# Patient Record
Sex: Female | Born: 1965 | Race: White | Hispanic: No | Smoking: Current every day smoker
Health system: Southern US, Community
[De-identification: ages and names within clinical notes are randomized; demographics above are authoritative.]

## PROBLEM LIST (undated history)

## (undated) DIAGNOSIS — I10 Essential (primary) hypertension: Secondary | ICD-10-CM

## (undated) DIAGNOSIS — J40 Bronchitis, not specified as acute or chronic: Secondary | ICD-10-CM

## (undated) DIAGNOSIS — R748 Abnormal levels of other serum enzymes: Secondary | ICD-10-CM

## (undated) DIAGNOSIS — J449 Chronic obstructive pulmonary disease, unspecified: Secondary | ICD-10-CM

## (undated) DIAGNOSIS — I219 Acute myocardial infarction, unspecified: Secondary | ICD-10-CM

## (undated) DIAGNOSIS — I2510 Atherosclerotic heart disease of native coronary artery without angina pectoris: Secondary | ICD-10-CM

## (undated) DIAGNOSIS — F32A Depression, unspecified: Secondary | ICD-10-CM

## (undated) DIAGNOSIS — G8929 Other chronic pain: Secondary | ICD-10-CM

## (undated) DIAGNOSIS — J45909 Unspecified asthma, uncomplicated: Secondary | ICD-10-CM

## (undated) DIAGNOSIS — I471 Supraventricular tachycardia, unspecified: Secondary | ICD-10-CM

## (undated) DIAGNOSIS — R0789 Other chest pain: Secondary | ICD-10-CM

## (undated) DIAGNOSIS — I779 Disorder of arteries and arterioles, unspecified: Secondary | ICD-10-CM

## (undated) DIAGNOSIS — R002 Palpitations: Secondary | ICD-10-CM

## (undated) DIAGNOSIS — K7460 Unspecified cirrhosis of liver: Secondary | ICD-10-CM

## (undated) DIAGNOSIS — I2699 Other pulmonary embolism without acute cor pulmonale: Secondary | ICD-10-CM

## (undated) DIAGNOSIS — Z720 Tobacco use: Secondary | ICD-10-CM

## (undated) DIAGNOSIS — C189 Malignant neoplasm of colon, unspecified: Secondary | ICD-10-CM

## (undated) DIAGNOSIS — F329 Major depressive disorder, single episode, unspecified: Secondary | ICD-10-CM

## (undated) DIAGNOSIS — C801 Malignant (primary) neoplasm, unspecified: Secondary | ICD-10-CM

## (undated) DIAGNOSIS — I480 Paroxysmal atrial fibrillation: Secondary | ICD-10-CM

## (undated) DIAGNOSIS — E785 Hyperlipidemia, unspecified: Secondary | ICD-10-CM

## (undated) DIAGNOSIS — B1910 Unspecified viral hepatitis B without hepatic coma: Secondary | ICD-10-CM

## (undated) DIAGNOSIS — F419 Anxiety disorder, unspecified: Secondary | ICD-10-CM

## (undated) DIAGNOSIS — K219 Gastro-esophageal reflux disease without esophagitis: Secondary | ICD-10-CM

## (undated) DIAGNOSIS — I639 Cerebral infarction, unspecified: Secondary | ICD-10-CM

## (undated) DIAGNOSIS — R011 Cardiac murmur, unspecified: Secondary | ICD-10-CM

## (undated) DIAGNOSIS — Z9289 Personal history of other medical treatment: Secondary | ICD-10-CM

## (undated) HISTORY — DX: Gastro-esophageal reflux disease without esophagitis: K21.9

## (undated) HISTORY — DX: Cerebral infarction, unspecified: I63.9

## (undated) HISTORY — DX: Major depressive disorder, single episode, unspecified: F32.9

## (undated) HISTORY — DX: Other chronic pain: G89.29

## (undated) HISTORY — DX: Cardiac murmur, unspecified: R01.1

## (undated) HISTORY — DX: Anxiety disorder, unspecified: F41.9

## (undated) HISTORY — DX: Unspecified cirrhosis of liver: K74.60

## (undated) HISTORY — PX: BRONCHOSCOPY: SUR163

## (undated) HISTORY — DX: Unspecified viral hepatitis B without hepatic coma: B19.10

## (undated) HISTORY — DX: Disorder of arteries and arterioles, unspecified: I77.9

## (undated) HISTORY — DX: Other pulmonary embolism without acute cor pulmonale: I26.99

## (undated) HISTORY — DX: Essential (primary) hypertension: I10

## (undated) HISTORY — DX: Chronic obstructive pulmonary disease, unspecified: J44.9

## (undated) HISTORY — PX: APPENDECTOMY: SHX54

## (undated) HISTORY — DX: Supraventricular tachycardia, unspecified: I47.10

## (undated) HISTORY — DX: Hyperlipidemia, unspecified: E78.5

## (undated) HISTORY — DX: Tobacco use: Z72.0

## (undated) HISTORY — DX: Paroxysmal atrial fibrillation: I48.0

## (undated) HISTORY — PX: CARDIAC CATHETERIZATION: SHX172

## (undated) HISTORY — DX: Depression, unspecified: F32.A

## (undated) HISTORY — DX: Unspecified asthma, uncomplicated: J45.909

---
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
## 2004-07-31 ENCOUNTER — Ambulatory Visit: Payer: Self-pay | Admitting: Family Medicine

## 2005-05-13 ENCOUNTER — Ambulatory Visit: Payer: Self-pay | Admitting: Family Medicine

## 2005-05-26 ENCOUNTER — Ambulatory Visit: Payer: Self-pay | Admitting: Family Medicine

## 2005-07-28 ENCOUNTER — Emergency Department (HOSPITAL_COMMUNITY): Admission: EM | Admit: 2005-07-28 | Discharge: 2005-07-29 | Payer: Self-pay | Admitting: Emergency Medicine

## 2006-01-09 ENCOUNTER — Ambulatory Visit: Payer: Self-pay | Admitting: Family Medicine

## 2006-04-14 ENCOUNTER — Inpatient Hospital Stay (HOSPITAL_COMMUNITY): Admission: EM | Admit: 2006-04-14 | Discharge: 2006-04-18 | Payer: Self-pay | Admitting: Emergency Medicine

## 2006-04-14 ENCOUNTER — Ambulatory Visit: Payer: Self-pay | Admitting: Pulmonary Disease

## 2006-04-14 ENCOUNTER — Ambulatory Visit: Payer: Self-pay | Admitting: Family Medicine

## 2006-04-14 ENCOUNTER — Ambulatory Visit: Payer: Self-pay | Admitting: Cardiology

## 2006-04-14 ENCOUNTER — Ambulatory Visit: Payer: Self-pay | Admitting: Infectious Diseases

## 2006-04-15 ENCOUNTER — Encounter: Payer: Self-pay | Admitting: Cardiology

## 2006-04-17 ENCOUNTER — Encounter (INDEPENDENT_AMBULATORY_CARE_PROVIDER_SITE_OTHER): Payer: Self-pay | Admitting: Specialist

## 2006-05-07 ENCOUNTER — Ambulatory Visit: Payer: Self-pay | Admitting: Internal Medicine

## 2006-05-28 ENCOUNTER — Ambulatory Visit: Payer: Self-pay | Admitting: Pulmonary Disease

## 2006-06-12 ENCOUNTER — Ambulatory Visit: Payer: Self-pay | Admitting: Pulmonary Disease

## 2006-06-25 ENCOUNTER — Ambulatory Visit: Payer: Self-pay | Admitting: Pulmonary Disease

## 2006-07-01 ENCOUNTER — Ambulatory Visit: Payer: Self-pay | Admitting: Pulmonary Disease

## 2006-08-18 ENCOUNTER — Ambulatory Visit: Payer: Self-pay | Admitting: Pulmonary Disease

## 2006-08-27 ENCOUNTER — Ambulatory Visit: Payer: Self-pay | Admitting: Pulmonary Disease

## 2006-09-02 ENCOUNTER — Ambulatory Visit: Payer: Self-pay | Admitting: Critical Care Medicine

## 2006-09-02 ENCOUNTER — Inpatient Hospital Stay (HOSPITAL_COMMUNITY): Admission: EM | Admit: 2006-09-02 | Discharge: 2006-09-05 | Payer: Self-pay | Admitting: Emergency Medicine

## 2006-10-08 ENCOUNTER — Ambulatory Visit: Payer: Self-pay | Admitting: Pulmonary Disease

## 2006-12-11 DIAGNOSIS — J310 Chronic rhinitis: Secondary | ICD-10-CM | POA: Insufficient documentation

## 2006-12-11 DIAGNOSIS — Z720 Tobacco use: Secondary | ICD-10-CM

## 2007-03-25 ENCOUNTER — Telehealth (INDEPENDENT_AMBULATORY_CARE_PROVIDER_SITE_OTHER): Payer: Self-pay | Admitting: *Deleted

## 2007-03-29 ENCOUNTER — Telehealth: Payer: Self-pay | Admitting: Critical Care Medicine

## 2007-04-28 ENCOUNTER — Encounter: Payer: Self-pay | Admitting: Pulmonary Disease

## 2007-05-17 ENCOUNTER — Emergency Department (HOSPITAL_COMMUNITY): Admission: EM | Admit: 2007-05-17 | Discharge: 2007-05-18 | Payer: Self-pay | Admitting: Emergency Medicine

## 2007-05-27 ENCOUNTER — Ambulatory Visit: Payer: Self-pay | Admitting: Pulmonary Disease

## 2007-05-27 DIAGNOSIS — J438 Other emphysema: Secondary | ICD-10-CM

## 2007-05-27 DIAGNOSIS — F411 Generalized anxiety disorder: Secondary | ICD-10-CM | POA: Insufficient documentation

## 2007-05-27 DIAGNOSIS — G8929 Other chronic pain: Secondary | ICD-10-CM

## 2007-07-27 ENCOUNTER — Telehealth (INDEPENDENT_AMBULATORY_CARE_PROVIDER_SITE_OTHER): Payer: Self-pay | Admitting: *Deleted

## 2007-07-29 ENCOUNTER — Telehealth (INDEPENDENT_AMBULATORY_CARE_PROVIDER_SITE_OTHER): Payer: Self-pay | Admitting: *Deleted

## 2007-09-17 ENCOUNTER — Telehealth (INDEPENDENT_AMBULATORY_CARE_PROVIDER_SITE_OTHER): Payer: Self-pay | Admitting: *Deleted

## 2007-10-04 ENCOUNTER — Encounter: Payer: Self-pay | Admitting: Pulmonary Disease

## 2007-10-07 ENCOUNTER — Telehealth (INDEPENDENT_AMBULATORY_CARE_PROVIDER_SITE_OTHER): Payer: Self-pay | Admitting: *Deleted

## 2007-10-08 ENCOUNTER — Telehealth: Payer: Self-pay | Admitting: Pulmonary Disease

## 2007-10-12 ENCOUNTER — Ambulatory Visit: Payer: Self-pay | Admitting: Pulmonary Disease

## 2007-10-15 ENCOUNTER — Telehealth: Payer: Self-pay | Admitting: Pulmonary Disease

## 2008-03-22 ENCOUNTER — Telehealth (INDEPENDENT_AMBULATORY_CARE_PROVIDER_SITE_OTHER): Payer: Self-pay | Admitting: *Deleted

## 2008-04-07 ENCOUNTER — Telehealth: Payer: Self-pay | Admitting: Critical Care Medicine

## 2008-04-07 ENCOUNTER — Ambulatory Visit: Payer: Self-pay | Admitting: Pulmonary Disease

## 2008-04-07 DIAGNOSIS — K219 Gastro-esophageal reflux disease without esophagitis: Secondary | ICD-10-CM

## 2008-04-07 DIAGNOSIS — R079 Chest pain, unspecified: Secondary | ICD-10-CM

## 2008-04-11 ENCOUNTER — Telehealth (INDEPENDENT_AMBULATORY_CARE_PROVIDER_SITE_OTHER): Payer: Self-pay | Admitting: *Deleted

## 2008-04-22 ENCOUNTER — Emergency Department (HOSPITAL_COMMUNITY): Admission: EM | Admit: 2008-04-22 | Discharge: 2008-04-22 | Payer: Self-pay | Admitting: Emergency Medicine

## 2008-05-25 ENCOUNTER — Telehealth (INDEPENDENT_AMBULATORY_CARE_PROVIDER_SITE_OTHER): Payer: Self-pay | Admitting: *Deleted

## 2008-08-04 ENCOUNTER — Telehealth: Payer: Self-pay | Admitting: Pulmonary Disease

## 2008-08-29 ENCOUNTER — Telehealth (INDEPENDENT_AMBULATORY_CARE_PROVIDER_SITE_OTHER): Payer: Self-pay | Admitting: *Deleted

## 2008-09-07 ENCOUNTER — Ambulatory Visit: Payer: Self-pay | Admitting: Pulmonary Disease

## 2008-09-19 ENCOUNTER — Emergency Department (HOSPITAL_COMMUNITY): Admission: EM | Admit: 2008-09-19 | Discharge: 2008-09-19 | Payer: Self-pay | Admitting: Emergency Medicine

## 2008-09-28 ENCOUNTER — Telehealth (INDEPENDENT_AMBULATORY_CARE_PROVIDER_SITE_OTHER): Payer: Self-pay | Admitting: *Deleted

## 2008-10-20 ENCOUNTER — Encounter: Payer: Self-pay | Admitting: Pulmonary Disease

## 2009-04-12 ENCOUNTER — Telehealth: Payer: Self-pay | Admitting: Pulmonary Disease

## 2009-04-25 ENCOUNTER — Ambulatory Visit: Payer: Self-pay | Admitting: Pulmonary Disease

## 2009-06-05 ENCOUNTER — Telehealth (INDEPENDENT_AMBULATORY_CARE_PROVIDER_SITE_OTHER): Payer: Self-pay | Admitting: *Deleted

## 2009-06-07 ENCOUNTER — Telehealth: Payer: Self-pay | Admitting: Pulmonary Disease

## 2009-06-18 ENCOUNTER — Telehealth (INDEPENDENT_AMBULATORY_CARE_PROVIDER_SITE_OTHER): Payer: Self-pay | Admitting: *Deleted

## 2009-06-18 ENCOUNTER — Telehealth: Payer: Self-pay | Admitting: Pulmonary Disease

## 2009-08-03 ENCOUNTER — Telehealth (INDEPENDENT_AMBULATORY_CARE_PROVIDER_SITE_OTHER): Payer: Self-pay | Admitting: *Deleted

## 2009-08-30 ENCOUNTER — Telehealth: Payer: Self-pay | Admitting: Pulmonary Disease

## 2009-10-26 ENCOUNTER — Ambulatory Visit: Payer: Self-pay | Admitting: Pulmonary Disease

## 2009-10-26 ENCOUNTER — Encounter: Payer: Self-pay | Admitting: Pulmonary Disease

## 2009-12-04 ENCOUNTER — Telehealth: Payer: Self-pay | Admitting: Pulmonary Disease

## 2010-01-14 ENCOUNTER — Encounter: Payer: Self-pay | Admitting: Pulmonary Disease

## 2010-01-14 ENCOUNTER — Ambulatory Visit: Payer: Self-pay | Admitting: Pulmonary Disease

## 2010-01-14 LAB — CONVERTED CEMR LAB
Anti Nuclear Antibody(ANA): NEGATIVE
IgE (Immunoglobulin E), Serum: 11.9 intl units/mL (ref 0.0–180.0)

## 2010-01-16 ENCOUNTER — Telehealth: Payer: Self-pay | Admitting: Pulmonary Disease

## 2010-01-18 ENCOUNTER — Ambulatory Visit: Payer: Self-pay | Admitting: Cardiology

## 2010-01-29 ENCOUNTER — Encounter: Payer: Self-pay | Admitting: Pulmonary Disease

## 2010-01-29 LAB — CONVERTED CEMR LAB
Albumin: 4.5 g/dL (ref 3.5–5.2)
Alkaline Phosphatase: 53 units/L (ref 39–117)
Basophils Absolute: 0 10*3/uL (ref 0.0–0.1)
Bilirubin, Direct: 0 mg/dL (ref 0.0–0.3)
CO2: 22 meq/L (ref 19–32)
Calcium: 10.1 mg/dL (ref 8.4–10.5)
Creatinine, Ser: 0.8 mg/dL (ref 0.4–1.2)
Eosinophils Absolute: 0.1 10*3/uL (ref 0.0–0.7)
GFR calc non Af Amer: 83.84 mL/min (ref >60.00)
Glucose, Bld: 100 mg/dL — ABNORMAL HIGH (ref 70–99)
Lymphocytes Relative: 20.7 % (ref 12.0–46.0)
MCHC: 34.3 g/dL (ref 30.0–36.0)
Monocytes Relative: 7.7 % (ref 3.0–12.0)
Neutrophils Relative %: 70.6 % (ref 43.0–77.0)
Nitrite: NEGATIVE
Platelets: 286 10*3/uL (ref 150.0–400.0)
RDW: 13.9 % (ref 11.5–14.6)
Specific Gravity, Urine: 1.015 (ref 1.000–1.030)
Total Bilirubin: 0.4 mg/dL (ref 0.3–1.2)
Total Protein, Urine: NEGATIVE mg/dL
pH: 5.5 (ref 5.0–8.0)

## 2010-01-31 ENCOUNTER — Telehealth (INDEPENDENT_AMBULATORY_CARE_PROVIDER_SITE_OTHER): Payer: Self-pay | Admitting: *Deleted

## 2010-02-05 ENCOUNTER — Telehealth (INDEPENDENT_AMBULATORY_CARE_PROVIDER_SITE_OTHER): Payer: Self-pay | Admitting: *Deleted

## 2010-02-11 ENCOUNTER — Ambulatory Visit
Admission: RE | Admit: 2010-02-11 | Discharge: 2010-02-11 | Payer: Self-pay | Source: Home / Self Care | Attending: Pulmonary Disease | Admitting: Pulmonary Disease

## 2010-02-21 ENCOUNTER — Telehealth: Payer: Self-pay | Admitting: Pulmonary Disease

## 2010-02-22 ENCOUNTER — Ambulatory Visit: Admit: 2010-02-22 | Payer: Self-pay | Admitting: Internal Medicine

## 2010-02-24 ENCOUNTER — Encounter: Payer: Self-pay | Admitting: Pulmonary Disease

## 2010-02-25 ENCOUNTER — Telehealth: Payer: Self-pay | Admitting: Internal Medicine

## 2010-03-01 ENCOUNTER — Telehealth: Payer: Self-pay | Admitting: Pulmonary Disease

## 2010-03-05 NOTE — Progress Notes (Signed)
Summary: rx  Phone Note Call from Patient Call back at [phone]   Caller: Patient Call For: Awanda Wilcock Reason for Call: Refill Medication, Talk to Nurse Summary of Call: pt would like to get a refill on her Tramadol for cough.  She has appt in March. [facility] Initial call taken by: Eugene Gavia,  April 12, 2009 11:56 AM  Follow-up for Phone Call        refill sent. pt advised must keep march appt for future refills. Carron Curie CMA  April 12, 2009 12:19 PM     New/Updated Medications: TRAMADOL HCL 50 MG TABS (TRAMADOL HCL) 2 tabs every 6 hours as needed Prescriptions: TRAMADOL HCL 50 MG TABS (TRAMADOL HCL) 2 tabs every 6 hours as needed  #30 x 0   Entered by:   Carron Curie CMA   Authorized by:   Coralyn Helling MD   Signed by:   Carron Curie CMA on 04/12/2009   Method used:   Electronically to        [facility] [phone]* (retail)       [address]       [place], [place]  [address]       Ph: [phone] or [phone]       Fax: [phone]   RxID:   [id number]

## 2010-03-05 NOTE — Progress Notes (Signed)
Summary: Refill request for Symbicort denial  Phone Note Refill Request Message from:  Fax from Pharmacy  Refills Requested: Medication #1:  SYMBICORT 160-4.5 MCG/ACT AERO two puffs two times a day   Last Refilled: 08/03/2009   Notes: #10.0 [facility] [address], [place] phone [phone] fax [phone] Pt No Showed last 2 scheduled appointments with [facility], Pt needs to come in for OV. I called to schedule appointment for pt. Pt would not come to phone having family member advise me she was sleep. Family member states he will have her call when she wakes up. Medication refill request denied at time. Faxed sent to pharmacy stating same. Zackery Barefoot CMA  August 30, 2009 12:18 PM

## 2010-03-05 NOTE — Progress Notes (Signed)
Summary: refill benzonatate  Phone Note Refill Request Call back at [facility] from:  Fax from Pharmacy on December 04, 2009 12:24 PM  Refills Requested: Medication #1:  BENZONATATE 200 MG CAPS one by mouth three times a day as needed.   Dosage confirmed as above?Dosage Confirmed   Supply Requested: 10days   Last Refilled: 11/04/2009 last seen by VS 9.23.11, next ov 12.12.11.  Dr. Craige Cotta, may this med be filled?  Initial call taken by: Boone Master CNA/MA,  December 04, 2009 12:25 PM  Follow-up for Phone Call        [facility] to refill.  Give 60 pills with 2 refills. Follow-up by: Coralyn Helling MD,  December 04, 2009 12:45 PM    Prescriptions: BENZONATATE 200 MG CAPS (BENZONATATE) one by mouth three times a day as needed  #60 x 2   Entered by:   Gweneth Dimitri RN   Authorized by:   Coralyn Helling MD   Signed by:   Gweneth Dimitri RN on 12/04/2009   Method used:   Electronically to        [facility] [phone]* (retail)       [address]       [place], [place]  [address]       Ph: [phone] or [phone]       Fax: [phone]   RxID:   [id number]

## 2010-03-05 NOTE — Progress Notes (Signed)
Summary: pain and dyspnea- advised call EMS  Phone Note Call from Patient   Caller: Patient Call For: SOOD Summary of Call: pt c/o lung "pain". wants to speak to nurse. i offered an appt tomorrow but pt says her car is in the shop. wanted "just to talk to nurse about this asap. [phone] Initial call taken by: Tivis Ringer, CNA,  Jun 18, 2009 5:15 PM  Follow-up for Phone Call        Called and spoke with pt.  She is c/o "lung pain"- states that the pain is severe and has been worsening over the past several days.  She states that she has also had some prod cough with green sputum and increased SOB.  Pt sounded in distress over the phone, crying, and having to stop and take breaks between words.  I advised needs eval asap.  Pt unable to come in for appt and has no showed for past 2 appts due to transportation issues.  I advised that she call 911 immediatley so that she can be evaluated.  Pt verbalized understanding and states that she will call  for EMS.  I advised her also that as soon as she is able to keep appt, she should call for ov here with [facility]. Follow-up by: Vernie Murders,  Jun 18, 2009 5:23 PM

## 2010-03-05 NOTE — Assessment & Plan Note (Signed)
Summary: rov/apc   Primary Provider/Referring Provider:  Dr. Lysbeth Galas  CC:  COPD.  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  She has been getting more cough.  Her breathing has gotten worse.  She says she is not smoking anymore.  She is still using bupropion.  She occasionally brings up sputum, and had some blood in her sputum last week.  She denies fever, but has been getting sweats.  She denies rashes or leg swelling.  She is using ventolin three times a day, and duoneb three times a day.  She is also using spriva.  CXR  Procedure date:  04/25/2009  Findings:       CHEST - 2 VIEW    Comparison: 09/19/2008    Findings: Heart size is normal.  The mediastinum is unremarkable.   Lungs are clear.  No effusions.  No bony abnormalities.    IMPRESSION:   Normal chest   Preventive Screening-Counseling & Management  Alcohol-Tobacco     Smoking Status: quit  Current Medications (verified): 1)  Symbicort 160-4.5 Mcg/act Aero (Budesonide-Formoterol Fumarate) .... Two Puffs Two Times A Day 2)  Singulair 10 Mg  Tabs (Montelukast Sodium) .... Take 1 Tab By Mouth At Bedtime 3)  Ventolin Hfa 108 (90 Base) Mcg/act Aers (Albuterol Sulfate) .... 2 Puffs Up To Four Times Daily As Needed 4)  Duoneb 2.5-0.5 Mg/81ml  Soln (Albuterol-Ipratropium) .... Three Times A Day 5)  Buproban 150 Mg Xr12h-Tab (Bupropion Hcl (Smoking Deter)) .... One By Mouth Once Daily For 3 Days, Then One By Mouth Two Times A Day 6)  Alprazolam 0.25 Mg Tabs (Alprazolam) .Marland Kitchen.. 1 By Mouth Two Times A Day As Needed Anxiety 7)  Tramadol Hcl 50 Mg Tabs (Tramadol Hcl) .... 2 Tabs Every 6 Hours As Needed 8)  Spiriva Handihaler 18 Mcg Caps (Tiotropium Bromide Monohydrate) .... Once Daily  Allergies (verified): 1)  ! Pcn  Past History:  Past Medical History: Reviewed history  from 05/27/2007 and no changes required. HTN COPD/Emphysema GERD Chronic Pain Tobacco Abuse Anxiety Echo 04/15/06 EF 65% with normal LV function  Past Surgical History: Reviewed history from 05/27/2007 and no changes required. Bronchoscopy 04/17/06  Vital Signs:  Patient profile:   45 year old female Height:      63 inches (160.02 cm) Weight:      140 pounds (63.64 kg) BMI:     24.89 O2 Sat:      100 % on Room air Temp:     98.6 degrees F (37.00 degrees C) oral Pulse rate:   89 / minute BP sitting:   128 / 74  (left arm) Cuff size:   regular  Vitals Entered By: Michel Bickers CMA (April 25, 2009 2:14 PM)  O2 Sat at Rest %:  100 O2 Flow:  Room air CC: COPD.  The patient c/o increased sob with exertion and at rest and prod and non-prod cough for 2 weeks.   Physical Exam  Nose:  no sinus tenderness, no nasal discharge Mouth:  no oral lesion Neck:  no JVD.  Tender on palpation over trapezius Chest Wall:  tender on palpation over clavicles Lungs:  prolonged exhalation, no wheezing or rales Heart:  regular rhythm, normal rate, and no murmurs.   Extremities:  minimal ankle edema Cervical Nodes:  no significant adenopathy Psych:  anxious.  Impression & Recommendations:  Problem # 1:  HEMOPTYSIS UNSPECIFIED (ICD-786.30) She likely has an acute bronchitis.  I will give her a course of antibiotics.  Her CXR was unremarkable.  I think her chest discomfort is related to her coughing.  Problem # 2:  COPD (ICD-496)  She is to continue on spiriva, and symbicort.  She is to continue as needed proair.  Since she is on spiriva, will stop duoneb and just have her use albuterol in her nebulizer.  Problem # 3:  TOBACCO ABUSE (ICD-305.1) Will continue bupropion for now.  This may also be helping her anxiety and depression to some degree.  Medications Added to Medication List This Visit: 1)  Spiriva Handihaler 18 Mcg Caps (Tiotropium bromide monohydrate) .... Once daily 2)   Albuterol Sulfate (2.5 Mg/59ml) 0.083% Nebu (Albuterol sulfate) .... One vial nebulized four times per day 3)  Zithromax Z-pak 250 Mg Tabs (Azithromycin) .... Use as directed  Complete Medication List: 1)  Spiriva Handihaler 18 Mcg Caps (Tiotropium bromide monohydrate) .... Once daily 2)  Symbicort 160-4.5 Mcg/act Aero (Budesonide-formoterol fumarate) .... Two puffs two times a day 3)  Singulair 10 Mg Tabs (Montelukast sodium) .... Take 1 tab by mouth at bedtime 4)  Ventolin Hfa 108 (90 Base) Mcg/act Aers (Albuterol sulfate) .... 2 puffs up to four times daily as needed 5)  Albuterol Sulfate (2.5 Mg/14ml) 0.083% Nebu (Albuterol sulfate) .... One vial nebulized four times per day 6)  Buproban 150 Mg Xr12h-tab (Bupropion hcl (smoking deter)) .... One by mouth once daily for 3 days, then one by mouth two times a day 7)  Alprazolam 0.25 Mg Tabs (Alprazolam) .Marland Kitchen.. 1 by mouth two times a day as needed anxiety 8)  Tramadol Hcl 50 Mg Tabs (Tramadol hcl) .... 2 tabs every 6 hours as needed 9)  Zithromax Z-pak 250 Mg Tabs (Azithromycin) .... Use as directed  Other Orders: T-2 View CXR (71020TC) Est. Patient Level IV (29562)  Patient Instructions: 1)  Zithromax 250 mg pills: 2 pills on first day, then 1 pill once daily for 4 days 2)  Change duoneb to albuterol in nebulizer, and can use up to four times per day 3)  Follow up in 2 to 3 months Prescriptions: ZITHROMAX Z-PAK 250 MG TABS (AZITHROMYCIN) use as directed  #1 x 0   Entered and Authorized by:   Coralyn Helling MD   Signed by:   Coralyn Helling MD on 04/25/2009   Method used:   Electronically to        [facility] [phone]* (retail)       [address]       [place], [place]  [address]       Ph: [phone] or [phone]       Fax: [phone]   RxID:   [id number] ALBUTEROL SULFATE (2.5 MG/3ML) 0.083% NEBU (ALBUTEROL SULFATE) one vial nebulized four times per day  #120 x 5   Entered and Authorized by:   Coralyn Helling MD   Signed by:   Coralyn Helling MD on 04/25/2009   Method used:   Electronically to        [facility] [phone]* (retail)       [address]       [place], [place]  [address]       Ph: [phone] or [phone]  Fax: [phone]   RxID:   [id number]

## 2010-03-05 NOTE — Progress Notes (Signed)
Summary: DENIED---Tramadol  Phone Note Refill Request Message from:  Fax from Pharmacy on August 03, 2009 4:10 PM  Refills Requested: Medication #1:  TRAMADOL HCL 50 MG TABS 2 tabs every 6 hours as needed   Dosage confirmed as above?Dosage Confirmed   Brand Name Necessary? No   Supply Requested: 1 month   Last Refilled: 07/04/2009   [facility] [place]  Method Requested: Electronic Next Appointment Scheduled: no pending appt Initial call taken by: Michel Bickers CMA,  August 03, 2009 4:12 PM  Follow-up for Phone Call        Rx denied because the patient needs an appointment. Form faxed back to the pharmacy with denial. Follow-up by: Michel Bickers CMA,  August 03, 2009 4:16 PM

## 2010-03-05 NOTE — Progress Notes (Signed)
Summary: nos appt  Phone Note Call from Patient   Caller: [email or web address]  Call For: Seila Liston Summary of Call: LMTCB x 2 for nos from 5/13. Initial call taken by: Darletta Moll,  Jun 18, 2009 3:09 PM

## 2010-03-05 NOTE — Progress Notes (Signed)
Summary: rsc nos appt  Phone Note Call from Patient   Caller: [email or web address]  Call For: Florenda Watt Summary of Call: Rsc 5/4 nos to 5/13 @ 10:45a. Initial call taken by: Darletta Moll,  Jun 07, 2009 3:27 PM

## 2010-03-05 NOTE — Progress Notes (Signed)
Summary: pain in lungs  Phone Note Call from Patient Call back at [phone]   Caller: Patient Call For: sood Reason for Call: Talk to Nurse Summary of Call: pt says her lungs hurt, x2 weeks.  Says since she quit smoking, hurt more, coughing really bad, congestion in lungs, can't get it up.   Initial call taken by: Eugene Gavia,  Jun 05, 2009 12:01 PM  Follow-up for Phone Call        called and spoke with pt.  offered her an appt today.  pt declined stating she has no ride.  offered appt tomorrow.  pt agreed.  scheduled pt to see VS tomorrow at 2:30pm.  Arman Filter LPN  Jun 05, 2540 12:17 PM

## 2010-03-05 NOTE — Assessment & Plan Note (Signed)
Summary: rov/per pt request/apc   Visit Type:  Follow-up Primary Solae Norling/Referring Makaylyn Sinyard:  Dr. Lysbeth Galas  CC:  COPD follow-up...the patient c/o increased SOB with exertion and at rest...dry cough...still smoking 1/4 ppd.  History of Present Illness: 46 year old hx of COPD/Emphysema, rhinitis, and continued tobacco abuse.  She has been getting more short of breath.  This has been worse when exposed to cleaning products, and strong fumes.  She has been trying to walk in the park, but gets winded after walking 100 ft.  She has a cough which is usually dry.  She occasional brings up some sputum with blood mixed in.  She has been getting wheeze in her chest.  She feels like it is hard to get air into her lungs, and gets tightness in her chest.  She denies fever.  She as been getting some nausea.  She denies skin rash or sinus congestion.  She has been getting some swelling in her ankles.  She uses ventolin 4 to 5 times per day.  She has been using her nebulizer at night.  She has more trouble with her breathing at night.  She is still smoking 5 to 6 cigarettes per day.    CXR  Procedure date:  10/26/2009  Findings:      CHEST - 2 VIEW   Comparison: 04/25/2009   Findings: The heart size and mediastinal contours are within normal limits.  Lung volumes are stable.  No edema, infiltrate or nodule. Stable mild interstitial prominence.  The visualized skeletal structures are unremarkable.   IMPRESSION: No active disease.  Stable mild interstitial prominence.   Preventive Screening-Counseling & Management  Alcohol-Tobacco     Smoking Status: current     Packs/Day: 0.25  Current Medications (verified): 1)  Spiriva Handihaler 18 Mcg Caps (Tiotropium Bromide Monohydrate) .... Once Daily 2)  Symbicort 160-4.5 Mcg/act Aero (Budesonide-Formoterol Fumarate) .... Two Puffs Two Times A Day 3)  Singulair 10 Mg  Tabs (Montelukast Sodium) .... Take 1 Tab By Mouth At Bedtime 4)  Ventolin Hfa 108  (90 Base) Mcg/act Aers (Albuterol Sulfate) .... 2 Puffs Up To Four Times Daily As Needed 5)  Albuterol Sulfate (2.5 Mg/74ml) 0.083% Nebu (Albuterol Sulfate) .... One Vial Nebulized Four Times Per Day 6)  Buproban 150 Mg Xr12h-Tab (Bupropion Hcl (Smoking Deter)) .... One By Mouth Once Daily For 3 Days, Then One By Mouth Two Times A Day 7)  Alprazolam 0.25 Mg Tabs (Alprazolam) .Marland Kitchen.. 1 By Mouth Two Times A Day As Needed Anxiety  Allergies (verified): 1)  ! Pcn  Past History:  Past Medical History: HTN COPD/Emphysema      - Spirometry 10/26/09 FEV1 2.59(88%), FEV1% 73 GERD Chronic Pain Tobacco Abuse Anxiety Echo 04/15/06 EF 65% with normal LV function  Past Surgical History: Reviewed history from 05/27/2007 and no changes required. Bronchoscopy 04/17/06  Vital Signs:  Patient profile:   45 year old female Height:      63 inches (160.02 cm) Weight:      125 pounds (56.82 kg) BMI:     22.22 O2 Sat:      97 % on Room air Temp:     98.2 degrees F (36.78 degrees C) oral Pulse rate:   82 / minute BP sitting:   130 / 78  (left arm) Cuff size:   regular  Vitals Entered By: Michel Bickers CMA (October 26, 2009 2:57 PM)  O2 Sat at Rest %:  97 O2 Flow:  Room air   Social History:  Smoking Status:  current Packs/Day:  0.25  CC: COPD follow-up...the patient c/o increased SOB with exertion and at rest...dry cough...still smoking 1/4 ppd Comments Medications reviewed with patient Michel Bickers [facility]  October 26, 2009 3:04 PM   Physical Exam  General:  normal appearance and thin.   Nose:  no sinus tenderness, no nasal discharge Mouth:  no oral lesion Neck:  no JVD.  Tender on palpation over trapezius Lungs:  prolonged exhalation, faint wheezing, no rales Heart:  regular rhythm, normal rate, and no murmurs.   Abdomen:  soft, nontender Pulses:  pulses normal Extremities:  minimal ankle edema Neurologic:  normal CN II-XII and strength normal.   Cervical Nodes:  no significant  adenopathy Psych:  anxious.     Impression & Recommendations:  Problem # 1:  HEMOPTYSIS UNSPECIFIED (ICD-786.30) Her chest xray was unremarkable for acute process.  Will treat her for an acute bronchitis.  Will give her a course of prednisone and doxycycline.  Will give her benzonatate for her cough.  Problem # 2:  COPD (ICD-496) She is to continue her current inhaler regimen and singulair.  Will re-assess her inhaler use at next visit.  Explained to her that much of her current symptoms are related to continued tobacco abuse.  Problem # 3:  TOBACCO ABUSE (ICD-305.1) Again explained to her the need to stop smoking completely.  Medications Added to Medication List This Visit: 1)  Prednisone 10 Mg Tabs (Prednisone) .... 3 pills for 2 days, 2 pills for 2 days, 1 pill for 2 days, 1/2 pill for 2 days 2)  Doxycycline Hyclate 100 Mg Caps (Doxycycline hyclate) .... One by mouth two times a day 3)  Benzonatate 200 Mg Caps (Benzonatate) .... One by mouth three times a day as needed  Complete Medication List: 1)  Spiriva Handihaler 18 Mcg Caps (Tiotropium bromide monohydrate) .... Once daily 2)  Symbicort 160-4.5 Mcg/act Aero (Budesonide-formoterol fumarate) .... Two puffs two times a day 3)  Singulair 10 Mg Tabs (Montelukast sodium) .... Take 1 tab by mouth at bedtime 4)  Ventolin Hfa 108 (90 Base) Mcg/act Aers (Albuterol sulfate) .... 2 puffs up to four times daily as needed 5)  Albuterol Sulfate (2.5 Mg/41ml) 0.083% Nebu (Albuterol sulfate) .... One vial nebulized four times per day 6)  Buproban 150 Mg Xr12h-tab (Bupropion hcl (smoking deter)) .... One by mouth once daily for 3 days, then one by mouth two times a day 7)  Alprazolam 0.25 Mg Tabs (Alprazolam) .Marland Kitchen.. 1 by mouth two times a day as needed anxiety 8)  Prednisone 10 Mg Tabs (Prednisone) .... 3 pills for 2 days, 2 pills for 2 days, 1 pill for 2 days, 1/2 pill for 2 days 9)  Doxycycline Hyclate 100 Mg Caps (Doxycycline hyclate) .... One by  mouth two times a day 10)  Benzonatate 200 Mg Caps (Benzonatate) .... One by mouth three times a day as needed  Other Orders: Spirometry w/Graph (94010) T-2 View CXR (71020TC) Est. Patient Level IV ([address])  Patient Instructions: 1)  Prednisone 10 mg pills: 3 pills for 2 days, 2 pills for 2 days, 1 pill for 2 days, 1/2 pill for 2 days 2)  Doxycycline 100 mg two times a day for 7 days 3)  Follow up in 3 months Prescriptions: BENZONATATE 200 MG CAPS (BENZONATATE) one by mouth three times a day as needed  #30 x 1   Entered and Authorized by:   Coralyn Helling MD   Signed by:   Laurier Nancy  Sood MD on 10/26/2009   Method used:   Electronically to        [facility] [phone]* (retail)       [address]       [place], [place]  [address]       Ph: [phone] or [phone]       Fax: [phone]   RxID:   [id number] DOXYCYCLINE HYCLATE 100 MG CAPS (DOXYCYCLINE HYCLATE) one by mouth two times a day  #14 x 0   Entered and Authorized by:   Coralyn Helling MD   Signed by:   Coralyn Helling MD on 10/26/2009   Method used:   Electronically to        [facility]  [facility] [phone]* (retail)       [address]       [place], [place]  [address]       Ph: [phone] or [phone]       Fax: [phone]   RxID:   [id number] PREDNISONE 10 MG TABS (PREDNISONE) 3 pills for 2 days, 2 pills for 2 days, 1 pill for 2 days, 1/2 pill for 2 days  #13 x 0   Entered and Authorized by:   Coralyn Helling MD   Signed by:   Coralyn Helling MD on 10/26/2009   Method used:   Electronically to        [facility]  [facility] [phone]* (retail)       [address]       [place], [place]  [address]       Ph: [phone] or [phone]       Fax: [phone]   RxID:   [id number]

## 2010-03-06 ENCOUNTER — Telehealth: Payer: Self-pay | Admitting: Pulmonary Disease

## 2010-03-07 NOTE — Assessment & Plan Note (Signed)
Summary: 3 month f/u ///kp   Visit Type:  Follow-up Primary Provider/Referring Provider:  n/a  CC:  COPD follow up. C/o productive cough with thick mucus ranging from clear to yellow/green with blood. Pt states breathing is the same SOB with activity and at rest worse in the evenings and at night. Rapid weight loss. Still smoking approx 3 cigs a day.  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  She continues to have trouble with her breathing.  She has wheeze, cough and dark yellow sputum.  She intermittently has blood in her sputum.  She denies swallowing trouble.  She has sinus congestion.  She gets nauseous easily with strong smells.  She has been using weight.  She has some chest pain with coughing.  She denies skin rash or gland swelling.  She does get some swelling in her ankles.  She is using ventolin 4 times per day and her nebulizer 4 times per day.  She is smoking 3 cigarettes per day.  Preventive Screening-Counseling & Management  Alcohol-Tobacco     Alcohol drinks/day: 0     Smoking Status: current     Packs/Day: <0.25  Current Medications (verified): 1)  Spiriva Handihaler 18 Mcg Caps (Tiotropium Bromide Monohydrate) .... Once Daily 2)  Symbicort 160-4.5 Mcg/act Aero (Budesonide-Formoterol Fumarate) .... Two Puffs Two Times A Day 3)  Singulair 10 Mg  Tabs (Montelukast Sodium) .... Take 1 Tab By Mouth At Bedtime 4)  Ventolin Hfa 108 (90 Base) Mcg/act Aers (Albuterol Sulfate) .... 2 Puffs Up To Four Times Daily As Needed 5)  Albuterol Sulfate (2.5 Mg/4ml) 0.083% Nebu (Albuterol Sulfate) .... One Vial Nebulized Four Times Per Day 6)  Buproban 150 Mg Xr12h-Tab (Bupropion Hcl (Smoking Deter)) .... Take 1 Tab By Mouth At Bedtime 7)  Alprazolam 0.25 Mg Tabs (Alprazolam) .Marland Kitchen.. 1 By Mouth Two Times A Day As Needed Anxiety  Allergies (verified): 1)  ! Pcn  Past History:  Past Medical History: Reviewed history from  10/26/2009 and no changes required. HTN COPD/Emphysema      - Spirometry 10/26/09 FEV1 2.59(88%), FEV1% 73 GERD Chronic Pain Tobacco Abuse Anxiety Echo 04/15/06 EF 65% with normal LV function  Past Surgical History: Reviewed history from 05/27/2007 and no changes required. Bronchoscopy 04/17/06  Social History: Packs/Day:  <0.25 Alcohol drinks/day:  0  Vital Signs:  Patient profile:   45 year old female Height:      63 inches Weight:      117.6 pounds BMI:     20.91 O2 Sat:      100 % on Room air Temp:     97.8 degrees F oral Pulse rate:   87 / minute BP sitting:   128 / 78  (left arm) Cuff size:   regular  Vitals Entered By: Zackery Barefoot CMA (January 14, 2010 10:14 AM)  O2 Flow:  Room air CC: COPD follow up. C/o productive cough with thick mucus ranging from clear to yellow/green with blood. Pt states breathing is the same SOB with activity and at rest worse in the evenings and at night. Rapid weight loss. Still smoking approx 3 cigs a day Comments Medications reviewed with patient Verified contact number and pharmacy with patient Zackery Barefoot CMA  January 14, 2010 10:15 AM    Physical Exam  General:  normal appearance and thin.   Nose:  clear nasal discharge.   Mouth:  no oral lesion Neck:  no JVD.  Tender on  palpation over trapezius Lungs:  prolonged exhalation, no wheezing, no rales Heart:  regular rhythm, normal rate, and no murmurs.   Abdomen:  soft, nontender Extremities:  minimal ankle edema Neurologic:  normal CN II-XII and strength normal.   Skin:  no rashes Cervical Nodes:  no significant adenopathy Psych:  anxious.     Impression & Recommendations:  Problem # 1:  DYSPNEA (ICD-786.05)  Her dyspnea is out of proportion to her spirometry findings.  Will check CT chest and sinus.  Will check labs.  Problem # 2:  COPD (ICD-496) Will continue her current regimen for now.  Problem # 3:  RHINITIS (ICD-472.0) Will check CT  sinus.  Problem # 4:  TOBACCO ABUSE (ICD-305.1) She is to continue bupropion.  Medications Added to Medication List This Visit: 1)  Buproban 150 Mg Xr12h-tab (Bupropion hcl (smoking deter)) .... Take 1 tab by mouth at bedtime  Complete Medication List: 1)  Spiriva Handihaler 18 Mcg Caps (Tiotropium bromide monohydrate) .... Once daily 2)  Symbicort 160-4.5 Mcg/act Aero (Budesonide-formoterol fumarate) .... Two puffs two times a day 3)  Singulair 10 Mg Tabs (Montelukast sodium) .... Take 1 tab by mouth at bedtime 4)  Ventolin Hfa 108 (90 Base) Mcg/act Aers (Albuterol sulfate) .... 2 puffs up to four times daily as needed 5)  Albuterol Sulfate (2.5 Mg/68ml) 0.083% Nebu (Albuterol sulfate) .... One vial nebulized four times per day 6)  Buproban 150 Mg Xr12h-tab (Bupropion hcl (smoking deter)) .... Take 1 tab by mouth at bedtime 7)  Alprazolam 0.25 Mg Tabs (Alprazolam) .Marland Kitchen.. 1 by mouth two times a day as needed anxiety  Other Orders: Est. Patient Level IV (81191) TLB-BMP (Basic Metabolic Panel-BMET) (80048-METABOL) TLB-CBC Platelet - w/Differential (85025-CBCD) TLB-Hepatic/Liver Function Pnl (80076-HEPATIC) TLB-BNP (B-Natriuretic Peptide) (83880-BNPR) TLB-Sedimentation Rate (ESR) (85652-ESR) T-Allergy Profile Region II-[place], [place], [place], [place], [place] [phone]) T-Angiotensin i-Converting Enzyme [phone]) T-Antinuclear Antib (ANA) [phone]) T-Rheumatoid Factor [phone]) T- * Misc. Laboratory test [phone]) Radiology Referral (Radiology) TLB-Udip ONLY (81003-UDIP)  Patient Instructions: 1)  Lab test today 2)  Will check CT chest and sinus 3)  Follow up in 4 weeks   Not Administered:    Influenza Vaccine not given due to: declined

## 2010-03-07 NOTE — Progress Notes (Signed)
Summary: chest pain, arm numbness> advised 911  Phone Note Call from Patient   Caller: Son- jeremy Wessner Call For: sood Summary of Call: pt c/o numbness in L arm since this morning as well as lips that "were blue and now are white- palish color". also chest pains "off and on for several days". i advised er or 911 and not to wait for callback; however he requests to speak to nurse. [phone] Initial call taken by: Tivis Ringer, CNA,  January 16, 2010 5:03 PM  Follow-up for Phone Call        spoke with pt son and he states that since sunday the pt has had chest pain and today she is c/o her left arm being numb and her lips are a "whitish-pale" color. I asked if she was talkin got him and he said yes she was responding. I advised him to call 911 and get her to ED to be evaluated. He states he will call 911 now. Carron Curie CMA  January 16, 2010 5:10 PM

## 2010-03-07 NOTE — Progress Notes (Signed)
Summary: results  Phone Note Call from Patient Call back at Home Phone [phone]   Caller: Patient Call For: Dr. Craige Cotta Summary of Call: Patient phoned stated taht she received a letter informing her that we had her test results and that we could not reach her and for her to call the office.  Patient can be reached [phone] Initial call taken by: Vedia Coffer,  January 31, 2010 1:59 PM  Follow-up for Phone Call        Called, spoke with pt.  She was informed of CT Chest results and recs per append from 01/18/10 by Dr. Craige Cotta.  She verbalized understanding. Follow-up by: Gweneth Dimitri RN,  January 31, 2010 3:50 PM

## 2010-03-07 NOTE — Progress Notes (Signed)
Summary: SPRIVIA TOO HIGH  Phone Note Call from Patient Call back at [phone]   Caller: Patient Call For: SOOD Summary of Call: PT S MEDICAID WILL NOT COVER HER SPRIVIA ANYMORE IS THERE SOMETHING ELSE SHE CAN SWITCH [facility] [place] Initial call taken by: Lacinda Axon,  February 05, 2010 9:58 AM  Follow-up for Phone Call        called and spoke with pt.  pt states her ins (Medicaid) will no longer cover Spiriva.  Pt has only 2 days left before she runs out.  Would like VS' recs regarding what med she can be switched to.  Also, pt c/o coughing up thick white to yellow to dark green colored sputum, states it's difficult to cough sputum up.  also c/o increased sob with exertion and at night, tightness in chest.  Pt denies fever.  Symptoms started 2 weeks ago.  She states she is using rescue hfa and nebs but still c/o increased sob.  WIll forward message to VS to address.  Aundra Millet Reynolds LPN  February 05, 2010 11:54 AM   Additional Follow-up for Phone Call Additional follow up Details #1::        have her stop spiriva and ventolin.  she can use combivent two puffs qid as needed.  dispense one with 6 refills. Additional Follow-up by: Coralyn Helling MD,  February 05, 2010 1:30 PM    Additional Follow-up for Phone Call Additional follow up Details #2::    Pt aware rx sent to pharmacy. Follow-up by: Jerolyn Shin,  February 05, 2010 2:20 PM  New/Updated Medications: COMBIVENT 18-103 MCG/ACT AERO (IPRATROPIUM-ALBUTEROL) 2 Puffs qid prn Prescriptions: COMBIVENT 18-103 MCG/ACT AERO (IPRATROPIUM-ALBUTEROL) 2 Puffs qid prn  #1 x 6   Entered by:   Jerolyn Shin   Authorized by:   Coralyn Helling MD   Signed by:   Jerolyn Shin on 02/05/2010   Method used:   [facility]        [facility] [phone]* (retail)       [address]       [place], [place]  [address]       Ph: [phone] or [phone]       Fax: [phone]   RxID:   [id number]

## 2010-03-07 NOTE — Letter (Signed)
Summary: [facility] Results Follow Up Letter  [facility]  [address]   [place], [place] [address]   Phone: [phone]  Fax: [phone]    01/29/2010 MRN: [id number]  Norva Arciniega [address] [place], [place]  [address]  Dear Ms. Fluharty,  We have received the results from your recent tests and have been unable to contact you.  Please call our office at [phone] so that Dr.__SOOD__ or his nurse may review the results with you.    Thank you,  Nature conservation officer Pulmonary Division

## 2010-03-07 NOTE — Progress Notes (Signed)
Summary: rx request / cough  Phone Note Call from Patient Call back at [phone]   Caller: Patient Call For: Sheffield Hawker Summary of Call: pt says cough has worsened since last ov. wants rx for robitussin w/ codeine. also wants nasonex. [facility] [place] [phone]. pt ph# [phone] Initial call taken by: Tivis Ringer, CNA,  February 21, 2010 2:09 PM  Follow-up for Phone Call        [facility]- Vernie Murders  February 21, 2010 3:23 PM  Pt c/o worsening cough that is non-prod, slight incr SOB, chest tightness and sore throat. She denies any wheezing, fever or nausea.Michel Bickers [facility]  February 21, 2010 5:29 PM   Additional Follow-up for Phone Call Additional follow up Details #1::        Per Dr. Craige Cotta, pt will need OV. He is not in the office tomorrow and will need to see one of the other providers or TP. If she does not want to come in, she should go to the ER per VS.Pt agreed to come in for OV w/ MW on Fri., 02/22/2010 @ 11:45am. She will fo to the closest ER tonight if her sxs get worse.Michel Bickers [facility]  February 21, 2010 5:36 PM  Additional Follow-up by: Michel Bickers CMA,  February 21, 2010 5:36 PM

## 2010-03-07 NOTE — Assessment & Plan Note (Signed)
Summary: rov 4 wks ///kp   Visit Type:  Follow-up Primary Provider/Referring Provider:  n/a  CC:  Patient here for follow-up on CT chest and CT sinus...patient c/o incr SOB w/ exertion and cough w/ yellow sputum...still smoking 1/4 to 1/2 packs daily.  History of Present Illness: 45 year old hx of /Emphysema, rhinitis, and continued tobacco abuse.  She continues to smoke 3 to 4 cigarettes per day.  She still has cough with sputum.  She however is not coughing up blood anymore.  She continued sinus congestion.  She hurts all over.  She feels her inhalers due help her some.  Her recent lab work was negative.  Her CT chest was unremarkable except for mild emphysema changes.  Preventive Screening-Counseling & Management  Alcohol-Tobacco     Smoking Status: current     Packs/Day: 0.25  Current Medications (verified): 1)  Spiriva Handihaler 18 Mcg Caps (Tiotropium Bromide Monohydrate) .... Once Daily 2)  Symbicort 160-4.5 Mcg/act Aero (Budesonide-Formoterol Fumarate) .... Two Puffs Two Times A Day 3)  Singulair 10 Mg  Tabs (Montelukast Sodium) .... Take 1 Tab By Mouth At Bedtime 4)  Ventolin Hfa 108 (90 Base) Mcg/act Aers (Albuterol Sulfate) .... 2 Puffs Up To Four Times Daily As Needed 5)  Albuterol Sulfate (2.5 Mg/61ml) 0.083% Nebu (Albuterol Sulfate) .... One Vial Nebulized Four Times Per Day 6)  Buproban 150 Mg Xr12h-Tab (Bupropion Hcl (Smoking Deter)) .... Take 1 Tab By Mouth At Bedtime 7)  Alprazolam 0.25 Mg Tabs (Alprazolam) .Marland Kitchen.. 1 By Mouth Two Times A Day As Needed Anxiety 8)  Combivent 18-103 Mcg/act Aero (Ipratropium-Albuterol) .... 2 Puffs Qid Prn  Allergies (verified): 1)  ! Pcn  Past History:  Past Medical History: HTN Emphysema      - Spirometry 10/26/09 FEV1 2.59(88%), FEV1% 73 GERD Chronic Pain Tobacco Abuse Anxiety Echo 04/15/06 EF 65% with normal LV function  Past Surgical History: Reviewed history from 05/27/2007 and no changes required. Bronchoscopy  04/17/06  Social History: Packs/Day:  0.25  Vital Signs:  Patient profile:   45 year old female Height:      63 inches (160.02 cm) Weight:      119 pounds (54.09 kg) BMI:     21.16 O2 Sat:      98 % on Room air Temp:     97.8 degrees F (36.56 degrees C) oral Pulse rate:   84 / minute BP sitting:   140 / 90  (left arm) Cuff size:   regular  Vitals Entered By: Michel Bickers CMA (February 11, 2010 11:06 AM)  O2 Sat at Rest %:  98 O2 Flow:  Room air CC: Patient here for follow-up on CT chest and CT sinus...patient c/o incr SOB w/ exertion, cough w/ yellow sputum...still smoking 1/4 to 1/2 packs daily Comments Medications reviewed with patient Michel Bickers CMA  February 11, 2010 11:06 AM   Physical Exam  General:  normal appearance and thin.   Nose:  clear nasal discharge.   Mouth:  no oral lesion Neck:  no JVD.  Tender on palpation over trapezius Lungs:  prolonged exhalation, no wheezing, no rales Heart:  regular rhythm, normal rate, and no murmurs.   Extremities:  minimal ankle edema Neurologic:  normal CN II-XII and strength normal.   Cervical Nodes:  no significant adenopathy Psych:  anxious.     Impression & Recommendations:  Problem # 1:  DYSPNEA (ICD-786.05)  Likely from emphysema and continued tobacco abuse.  Problem # 2:  HEMOPTYSIS UNSPECIFIED (ICD-786.30)  Resolved.  Her recent CT chest was relatively unremarkable.  Problem # 3:  COPD (ICD-496) Will continue her current inhaler regimen.  Problem # 4:  RHINITIS (ICD-472.0)  She is to continue nasal irrigation.  Problem # 5:  TOBACCO ABUSE (ICD-305.1)  She is to continue bupropion and will try nicotine patch.  I suspect much of her respiratory symptoms will improve once she stops smoking.  Problem # 6:  OTHER CHRONIC PAIN (ICD-338.29) Will monitor this.  She can use as needed OTC medication.  Medications Added to Medication List This Visit: 1)  Nicotine 14 Mg/24hr Pt24 (Nicotine) .... One patch topical  once daily  Complete Medication List: 1)  Spiriva Handihaler 18 Mcg Caps (Tiotropium bromide monohydrate) .... Once daily 2)  Symbicort 160-4.5 Mcg/act Aero (Budesonide-formoterol fumarate) .... Two puffs two times a day 3)  Singulair 10 Mg Tabs (Montelukast sodium) .... Take 1 tab by mouth at bedtime 4)  Ventolin Hfa 108 (90 Base) Mcg/act Aers (Albuterol sulfate) .... 2 puffs up to four times daily as needed 5)  Albuterol Sulfate (2.5 Mg/69ml) 0.083% Nebu (Albuterol sulfate) .... One vial nebulized four times per day 6)  Buproban 150 Mg Xr12h-tab (Bupropion hcl (smoking deter)) .... Take 1 tab by mouth at bedtime 7)  Alprazolam 0.25 Mg Tabs (Alprazolam) .Marland Kitchen.. 1 by mouth two times a day as needed anxiety 8)  Nicotine 14 Mg/24hr Pt24 (Nicotine) .... One patch topical once daily  Other Orders: Est. Patient Level III (84696)  Patient Instructions: 1)  Nicotine patch one topical once daily 2)  Follow up in 3 months Prescriptions: NICOTINE 14 MG/24HR PT24 (NICOTINE) one patch topical once daily  #30 x 1   Entered and Authorized by:   Coralyn Helling MD   Signed by:   Coralyn Helling MD on 02/11/2010   Method used:   [facility] to        [facility] [phone]* (retail)       [address]       [place], [place]  [address]       Ph: [phone] or [phone]       Fax: [phone]   RxID:   [id number]

## 2010-03-07 NOTE — Progress Notes (Signed)
Summary: nos appt  Phone Note Call from Patient   Caller: [email or web address]  Call For: Russ Looper Summary of Call: LMTCB x2 to rsc nos frrom 1/20. Initial call taken by: Darletta Moll,  February 25, 2010 3:57 PM

## 2010-03-13 NOTE — Progress Notes (Signed)
Summary: sick -appt with MW  Phone Note Call from Patient Call back at Home Phone [phone] Call back at [phone]   Caller: Patient Call For: wert Summary of Call: Patient calling  saying she still has a bad cold.  She has done 26 breathing treatments since last Friday and nothing seems to help breathing.  She is very sob and is having a hard time getting around.  Please call, requesting to speak to nurse. Initial call taken by: Lehman Prom,  March 01, 2010 12:35 PM  Follow-up for Phone Call        Called, spoke with pt.  It was difficult to understand the pt d/t loss of voice and SOB.  Pt states since Friday she has used approx 30 breathing treatments d/t chest tightness and SOB.   Also, feeling lightheaded, nausea, wheezing, chest tightness, nonprod cough.  Denies fever.  Requesting rx for pred and abx.  Per phone note from 02/21/10, pt called in and was told she needed OV.  OV was scheduled with MW for 02/25/10 but pt NOS.  I attempted to offer pt appt today but pt states she could not come in beecause she has no transportation.  Pt was advised to call 911 to be taken to the ED for evaluation.  She verbalized understanding.  Will forward message to VS so he is aware.  Coralyn Helling MD  March 03, 2010 10:13 AM  Follow-up by: Gweneth Dimitri RN,  March 01, 2010 3:24 PM

## 2010-03-13 NOTE — Progress Notes (Signed)
Summary: [facility]  Phone Note Refill Request Message from:  Fax from Pharmacy on March 06, 2010 9:27 AM  Refills Requested: Medication #1:  Omeprazole 20mg    Brand Name Necessary? No   Supply Requested: 1 month   Last Refilled: 08/08/2009   Notes: Dr. Craige Cotta patient [facility] [place]   Method Requested: Electronic Next Appointment Scheduled: No pending appointments Initial call taken by: Michel Bickers CMA,  March 06, 2010 9:29 AM  Follow-up for Phone Call        Dr. Craige Cotta, please advise if okay to fill Omeprazole for this patient. The medication is not on the patients medication list.Lori Cooper CMA  March 06, 2010 9:30 AM  Additional Follow-up for Phone Call Additional follow up Details #1::        okay to refill omeprazole 40 mg once daily as needed, dispense 30 with 3 refills. Additional Follow-up by: Coralyn Helling MD,  March 06, 2010 1:43 PM    Additional Follow-up for Phone Call Additional follow up Details #2::    rx sent.Carron Curie CMA  March 06, 2010 2:09 PM   New/Updated Medications: OMEPRAZOLE 40 MG CPDR (OMEPRAZOLE) Take 1 tablet by mouth once a day as needed Prescriptions: OMEPRAZOLE 40 MG CPDR (OMEPRAZOLE) Take 1 tablet by mouth once a day as needed  #30 x 3   Entered by:   Carron Curie CMA   Authorized by:   Coralyn Helling MD   Signed by:   Carron Curie CMA on 03/06/2010   Method used:   Electronically to        [facility] [phone]* (retail)       [address]       [place], [place]  [address]       Ph: [phone] or [phone]       Fax: [phone]   RxID:   [id number]

## 2010-03-14 ENCOUNTER — Other Ambulatory Visit (HOSPITAL_COMMUNITY): Payer: Self-pay | Admitting: Emergency Medicine

## 2010-03-14 ENCOUNTER — Ambulatory Visit (HOSPITAL_COMMUNITY)
Admission: RE | Admit: 2010-03-14 | Discharge: 2010-03-14 | Disposition: A | Discharge status: Home / Self Care | Payer: Medicaid Other | Source: Ambulatory Visit | Attending: Emergency Medicine | Admitting: Emergency Medicine

## 2010-03-14 DIAGNOSIS — J984 Other disorders of lung: Secondary | ICD-10-CM | POA: Insufficient documentation

## 2010-03-14 DIAGNOSIS — J4489 Other specified chronic obstructive pulmonary disease: Secondary | ICD-10-CM | POA: Insufficient documentation

## 2010-03-14 DIAGNOSIS — R0602 Shortness of breath: Secondary | ICD-10-CM | POA: Insufficient documentation

## 2010-03-14 DIAGNOSIS — R918 Other nonspecific abnormal finding of lung field: Secondary | ICD-10-CM | POA: Insufficient documentation

## 2010-03-14 DIAGNOSIS — J398 Other specified diseases of upper respiratory tract: Secondary | ICD-10-CM | POA: Insufficient documentation

## 2010-03-14 DIAGNOSIS — J449 Chronic obstructive pulmonary disease, unspecified: Secondary | ICD-10-CM | POA: Insufficient documentation

## 2010-03-14 DIAGNOSIS — R490 Dysphonia: Secondary | ICD-10-CM | POA: Insufficient documentation

## 2010-03-14 MED ORDER — IOHEXOL 350 MG/ML SOLN
100.0000 mL | Freq: Once | INTRAVENOUS | Status: AC | PRN
  Administered 2010-03-14: 100 mL via INTRAVENOUS

## 2010-03-17 ENCOUNTER — Encounter: Payer: Self-pay | Admitting: Pulmonary Disease

## 2010-03-25 ENCOUNTER — Inpatient Hospital Stay (INDEPENDENT_AMBULATORY_CARE_PROVIDER_SITE_OTHER): Payer: Medicaid Other | Admitting: Pulmonary Disease

## 2010-03-25 ENCOUNTER — Encounter: Payer: Self-pay | Admitting: Pulmonary Disease

## 2010-03-25 DIAGNOSIS — J984 Other disorders of lung: Secondary | ICD-10-CM | POA: Insufficient documentation

## 2010-03-25 DIAGNOSIS — R079 Chest pain, unspecified: Secondary | ICD-10-CM

## 2010-03-25 DIAGNOSIS — F17200 Nicotine dependence, unspecified, uncomplicated: Secondary | ICD-10-CM

## 2010-03-25 DIAGNOSIS — J438 Other emphysema: Secondary | ICD-10-CM

## 2010-04-02 NOTE — Assessment & Plan Note (Signed)
Summary: hfu   Primary Provider/Referring Provider:  n/a  CC:  hfu. Pt states she was in [facility] feb 9 for for difficulty breathing. Pt states she still has difficulty breathing, dry cough, wheezing, chest congetion, feels like something is stuck in her throat, and hoarseness. Pt states based on her ct they found a knot in her throat. Marland Kitchen  History of Present Illness: 45 year old hx of Emphysema, rhinitis, and continued tobacco abuse.  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  She was found to have a possible tracheal lesion, and had bronchoscopy.  There was no lesion at time of bronch, and this was likely a mucus. She was sent home with prednisone and antibiotics.  She had her inhaler changed, but felt that symbicort worked better.  She continues to have cough with chest congestion.  She feels like she has sputum, but has trouble bringing this up.  When she does the sputum is clear.  She has not had blood recently in her sputum.  She does wheeze occasionally.  She has been using mucinex.  She has only smoked two cigarrettes since getting out of the hospital.  She has chest pain with her cough.  She has been hoarse.  Her sinuses are okay.  Reviewed her hospital records form [facility].  Current Medications (verified): 1)  Spiriva Handihaler 18 Mcg Caps (Tiotropium Bromide Monohydrate) .... Once Daily 2)  Symbicort 160-4.5 Mcg/act Aero (Budesonide-Formoterol Fumarate) .... Two Puffs Two Times A Day 3)  Singulair 10 Mg  Tabs (Montelukast Sodium) .... Take 1 Tab By Mouth At Bedtime 4)  Ventolin Hfa 108 (90 Base) Mcg/act Aers (Albuterol Sulfate) .... 2 Puffs Up To Four Times Daily As Needed 5)  Albuterol Sulfate (2.5 Mg/69ml) 0.083% Nebu (Albuterol Sulfate) .... One Vial Nebulized Four Times Per Day 6)  Buproban 150 Mg Xr12h-Tab (Bupropion Hcl (Smoking Deter)) .... Take 1 Tab By Mouth At Bedtime 7)  Omeprazole 40 Mg Cpdr (Omeprazole) .... Take 1 Tablet By Mouth Once A Day As  Needed 8)  Ensure Plus  Liqd (Nutritional Supplements) .... 3 Times A Day 9)  Mucinex Dm 30-600 Mg Xr12h-Tab (Dextromethorphan-Guaifenesin) .... 2 Times A Day  Allergies (verified): 1)  ! Pcn  Past History:  Past Medical History: HTN Emphysema      - Spirometry 10/26/09 FEV1 2.59(88%), FEV1% 73 GERD Chronic Pain Tobacco Abuse Anxiety Echo 04/15/06 EF 65% with normal LV function Rt upper lobe pulmonary nodule      - CT chest Feb 2012 Paroxysmal atrial fibrillation Cirrhosis  Past Surgical History: Bronchoscopy March 2008, Feb 2012  Social History: Patient is a current smoker. 8-10 cigs a day   Vital Signs:  Patient profile:   45 year old female Height:      63 inches Weight:      116.38 pounds BMI:     20.69 O2 Sat:      100 % on Room air Temp:     98.3 degrees F oral Pulse rate:   93 / minute BP sitting:   174 / 94  (left arm) Cuff size:   regular  Vitals Entered By: Carver Fila (March 25, 2010 1:54 PM)  O2 Flow:  Room air CC: hfu. Pt states she was in [facility] feb 9 for for difficulty breathing. Pt states she still has difficulty breathing, dry cough, wheezing, chest congetion, feels like something is stuck in her throat, hoarseness. Pt states based on her ct they found a knot  in her throat.  Comments meds and allergies updated Phone number updated Carver Fila  March 25, 2010 1:54 PM    Physical Exam  General:  normal appearance and thin.   Nose:  clear nasal discharge.   Mouth:  no oral lesion Neck:  no JVD.   Lungs:  prolonged exhalation, no wheezing, no rales, scattered rhonchi clear with cough Heart:  regular rhythm, normal rate, and no murmurs.   Abdomen:  soft, nontender Extremities:  no clubbing, cyanosis, edema, or deformity noted Neurologic:  normal CN II-XII.   Cervical Nodes:  no significant adenopathy Psych:  anxious.     Impression & Recommendations:  Problem # 1:  EMPHYSEMA (ICD-492.8)  She has persistent flare.  I  don't think she needs additional antibiotics.  I will give her an additional course of prednisone. Will stop her symbicort and spiriva.  Will have her use budesonide, ipratropium, and albuterol in her nebulizer.  Problem # 2:  CHEST PAIN (ICD-786.50)  Likely from coughing.  Will give script for hydromet with one refill.  Explained that this is for temporary relief, and not a long term medication.  Problem # 3:  TOBACCO ABUSE (ICD-305.1)  Again explained the need to stop smoking.  Problem # 4:  PULMONARY NODULE (ICD-518.89)  She will need follow up CT chest w/o contrast in August 2012.  Medications Added to Medication List This Visit: 1)  Budesonide 0.25 Mg/44ml Susp (Budesonide) .... One vial nebulized two times a day 2)  Ipratropium Bromide 0.03 % Soln (Ipratropium bromide) .... One vial nebulized four times per day as needed 3)  Ensure Plus Liqd (Nutritional supplements) .... 3 times a day 4)  Mucinex Dm 30-600 Mg Xr12h-tab (Dextromethorphan-guaifenesin) .... 2 times a day 5)  Hydromet 5-1.5 Mg/49ml Syrp (Hydrocodone-homatropine) .... 5ml every 6 hours as needed for cough 6)  Prednisone 10 Mg Tabs (Prednisone) .... 3 pills for 2 days, 2 pills for 2 days, 1 pill for 2 days, 1/2 pill for 2 days  Complete Medication List: 1)  Budesonide 0.25 Mg/67ml Susp (Budesonide) .... One vial nebulized two times a day 2)  Ipratropium Bromide 0.03 % Soln (Ipratropium bromide) .... One vial nebulized four times per day as needed 3)  Albuterol Sulfate (2.5 Mg/26ml) 0.083% Nebu (Albuterol sulfate) .... One vial nebulized four times per day 4)  Ventolin Hfa 108 (90 Base) Mcg/act Aers (Albuterol sulfate) .... 2 puffs up to four times daily as needed 5)  Singulair 10 Mg Tabs (Montelukast sodium) .... Take 1 tab by mouth at bedtime 6)  Buproban 150 Mg Xr12h-tab (Bupropion hcl (smoking deter)) .... Take 1 tab by mouth at bedtime 7)  Omeprazole 40 Mg Cpdr (Omeprazole) .... Take 1 tablet by mouth once a day as  needed 8)  Ensure Plus Liqd (Nutritional supplements) .... 3 times a day 9)  Mucinex Dm 30-600 Mg Xr12h-tab (Dextromethorphan-guaifenesin) .... 2 times a day 10)  Hydromet 5-1.5 Mg/76ml Syrp (Hydrocodone-homatropine) .... 5ml every 6 hours as needed for cough 11)  Prednisone 10 Mg Tabs (Prednisone) .... 3 pills for 2 days, 2 pills for 2 days, 1 pill for 2 days, 1/2 pill for 2 days  Other Orders: Est. Patient Level IV (95621)  Patient Instructions: 1)  Prednisone 10 mg pills: 3 pills for 2 days, 2 pills for 2 days, 1 pill for 2 days, 1/2 pill for 2 days 2)  Stop spiriva and symbicort 3)  Budesonide one vial nebulized two times a day, and rinse mouth after  using 4)  Ipratropium one vial nebulized up to four times per day as needed 5)  Albuterol one vial nebulized up to four times per day as needed  6)  Ventolin as needed  7)  Hydromet 5 ml every 6 hours as needed for cough 8)  Follow up in 3 months Prescriptions: PREDNISONE 10 MG TABS (PREDNISONE) 3 pills for 2 days, 2 pills for 2 days, 1 pill for 2 days, 1/2 pill for 2 days  #13 x 0   Entered and Authorized by:   Coralyn Helling MD   Signed by:   Coralyn Helling MD on 03/25/2010   Method used:   Electronically to        [facility] [phone]* (retail)       [address]       [place], [place]  [address]       Ph: [phone] or [phone]       Fax: [phone]   RxID:   [id number] HYDROMET 5-1.5 MG/5ML SYRP (HYDROCODONE-HOMATROPINE) 5ml every 6 hours as needed for cough  #100 ml x 1   Entered and Authorized by:   Coralyn Helling MD   Signed by:   Coralyn Helling MD on 03/25/2010   Method used:   Print then Give to Patient   RxID:   [id number] IPRATROPIUM BROMIDE 0.03 % SOLN (IPRATROPIUM BROMIDE) one vial nebulized four times per day as needed  #120 x 6   Entered and Authorized by:   Coralyn Helling MD   Signed by:   Coralyn Helling MD on 03/25/2010   Method used:   Electronically to        [facility] [phone]* (retail)       [address]       [place], [place]  [address]       Ph: [phone] or [phone]       Fax: [phone]   RxID:   [id number] BUDESONIDE 0.25 MG/2ML SUSP (BUDESONIDE) one vial nebulized two times a day  #60 x 6   Entered and Authorized by:   Coralyn Helling MD   Signed by:   Coralyn Helling MD on 03/25/2010   Method used:   Electronically to        [facility] [phone]* (retail)       [address]       [place], [place]  [address]       Ph: [phone] or [phone]       Fax: [phone]   RxID:   [id number]    Immunization History:  Pneumovax Immunization History:    Pneumovax:  historical (11/04/2003)

## 2010-04-16 NOTE — Letter (Signed)
Summary: [facility] Discharge Summary  [facility] Discharge Summary   Imported By: Kassie Mends 04/12/2010 10:13:25  _____________________________________________________________________  External Attachment:    Type:   Image     Comment:   External Document

## 2010-05-06 ENCOUNTER — Other Ambulatory Visit: Payer: Self-pay | Admitting: *Deleted

## 2010-05-06 MED ORDER — ALBUTEROL SULFATE (2.5 MG/3ML) 0.083% IN NEBU: 2.5000 mg | INHALATION_SOLUTION | Freq: Four times a day (QID) | RESPIRATORY_TRACT | Status: DC | PRN

## 2010-05-11 LAB — DIFFERENTIAL
Basophils Absolute: 0.1 10*3/uL (ref 0.0–0.1)
Basophils Relative: 1 % (ref 0–1)
Monocytes Relative: 7 % (ref 3–12)
Neutro Abs: 6.2 10*3/uL (ref 1.7–7.7)
Neutrophils Relative %: 54 % (ref 43–77)

## 2010-05-11 LAB — BASIC METABOLIC PANEL
CO2: 25 mEq/L (ref 19–32)
Calcium: 9 mg/dL (ref 8.4–10.5)
Creatinine, Ser: 0.87 mg/dL (ref 0.4–1.2)
GFR calc Af Amer: >60 mL/min (ref >60)
GFR calc non Af Amer: >60 mL/min (ref >60)

## 2010-05-11 LAB — CBC
MCHC: 33.3 g/dL (ref 30.0–36.0)
RBC: 4.12 MIL/uL (ref 3.87–5.11)
RDW: 13.5 % (ref 11.5–15.5)
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
## 2010-06-18 NOTE — Assessment & Plan Note (Signed)
[facility]                             PULMONARY OFFICE NOTE   Kimberly Kemp, Kimberly Kemp                           MRN:          [id number]  DATE:08/18/2006                            DOB:          1965-12-23    I saw Kimberly Kemp today in followup for her asthma, chronic bronchitis,  tobacco abuse, and rhinitis.  She says that since her last visit her  breathing has gotten worse.  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  She says that her symptoms seem to get worse when she is at work  but that when she is away from work for [employer], they get better.  She  has currently been having problems with tightness and wheezing in her  chest as well as a cough productive of yellowish to brownish sputum that  is thick.  She does have nasal congestion with postnasal drip.  She has  not had any fevers, chills, or sweats.  She says that she coughs and  gets a gagging feeling and nauseous from that but denies any abdominal  pain or diarrhea.  She has not had any skin rashes or leg swelling.  She  does smoke about 2-3 cigarettes every other day.   CURRENT MEDICATIONS:  1. Spiriva 1 puff daily.  2. Singulair 10 mg nightly.  3. Zegerid 40 mg nightly.  4. DuoNeb nebulizer 3 times a day.   PHYSICAL EXAMINATION:  VITAL SIGNS: She is 114 pounds.  Temperature 98,  blood pressure 144/80, heart rate 99, oxygen saturation 97% on room air.  HEENT:  No sinus tenderness.  There is clear nasal discharge.  There are  no oral lesions.  NECK:  No lymphadenopathy.  HEART: S1, S2.  CHEST:  There were diffuse bilateral rhonchi.  ABDOMEN:  Soft, nontender.  EXTREMITIES:  No edema.   Chest x-ray in my office today showed no acute disease process.   IMPRESSION:  Acute asthmatic bronchitis.  I will give her a tapering  course of prednisone.  I will restart her on Advair 250/50 one puff  b.i.d. as well as  continuing her on Singulair, Spiriva, DuoNeb  nebulizer, and Nasonex nasal spray.  She is also to continue on the use  of Zegerid.  I have asked her to monitor her peak flows as potentially  she may have feeling of asthma while at work.  I have also again  strongly emphasized to her the importance of remaining completely  abstinent from tobacco products.   I will follow up with her in 1 month.    Coralyn Helling, MD  Electronically Signed   VS/MedQ  DD: 08/18/2006  DT: 08/19/2006  Job #: [id number]

## 2010-06-18 NOTE — Assessment & Plan Note (Signed)
[facility]                             PULMONARY OFFICE NOTE   ARIYONA, EID                           MRN:          [id number]  DATE:10/21/2006                            DOB:          1965/10/30    PULMONARY NOTE:  I had received a phone call from Ms. Manson Passey.  She says  that the last 5 or 6 days she has been having problems with chest  tightness, wheezing, cough with the production of yellowish sputum.  She  is not having any fevers, chills or sweats.  She denies any chest pains  or abdominal pain although she is feeling nauseous.  I have called in a  prescription for a Medrol Dosepak as well as a Z-Pak and I will schedule  her to see me in the office on September 18, although I have advised her  that if her symptoms were to worsen prior to her office evaluation that  she should not wait and she should either call for further advice or  present herself to the nearest hospital for further evaluation.     Coralyn Helling, MD  Electronically Signed    VS/MedQ  DD: 10/21/2006  DT: 10/22/2006  Job #: [id number]

## 2010-06-18 NOTE — H&P (Signed)
NAMEFAYDRA, KORMAN                  ACCOUNT NO.:  [id number]   MEDICAL RECORD NO.:  [id number]          PATIENT TYPE:  INP   LOCATION:  5705                         FACILITY:  [facility]   PHYSICIAN:  Charlcie Cradle. Delford Field, MD, FCCPDATE OF BIRTH:  1965/05/21   DATE OF ADMISSION:  09/02/2006  DATE OF DISCHARGE:                              HISTORY & PHYSICAL   CHIEF COMPLAINT:  Acute respiratory distress.   HISTORY OF PRESENT ILLNESS:  A 45 year old female with history of  smoking, COPD, asthmatic bronchitis.  This patient was admitted for  acute flare-up of same.  She has had increased shortness of breath,  cough and chest congestion that is progressive.  She has a history of  moderate asthma, COPD as well, history of hypertension, tobacco use,  lung nodules in the past and reflux disease.  She is following in the  outpatient internal medicine teaching clinic and with Dr. Craige Cotta who last  saw on August 27, 2006.  She was then restarted on her Advair and got  worse.  Prednisone made that worse as well.  She is still smoking.   PAST HISTORY:  Medical history of:  1. COPD.  2. History of asthmatic bronchitis.  3. History of lung nodules.  4. Reflux disease.   MEDICATIONS PRIOR TO ADMISSION:  1. Spiriva daily.  2. Singulair daily.  3. Protonix daily.  4. Nebulizer with DuoNeb t.i.d.  5. Advair 250/50 1 spray b.i.d.  6. Ultram t.i.d. p.r.n. cough.   Social history and family history and review of systems otherwise  noncontributory.   PHYSICAL EXAMINATION:  Temperature is 97, blood pressure 155/80, pulse  114, respirations 24, saturation 100%.  CHEST:  Showed inspiratory and expiratory wheeze, poor air flow.  CARDIAC EXAM:  Showed a regular rate and rhythm without S3, normal  S1/S2.  ABDOMEN:  Soft, nontender.  EXTREMITIES:  Showed no edema, clubbing or venous disease.  SKIN:  Was clear.  NEUROLOGIC EXAM:  Was intact.   LABORATORY DATA:  Pending at the time of this dictation.  Chest  x-ray  showed COPD changes only.  There is a CBC in place; a white count of  17,000, hemoglobin 14.6, creatinine is 0.9.   IMPRESSION:  Is that of chronic obstructive pulmonary disease  exacerbation with ongoing smoking.   PLAN:  Admit with IV Medrol, nebulizer treatments, IV Avelox, sputum  culture, routine admission labs.      Charlcie Cradle Delford Field, MD, [facility]  Electronically Signed     PEW/MEDQ  D:  09/02/2006  T:  09/03/2006  Job:  [id number]

## 2010-06-18 NOTE — Discharge Summary (Signed)
Kimberly Kemp, Kimberly Kemp                  ACCOUNT NO.:  [id number]   MEDICAL RECORD NO.:  [id number]          PATIENT TYPE:  INP   LOCATION:  5705                         FACILITY:  [facility]   PHYSICIAN:  Coralyn Helling, MD        DATE OF BIRTH:  1966-01-22   DATE OF ADMISSION:  09/02/2006  DATE OF DISCHARGE:  09/04/2006                               DISCHARGE SUMMARY   DISCHARGE DIAGNOSES:  1. Acute bronchitis flare in the setting of chronic obstructive      pulmonary disease/emphysema.  2. Tobacco abuse.  3. Anxiety.   LABORATORY DATA:  1. Date, 09/04/2006:  Sodium 137, potassium 3.9, chloride 108, CO2 23,      glucose 147, BUN 10, creatinine 0.75.  2. Date, 09/04/2006:  White blood cell count 24.7, hemoglobin 12.5,      hematocrit 36.9, platelet count 249,000.  3. Date, 09/03/2006:  Alpha-1 Antitrypsin level 25.1.   RADIOLOGY:  CT of chest demonstrating findings consistent with chronic  obstructive pulmonary disease/emphysema.  There was no interstitial  fibrosis, no pulmonary nodules, masses, or active infiltrates.   BRIEF HISTORY:  This is a 45 year old female who presented on 09/02/2006  who carries a history of active smoking, chronic obstructive pulmonary  disease, and asthmatic bronchitis.  She was admitted for an acute flare  of bronchitis in the setting of the above findings.  She carries a  history of moderate hypertension, active tobacco abuse, severe reflux  disease, and history of lung nodule.  She is followed in the outpatient  Internal Medicine teaching clinic as well as by Dr. Coralyn Helling for  Pulmonary Service.   HOSPITAL COURSE BY DISCHARGE DIAGNOSES:  1. Acute bronchitis flare of chronic obstructive pulmonary disease.      Kimberly Kemp was admitted on 09/02/2006 with the above findings.  She      was treated in the usual fashion with supplemental oxygen, inhaled      bronchodilators, IV systemic steroids, and empiric antibiotics.      Her symptoms continued to improve  over the course of her      hospitalization.  She was transitioned to oral prednisone on September 04, 2006 and eventually cleared for completion of her acute course      in the outpatient setting.  She will be discharged to home on her      regular bronchodilator regimen which consists of daily Spiriva,      twice daily Advair, 4 times a day Proair, and additionally will be      sent home on a prednisone taper as well as 5 more days of oral      Avelox therapy.  She has close outpatient followup already arranged      in the pulmonary clinic with nurse practitioner, Rubye Oaks , on      September 11, 2006, then follow up again with Craige Cotta on September 28, 2006.  2. Tobacco abuse.  This is, no doubt, contributing to her recurrent      acute bronchitis flares.  She has been  counseled extensively and      encouraged to stop.  She will be sent home with instructions to      continue nicotine patch as tolerated and certainly to cease use of      tobacco products.  3. Anxiety.  Plan for this will be to continue her current anxiolytic      regimen with p.r.n. Xanax.  4. Gastroesophageal reflux disease.  Plan for this is to continue      daily Protonix.   DISCHARGE INSTRUCTIONS:  1. Diet as tolerated.  2. Activity as tolerated.  3. Follow up nurse practitioner, Rubye Oaks, Friday, August 8th,      then Dr. Craige Cotta on August 25th at 3:10 p.m.   DISCHARGE MEDICATIONS:  1. Avelox 400 mg tablet daily x5 days.  2. Spiriva 1 cap inhaled daily.  3. Advair 250/50 one puff twice a day.  4. Nasonex 2 sprays each nostril daily.  5. Protonix 40 mg tablet daily.  6. Proair 2 puffs 4 times a day.  7. Prednisone taper with 10 mg tablets 4 tablets daily x3 days then 3      tablets daily x3 days then 2 tablets daily x3 days then 1 tablet      daily x3 days then discontinue.   The following medications are as needed:  1. Xanax 0.5 mg tablet.  She can take 1 tablet up to 3 times a day as      needed for  anxiety.  2. Tramadol 50 mg tablet.  She may take 1 of these every 4 hours as      needed for cough.  3. Tussionex 5 mL every 12 hours as needed for cough.  4. Nicotine patch 14 mg placed on skin daily.      Zenia Resides, NP      Coralyn Helling, MD  Electronically Signed    PB/MEDQ  D:  09/04/2006  T:  09/04/2006  Job:  [id number]

## 2010-06-18 NOTE — Assessment & Plan Note (Signed)
[facility]                             PULMONARY OFFICE NOTE   LEVADA, BOWERSOX                           MRN:          [id number]  DATE:06/12/2006                            DOB:          13-Feb-1965    HISTORY OF PRESENT ILLNESS:  The patient is a 45 year old white female  patient of Dr. Thurston Hole who has a known history of COPD, who presents to  the office for followup today. The patient had recently been  hospitalized early this year in March for an acute hypoxic respiratory  failure secondary to COPD exacerbation.  The patient had been treated  with antibiotics and aggressive reflux and cough prevention.  The  patient was continued on Spiriva and placed on Protonix daily.  She was  also given Mucinex DM and Tramadol for cough control.  The patient  reports she had been doing well up until yesterday when she started  having increased shortness of breath and wheezing.  The patient was seen  at [facility] emergency room last evening and treated with nebulizers and  steroids.  The patient was given a prescription for antibiotic and a  prednisone taper. She does not remember the name of this and we were  unable to get the hospital records at today's visit.  The patient  reports she is feeling much better.  She denies any hemoptysis,  orthopnea, PND or leg swelling.  The patient did have a chest x-ray last  visit 2 weeks ago which was unremarkable.   PHYSICAL EXAMINATION:  GENERAL:  The patient is a pleasant, very nervous  female in no acute distress.  VITAL SIGNS:  She is afebrile with stable vital signs.  Saturation is  100% on room air.  HEENT:  Unremarkable.  NECK:  Supple without cervical adenopathy. No JVD.  LUNGS:  Reveal slightly diminished breath sounds at the bases, otherwise  clear.  CARDIAC:  Regular rate.  ABDOMEN:  Soft and nontender.  EXTREMITIES:  Warm without any edema or rashes.   PLAN:  Mild chronic obstructive pulmonary disease   flare. The patient is  to finish antibiotics and steroids as recommended. Will continue on  Mucinex DM twice daily along with tramadol p.r.n. for cough control. She  will also continue Protonix for reflux prevention. The patient will  recheck here in 2 weeks with a full set of pulmonary function tests,  sooner if needed.      Rubye Oaks, NP       [name]. Sherene Sires, MD, [facility]    TP/MedQ  DD: 06/12/2006  DT: 06/12/2006  Job #: [id number]

## 2010-06-18 NOTE — Assessment & Plan Note (Signed)
[facility]                             PULMONARY OFFICE NOTE   [name], [name]                           MRN:          [id number]  DATE:08/27/2006                            DOB:          March 30, 1965    I saw Kimberly Kemp today in further follow up for her cough.   She says that since being restarted on the Advair, her breathing has  gotten worse and she feels that the prednisone has made things worse as  well. She is having a considerable amount of pain around her lower rib  cage associated with coughing. She says that the cough is dry.  She has  also been getting nauseous as a result of a gagging feeling with her  cough. She is not having any fevers, chills, or sweats. She denies  having any hemoptysis.   CURRENT MEDICATIONS:  1. Spiriva 1 puff daily.  2. Singulair 10 mg nightly.  3. Protonix 40 mg daily.  4. DuoNeb nebulizer t.i.d.  5. Advair 250/50 1 puff b.i.d.   PHYSICAL EXAMINATION:  VITAL SIGNS:  She is 115 pounds. Temperature 99,  blood pressure 162/98, heart rate 107, oxygen saturation 97% on room  air.  HEENT:  There was no sinus tenderness, no oral lesions, no  lymphadenopathy.  HEART:  S1, S2.  CHEST:  No wheezing or rales.  ABDOMEN:  Tender around her lower rib cage. She had positive bowel  sounds.  EXTREMITIES:  No edema.   IMPRESSION:  Cough which appears to have worsened with her  reintroduction of Advair, prednisone, and Spiriva. I will have her hold  on using her Spiriva and Advair, and she is off of her prednisone now. I  will give her Ultram 50 mg t.i.d. p.r.n., as well as Tussionex 5 cc  b.i.d. p.r.n. to help with her cough symptoms and her pain. I have also  encouraged her to completely quit from her use of cigarettes. I would  advise her to continue to use her nebulizer treatments as needed if her  breathing were to get worse. I will then follow up with her in  approximately two to three weeks to reassess her symptom  status. I have  also asked her to continue to monitor her peak flow readings.     Kimberly Helling, MD  Electronically Signed    VS/MedQ  DD: 08/27/2006  DT: 08/28/2006  Job #: [id number]

## 2010-06-18 NOTE — Assessment & Plan Note (Signed)
[facility]                                 ON-CALL NOTE   LAFONDA, PATRON                           MRN:          [id number]  DATE:09/19/2008                            DOB:          June 01, 1965    This apparently is a 45 year old COPD patient of Dr. Craige Cotta whose husband  just paged  on call pager emergently secondary to shortness of  breath.  The patient essentially was an extremist, could not speak 2  sentences to me on the telephone, had severe shortness of breath and you  could tell that this was life threatening.  I immediately told the  husband and the patient on the telephone to hang up and call 911 and to  be taken by ambulance to the fastest hospital as soon as possible.  The  patient understood these recommendations and assured me that they were  going to call 911 and to have an ambulance escort to the hospital  emergently.  I will now look and call the [facility] at [facility]  Emergency Room for patient arrival and status.     Nelda Bucks, MD  Electronically Signed    DJF/MedQ  DD: 09/19/2008  DT: 09/19/2008  Job #: [id number]

## 2010-06-18 NOTE — Assessment & Plan Note (Signed)
[facility]                             PULMONARY OFFICE NOTE   Kimberly Kemp, Kimberly Kemp                           MRN:          [id number]  DATE:07/01/2006                            DOB:          Aug 25, 1965    I saw Kimberly Kemp on followup today for her dyspnea.   She says that she still gets symptoms of tightness in her chest  associated with a cough with production of white-yellowish sputum.  She  occasionally has some blood streaking in it.  She gets occasional  sweats.  She says she has lost about 9 pounds recently.  She does  complain of nasal congestion with postnasal drip.  She apparently had  gone to [facility] for evaluation of these symptoms.  She says  that the use of her inhalers does seem to help.   CURRENT MEDICATIONS:  1. Spiriva 1 puff daily.  2. Singulair 10 mg q.h.s.  3. Zegerid 40 mg q.h.s.  4. DuoNeb 2-3 times daily.   PHYSICAL EXAMINATION:  VITAL SIGNS:  She is 115 pounds, temperature  98.7, blood pressure 138/80.  Heart rate is 86.  Oxygen saturation is  100% on room air.  HEENT:  There is no sinus tenderness.  She has a clear nasal discharge.  There is mild erythema of the posterior pharynx.  There is no  lymphadenopathy, no thyromegaly.  HEART:  S1, S2 with regular rhythm.  CHEST:  Clear to auscultation.  ABDOMEN:  Soft, nontender, positive bowel sounds.  EXTREMITIES:  There was no edema, cyanosis, or clubbing.   IMPRESSION:  1. Asthma with a history of tobacco abuse and chronic bronchitis.  She      does appear to have a significant component to her sinuses as well      with postnasal drip.  I will continue her on her Spiriva and      Singulair for the time being.  I would augment her sinus regimen      with the addition of Nasonex 2 sprays in each nostril daily, and I      have given her a sample of this.  I have also instructed her on the      use of nasal irrigation.  She is to continue on her Zegerid for the  time being as well.  2. Diffuse nodules on chest x-ray in March 2008.  Her most recent      chest x-ray was unremarkable, and therefore I would recommend      clinical monitoring for this with followup imaging studies at her      next visit.   I plan on following up with her in approximately 1 month.     Coralyn Helling, MD  Electronically Signed   VS/MedQ  DD: 07/07/2006  DT: 07/07/2006  Job #: [id number]

## 2010-06-21 NOTE — Assessment & Plan Note (Signed)
[facility]                             PULMONARY OFFICE NOTE   NAME:Kimberly Kemp, Kimberly Kemp                           MRN:          [id number]  DATE:05/07/2006                            DOB:          [birth year]    PULMONARY ACUTE EXTENDED OFFICE EVALUATION   HISTORY:  This is a 45 year old white female, active smoker, who was  just admitted with COPD exacerbation with acute hypoxemic respiratory  failure and felt to have moderate asthma with diffuse lung nodules, on  CT scan, of unknown etiology. See Discharge Summary from April 18, 2006.  She said she was only better for a few days and now much worse over the  last three days with hacking cough productive of yellow to green sputum  and severe hoarseness. She has been placed on Singulair, Advair, and  Spiriva but does not feel it is helping. Note, however, she is back  smoking again full-time.   She denies any pleuritic pain or epistaxis, nausea, vomiting or  diarrhea, myalgias, arthralgias or rash.   For full inventory of medications, please see face sheet, dated May 07, 2006.   PHYSICAL EXAMINATION:  She is an anxious white female who appears older  than her stated age, chronically, but not acutely ill. She is afebrile  with stable vitals.  HEENT: Is unremarkable. There is no nasal turbinate edema, crusting or  ulceration. Oropharynx is clear without cobblestoning. Dentition is  intact. Ear canals are clear bilaterally.  NECK: Supple without cervical adenopathy or tenderness. Trachea is  midline without thyromegaly.  CHEST: Was completely clear to auscultation and percussion.  LUNGS: Lung fields are perfectly clear bilaterally to auscultation and  percussion.  HEART: Regular rate and rhythm without murmur, gallop or rub.  ABDOMEN: Soft, benign.  EXTREMITIES: Warm without calf tenderness, cyanosis, clubbing or edema.   There is a marked discrepancy between her examination and her  complaints. Note,  that she is 98% on room air with basically clear  lungs. Therefore, I do not think she has significant COPD, although  certainly she is at risk given her smoking history. I have recommended  that she stop Advair since most of her complaints relate to cough  disproportionate to dyspnea at present and suppress the cough with  Tramadol 50 mg one every four hours.   To treat her purulent sputum and sore throat, I have recommended  empirically adding Zegerid 40 mg at bedtime and a  7 day course of  doxycycline with followup here at 3 weeks.   I finished the visit with the patient today reviewing her workup to date  emphasizing the importance of smoking connection.   I also reviewed gastroesophageal reflux disease diet with her in text  formatted material along with the recommendation to avoid the use of  menthol containing cough drops, use albuterol two puffs every 4 hours  p.r.n. shortness of breath and wheeze. Use Mucinex DM two b.i.d. p.r.n.  cough and if still coughing to add tramadol 50 mg one q4 to the regimen.   Followup here with me or  in three weeks by Dr. Craige Cotta, sooner if needed.  Chest x-ray is pending at time of this dictation.     [name]. Sherene Sires, MD, [facility]  Electronically Signed    MBW/MedQ  DD: 05/07/2006  DT: 05/07/2006  Job #: [id number]

## 2010-06-21 NOTE — Discharge Summary (Signed)
NAMEGRAE, LEATHERS NO.:  [id number]   MEDICAL RECORD NO.:  [id number]          PATIENT TYPE:  INP   LOCATION:  5149                         FACILITY:  [facility]   PHYSICIAN:  Fransisco Hertz, M.D.  DATE OF BIRTH:  Aug 25, 1965   DATE OF ADMISSION:  04/14/2006  DATE OF DISCHARGE:  04/18/2006                               DISCHARGE SUMMARY   DISCHARGE DIAGNOSES:  1. Chronic obstructive pulmonary disease exacerbation with acute      hypoxemic respiratory failure.  2. Moderate asthma.  3. Upper respiratory tract infection, acute bronchitis.  4. Hypertension.  5. Tobacco abuse.  6. Diffuse lung nodules seen on CT scan, unknown etiology.  7. Gastroesophageal reflux disease.   DISCHARGE MEDICATIONS:  1. Prednisone 60 mg 6-day taper.  2. Albuterol MDI inhaler 2 puffs q.4-6 h. p.r.n. for wheezing.  3. Spiriva 18 mcg inhaler 1 inhalation daily.  4. Avelox 400 mg 1 tablet p.o. daily x7 days.  5. Singulair 10 mg 1 tablet p.o. q.h.s.  6. Advair 250/50 Diskus one inhalation daily.  7. Tramadol 50 mg 1-2 tablets q.4-6 h. p.r.n. for rib cage pain.  8. The patient was instructed to stop taking theophylline.   DISPOSITION AND FOLLOWUP:  Ms. Calk was discharged in good condition to  her private residence.  She had significant resolution of her  respiratory symptoms and was placed on p.o. and inhaler medication  regimen at the time of discharge.  She will need to schedule an  appointment to follow up with [facility], Dr. Coralyn Helling in 2-  3 weeks.  She will call for an appointment.  She will also be set up for  primary care in the [facility] and an appointment will  be scheduled to the [place] Alert system.  At the time of her followup, she  will need to be followed for determination of pathology of lung nodules  seen on CT scan.  These will need to be monitored over a period of time.  She will also need to be monitored for worsening of her pulmonary  symptoms, formal pulmonary function tests, and medical management of her  chronic medical problems.  She is also requesting support in the  outpatient setting for smoking cessation.   PROCEDURES PERFORMED:  1. Bronchoscopy was performed by Dr. Coralyn Helling.  Cultures and      pathology are pending at the time of discharge.  There were no      major complications with bronchoscopy.  She did have some post      bronch hemoptysis but this is resolved at discharge.  2. CT angio of chest:  Bilateral small pulmonary nodules are seen      throughout the chest CT.  There is a large differential on these      nodules.  There were no discrete masses seen.  There was no      mediastinal adenopathy.  3. Chest x-ray findings consistent with COPD without an acute process.   CONSULTATIONS:  Dr. Coralyn Helling, pulmonology.   BRIEF ADMITTING HISTORY AND PHYSICAL:  Ms. Doiron is a 45 year old woman  with a past medical history of asthma and hypertension who presented to  her primary care physician's office 1 day prior to admission with a  history of 3 days of dyspnea and cough.  The patient was in respiratory  distress at the office and EMS was called for transport to [facility].  En route, Ms. Mathers did complain of chest pain.  There were  some nonspecific ST elevations on her EKG which activated a code STEMI  per the emergency department/EMS.  EMS initiated treatment with  nitroglycerin, and aspirin.  Once she got to the cath lab, it  was determined by the cardiologist that she was not having an MI  therefore no cath was performed.  She was then transported back to the  emergency department for management of her respiratory distress.  Historically, Ms. An complained of  a productive cough.  She said  that she had increased use of her home inhalers including albuterol.  She also had intermittent pleuritic chest pain.  Prior to this acute  episode, she states that she had a viral  upper respiratory like  infection with a sore throat and nasal congestion for 1 week.  She does  complain of self-reported fevers around 100 degrees Fahrenheit.  She  also reports a poor appetite, one or two episodes of vomiting over the  past 3 days secondary to a cough.  She denies the use of any recent  antibiotics.   PHYSICAL EXAMINATION:  VITAL SIGNS ON ADMISSION:  Temperature 98.5,  blood pressure 129/53, pulse 100, respiratory rate 24, O2 sats 99% on 4  liters.  GENERAL:  On initial examination, Ms. Marschall was in moderate respiratory  distress.  HEAD AND NECK:  Her oropharynx was erythematous with no exudates on her  throat.  Her neck was supple.  There was no lymphadenopathy.  There was  no JVD.  LUNGS:  On examination of her lungs, she was using accessory muscles to  breathe and she had costal retractions.  There was no nasal flaring or  cyanosis.  However, she did have decreased air movement on exam, diffuse  coarse rhonchi and crackles and she also had inspiratory wheezing  anteriorly in the upper lung fields.  HEART:  Heart sounds were distant.  They were regular rate and rhythm  with no murmurs, rubs or gallops appreciated.  ABDOMEN:  Her abdomen was soft and nontender.  She does have some  tenderness to palpation of the epigastrium.  EXTREMITIES:  There was no edema or cyanosis, questionable early  clubbing of her distal phalanges.  NEUROLOGICALLY:  She was alert and oriented x3.  Her cranial nerves were  intact and neurological exam was within normal limits.  PSYCHIATRIC EXAM:  She was appropriate.  She had a normal-looking  appearance and she was only mildly anxious secondary to her dyspnea.   LABORATORIES ON ADMISSION:  Sodium 135, potassium 3.7, chloride 107,  bicarb 19, BUN 10, creatinine 0.8, glucose 106.  Complete blood count:  WBCs 14.8, hemoglobin of 14.2, platelets 284, hematocrit 41.4, ANC 11.5, RDW 13.2, MCV 91.  Arterial blood gas #1 in the emergency  department:  pH 7.64, pCO2 15.8, pO2 56 and bicarb 17.2.  Her UDS was positive for  marijuana and opiates.  D-dimer 0.3.  Point-of-care markers for cardiac  were negative.  Urinalysis was negative.  Her BNP was less than 30.   HOSPITAL COURSE BY PROBLEM:  Problem 1.  ACUTE HYPOXEMIC RESPIRATORY  FAILURE:  Ms. Viviani was admitted to the hospital with probable COPD or  asthma exacerbation.  Her respiratory status was critical at the time of  admission.  Her O2 sat revealed an extreme respiratory alkalosis with  hypoxemia.  She was subsequently placed on BiPAP for support of her  respiratory function.  Her distress may have been exacerbated by the  morphine that she got prior en route for her code STEMI since it  resolved fairly quickly with NIPPV.  She was treated symptomatically  with nebs including albuterol and Atrovent.  She was placed on high-dose  IV Solu-Medrol at admission.  She was also empirically started on  antibiotics.  After several hours on BiPAP, she was able to be weaned  off this and placed on Venturi mask and eventually to nasal cannula.  She responded very well to our interventions.  A CT scan of her chest in  order to rule out pulmonary embolism was obtained and did show a diffuse  nodularity concerning for either an infectious process or inflammatory  process and potentially metastatic disease however this is low  probability.  Pulmonology evaluated Ms. Manson Passey and did a diagnostic  bronchoscopy.  The bronchoscopy was fairly uncomplicated except for some  bleeding after the initial biopsy therefore limited samples were taken.  The results of the bronchial washings are pending.  Initial respiratory  cultures showed no growth.  On chest x-ray there was no evidence of a  pneumonia.  She was also afebrile throughout her hospitalization but she  did have a leukocytosis therefore it is possible that she had an upper  respiratory acute bronchitis.  At the time of discharge Ms.  Spragg is  stable from a pulmonary standpoint, her O2 sats are in the upper 90s  with 97% on room air and with ambulation.  She does not appear to have  any distress while breathing and her lungs are clear to auscultation at  the time of discharge.  She will need to have these lung nodules seen on  CT evaluated in the outpatient setting, so far workup of any autoimmune,  infectious, or inflammatory process has been negative.  Ms. Zanetti was  also instructed to quit smoking and did have smoking cessation  counseling done in the hospital.   Problem 2. HYPERTENSION:  This was fairly stable throughout her  hospitalization, we continued her on low-dose Norvasc which she was on  prior to admission.  There were no episodes of hypertension.   Problem 3. RIGHT ATRIAL ENLARGEMENT ON EKG:  There was some  abnormalities seen on initial EKG questioning right atrial enlargement, 2-D echo was obtained.  This showed normal cardiac function.  These were  probably acute EKG findings and the changes were representative of  increased work of breathing and her respiratory distress.   DISCHARGE LABORATORIES:  WBCs 14.0, hemoglobin 11.5, platelets 257,  hematocrit 33.8.  Sodium 137, potassium 4.2, chloride 105, bicarb 23,  BUN 11, creatinine 0.6, glucose 149.  Respiratory cultures revealed  normal oropharyngeal flora.  Blood cultures were negative.  ESR was  normal.  ACE level normal.   VITAL SIGNS AT DISCHARGE:  Temperature 97.5, blood pressure 101/60,  heart rate 66, respiratory rate 20, O2 sat is 97% on room air.      Edsel Petrin, D.O.  Electronically Signed      Fransisco Hertz, M.D.  Electronically Signed    ELG/MEDQ  D:  04/18/2006  T:  04/19/2006  Job:  [id number]   cc:   Coralyn Helling, MD

## 2010-06-21 NOTE — Assessment & Plan Note (Signed)
[facility]                             PULMONARY OFFICE NOTE   Kimberly Kemp, Kimberly Kemp                           MRN:          [id number]  DATE:05/28/2006                            DOB:          [birth year]    HISTORY OF PRESENT ILLNESS:  Patient is a 45 year old white female  patient of Dr. Thurston Hole who was recently seen in the office for a post  hospitalization visit.  Patient was hospitalized April 14, 2006 through  April 18, 2006 for an acute hypoxic respiratory failure secondary to  COPD exacerbation.  Patient did have decreased O2 saturations, and  required BiPAP support initially.  A CT scan of her chest was negative  for pulmonary embolism, but showed diffuse nodularity.  The patient did  undergo a bronchoscopy by Dr. Coralyn Helling.  Transbronchial biopsy showed  mild chronic inflammation, benign bronchial and alveolar tissues with  mild chronic inflammation, no malignant cells were identified.  Last  visit, patient continued to complain of persistent cough, congestion,  and severe hoarseness.  Patient was continued on Spiriva.  She was  suspected to have some upper airway instability, and was recommended to  discontinue Advair, and was given a 7-day course of doxycycline.  Patient was also placed on Zegerid at bedtime, and given Mucinex DM and  tramadol for cough control.  Patient reports her symptoms did improve  while on Zegerid and tramadol, however, over the last several days she  has ran out of Zegerid and tramadol, and symptoms have returned.  She  complains that cough has returned with severe coughing paroxysms to the  point where her ribcage hurt.  Patient also noticed this morning that  she coughed up some blood tinged sputum mixed with thick yellow sputum.  Chest x-ray done last visit on April 06, 2006 showed resolution of the  right upper lobe infiltrate, and on acute disease was noted.   PAST MEDICAL HISTORY:  Reviewed.   CURRENT  MEDICATIONS:  Reviewed.   PHYSICAL EXAMINATION:  Patient is a thin white female who appears very  anxious.  She is afebrile with stable vital signs.  Her O2 saturation is 98% on  room air.  HEENT:  Unremarkable.  NECK:  Supple without cervical adenopathy.  No JVD.  LUNG SOUNDS:  Reveal diminished breath sounds in the bases without any  wheezing or crackles.  CARDIAC:  Regular rate and rhythm.  ABDOMEN:  Abdomen with some epigastric tenderness.  No guarding or  rebound is noted.  EXTREMITIES:  Warm without any calf cyanosis, clubbing, or edema.  SKIN:  Warm.  Patient has several excoriations along her arms that  appear compatible with scratch marks.   IMPRESSION AND PLAN:  1. Recent chronic obstructive pulmonary disease exacerbation requiring      hospitalization.  Patient continues to have significant upper      airway instability.  Patient does have Medicaid with prescription      coverage.  Patient has been recommended to begin Protonix 40 mg      daily for any residual reflux that could be  irritating airways.      May use Mucinex DM twice daily along with tramadol 50 mg every 4      hours as needed for breakthrough coughing.  Patient is to use non-      mint lozenges to help avoid throat clearing and coughing as well.      Is encouraged on smoking cessation.  Patient does smoke up to 1-1/2      packs a day at times.  Chest x-ray is pending at the time of      dictation.  2. Anxiety.  Patient is recommended to follow up with her primary care      doctor concerning this.  Patient reports she has had chronic      anxiety her entire life, and has previously been on Xanax. Xanax      was not given at today's visit, and has been recommended to follow      up with her primary care physician.      Rubye Oaks, NP  Electronically Signed      [name]. Sherene Sires, MD, [facility]  Electronically Signed   TP/MedQ  DD: 05/28/2006  DT: 05/28/2006  Job #: [id number]

## 2010-06-21 NOTE — Assessment & Plan Note (Signed)
[facility]                                 ON-CALL NOTE   GHADA, ABBETT                           MRN:          [id number]  DATE:10/15/2007                            DOB:          04-02-1965    Date of Service: 10/15/2007   TIME OF CALL:  1735 hours.   The call was returned at 2000 hours.  This is a patient of Dr. Craige Cotta.   CALLER:  Safiyya Stokes.   Ms. Kooy recently saw the [facility] nurse practitioner for increased  cough and sputum production earlier this week.  She was prescribed  doxycycline 100 mg by mouth twice daily.  Since starting doxycycline,  she has had increased cough and is calling today because she noticed  some nail-sized hemoptysis.  She does not have hemoptysis in all of her  sputum production.  She states that she has been afebrile, but has had  increasing need for her albuterol.  She does have sick contacts both her  niece and nephew have had a cold.  She denies any history of kidney  disease or diabetes mellitus.  She is concerned; however,  because she  has noted increased frequency of urination over the past few weeks.  This has been associated with increased thirst.   ASSESSMENT:  Chronic obstructive pulmonary exacerbation, not responding  to doxycycline.   1. Levofloxacin 500 mg tablets 1 tablet by mouth once daily for 10      days.  2. Medrol Dosepak.  3. The patient also requested tramadol 50 mg 1 to 2 tablets p.o. every      4-6 hours p.r.n.   These prescriptions were called into [facility] ([phone]) the evening of  October 15, 2007.   PLAN:  The patient was advised to come to the emergency department  should her hemoptysis become worse.  Additionally, she should present to  the [facility] Emergency Department should her shortness of breath  continue to worsen despite his intervention.  I asked her to call her  PCP this Monday to make an appointment for further evaluation of her  polyuria and to make a f/u appt  with Dr. Craige Cotta.  Ms. Krupp voiced  understanding of this plan.     Sherstin Robin Searing, MD  Electronically Signed    STL/MedQ  DD: 10/17/2007  DT: 10/18/2007  Job #: [id number]   cc:   Coralyn Helling, MD

## 2010-06-21 NOTE — Consult Note (Signed)
NAMEMAGIE, CIAMPA NO.:  [id number]   MEDICAL RECORD NO.:  [id number]          PATIENT TYPE:  INP   LOCATION:  5149                         FACILITY:  [facility]   PHYSICIAN:  Coralyn Helling, MD        DATE OF BIRTH:  02/23/1965   DATE OF CONSULTATION:  04/15/2006  DATE OF DISCHARGE:                                 CONSULTATION   REFERRING PHYSICIAN:  Fransisco Hertz, M.D.   REASON FOR CONSULTATION:  Possible COPD exacerbation.   Ms. Kreiser is a 45 year old female who was admitted on April 14, 2006,  with symptoms of shortness of breath.  She apparently was found to have  a possible abnormal EKG and as a result was referred to the cardiac  catheterization lab because of concern for acute myocardial infarction.  However, upon review by the cardiologist it appeared that she was not  having a coronary event.  She does have a significant history of tobacco  abuse, and she apparently had been having symptoms of chest tightness  and coughing with production of a yellow sputum for the last several  days.  She also been feeling feverish, but did not have any chills.  She  says she had one or two episodes in which she had coughing with sputum  mixed with blood, but denied any frank hemoptysis.  She has not had any  weight loss.  She denies any headaches or dizziness, abdominal pain,  nausea, vomiting or diarrhea.  She also denies any joint stiffness or  any leg swelling.  There is no significant travel history.  She does not  have any exposure to farm animals or birds.   PAST MEDICAL HISTORY:  Significant for hypertension.   FAMILY HISTORY:  Is significant for her father and paternal grandmother  who had emphysema at an early age.   SOCIAL HISTORY:  She continues to smoke cigarettes.   REVIEW OF SYSTEMS:  Essentially negative, except as stated above.   PHYSICAL EXAMINATION:  VITAL SIGNS:  Temperature is 97.9, blood pressure  is 100/49, heart rate 69, respiratory rate is  19, oxygen saturation is  100% on a non-rebreather mask.  HEENT:  Pupils reactive.  There is no sinus tenderness.  No nasal  discharge.  No oral lesions.  No lymphopathy.  HEART:  S1-S2.  CHEST:  She had fine wheezes heard bilaterally.  ABDOMEN:  Thin, soft, nontender.  EXTREMITIES:  There was no edema, cyanosis, clubbing.  NEUROLOGIC:  She is alert and oriented and was able to move all four  extremities.   Arterial blood gas showed a pH of 7.41, pCO2 is 29.4, pO2 was 183.  Hemoglobin 12.4, hematocrit 35.5, WBC 13, platelet count 253.  Sodium is  134, potassium 4.1, chloride is 104, CO2 is 19, BUN is 14, creatinine is  1.  AST 17, ALT is 9, ALP is 53, bilirubin is 0.6.  Albumin was 2.5.  Calcium was 8.9.  BNP was less than 30.  D-dimer was 0.33.  Troponin was  less than 0.05.  Urine drug screen was positive  for opiates as well as  tetrahydrocannabinol.  Free T4 is 1.17.  TSH was 0.637.  Theophylline  level was 5.4.  Influenza A and B nasal swab was negative.   CHEST X-RAY:  Showed changes of COPD with hyperinflation but no definite  infiltrates.   IMPRESSION:  1. Likely chronic obstructive pulmonary disease exacerbation.  She has      never actually undergone pulmonary function tests to document      airflow obstruction but she certainly has symptoms compatible with      this.  In addition to her significant tobacco history, I would      continue her on nebulizer treatments,  I would taper her Solu-      Medrol.  And, it may be best to refrain from the use of      theophylline as there may be better alternative medications.      Additionally, given her early age of symptoms, I would check an      alpha-1 antitrypsin  level as well as an IgE level.  2. Respiratory alkalosis.  It is difficult to determine what the      etiology of this is.  She does not appear to be septic.  Her D-      dimer was negative, which would speak against the possibility of a      pulmonary embolism.  She  appears quite comfortable symptomatically      at the present time.  I would follow up on her arterial blood gas      as her clinical status improves.      Coralyn Helling, MD  Electronically Signed     VS/MEDQ  D:  04/16/2006  T:  04/17/2006  Job:  [id number]

## 2010-06-21 NOTE — Op Note (Signed)
[name], Kimberly Kemp                  ACCOUNT NO.:  [id number]   MEDICAL RECORD NO.:  [id number]          PATIENT TYPE:  INP   LOCATION:  5149                         FACILITY:  [facility]   PHYSICIAN:  Coralyn Helling, MD        DATE OF BIRTH:  1965-03-04   DATE OF PROCEDURE:  04/17/2006  DATE OF DISCHARGE:  04/18/2006                               OPERATIVE REPORT   PROCEDURE:  Bronchoscopy with bronchoalveolar lavage and transbronchial  biopsy of the right upper lobe.   PREOPERATIVE DIAGNOSIS:  Multiple diffuse pulmonary nodules, rule out  sarcoidosis versus infectious etiology.   POSTOPERATIVE DIAGNOSIS:  Multiple diffuse pulmonary nodules, rule out  sarcoidosis versus infectious etiology.   PROCEDURE IN DETAIL:  The procedure was explained to the patient. The  risks involved were described including bleeding, infection,  pneumothorax and no diagnosis. Consent was signed by the patient.  The  patient was brought to the bronchoscopy suite.  She had viscous  lidocaine applied to her right nares as well as Cetacaine spray to her  posterior pharynx.  She was given a total of 12 mg of Versed and 150 mcg  of fentanyl intravenously for sedation and analgesia.  The bronchoscope  was initially attempted to pass through to her right nares but due to  resistance, the bronchoscope was passed through her mouth.  The vocal  cords were visualized and appeared to have normal movement.  1%  lidocaine was instilled as needed for topical anesthesia.  The  bronchoscope was then entered to the trachea.  The carina was visualized  and did not appear to be widened. The airways appeared to be hyperemic.  The bronchoscope was then entered into the left main bronchus.  The left  upper, lingular, and lower lobe orifices were visualized and there is no  obvious endobronchial lesions.  The bronchoscope was then entered into  the right main bronchus.  The right upper, middle and lower lobe  orifices were visualized  and there is no obvious endobronchial lesions.  The bronchoscope was then wedged into position in the anterior segment  of the right upper lobe. 50 mL of saline was instilled for  bronchoalveolar lavage with approximately 25 mL of clear fluid returned.  Th The trap was disconnected and saved. Then using fluoroscopic  guidance, a transbronchial biopsy was obtained from the anterior segment  of the right upper lobe.  Subsequent to this, the patient developed in a  significant amount of bleeding with an estimated blood loss of  approximately 50 mL. With continuous suctioning and saline irrigation,  the bleeding  subsided. The airways were then inspected and there was no  signs of any further bleeding.  The bronchoscope was then withdrawn. The  patient was able to maintain her oxygen saturation during the entire  procedure and remained, otherwise, hemodynamically stable during the  procedure.   I will send the bronchoalveolar lavage for cytology and microbiology and  I will send the transbronchial biopsy for pathology. I will follow up  with a chest x-ray in the recovery room and I will  follow-up on her CBC  as well as her PT and PTT.  I will hold on giving her any  anticoagulation although, she was not on any anticoagulation medications  prior to the procedure.     Coralyn Helling, MD  Electronically Signed    VS/MEDQ  D:  04/17/2006  T:  04/18/2006  Job:  [id number]

## 2010-06-25 ENCOUNTER — Encounter: Payer: Self-pay | Admitting: Pulmonary Disease

## 2010-06-26 ENCOUNTER — Ambulatory Visit (INDEPENDENT_AMBULATORY_CARE_PROVIDER_SITE_OTHER): Payer: Medicaid Other | Admitting: Pulmonary Disease

## 2010-06-26 ENCOUNTER — Encounter: Payer: Self-pay | Admitting: Pulmonary Disease

## 2010-06-26 DIAGNOSIS — F17200 Nicotine dependence, unspecified, uncomplicated: Secondary | ICD-10-CM

## 2010-06-26 DIAGNOSIS — R0683 Snoring: Secondary | ICD-10-CM | POA: Insufficient documentation

## 2010-06-26 DIAGNOSIS — J984 Other disorders of lung: Secondary | ICD-10-CM

## 2010-06-26 DIAGNOSIS — R0989 Other specified symptoms and signs involving the circulatory and respiratory systems: Secondary | ICD-10-CM

## 2010-06-26 DIAGNOSIS — J438 Other emphysema: Secondary | ICD-10-CM

## 2010-06-26 NOTE — Patient Instructions (Addendum)
Will schedule sleep study and call with results Will arrange for CT chest in August 2012 Stop zyban Stop singulair Will arrange for primary care evaluation Follow up in August after CT chest

## 2010-06-26 NOTE — Assessment & Plan Note (Signed)
Will continue her nebulizer therapy.  Will stop singulair.

## 2010-06-26 NOTE — Assessment & Plan Note (Signed)
She has symptoms suggestive of sleep apnea.  She has a history of hypertension.  To further assess will schedule sleep study.

## 2010-06-26 NOTE — Assessment & Plan Note (Signed)
She will need follow up CT chest in August 2012.

## 2010-06-26 NOTE — Progress Notes (Signed)
Subjective:    Patient ID: Kimberly Kemp, female    DOB: 01-13-1966, 45 y.o.   MRN: [id number]  HPI 45 year old hx of Emphysema, rhinitis, and continued tobacco abuse.  Her son was diagnosed with schizophrenia.  She has been more stressed.  She is now smoking 1 ppd.  She does not think zyban is helping.  Her breathing gets rough at night.  She wakes up feeling suffocated.  She has a gasp for air.  Her husband says that she snores and stops breathing while asleep.  She has occasional wheeze, more at night.  She has occasional cough but not much sputum.  She denies fever or hemoptysis.  She uses her nebulizer 4 times per day, and this works better than her inhalers did.  Past Medical History  Diagnosis Date  . Hypertension   . Emphysema   . GERD (gastroesophageal reflux disease)   . Chronic pain   . Tobacco abuse   . Anxiety   . Pulmonary nodule, right     upper lobe; CT Chest Feb 2012  . Paroxysmal atrial fibrillation   . Cirrhosis      Family History  Problem Relation Age of Onset  . Emphysema Father   . Emphysema      grandmother     History   Social History  . Marital Status: Married    Spouse Name: N/A    Number of Children: N/A  . Years of Education: N/A   Occupational History  . Not on file.   Social History Main Topics  . Smoking status: Current Everyday Smoker -- 1.0 packs/day    Types: Cigarettes  . Smokeless tobacco: Never Used  . Alcohol Use: No  . Drug Use: No  . Sexually Active: Not on file   Other Topics Concern  . Not on file   Social History Narrative  . No narrative on file     Allergies  Allergen Reactions  . Penicillins      Outpatient Prescriptions Prior to Visit  Medication Sig Dispense Refill  . albuterol (PROVENTIL) (2.5 MG/3ML) 0.083% nebulizer solution Take 3 mLs (2.5 mg total) by nebulization every 6 (six) hours as needed for wheezing.  375 mL  3  . albuterol (VENTOLIN HFA) 108 (90 BASE) MCG/ACT inhaler Inhale 2 puffs into the  lungs. Up to four times daily as needed       . budesonide (PULMICORT) 0.25 MG/2ML nebulizer solution Take 0.25 mg by nebulization 2 (two) times daily.        Marland Kitchen dextromethorphan-guaiFENesin (MUCINEX DM) 30-600 MG per 12 hr tablet Take 1 tablet by mouth every 12 (twelve) hours.        Marland Kitchen HYDROcodone-homatropine (HYCODAN) 5-1.5 MG/5ML syrup Take 5 mLs by mouth every 6 (six) hours as needed. For cough       . ipratropium (ATROVENT) 0.02 % nebulizer solution Take 500 mcg by nebulization 4 (four) times daily as needed.        . Nutritional Supplements (ENSURE PLUS PO) Take by mouth 3 (three) times daily.        Marland Kitchen omeprazole (PRILOSEC) 40 MG capsule Take 40 mg by mouth daily as needed.        Marland Kitchen buPROPion (ZYBAN) 150 MG 12 hr tablet Take 150 mg by mouth at bedtime.        . montelukast (SINGULAIR) 10 MG tablet Take 10 mg by mouth at bedtime.         Review of Systems  Objective:   Physical Exam Filed Vitals:   06/26/10 1335  BP: 170/94  Pulse: 69  Temp: 98.3 F (36.8 C)  TempSrc: Oral  Height: 5\' 3"  (1.6 m)  Weight: 115 lb (52.164 kg)  SpO2: 100%   General: normal appearance and thin.  Nose: clear nasal discharge.  Mouth: no oral lesion  Neck: no JVD.  Lungs: prolonged exhalation, no wheezing, no rales, scattered rhonchi clear with cough  Heart: regular rhythm, normal rate, and no murmurs.  Abdomen: soft, nontender  Extremities: no clubbing, cyanosis, edema, or deformity noted  Neurologic: normal CN II-XII.  Cervical Nodes: no significant adenopathy  Psych: anxious.     Assessment & Plan:   EMPHYSEMA Will continue her nebulizer therapy.  Will stop singulair.  PULMONARY NODULE She will need follow up CT chest in August 2012.  TOBACCO ABUSE She does not think zyban is doing anything, and wants to stop.  Snoring She has symptoms suggestive of sleep apnea.  She has a history of hypertension.  To further assess will schedule sleep study.    Updated Medication  List Outpatient Encounter Prescriptions as of 06/26/2010  Medication Sig Dispense Refill  . albuterol (PROVENTIL) (2.5 MG/3ML) 0.083% nebulizer solution Take 3 mLs (2.5 mg total) by nebulization every 6 (six) hours as needed for wheezing.  375 mL  3  . albuterol (VENTOLIN HFA) 108 (90 BASE) MCG/ACT inhaler Inhale 2 puffs into the lungs. Up to four times daily as needed       . budesonide (PULMICORT) 0.25 MG/2ML nebulizer solution Take 0.25 mg by nebulization 2 (two) times daily.        Marland Kitchen dextromethorphan-guaiFENesin (MUCINEX DM) 30-600 MG per 12 hr tablet Take 1 tablet by mouth every 12 (twelve) hours.        Marland Kitchen HYDROcodone-homatropine (HYCODAN) 5-1.5 MG/5ML syrup Take 5 mLs by mouth every 6 (six) hours as needed. For cough       . ipratropium (ATROVENT) 0.02 % nebulizer solution Take 500 mcg by nebulization 4 (four) times daily as needed.        . Nutritional Supplements (ENSURE PLUS PO) Take by mouth 3 (three) times daily.        Marland Kitchen omeprazole (PRILOSEC) 40 MG capsule Take 40 mg by mouth daily as needed.        Marland Kitchen DISCONTD: buPROPion (ZYBAN) 150 MG 12 hr tablet Take 150 mg by mouth at bedtime.        Marland Kitchen DISCONTD: montelukast (SINGULAIR) 10 MG tablet Take 10 mg by mouth at bedtime.

## 2010-06-26 NOTE — Assessment & Plan Note (Signed)
She does not think zyban is doing anything, and wants to stop.

## 2010-07-04 ENCOUNTER — Telehealth: Payer: Self-pay | Admitting: Pulmonary Disease

## 2010-07-04 NOTE — Telephone Encounter (Signed)
Dr Craige Cotta, pls advise if okay to be refilled thanks

## 2010-07-05 ENCOUNTER — Telehealth: Payer: Self-pay | Admitting: Pulmonary Disease

## 2010-07-05 MED ORDER — HYDROCODONE-HOMATROPINE 5-1.5 MG/5ML PO SYRP: 5.0000 mL | ORAL_SOLUTION | Freq: Four times a day (QID) | ORAL | Status: DC | PRN

## 2010-07-05 NOTE — Telephone Encounter (Signed)
One refill of the hycodan given to the pharmacist that requested

## 2010-07-05 NOTE — Telephone Encounter (Signed)
Okay to send one refill.

## 2010-07-06 ENCOUNTER — Emergency Department (HOSPITAL_COMMUNITY): Payer: Medicaid Other

## 2010-07-06 ENCOUNTER — Emergency Department (HOSPITAL_COMMUNITY)
Admission: EM | Admit: 2010-07-06 | Discharge: 2010-07-06 | Disposition: A | Discharge status: Home / Self Care | Payer: Medicaid Other | Attending: Emergency Medicine | Admitting: Emergency Medicine

## 2010-07-06 DIAGNOSIS — S62329A Displaced fracture of shaft of unspecified metacarpal bone, initial encounter for closed fracture: Secondary | ICD-10-CM | POA: Insufficient documentation

## 2010-07-06 DIAGNOSIS — M79609 Pain in unspecified limb: Secondary | ICD-10-CM | POA: Insufficient documentation

## 2010-07-06 DIAGNOSIS — IMO0002 Reserved for concepts with insufficient information to code with codable children: Secondary | ICD-10-CM | POA: Insufficient documentation

## 2010-07-06 DIAGNOSIS — M7989 Other specified soft tissue disorders: Secondary | ICD-10-CM | POA: Insufficient documentation

## 2010-07-06 DIAGNOSIS — I10 Essential (primary) hypertension: Secondary | ICD-10-CM | POA: Insufficient documentation

## 2010-07-06 DIAGNOSIS — Y92009 Unspecified place in unspecified non-institutional (private) residence as the place of occurrence of the external cause: Secondary | ICD-10-CM | POA: Insufficient documentation

## 2010-07-06 DIAGNOSIS — Z79899 Other long term (current) drug therapy: Secondary | ICD-10-CM | POA: Insufficient documentation

## 2010-07-08 NOTE — Telephone Encounter (Signed)
Hycodan refill sent to requested pharmacy by phone , unable to reach pt to let her know was told i had the wrong number. No prednisone refilled at this time per dr sood--this was done 07/05/10 unable to document at that time due to computer problems. Also called cell number and the person answered stating they did not know her either

## 2010-07-29 ENCOUNTER — Ambulatory Visit (HOSPITAL_BASED_OUTPATIENT_CLINIC_OR_DEPARTMENT_OTHER): Payer: Medicaid Other | Attending: Pulmonary Disease

## 2010-09-05 ENCOUNTER — Other Ambulatory Visit: Payer: Self-pay | Admitting: Pulmonary Disease

## 2010-09-12 ENCOUNTER — Ambulatory Visit (INDEPENDENT_AMBULATORY_CARE_PROVIDER_SITE_OTHER): Payer: Medicaid Other | Admitting: Pulmonary Disease

## 2010-09-12 ENCOUNTER — Encounter: Payer: Self-pay | Admitting: Pulmonary Disease

## 2010-09-12 VITALS — BP 118/70 | HR 65 | Temp 98.5°F | Ht 63.0 in | Wt 118.8 lb

## 2010-09-12 DIAGNOSIS — K219 Gastro-esophageal reflux disease without esophagitis: Secondary | ICD-10-CM

## 2010-09-12 DIAGNOSIS — J438 Other emphysema: Secondary | ICD-10-CM

## 2010-09-12 DIAGNOSIS — J984 Other disorders of lung: Secondary | ICD-10-CM

## 2010-09-12 DIAGNOSIS — R0609 Other forms of dyspnea: Secondary | ICD-10-CM

## 2010-09-12 DIAGNOSIS — F17200 Nicotine dependence, unspecified, uncomplicated: Secondary | ICD-10-CM

## 2010-09-12 DIAGNOSIS — R0683 Snoring: Secondary | ICD-10-CM

## 2010-09-12 MED ORDER — HYDROCODONE-HOMATROPINE 5-1.5 MG/5ML PO SYRP: 5.0000 mL | ORAL_SOLUTION | Freq: Four times a day (QID) | ORAL | Status: DC | PRN

## 2010-09-12 MED ORDER — OMEPRAZOLE 40 MG PO CPDR: 40.0000 mg | DELAYED_RELEASE_CAPSULE | Freq: Once | ORAL | Status: DC

## 2010-09-12 NOTE — Assessment & Plan Note (Signed)
She was not able to do in lab study.  Will arrange for home sleep test.

## 2010-09-12 NOTE — Assessment & Plan Note (Signed)
She is schedule for f/u CT chest later this month.

## 2010-09-12 NOTE — Progress Notes (Signed)
  Subjective:    Patient ID: Kimberly Kemp, female    DOB: May 01, 1965, 45 y.o.   MRN: [id number]  HPI 45 year old hx of Emphysema, rhinitis, and continued tobacco abuse.  She continues to have chest congestion, cough, Fiorito/black/green sputum.  She is not having sinus trouble.  She has been getting a scratchy throat, and bad reflux.  She is using her nebulizer 3 or 4 times per day, but not sure pulmicort helps much.  She has not been using singulair.  She denies fever.  She has body aches all over.  She continues to have trouble snoring and feeling sleepy.  She notices more trouble with her breathing while asleep.  She was not able to do her in-lab sleep study.  She did not have anybody to watch her son.   She is down to 4 cigarettes per day.  Review of Systems     Objective:   Physical Exam  BP 118/70  Pulse 65  Temp(Src) 98.5 F (36.9 C) (Oral)  Ht 5\' 3"  (1.6 m)  Wt 118 lb 12.8 oz (53.887 kg)  BMI 21.04 kg/m2  SpO2 100%  General: normal appearance and thin.  Nose: clear nasal discharge.  Mouth: no oral lesion  Neck: no JVD.  Lungs: prolonged exhalation, no wheezing, no rales, scattered rhonchi clear with cough  Heart: regular rhythm, normal rate, and no murmurs.  Abdomen: soft, nontender  Extremities: no clubbing, cyanosis, edema, or deformity noted  Neurologic: normal CN II-XII.  Cervical Nodes: no significant adenopathy  Psych: anxious.       Assessment & Plan:

## 2010-09-12 NOTE — Assessment & Plan Note (Signed)
Encouraged her to continue her smoking cessation efforts. 

## 2010-09-12 NOTE — Patient Instructions (Signed)
Will arrange for sleep test at home Will call with results of CT chest Stop pulmicort Follow up in 6 months

## 2010-09-12 NOTE — Assessment & Plan Note (Signed)
Will refill her prilosec.

## 2010-09-12 NOTE — Assessment & Plan Note (Signed)
She is not sure pulmicort is helping.  Will stop this.  She is to continue her other nebulizer medicines.  She does not need prednisone or antibiotics at this time.

## 2010-09-17 ENCOUNTER — Telehealth: Payer: Self-pay | Admitting: Pulmonary Disease

## 2010-09-17 ENCOUNTER — Ambulatory Visit (INDEPENDENT_AMBULATORY_CARE_PROVIDER_SITE_OTHER): Payer: Medicaid Other | Admitting: Pulmonary Disease

## 2010-09-17 DIAGNOSIS — R0989 Other specified symptoms and signs involving the circulatory and respiratory systems: Secondary | ICD-10-CM

## 2010-09-17 DIAGNOSIS — R0683 Snoring: Secondary | ICD-10-CM

## 2010-09-17 DIAGNOSIS — R0609 Other forms of dyspnea: Secondary | ICD-10-CM

## 2010-09-17 NOTE — Telephone Encounter (Signed)
Pt says she did the home sleep test on Thurs., 8/9 and would like to know the results. Pls advise.

## 2010-09-18 NOTE — Progress Notes (Signed)
Apnealink 09/13/10:  Test duration 5hrs 54 min. Average respiratory rate 21.  AHI 1,RDI 2.  All obstructive events. Average SpO2 96%, low SpO2 91%. Average heart rate 62 (range 53 to 86)

## 2010-09-18 NOTE — Telephone Encounter (Signed)
Test duration 5hrs 54 min. Average respiratory rate 21.  AHI 1,RDI 2.  All obstructive events. Average SpO2 96%, low SpO2 91%. Average heart rate 62 (range 53 to 86)  Results d/w pt over the phone.  Explained that sleep test did not show evidence for sleep apnea or nocturnal hypoxemia.  She is only sleeping 4 hours per night>>advised her to try to sleep more.  She wakes frequently when sleeping on her back.  Advised her to try positional therapy, and discussed how to do this.  Advised that she does not need additional sleep testing at this time.

## 2010-09-18 NOTE — Assessment & Plan Note (Signed)
No evidence of sleep apnea or nocturnal hypoxemia.  Advised patient to dedicate more time to sleep, and try positional therapy for possible UARS related to supine sleeping position.

## 2010-09-25 ENCOUNTER — Ambulatory Visit: Payer: Medicaid Other | Admitting: Physical Therapy

## 2010-09-30 ENCOUNTER — Ambulatory Visit: Payer: Medicaid Other | Admitting: Physical Therapy

## 2010-09-30 ENCOUNTER — Encounter: Payer: Self-pay | Admitting: Internal Medicine

## 2010-10-03 ENCOUNTER — Ambulatory Visit (INDEPENDENT_AMBULATORY_CARE_PROVIDER_SITE_OTHER)
Admission: RE | Admit: 2010-10-03 | Discharge: 2010-10-03 | Disposition: A | Discharge status: Home / Self Care | Payer: Medicaid Other | Source: Ambulatory Visit | Attending: Pulmonary Disease | Admitting: Pulmonary Disease

## 2010-10-03 DIAGNOSIS — J984 Other disorders of lung: Secondary | ICD-10-CM

## 2010-10-04 ENCOUNTER — Telehealth: Payer: Self-pay | Admitting: Pulmonary Disease

## 2010-10-04 DIAGNOSIS — J984 Other disorders of lung: Secondary | ICD-10-CM

## 2010-10-04 NOTE — Telephone Encounter (Signed)
CT chest 10/03/10>>RUL nodule no longer visible.  Tracheal density no longer visible.  Mild upper lobe emphysematous changes.  Will have my nurse inform pt that nodule in right long has resolved.  No further CT imaging is needed.  No change to current treatment plan.

## 2010-10-04 NOTE — Telephone Encounter (Signed)
[email or web address]  

## 2010-10-08 NOTE — Telephone Encounter (Signed)
[email or web address]  

## 2010-10-08 NOTE — Telephone Encounter (Signed)
Pt returned Mindy's call.  She was informed that the nodule in right lung has resolved per VS and no further CT imaging is needed.  Also, Dr. Craige Cotta recs no change to current treatment plan.  She verbalized understanding of these results and recs and voiced no further questions/concerns at this time.

## 2010-10-21 ENCOUNTER — Ambulatory Visit (HOSPITAL_COMMUNITY)
Admission: RE | Admit: 2010-10-21 | Discharge: 2010-10-21 | Disposition: A | Discharge status: Home / Self Care | Payer: Medicaid Other | Source: Ambulatory Visit | Attending: Orthopaedic Surgery | Admitting: Orthopaedic Surgery

## 2010-10-21 DIAGNOSIS — IMO0001 Reserved for inherently not codable concepts without codable children: Secondary | ICD-10-CM | POA: Insufficient documentation

## 2010-10-21 DIAGNOSIS — S62339A Displaced fracture of neck of unspecified metacarpal bone, initial encounter for closed fracture: Secondary | ICD-10-CM | POA: Insufficient documentation

## 2010-10-21 DIAGNOSIS — M6281 Muscle weakness (generalized): Secondary | ICD-10-CM | POA: Insufficient documentation

## 2010-10-21 DIAGNOSIS — I10 Essential (primary) hypertension: Secondary | ICD-10-CM | POA: Insufficient documentation

## 2010-10-21 DIAGNOSIS — M25549 Pain in joints of unspecified hand: Secondary | ICD-10-CM | POA: Insufficient documentation

## 2010-10-21 DIAGNOSIS — M25649 Stiffness of unspecified hand, not elsewhere classified: Secondary | ICD-10-CM | POA: Insufficient documentation

## 2010-10-21 DIAGNOSIS — M25449 Effusion, unspecified hand: Secondary | ICD-10-CM | POA: Insufficient documentation

## 2010-10-21 NOTE — Progress Notes (Signed)
Occupational Therapy Evaluation  Patient Details  Name: Kimberly Kemp MRN: [id number] Date of Birth: Jan 24, 1966  Today's Date: 10/21/2010 Time In 119 Time Out 150 OT Evaluation 119-132 Manual Therapy 133-145 NO CHARGE  Visit 1/16  Reassessment date: 11/18/10 Diagnosis:  S/P Right Metacarpal Neck Fracture of Right Small Finger Referring MD:  Dr. Hilda Lias Medicaid Authorized from 10/21/10-11/18/10 for 3 visits Medicaid visit # 0 of 3, begin counting next visit.  Please see scanned Medicaid Evaluation for full details   Past Medical History:  Past Medical History  Diagnosis Date  . Hypertension   . Emphysema   . GERD (gastroesophageal reflux disease)   . Chronic pain   . Tobacco abuse   . Anxiety   . Pulmonary nodule, right     upper lobe; CT Chest Feb 2012  . Paroxysmal atrial fibrillation   . Cirrhosis    Past Surgical History:  Past Surgical History  Procedure Date  . Bronchoscopy March 2008, Feb 2012    Subjective Symptoms/Limitations Symptoms: S:  I fell on my hand and fractured a bone.  I want to get my hand moving again. Limitations: Pertinent History:  Kimberly Kemp states that she fell, landing on her right hand on 07/06/10.  She went to the emergency room and was referred to Dr. Hilda Lias, who diagnosed her with a metacarpal fracture of her right fifth finger.  Her hand was placed in a cast x 3 months.  The cast was removed 09/11/10.  She went to [facility] for therapy, but was referred to [facility].  [name] said they could not work with her due to her insurance.  She has been referred to occupational therapy for evaluation and treatment. Pain Assessment Currently in Pain?: Yes Pain Score:   8 Pain Location: Hand Pain Orientation: Right Pain Type: Acute pain   Assessment Sensation Additional Comments:  (edema:  SF PIPJ:  right 5.0 cm left 4.5 cm) RUE Assessment RUE Assessment:  (unable to adduct small finger) RUE AROM (degrees) Right Composite Finger Flexion:  (Flex RF:   60/82/30 SF:  30/40/42) RUE Strength Gross Grasp:  (grip and pinch (thumb&SF) R:  45/2, L:  75/6.)   Exercise/Treatments Fine Motor Coordination Tendon Glides: educated on for HEP Hand Exercises Joint Blocking Exercises:  (educated on for HEP) Tendon Glides: educated on for HEP Fine Motor Coordination Tendon Glides: educated on for HEP Manual Therapy Manual Therapy: Myofascial release Myofascial Release: MFR and manual stretching to flexor and extensor forearm, palm and ring and small fingers to decrease restrictions and increase AROM and functional use of right hand.   Goals Short Term Goals (4 weeks) Short Term Goal 1: Patient will be educated on HEP. Short Term Goal 2: Patient will increase PROM to [facility] in right hand for increased ability to hold onto a cup and write. Short Term Goal 3: Patient will increase right grip strength by 15 pounds and pinch strength by 2 pounds for increased independence with opening containers and squeezing washcloths. Short Term Goal 4: Patient will decrease pain level to 4/10 in her right hand during daily activities. Short Term Goal 5: Patient will decrease edema by .25 cm in her right small finger. Long Term Goals (8 weeks) Long Term Goal 1: Patient will return to prior level of independence with ADLs. Long Term Goal 2: Patient will increase right hand AROM to WNL for increased ability to grip a cup and wring out a washcloth. Long Term Goal 3: Patient will increase right hand grip strength to  65 pounds or better and increase pinch strength to 5 pounds or better for increased ability to open doors and squeeze washcloths. Long Term Goal 4: Patient will decrease edema by .5 cm in her right hand. Long Term Goal 5: Patient will decrease pain to 2/10 in her righ hand during functional tasks. Additional Long Term Goals?: Yes Long Term Goal 6: Patient will decrease fascial restrictions to trace in her right hand. End of Session Patient Active Problem List    Diagnoses  . ANXIETY  . TOBACCO ABUSE  . RHINITIS  . GERD  . EMPHYSEMA  . PULMONARY NODULE  . Snoring  . Pain in joint, hand  . Closed fracture of neck of metacarpal bone(s)  . Muscle weakness (generalized)   OT Assessment and Plan Clinical Impression Statement: A:  Patient presents with increased stiffness, fascial restrictions, pain, and edema, causing decreased AROM, strength, and functional use of her right hand with ADLs. OT Frequency: Min 2X/week OT Duration: 8 weeks OT Treatment/Interventions: Self-care/ADL training;Therapeutic exercise;Manual therapy;Patient/family education OT Plan: P:  Skilled occupational therapy to decrease pain, restrictions, and edema while increasing AROM and strength.  Treatment Plan:  MFR and manual stretching with gentle joint mobs to right hand and flexor  and extensor forearm.  Ther Ex:  tendon glides, joint blocking, to ring and small digits, digit ext, digit abd/add, sponges, yellow tputty.  progress as tolerated.     Shirlean Mylar, OTR/L  10/21/2010, 3:44 PM

## 2010-10-23 ENCOUNTER — Inpatient Hospital Stay (HOSPITAL_COMMUNITY): Admission: RE | Admit: 2010-10-23 | Payer: Medicaid Other | Source: Ambulatory Visit | Admitting: Specialist

## 2010-10-29 ENCOUNTER — Ambulatory Visit (HOSPITAL_COMMUNITY): Payer: Medicaid Other | Admitting: Specialist

## 2010-10-31 ENCOUNTER — Inpatient Hospital Stay (HOSPITAL_COMMUNITY): Admission: RE | Admit: 2010-10-31 | Payer: Medicaid Other | Source: Ambulatory Visit | Admitting: Specialist

## 2010-11-05 ENCOUNTER — Telehealth: Payer: Self-pay | Admitting: Pulmonary Disease

## 2010-11-05 DIAGNOSIS — J438 Other emphysema: Secondary | ICD-10-CM

## 2010-11-05 NOTE — Telephone Encounter (Signed)
[email or web address]  

## 2010-11-05 NOTE — Telephone Encounter (Signed)
I spoke with pt and she states her nebulizer machine is smoking from the back when she turns it on and it won't "pump her medicine through". Pt states she has had her nebulizer machine for many years now and does not have a DME company. Will send order for pt to get a new cpap machine. Please advise pcc's thanks  Carver Fila, CMA

## 2010-11-05 NOTE — Telephone Encounter (Signed)
Order faxed to [facility] to provide pt with a new nebulizer. Spoke with patient and she is aware that [facility] will bring the nebulizer out to her today. Rhonda J Cobb

## 2010-11-13 ENCOUNTER — Ambulatory Visit (HOSPITAL_COMMUNITY)
Admission: RE | Admit: 2010-11-13 | Discharge: 2010-11-13 | Disposition: A | Discharge status: Home / Self Care | Payer: Medicaid Other | Source: Ambulatory Visit | Attending: Orthopaedic Surgery | Admitting: Orthopaedic Surgery

## 2010-11-13 DIAGNOSIS — IMO0001 Reserved for inherently not codable concepts without codable children: Secondary | ICD-10-CM | POA: Insufficient documentation

## 2010-11-13 DIAGNOSIS — M6281 Muscle weakness (generalized): Secondary | ICD-10-CM | POA: Insufficient documentation

## 2010-11-13 DIAGNOSIS — M25549 Pain in joints of unspecified hand: Secondary | ICD-10-CM

## 2010-11-13 DIAGNOSIS — M25649 Stiffness of unspecified hand, not elsewhere classified: Secondary | ICD-10-CM | POA: Insufficient documentation

## 2010-11-13 DIAGNOSIS — S62339A Displaced fracture of neck of unspecified metacarpal bone, initial encounter for closed fracture: Secondary | ICD-10-CM

## 2010-11-13 DIAGNOSIS — M25449 Effusion, unspecified hand: Secondary | ICD-10-CM | POA: Insufficient documentation

## 2010-11-13 DIAGNOSIS — I10 Essential (primary) hypertension: Secondary | ICD-10-CM | POA: Insufficient documentation

## 2010-11-13 NOTE — Progress Notes (Signed)
Occupational Therapy Treatment  Patient Details  Name: Kimberly Kemp MRN: [id number] Date of Birth: 1965/04/24  Today's Date: 11/13/2010 Time: 4098-1191 Time Calculation (min): 54 min Manual Therapy 938 -1000 22' Therapeutic Exercises [phone] 28' Visit#: 2  of 8   Re-eval: 11/18/10  Diagnosis: S/P Right Metacarpal Neck Fracture of Right Small Finger  Referring MD: Dr. Hilda Lias  Medicaid Authorized from 10/21/10-11/18/10 for 3 visits  Medicaid visit # 1 of 3   Subjective Symptoms/Limitations Symptoms: S:  I havent been able to come because of car trouble.  I can use it better, I just can't close it. Pain Assessment Currently in Pain?: Yes Pain Score:   5 Pain Location: Hand Pain Orientation: Right Pain Type: Acute pain  O:  Exercise/Treatments Wrist Exercises Wrist Flexion: PROM;10 reps Wrist Extension: PROM;10 reps   Sponges: 23, 15, 17 Purdue Pegboard: grooved pegboard with RF and SF Theraputty - Flatten: yellow Theraputty - Roll: yellow Theraputty - Grip: yellow Theraputty - Locate Pegs: yellow 10 beads   Hand Exercises MCPJ Flexion: PROM;5 reps PIPJ Flexion: PROM;5 reps DIPJ Flexion: PROM;5 reps Theraputty - Flatten: yellow Theraputty - Roll: yellow Theraputty - Grip: yellow Theraputty - Locate Pegs: yellow 10 beads Sponges: 23, 15, 17 Purdue Pegboard: grooved pegboard with RF and SF Tendon Glides: 10 reps  Manual Therapy Manual Therapy: Myofascial release Myofascial Release: MFR and manual stretching to flexor and extensor forearm, wrist, palm, and dorsal hand and RF and SF to decrease restrictions and increase ability to make a fist. [phone]  Occupational Therapy Assessment and Plan OT Assessment and Plan Clinical Impression Statement: A:  Patient able to make a full fist and maintain fist after MFR this date. OT Plan: P:  Reassess for monthly progress note and resubmit medicaid, increase resistance with exercises.   Goals Short Term Goals Short Term  Goal 1: Patient will be educated on HEP. Short Term Goal 1 Progress: Progressing toward goal Short Term Goal 2: Patient will increase PROM to [facility] in right hand for increased ability to hold onto a cup and write. Short Term Goal 2 Progress: Progressing toward goal Short Term Goal 3: Patient will increase right grip strength by 15 pounds and pinch strength by 2 pounds for increased independence with opening containers and squeezing washcloths. Short Term Goal 3 Progress: Progressing toward goal Short Term Goal 4: Patient will decrease pain level to 4/10 in her right hand during daily activities. Short Term Goal 4 Progress: Progressing toward goal Short Term Goal 5: Patient will decrease edema by .25 cm in her right small finger. Short Term Goal 5 Progress: Progressing toward goal Long Term Goals Long Term Goal 1: Patient will return to prior level of independence with ADLs. Long Term Goal 1 Progress: Progressing toward goal Long Term Goal 2: Patient will increase right hand AROM to WNL for increased ability to grip a cup and wring out a washcloth. Long Term Goal 2 Progress: Progressing toward goal Long Term Goal 3: Patient will increase right hand grip strength to 65 pounds or better and increase pinch strength to 5 pounds or better for increased ability to open doors and squeeze washcloths. Long Term Goal 3 Progress: Progressing toward goal Long Term Goal 4: Patient will decrease edema by .5 cm in her right hand. Long Term Goal 4 Progress: Progressing toward goal Long Term Goal 5: Patient will decrease pain to 2/10 in her righ hand during functional tasks. Long Term Goal 5 Progress: Progressing toward goal Additional Long Term Goals?: Yes  Long Term Goal 6: Patient will decrease fascial restrictions to trace in her right hand. End of Session Patient Active Problem List  Diagnoses  . ANXIETY  . TOBACCO ABUSE  . RHINITIS  . GERD  . EMPHYSEMA  . PULMONARY NODULE  . Snoring  . Pain in  joint, hand  . Closed fracture of neck of metacarpal bone(s)  . Muscle weakness (generalized)   End of Session Activity Tolerance: Patient tolerated treatment well General Behavior During Session: [facility] for tasks performed Cognition: [facility] for tasks performed   Shirlean Mylar, OTR/L  11/13/2010, 12:01 PM

## 2010-11-18 LAB — CBC
HCT: 35 — ABNORMAL LOW
HCT: 43.5
Hemoglobin: 12.5
MCV: 92.1
Platelets: 248
RBC: 4.01
RBC: 4.73
RDW: 15.1 — ABNORMAL HIGH
WBC: 17.9 — ABNORMAL HIGH
WBC: 24.7 — ABNORMAL HIGH
WBC: 17.7 — ABNORMAL HIGH

## 2010-11-18 LAB — I-STAT 8, (EC8 V) (CONVERTED LAB)
Acid-base deficit: 5 — ABNORMAL HIGH
Potassium: 3.2 — ABNORMAL LOW
Sodium: 139
TCO2: 18
pH, Ven: 7.437 — ABNORMAL HIGH

## 2010-11-18 LAB — BLOOD GAS, ARTERIAL
Bicarbonate: 18 — ABNORMAL LOW
O2 Content: 3
Patient temperature: 98.6
pCO2 arterial: 26.1 — ABNORMAL LOW
pH, Arterial: 7.453 — ABNORMAL HIGH

## 2010-11-18 LAB — DIFFERENTIAL
Eosinophils Absolute: 0
Eosinophils Relative: 0
Lymphs Abs: 1.4
Monocytes Relative: 4

## 2010-11-18 LAB — BASIC METABOLIC PANEL
BUN: 14
Calcium: 9.6
Chloride: 109
Creatinine, Ser: 0.78
GFR calc Af Amer: >60
GFR calc Af Amer: >60
GFR calc Af Amer: >60
GFR calc non Af Amer: >60
GFR calc non Af Amer: >60
Glucose, Bld: 147 — ABNORMAL HIGH
Potassium: 3.9
Potassium: 4.1
Potassium: 3.1 — ABNORMAL LOW
Sodium: 137

## 2010-11-18 LAB — POCT I-STAT CREATININE
Creatinine, Ser: 0.9
Operator id: 277751

## 2010-11-18 LAB — POCT CARDIAC MARKERS: Myoglobin, poc: 382

## 2010-11-18 LAB — EXPECTORATED SPUTUM ASSESSMENT W GRAM STAIN, RFLX TO RESP C

## 2010-11-22 ENCOUNTER — Ambulatory Visit (HOSPITAL_COMMUNITY)
Admission: RE | Admit: 2010-11-22 | Discharge: 2010-11-22 | Disposition: A | Discharge status: Home / Self Care | Payer: Medicaid Other | Source: Ambulatory Visit | Attending: Occupational Therapy | Admitting: Occupational Therapy

## 2010-11-22 DIAGNOSIS — M25549 Pain in joints of unspecified hand: Secondary | ICD-10-CM

## 2010-11-22 DIAGNOSIS — M6281 Muscle weakness (generalized): Secondary | ICD-10-CM

## 2010-11-22 DIAGNOSIS — S62339A Displaced fracture of neck of unspecified metacarpal bone, initial encounter for closed fracture: Secondary | ICD-10-CM

## 2010-11-22 NOTE — Progress Notes (Signed)
Occupational Therapy Treatment  Patient Details  Name: Kimberly Kemp MRN: [id number] Date of Birth: 07-27-1965  Today's Date: 11/22/2010 Time: 4098-1191 Time Calculation (min): 69 min Visit#: 3  of 8   Re-eval: 12/16/10 [facility] Therapy  150-225  35' Remeasure 226-235  9' Therapeutic Exercise  [phone]  17' Ultrasound  254-259  5'   Subjective Symptoms/Limitations Symptoms: S:  It throbs all the time and goes numb if I try to use it. Pain Assessment Currently in Pain?: Yes Pain Score:   8 Pain Location: Hand Pain Orientation: Right Pain Type: Acute pain  Exercise/Treatments Wrist Exercises Wrist Flexion: PROM;10 reps Wrist Extension: PROM;10 reps   Hand Exercises MCPJ Flexion: PROM;5 reps PIPJ Flexion: PROM;5 reps DIPJ Flexion: PROM;5 reps Theraputty - Flatten: yellow Theraputty - Roll: yellow Theraputty - Grip: yellow Theraputty - Locate Pegs: yellow 10 beads Sponges: 19 Tendon Glides: 10 reps   Modalities Modalities: Ultrasound Manual Therapy Manual Therapy: Myofascial release Myofascial Release: MFR and manual stretching to flexor and extensor forearm, palm and ring and small fingers to decrease restrictions and increase AROM and functional use of right hand. Ultrasound Ultrasound Location: palm of right hand. Ultrasound Parameters: 1.5 continuous for 5 min. with .  Ultrasound Goals: Pain  Occupational Therapy Assessment and Plan OT Assessment and Plan Clinical Impression Statement: A:  See progress note.  Ultrasound added to decrease pain. Rehab Potential: Excellent OT Plan: P:  Continue to 2x a week for 4 weeks.   Goals Short Term Goals Short Term Goal 1: Patient will be educated on HEP. Short Term Goal 1 Progress: Progressing toward goal Short Term Goal 2: Patient will increase PROM to [facility] in right hand for increased ability to hold onto a cup and write. Short Term Goal 2 Progress: Progressing toward goal Short Term Goal 3: Patient will increase  right grip strength by 15 pounds and pinch strength by 2 pounds for increased independence with opening containers and squeezing washcloths. Short Term Goal 3 Progress: Other (comment) Short Term Goal 4: Patient will decrease pain level to 4/10 in her right hand during daily activities. Short Term Goal 4 Progress: Progressing toward goal Short Term Goal 5: Patient will decrease edema by .25 cm in her right small finger. Short Term Goal 5 Progress: Progressing toward goal Long Term Goals Long Term Goal 1: Patient will return to prior level of independence with ADLs. Long Term Goal 1 Progress: Progressing toward goal Long Term Goal 2: Patient will increase right hand AROM to WNL for increased ability to grip a cup and wring out a washcloth. Long Term Goal 2 Progress: Progressing toward goal Long Term Goal 3: Patient will increase right hand grip strength to 65 pounds or better and increase pinch strength to 5 pounds or better for increased ability to open doors and squeeze washcloths. Long Term Goal 3 Progress: Progressing toward goal Long Term Goal 4: Patient will decrease edema by .5 cm in her right hand. Long Term Goal 4 Progress: Progressing toward goal Long Term Goal 5: Patient will decrease pain to 2/10 in her righ hand during functional tasks. Additional Long Term Goals?: Yes Long Term Goal 6: Patient will decrease fascial restrictions to trace in her right hand. Long Term Goal 6 Progress: Progressing toward goal End of Session Patient Active Problem List  Diagnoses  . ANXIETY  . TOBACCO ABUSE  . RHINITIS  . GERD  . EMPHYSEMA  . PULMONARY NODULE  . Snoring  . Pain in joint, hand  . Closed  fracture of neck of metacarpal bone(s)  . Muscle weakness (generalized)   End of Session Activity Tolerance: Patient tolerated treatment well   Marcell Pfeifer L. Ranay Ketter, COTA/L  11/22/2010, 4:57 PM

## 2010-12-02 ENCOUNTER — Inpatient Hospital Stay (HOSPITAL_COMMUNITY): Admission: RE | Admit: 2010-12-02 | Payer: Medicaid Other | Source: Ambulatory Visit | Admitting: Occupational Therapy

## 2010-12-05 ENCOUNTER — Telehealth: Payer: Self-pay | Admitting: Pulmonary Disease

## 2010-12-05 MED ORDER — ALBUTEROL SULFATE HFA 108 (90 BASE) MCG/ACT IN AERS: 2.0000 | INHALATION_SPRAY | RESPIRATORY_TRACT | Status: DC | PRN

## 2010-12-05 NOTE — Telephone Encounter (Signed)
Pt c/o cough prod yellow/green, chest tight, no fever, symptoms are on going and she states dr Craige Cotta is aware of this and she would like for him to call in cough syrup for her and she is aware he is out of the office until Monday afternoon 11/5. Pt refused ov states she is unable to get transportation to the office advised pt to get a cough syrup called in we would need to see her again she requested i send msg to dr sood and she is willing to wait till Monday for this, i did rec OTC delsym in the mean time. i did tell pt i would go ahead and send the albuterol inhaler in for her and we would call her on Monday with his response about the cough syrup. Pt fine with this--albuterol sent to pharmacy

## 2010-12-07 NOTE — Telephone Encounter (Signed)
  Ok to send refill for hycodan 5-1.5 mg/53ml syrup, sig: 5 ml by mouth every 6 hours as needed for cough, dispense 120 ml with one refill.

## 2010-12-09 NOTE — Telephone Encounter (Signed)
Patient aware.

## 2010-12-09 NOTE — Telephone Encounter (Signed)
RX for Hycodan called to [facility] in [place] per [facility] instructions. LMOMTCB x 1 so pt can be informed.

## 2010-12-21 ENCOUNTER — Telehealth: Payer: Self-pay | Admitting: Pulmonary Disease

## 2010-12-21 NOTE — Telephone Encounter (Signed)
Pt called sat 12/21/10 at 1:30PM w/ c/o sore throat, hoarse, & denied fever chills sweats etc... She noted cough w/ some yellow sput but not unlke that described in DrSood's old notes...  She does not have a primary care physician & DrSood has her on meds listed...   REC> Rx for MMW [facility] [place] at [phone].  Also rec to get OTC Mucinex & take 2Bid w/ plenty of fluids... She is instructed to call DrSood for a follow up this week... She still smokes & is encouraged to quit completely

## 2011-01-03 ENCOUNTER — Other Ambulatory Visit: Payer: Self-pay | Admitting: Pulmonary Disease

## 2011-01-20 ENCOUNTER — Telehealth: Payer: Self-pay | Admitting: Pulmonary Disease

## 2011-01-20 NOTE — Telephone Encounter (Signed)
LMTCBx1. According to last OV note the pt was to stop the pulmicort?  Carron Curie, CMA

## 2011-01-21 ENCOUNTER — Other Ambulatory Visit: Payer: Self-pay | Admitting: Pulmonary Disease

## 2011-01-21 NOTE — Telephone Encounter (Signed)
At 09/12/10 Ov pt was instructed to stop her pulmicort. Pt says she has never stopped this medication and feels she can't do without it. She also has her albuterol and atrovent for the nebulizer. Pt aware we will have to speak with Dr. Craige Cotta before we can refill this medication. Dr. Craige Cotta, pls advise.

## 2011-01-21 NOTE — Telephone Encounter (Signed)
Per pt's chart, budesonide neb soln discontinued at 8.9.12 ov with VS.  This has been written on the faxed refill request and faxed back to [facility] @ [phone].

## 2011-01-22 NOTE — Telephone Encounter (Signed)
Okay to send refill for pulmicort 0.25 mg nebulized bid, dispense 60 with 5 refills.  Okay to send refill for ipratropium 0.2% 500 mcg nebulized four times per day prn, dispense one month supply with 5 refills.

## 2011-01-22 NOTE — Telephone Encounter (Signed)
Pt would like an update.  [phone].  Kimberly Kemp

## 2011-01-22 NOTE — Telephone Encounter (Signed)
I spoke with pt and made her aware we are waiting for the okay form VS to fill her medications for her.

## 2011-01-23 MED ORDER — IPRATROPIUM BROMIDE 0.02 % IN SOLN: 500.0000 ug | Freq: Four times a day (QID) | RESPIRATORY_TRACT | Status: DC | PRN

## 2011-01-23 MED ORDER — BUDESONIDE 0.25 MG/2ML IN SUSP: 0.2500 mg | Freq: Two times a day (BID) | RESPIRATORY_TRACT | Status: DC

## 2011-01-23 NOTE — Telephone Encounter (Signed)
lmomtcb to make pt aware of meds sent in to the pharmacy.

## 2011-01-24 NOTE — Telephone Encounter (Signed)
Left message on named machine informing pt that "requested refills have been sent to her pharmacy."  Encouraged pt to call with any questions/concerns.  Will sign off.

## 2011-01-30 ENCOUNTER — Telehealth: Payer: Self-pay | Admitting: Pulmonary Disease

## 2011-01-30 MED ORDER — BUDESONIDE 0.25 MG/2ML IN SUSP: 0.2500 mg | Freq: Two times a day (BID) | RESPIRATORY_TRACT | Status: DC

## 2011-01-30 NOTE — Telephone Encounter (Signed)
Called for budesonide PA with Medicaid and was told that Budesonide is non-preferred but that brand name Pulmicort is covered. A new prescription will be sent to the pharmacy for brand name Pulmicort. Pharmacy notified. LMTCB x 1 so pt can be informed of the change to brand name pulmicort.

## 2011-01-31 NOTE — Telephone Encounter (Signed)
Pt is aware rx was sent 

## 2011-04-04 ENCOUNTER — Telehealth: Payer: Self-pay | Admitting: Pulmonary Disease

## 2011-04-04 ENCOUNTER — Other Ambulatory Visit: Payer: Self-pay | Admitting: Pulmonary Disease

## 2011-04-04 DIAGNOSIS — J209 Acute bronchitis, unspecified: Secondary | ICD-10-CM

## 2011-04-04 DIAGNOSIS — J438 Other emphysema: Secondary | ICD-10-CM

## 2011-04-04 MED ORDER — HYDROCODONE-HOMATROPINE 5-1.5 MG/5ML PO SYRP: 5.0000 mL | ORAL_SOLUTION | Freq: Four times a day (QID) | ORAL | Status: DC | PRN

## 2011-04-04 MED ORDER — DOXYCYCLINE HYCLATE 100 MG PO CAPS: 100.0000 mg | ORAL_CAPSULE | Freq: Two times a day (BID) | ORAL | Status: AC

## 2011-04-04 NOTE — Telephone Encounter (Signed)
[facility] cough syrup [facility] [place]. Pharmacist made aware of electronic RX for doxycycline.

## 2011-04-04 NOTE — Telephone Encounter (Signed)
Spoke with pt on phone.  She has cough with green sputum for past 5 weeks.  She has several family members at home who have been sick.  She has some wheeze.  She has not smoked in past few days.  She denies abdominal symptoms or fever.    I have sent script for doxycycline.  Will have triage nurse send refill for hycodan 5 ml q6h prn, dispense 120 ml with 1 refill.  Advised her to call back or go to ER if no improvement.  She has ROV scheduled next week already.

## 2011-04-04 NOTE — Telephone Encounter (Signed)
Pt c/o increased sob, throat feels swollen and painful on the left side, trouble swallowing du to swelling, cough with greenish mucus, chest discomfort and back pain. Having chills but unsure if she has a fever. Pt says symptoms have gradually gotten worse over the last 5 weeks. Will send to Dr. Craige Cotta for recs. Pls advise. Allergies  Allergen Reactions  . Penicillins

## 2011-04-07 ENCOUNTER — Ambulatory Visit (INDEPENDENT_AMBULATORY_CARE_PROVIDER_SITE_OTHER): Payer: Medicare Other | Admitting: Pulmonary Disease

## 2011-04-07 ENCOUNTER — Encounter: Payer: Self-pay | Admitting: Pulmonary Disease

## 2011-04-07 DIAGNOSIS — J209 Acute bronchitis, unspecified: Secondary | ICD-10-CM | POA: Insufficient documentation

## 2011-04-07 DIAGNOSIS — J438 Other emphysema: Secondary | ICD-10-CM

## 2011-04-07 DIAGNOSIS — F17200 Nicotine dependence, unspecified, uncomplicated: Secondary | ICD-10-CM

## 2011-04-07 NOTE — Assessment & Plan Note (Signed)
She has improvement with antibiotic therapy.  I advised her to finish course of doxycycline.  I don't think she needs prednisone or chest xray at this time.  She is to call if her symptoms get worse after finishing antibiotics.

## 2011-04-07 NOTE — Patient Instructions (Signed)
Finish prescription for doxycycline Follow up in 6 months Call if help needed sooner

## 2011-04-07 NOTE — Assessment & Plan Note (Signed)
She is to continue pulmicort, albuterol, and ipratropium nebulizer.

## 2011-04-07 NOTE — Progress Notes (Signed)
Chief Complaint  Patient presents with  . acute visit    Pt c/o increased sob, dry cough, hoarseness and trouble swallowing x 5 weeks.  Left side of neck is swollen and she has anusea.    History of Present Illness: Kimberly Kemp is a 46 y.o. female with Emphysema, rhinitis, and continued tobacco abuse.  She started antibiotics over the weekend.  She feels this has helped.  She still has cough with green sputum and small streaks of blood.  She gets some wheeze, but this is better after using nebulizer therapy.  She has some soreness in her left neck.  She has some sinus congestion, but this is getting better.  She gets nausea when she has a coughing spell.  She has not smoked in the past 4 days.   Past Medical History  Diagnosis Date  . Hypertension   . Emphysema   . GERD (gastroesophageal reflux disease)   . Chronic pain   . Tobacco abuse   . Anxiety   . Paroxysmal atrial fibrillation   . Cirrhosis     Past Surgical History  Procedure Date  . Bronchoscopy March 2008, Feb 2012    Allergies  Allergen Reactions  . Penicillins     Physical Exam:  Blood pressure 160/82, pulse 94, temperature 98.5 F (36.9 C), temperature source Oral, height 5\' 3"  (1.6 m), weight 115 lb 6.4 oz (52.345 kg), SpO2 99.00%. Body mass index is 20.44 kg/(m^2). Wt Readings from Last 2 Encounters:  04/07/11 115 lb 6.4 oz (52.345 kg)  09/12/10 118 lb 12.8 oz (53.887 kg)    General - No distress HEENT - No sinus tenderness, no oral exudate, TM clear b/l, no LAN Cardiac - s1s2 regular Chest - no wheeze/rales/dullness Abdomen - soft, nontender Extremities - no edema Skin - no rashes Neurologic - normal strength Psychiatric - anxious   Assessment/Plan:  Outpatient Encounter Prescriptions as of 04/07/2011  Medication Sig Dispense Refill  . albuterol (PROVENTIL) (2.5 MG/3ML) 0.083% nebulizer solution TAKE 3 MLS BY NEBULIZATION EVERY 6 HOURS AS NEEDED FOR WHEEZING  375 mL  3  . albuterol (VENTOLIN  HFA) 108 (90 BASE) MCG/ACT inhaler Inhale 2 puffs into the lungs every 4 (four) hours as needed for wheezing. Up to four times daily as needed  1 Inhaler  11  . budesonide (PULMICORT) 0.25 MG/2ML nebulizer solution Take 2 mLs (0.25 mg total) by nebulization 2 (two) times daily. DX:  492.8  BRAND MEDICALLY NECESSARY  60 mL  5  . dextromethorphan-guaiFENesin (MUCINEX DM) 30-600 MG per 12 hr tablet Take 1 tablet by mouth every 12 (twelve) hours.        Marland Kitchen doxycycline (VIBRAMYCIN) 100 MG capsule Take 1 capsule (100 mg total) by mouth 2 (two) times daily.  20 capsule  0  . HYDROcodone-homatropine (HYCODAN) 5-1.5 MG/5ML syrup Take 5 mLs by mouth every 6 (six) hours as needed. For cough  120 mL  1  . ipratropium (ATROVENT) 0.02 % nebulizer solution Take 2.5 mLs (500 mcg total) by nebulization 4 (four) times daily as needed.  300 mL  5  . Nutritional Supplements (ENSURE PLUS PO) Take by mouth 3 (three) times daily.        Marland Kitchen nystatin (MYCOSTATIN) 100000 UNIT/ML suspension TAKE 1 TEASPOONFUL GARGLE AND SWALLOW 4 TIMES A DAY  120 mL  1  . omeprazole (PRILOSEC) 40 MG capsule Take 1 capsule (40 mg total) by mouth once.  30 capsule  6    Cher Egnor Pager:  [phone] 04/07/2011, 2:48 PM

## 2011-04-07 NOTE — Assessment & Plan Note (Signed)
She has not smoked for the past 4 days.  Encouraged her to try staying off cigarettes completely.

## 2011-04-08 ENCOUNTER — Telehealth: Payer: Self-pay | Admitting: Pulmonary Disease

## 2011-04-08 MED ORDER — ALBUTEROL SULFATE (2.5 MG/3ML) 0.083% IN NEBU: 2.5000 mg | INHALATION_SOLUTION | Freq: Four times a day (QID) | RESPIRATORY_TRACT | Status: DC | PRN

## 2011-04-08 NOTE — Telephone Encounter (Signed)
rx printed and sent to dr sood to sign.

## 2011-04-10 MED ORDER — ALBUTEROL SULFATE (2.5 MG/3ML) 0.083% IN NEBU: 2.5000 mg | INHALATION_SOLUTION | Freq: Four times a day (QID) | RESPIRATORY_TRACT | Status: DC | PRN

## 2011-04-10 NOTE — Telephone Encounter (Signed)
Script signed.

## 2011-05-04 ENCOUNTER — Other Ambulatory Visit: Payer: Self-pay | Admitting: Pulmonary Disease

## 2011-05-08 ENCOUNTER — Other Ambulatory Visit: Payer: Self-pay | Admitting: Pulmonary Disease

## 2011-05-08 NOTE — Telephone Encounter (Signed)
DUPLICATE RX SENT

## 2011-05-22 ENCOUNTER — Other Ambulatory Visit (INDEPENDENT_AMBULATORY_CARE_PROVIDER_SITE_OTHER): Payer: Medicaid Other

## 2011-05-22 ENCOUNTER — Ambulatory Visit (INDEPENDENT_AMBULATORY_CARE_PROVIDER_SITE_OTHER)
Admission: RE | Admit: 2011-05-22 | Discharge: 2011-05-22 | Disposition: A | Discharge status: Home / Self Care | Payer: Medicaid Other | Source: Ambulatory Visit | Attending: Pulmonary Disease | Admitting: Pulmonary Disease

## 2011-05-22 ENCOUNTER — Ambulatory Visit (INDEPENDENT_AMBULATORY_CARE_PROVIDER_SITE_OTHER): Payer: Medicaid Other | Admitting: Pulmonary Disease

## 2011-05-22 ENCOUNTER — Telehealth: Payer: Self-pay | Admitting: Pulmonary Disease

## 2011-05-22 ENCOUNTER — Encounter: Payer: Self-pay | Admitting: Pulmonary Disease

## 2011-05-22 VITALS — BP 142/98 | HR 102 | Temp 99.0°F | Ht 63.0 in | Wt 109.6 lb

## 2011-05-22 DIAGNOSIS — J019 Acute sinusitis, unspecified: Secondary | ICD-10-CM

## 2011-05-22 DIAGNOSIS — J438 Other emphysema: Secondary | ICD-10-CM

## 2011-05-22 LAB — COMPREHENSIVE METABOLIC PANEL
Albumin: 4.6 g/dL (ref 3.5–5.2)
BUN: 17 mg/dL (ref 6–23)
CO2: 21 mEq/L (ref 19–32)
Calcium: 9.2 mg/dL (ref 8.4–10.5)
Chloride: 108 mEq/L (ref 96–112)
Creatinine, Ser: 0.9 mg/dL (ref 0.4–1.2)
GFR: 74.56 mL/min (ref >60.00)
Glucose, Bld: 105 mg/dL — ABNORMAL HIGH (ref 70–99)

## 2011-05-22 LAB — CBC WITH DIFFERENTIAL/PLATELET
Basophils Relative: 0.8 % (ref 0.0–3.0)
Eosinophils Absolute: 0.1 10*3/uL (ref 0.0–0.7)
Eosinophils Relative: 0.6 % (ref 0.0–5.0)
Hemoglobin: 14.4 g/dL (ref 12.0–15.0)
Lymphocytes Relative: 20.7 % (ref 12.0–46.0)
MCHC: 33.9 g/dL (ref 30.0–36.0)
Monocytes Relative: 8.1 % (ref 3.0–12.0)
Neutro Abs: 6 10*3/uL (ref 1.4–7.7)
RBC: 4.54 Mil/uL (ref 3.87–5.11)
WBC: 8.6 10*3/uL (ref 4.5–10.5)

## 2011-05-22 MED ORDER — AZITHROMYCIN 500 MG PO TABS: 500.0000 mg | ORAL_TABLET | Freq: Every day | ORAL | Status: AC

## 2011-05-22 NOTE — Assessment & Plan Note (Addendum)
Will give course of zithromax.  Will check labs and chest xray also.

## 2011-05-22 NOTE — Progress Notes (Signed)
Chief Complaint  Patient presents with  . Acute visit    Pt c/o increase SOB w/ exertion and some at rest x couple weeks--worse in the afternoons and feels like at night she is being "pulled apart". c/o bad dry cough, wheezing, chest tx, chest congestion, nausea, feels weak and tired    History of Present Illness: Kimberly Kemp is a 46 y.o. female with Emphysema, rhinitis, and continued tobacco abuse.  She was doing better when on antibiotics, but her symptoms got worse after she stopped antibiotics.  She feels less energy and tired all the time.  She is having sinus congestion, cough with sputum, and wheeze.  Her symptoms are worse at night.  She is having trouble sleeping.  She uses her nebulizer 4 to 6 hours, and this helps but doesn't last.  She has felt feverish.  She is still smoking 5 cigarettes per day.   Past Medical History  Diagnosis Date  . Hypertension   . Emphysema   . GERD (gastroesophageal reflux disease)   . Chronic pain   . Tobacco abuse   . Anxiety   . Paroxysmal atrial fibrillation   . Cirrhosis     Past Surgical History  Procedure Date  . Bronchoscopy March 2008, Feb 2012    Allergies  Allergen Reactions  . Penicillins     Physical Exam:  Blood pressure 142/98, pulse 102, temperature 99 F (37.2 C), temperature source Oral, height 5\' 3"  (1.6 m), weight 109 lb 9.6 oz (49.714 kg), SpO2 99.00%. Body mass index is 19.41 kg/(m^2). Wt Readings from Last 2 Encounters:  05/22/11 109 lb 9.6 oz (49.714 kg)  04/07/11 115 lb 6.4 oz (52.345 kg)    General - No distress HEENT - mild sinus tenderness, no oral exudate, TM clear b/l, mild enlargement of Lt cervical nodes Cardiac - s1s2 regular Chest - no wheeze/rales/dullness Abdomen - soft, nontender Extremities - no edema Skin - no rashes Neurologic - normal strength Psychiatric - anxious   Assessment/Plan:  Outpatient Encounter Prescriptions as of 05/22/2011  Medication Sig Dispense Refill  . albuterol  (PROVENTIL) (2.5 MG/3ML) 0.083% nebulizer solution Take 3 mLs (2.5 mg total) by nebulization every 6 (six) hours as needed for wheezing.  375 mL  3  . albuterol (VENTOLIN HFA) 108 (90 BASE) MCG/ACT inhaler Inhale 2 puffs into the lungs every 4 (four) hours as needed for wheezing. Up to four times daily as needed  1 Inhaler  11  . budesonide (PULMICORT) 0.25 MG/2ML nebulizer solution Take 2 mLs (0.25 mg total) by nebulization 2 (two) times daily. DX:  492.8  BRAND MEDICALLY NECESSARY  60 mL  5  . dextromethorphan-guaiFENesin (MUCINEX DM) 30-600 MG per 12 hr tablet Take 1 tablet by mouth every 12 (twelve) hours.        Marland Kitchen HYDROcodone-homatropine (HYCODAN) 5-1.5 MG/5ML syrup Take 5 mLs by mouth every 6 (six) hours as needed. For cough  120 mL  1  . ipratropium (ATROVENT) 0.02 % nebulizer solution Take 2.5 mLs (500 mcg total) by nebulization 4 (four) times daily as needed.  300 mL  5  . Nutritional Supplements (ENSURE PLUS PO) Take by mouth 3 (three) times daily.        Marland Kitchen nystatin (MYCOSTATIN) 100000 UNIT/ML suspension TAKE 1 TEASPOONFUL GARGLE AND SWALLOW 4 TIMES A DAY  120 mL  1  . omeprazole (PRILOSEC) 40 MG capsule TAKE 1 CAPSULE (40 MG TOTAL) BY MOUTH ONCE.  30 capsule  6    Lillyth Spong Pager:  [phone] 05/22/2011, 10:50 AM

## 2011-05-22 NOTE — Assessment & Plan Note (Signed)
She is to continue pulmicort, albuterol, and ipratropium nebulizer.  I don't think she needs prednisone.

## 2011-05-22 NOTE — Patient Instructions (Signed)
Chest xray and lab work today Zithromax 500 mg >> pill one pill daily for 7 days Follow up in 2 weeks

## 2011-05-22 NOTE — Telephone Encounter (Signed)
I spoke with pt and she is requesting her lab and cxr results. i advise dpt VS is not currently in the office and will call once VS results on these. She voiced her understanding. Please advise VS. thanks

## 2011-05-23 NOTE — Telephone Encounter (Signed)
Pt returned call. Cell [phone]. Kimberly Kemp

## 2011-05-23 NOTE — Telephone Encounter (Signed)
LM w/ spouse tcb x1

## 2011-05-23 NOTE — Telephone Encounter (Signed)
Dg Chest 2 View  05/22/2011  *RADIOLOGY REPORT*  Clinical Data: History of emphysema with recurrent infections. Coughing.  Shortness of breath.  Chest pain.  History of smoking and COPD.  CHEST - 2 VIEW  Comparison: 10/26/2009.  03/14/2010.  CT 10/03/2010.  Findings: The cardiac silhouette is normal size and shape. Mediastinal and hilar contours appear stable and normal.  There is a low-lying diaphragm with flattening with overall hyperinflation configuration.  No pulmonary infiltrative densities are identified. There is minimal degenerative spondylosis.  There is slightly osteopenic appearance of bones.  IMPRESSION: Overall hyperinflation configuration consistent with COPD.  No acute or active superimposed abnormality is identified.  Original Report Authenticated By: Crawford Givens, M.D.    CBC    Component Value Date/Time   WBC 8.6 05/22/2011 1134   RBC 4.54 05/22/2011 1134   HGB 14.4 05/22/2011 1134   HCT 42.5 05/22/2011 1134   PLT 278.0 05/22/2011 1134   MCV 93.6 05/22/2011 1134   MCHC 33.9 05/22/2011 1134   RDW 13.5 05/22/2011 1134   LYMPHSABS 1.8 05/22/2011 1134   MONOABS 0.7 05/22/2011 1134   EOSABS 0.1 05/22/2011 1134   BASOSABS 0.1 05/22/2011 1134    CMP     Component Value Date/Time   NA 138 05/22/2011 1134   K 4.0 05/22/2011 1134   CL 108 05/22/2011 1134   CO2 21 05/22/2011 1134   GLUCOSE 105* 05/22/2011 1134   BUN 17 05/22/2011 1134   CREATININE 0.9 05/22/2011 1134   CALCIUM 9.2 05/22/2011 1134   PROT 8.0 05/22/2011 1134   ALBUMIN 4.6 05/22/2011 1134   AST 19 05/22/2011 1134   ALT 15 05/22/2011 1134   ALKPHOS 56 05/22/2011 1134   BILITOT 0.4 05/22/2011 1134   GFRNONAA 83.84 01/14/2010 1052   GFRAA  Value: >60        The eGFR has been calculated using the MDRD equation. This calculation has not been validated in all clinical situations. eGFR's persistently <60 mL/min signify possible Chronic Kidney Disease. 09/19/2008 0230     Please inform her that her lab tests were normal and chest xray  showed expected changes from emphysema.  No new findings.  No change to current treatment plan.

## 2011-05-23 NOTE — Telephone Encounter (Signed)
I spoke with patient about results and she verbalized understanding and had no questions 

## 2011-06-02 ENCOUNTER — Telehealth: Payer: Self-pay | Admitting: Pulmonary Disease

## 2011-06-02 DIAGNOSIS — J438 Other emphysema: Secondary | ICD-10-CM

## 2011-06-02 MED ORDER — HYDROCODONE-HOMATROPINE 5-1.5 MG/5ML PO SYRP: 5.0000 mL | ORAL_SOLUTION | Freq: Four times a day (QID) | ORAL | Status: DC | PRN

## 2011-06-02 NOTE — Telephone Encounter (Signed)
Lm w/ spouse for pt tcb x1 

## 2011-06-02 NOTE — Telephone Encounter (Signed)
Pt returning msg can be reached at [phone].Kimberly Kemp

## 2011-06-02 NOTE — Telephone Encounter (Signed)
Okay to refill hycodan 5 ml q6h prn, dispense 120 ml with 1 refill.

## 2011-06-02 NOTE — Telephone Encounter (Signed)
rx has been called into the pharmacy and pt is aware. Nothing further was needed

## 2011-06-02 NOTE — Telephone Encounter (Signed)
I spoke with pt and she c/o very productive cough w/ yellow-green phlem, some increase SOB, wheezing x couple days. She stated yesterday she had coughed up some stringy blood but only once and had no happened since. Denies any f/c/v/n/s. Pt has been doing her neb tx's as directed. She is requesting a refill on her hycodan cough syrup. Pt stated she was not able to come in for an OV. Please advise Dr. Craige Cotta, thanks  Allergies  Allergen Reactions  . Penicillins

## 2011-06-13 ENCOUNTER — Ambulatory Visit: Payer: Medicaid Other | Admitting: Pulmonary Disease

## 2011-06-18 ENCOUNTER — Encounter: Payer: Self-pay | Admitting: Pulmonary Disease

## 2011-06-18 ENCOUNTER — Ambulatory Visit (INDEPENDENT_AMBULATORY_CARE_PROVIDER_SITE_OTHER): Payer: Medicaid Other | Admitting: Pulmonary Disease

## 2011-06-18 VITALS — BP 126/72 | HR 84 | Temp 98.4°F | Ht 63.0 in | Wt 114.2 lb

## 2011-06-18 DIAGNOSIS — J019 Acute sinusitis, unspecified: Secondary | ICD-10-CM

## 2011-06-18 DIAGNOSIS — M6281 Muscle weakness (generalized): Secondary | ICD-10-CM

## 2011-06-18 DIAGNOSIS — F17200 Nicotine dependence, unspecified, uncomplicated: Secondary | ICD-10-CM

## 2011-06-18 DIAGNOSIS — J438 Other emphysema: Secondary | ICD-10-CM

## 2011-06-18 MED ORDER — BUPROPION HCL ER (SR) 150 MG PO TB12: ORAL_TABLET | ORAL | Status: DC

## 2011-06-18 NOTE — Assessment & Plan Note (Signed)
Improved after recent Abx therapy.  She is to continue her current nebulizer regimen.  Hopefully, if she can stop smoking, then she may be able to decrease her nebulizer use.

## 2011-06-18 NOTE — Progress Notes (Signed)
Chief Complaint  Patient presents with  . Follow-up    Breathing is doing some better, coughing w/ yellow-green, dark [place] phlem, occasional wheezing and chest tx. Still having pain in her lungs    History of Present Illness: Kimberly Kemp is a 46 y.o. female with Emphysema, rhinitis, and continued tobacco abuse.  Her breathing and sinus are doing better.  She still has occasional cough with clear to yellow sputum.  She also still has some soreness in her chest.  She is not wheezing, denies fever.  She continues to smoke, but is convinced she is ready to quit.  She does have some soreness in her left neck also.  She denies numbness in her arm or hand.    Past Medical History  Diagnosis Date  . Hypertension   . Emphysema   . GERD (gastroesophageal reflux disease)   . Chronic pain   . Tobacco abuse   . Anxiety   . Paroxysmal atrial fibrillation   . Cirrhosis     Past Surgical History  Procedure Date  . Bronchoscopy March 2008, Feb 2012    Allergies  Allergen Reactions  . Penicillins     Physical Exam:  Blood pressure 126/72, pulse 84, temperature 98.4 F (36.9 C), temperature source Oral, height 5\' 3"  (1.6 m), weight 114 lb 3.2 oz (51.801 kg), SpO2 100.00%. Body mass index is 20.23 kg/(m^2). Wt Readings from Last 2 Encounters:  06/18/11 114 lb 3.2 oz (51.801 kg)  05/22/11 109 lb 9.6 oz (49.714 kg)    General - No distress HEENT - no sinus tenderness, no oral exudate, TM clear b/l, no LAN Cardiac - s1s2 regular Chest - no wheeze/rales/dullness Abdomen - soft, nontender Extremities - no edema Skin - no rashes Neurologic - normal strength Psychiatric - normal mood, behavior Musculoskeletal - left trapezius tender on palpation     05/22/2011  *RADIOLOGY REPORT*   Clinical Data: History of emphysema with recurrent infections. Coughing.  Shortness of breath.  Chest pain.  History of smoking and COPD.   CHEST - 2 VIEW   Comparison: 10/26/2009.  03/14/2010.  CT  10/03/2010.   Findings: The cardiac silhouette is normal size and shape. Mediastinal and hilar contours appear stable and normal.  There is a low-lying diaphragm with flattening with overall hyperinflation configuration.  No pulmonary infiltrative densities are identified. There is minimal degenerative spondylosis.  There is slightly osteopenic appearance of bones.   IMPRESSION: Overall hyperinflation configuration consistent with COPD.  No acute or active superimposed abnormality is identified.   Original Report Authenticated By: Crawford Givens, M.D.    CBC    Component Value Date/Time   WBC 8.6 05/22/2011 1134   RBC 4.54 05/22/2011 1134   HGB 14.4 05/22/2011 1134   HCT 42.5 05/22/2011 1134   PLT 278.0 05/22/2011 1134   MCV 93.6 05/22/2011 1134   MCHC 33.9 05/22/2011 1134   RDW 13.5 05/22/2011 1134   LYMPHSABS 1.8 05/22/2011 1134   MONOABS 0.7 05/22/2011 1134   EOSABS 0.1 05/22/2011 1134   BASOSABS 0.1 05/22/2011 1134    BMET    Component Value Date/Time   NA 138 05/22/2011 1134   K 4.0 05/22/2011 1134   CL 108 05/22/2011 1134   CO2 21 05/22/2011 1134   GLUCOSE 105* 05/22/2011 1134   BUN 17 05/22/2011 1134   CREATININE 0.9 05/22/2011 1134   CALCIUM 9.2 05/22/2011 1134   GFRNONAA 83.84 01/14/2010 1052   GFRAA  Value: >60  The eGFR has been calculated using the MDRD equation. This calculation has not been validated in all clinical situations. eGFR's persistently <60 mL/min signify possible Chronic Kidney Disease. 09/19/2008 0230    Lab Results  Component Value Date   ALT 15 05/22/2011   AST 19 05/22/2011   ALKPHOS 56 05/22/2011   BILITOT 0.4 05/22/2011    Assessment/Plan:  Outpatient Encounter Prescriptions as of 06/18/2011  Medication Sig Dispense Refill  . albuterol (PROVENTIL) (2.5 MG/3ML) 0.083% nebulizer solution Take 3 mLs (2.5 mg total) by nebulization every 6 (six) hours as needed for wheezing.  375 mL  3  . albuterol (VENTOLIN HFA) 108 (90 BASE) MCG/ACT inhaler Inhale 2 puffs into  the lungs every 4 (four) hours as needed for wheezing. Up to four times daily as needed  1 Inhaler  11  . budesonide (PULMICORT) 0.25 MG/2ML nebulizer solution Take 2 mLs (0.25 mg total) by nebulization 2 (two) times daily. DX:  492.8  BRAND MEDICALLY NECESSARY  60 mL  5  . dextromethorphan-guaiFENesin (MUCINEX DM) 30-600 MG per 12 hr tablet Take 1 tablet by mouth every 12 (twelve) hours.        Marland Kitchen HYDROcodone-homatropine (HYCODAN) 5-1.5 MG/5ML syrup Take 5 mLs by mouth every 6 (six) hours as needed. For cough  120 mL  1  . ipratropium (ATROVENT) 0.02 % nebulizer solution Take 2.5 mLs (500 mcg total) by nebulization 4 (four) times daily as needed.  300 mL  5  . Nutritional Supplements (ENSURE PLUS PO) Take by mouth 3 (three) times daily.        Marland Kitchen nystatin (MYCOSTATIN) 100000 UNIT/ML suspension TAKE 1 TEASPOONFUL GARGLE AND SWALLOW 4 TIMES A DAY  120 mL  1  . omeprazole (PRILOSEC) 40 MG capsule TAKE 1 CAPSULE (40 MG TOTAL) BY MOUTH ONCE.  30 capsule  6    Saba Neuman Pager:  [phone] 06/18/2011, 12:08 PM

## 2011-06-18 NOTE — Assessment & Plan Note (Signed)
Resolved

## 2011-06-18 NOTE — Assessment & Plan Note (Signed)
She is very interested in smoking cessation.  Will try buproprion.  Will also arrange for smoking cessation classes.

## 2011-06-18 NOTE — Patient Instructions (Signed)
Bupropion 150 mg pill>>take one pill daily for 3 days, then one pill twice per day Stop smoking after using Bupropion for one week Will arrange for referral to smoking cessation classes Follow up in 6 weeks

## 2011-06-18 NOTE — Assessment & Plan Note (Signed)
She has muscle strain in Left trapezius.  Advised to use heating pad, and prn NSAIDs for few days.  She should d/w her PCP if this persists.

## 2011-06-27 ENCOUNTER — Other Ambulatory Visit (HOSPITAL_COMMUNITY): Payer: Self-pay | Admitting: Internal Medicine

## 2011-06-27 DIAGNOSIS — Z139 Encounter for screening, unspecified: Secondary | ICD-10-CM

## 2011-07-04 ENCOUNTER — Ambulatory Visit (HOSPITAL_COMMUNITY)
Admission: RE | Admit: 2011-07-04 | Discharge: 2011-07-04 | Disposition: A | Discharge status: Home / Self Care | Payer: Medicaid Other | Source: Ambulatory Visit | Attending: Internal Medicine | Admitting: Internal Medicine

## 2011-07-04 DIAGNOSIS — Z139 Encounter for screening, unspecified: Secondary | ICD-10-CM

## 2011-07-04 DIAGNOSIS — Z1231 Encounter for screening mammogram for malignant neoplasm of breast: Secondary | ICD-10-CM | POA: Insufficient documentation

## 2011-07-09 ENCOUNTER — Other Ambulatory Visit: Payer: Self-pay | Admitting: *Deleted

## 2011-07-09 MED ORDER — ALBUTEROL SULFATE (2.5 MG/3ML) 0.083% IN NEBU: 2.5000 mg | INHALATION_SOLUTION | Freq: Four times a day (QID) | RESPIRATORY_TRACT | Status: DC | PRN

## 2011-07-13 ENCOUNTER — Other Ambulatory Visit: Payer: Self-pay | Admitting: Pulmonary Disease

## 2011-08-03 ENCOUNTER — Other Ambulatory Visit: Payer: Self-pay | Admitting: Pulmonary Disease

## 2011-08-04 ENCOUNTER — Encounter: Payer: Self-pay | Admitting: Pulmonary Disease

## 2011-08-04 ENCOUNTER — Ambulatory Visit (INDEPENDENT_AMBULATORY_CARE_PROVIDER_SITE_OTHER): Payer: Medicaid Other | Admitting: Pulmonary Disease

## 2011-08-04 VITALS — BP 142/98 | HR 90 | Temp 98.3°F | Ht 63.0 in | Wt 118.6 lb

## 2011-08-04 DIAGNOSIS — J438 Other emphysema: Secondary | ICD-10-CM

## 2011-08-04 DIAGNOSIS — F17200 Nicotine dependence, unspecified, uncomplicated: Secondary | ICD-10-CM

## 2011-08-04 MED ORDER — BUDESONIDE 0.25 MG/2ML IN SUSP: 0.2500 mg | Freq: Two times a day (BID) | RESPIRATORY_TRACT | Status: DC

## 2011-08-04 MED ORDER — HYDROCODONE-HOMATROPINE 5-1.5 MG/5ML PO SYRP: 5.0000 mL | ORAL_SOLUTION | Freq: Four times a day (QID) | ORAL | Status: DC | PRN

## 2011-08-04 NOTE — Progress Notes (Signed)
Chief Complaint  Patient presents with  . Follow-up    Pt reports constant "sharp pain in lungs," chest tightness and lightheaded at times even without mod activity, still has dry cough . reports she has stopped smoking, request refill on pulmicort and hycodan     History of Present Illness: Kimberly Kemp is a 46 y.o. female with Emphysema, rhinitis, and continued tobacco abuse.  She quit smoking June 1!  She still gets cough with sputum.  She usually swallows the phlegm. She gets pains in her chest from cough.  She is not wheezing.  Her sinuses are better, and she is able to smell more and taste more.  She gets funny taste in her mouth when she coughs up sputum.  She denies epistaxis or hemoptysis.  She is schedule to see Dr. Andrey Campanile in [place] with ENT to assess neck pain.  She had an episode of vertigo several weeks ago that resolved after she went to sleep.  This has not recurred.  Past Medical History  Diagnosis Date  . Hypertension   . Emphysema   . GERD (gastroesophageal reflux disease)   . Chronic pain   . Tobacco abuse   . Anxiety   . Paroxysmal atrial fibrillation   . Cirrhosis     Past Surgical History  Procedure Date  . Bronchoscopy March 2008, Feb 2012    Allergies  Allergen Reactions  . Penicillins     Physical Exam:  Blood pressure 142/98, pulse 90, temperature 98.3 F (36.8 C), temperature source Oral, height 5\' 3"  (1.6 m), weight 118 lb 9.6 oz (53.797 kg), SpO2 99.00%. Body mass index is 21.01 kg/(m^2). Wt Readings from Last 2 Encounters:  08/04/11 118 lb 9.6 oz (53.797 kg)  06/18/11 114 lb 3.2 oz (51.801 kg)    General - No distress HEENT - no sinus tenderness, no oral exudate, no LAN Cardiac - s1s2 regular Chest - no wheeze/rales/dullness Abdomen - soft, nontender Extremities - no edema Skin - no rashes Neurologic - normal strength Psychiatric - normal mood, behavior   Assessment/Plan:  Outpatient Encounter Prescriptions as of 08/04/2011    Medication Sig Dispense Refill  . albuterol (PROVENTIL) (2.5 MG/3ML) 0.083% nebulizer solution Take 3 mLs (2.5 mg total) by nebulization every 6 (six) hours as needed for wheezing.  360 mL  5  . albuterol (VENTOLIN HFA) 108 (90 BASE) MCG/ACT inhaler Inhale 2 puffs into the lungs every 4 (four) hours as needed for wheezing. Up to four times daily as needed  1 Inhaler  11  . budesonide (PULMICORT) 0.25 MG/2ML nebulizer solution Take 2 mLs (0.25 mg total) by nebulization 2 (two) times daily. DX:  492.8  BRAND MEDICALLY NECESSARY  60 mL  5  . buPROPion (WELLBUTRIN SR) 150 MG 12 hr tablet 150 mg 2 (two) times daily.      Marland Kitchen dextromethorphan-guaiFENesin (MUCINEX DM) 30-600 MG per 12 hr tablet Take 1 tablet by mouth every 12 (twelve) hours.        Marland Kitchen HYDROcodone-homatropine (HYCODAN) 5-1.5 MG/5ML syrup Take 5 mLs by mouth every 6 (six) hours as needed. For cough  120 mL  2  . ipratropium (ATROVENT) 0.02 % nebulizer solution Take 2.5 mLs (500 mcg total) by nebulization 4 (four) times daily as needed.  300 mL  5  . Nutritional Supplements (ENSURE PLUS PO) Take by mouth 3 (three) times daily.        Marland Kitchen nystatin (MYCOSTATIN) 100000 UNIT/ML suspension TAKE 1 TEASPOONFUL GARGLE AND SWALLOW 4 TIMES A  DAY  120 mL  1  . DISCONTD: budesonide (PULMICORT) 0.25 MG/2ML nebulizer solution Take 2 mLs (0.25 mg total) by nebulization 2 (two) times daily. DX:  492.8  BRAND MEDICALLY NECESSARY  60 mL  5  . DISCONTD: budesonide (PULMICORT) 0.25 MG/2ML nebulizer solution TAKE 2 MLS (0.25 MG TOTAL) BY NEBULIZATION 2 (TWO) TIMES DAILY.  120 mL  1  . DISCONTD: buPROPion (WELLBUTRIN SR) 150 MG 12 hr tablet One pill daily for three days, then one pill twice per day  60 tablet  3  . DISCONTD: HYDROcodone-homatropine (HYCODAN) 5-1.5 MG/5ML syrup Take 5 mLs by mouth every 6 (six) hours as needed. For cough  120 mL  1  . omeprazole (PRILOSEC) 40 MG capsule TAKE 1 CAPSULE (40 MG TOTAL) BY MOUTH ONCE.  30 capsule  6    Danyell Shader Pager:   [phone] 08/04/2011, 3:04 PM

## 2011-08-04 NOTE — Assessment & Plan Note (Signed)
Congratulated her on stopping smoking.  She is to continue bupropion for now.

## 2011-08-04 NOTE — Assessment & Plan Note (Addendum)
She is to continue pulmicort and prn albuterol/ipratropium for now.  Will re-assess inhaler needs as she is further away from smoking.

## 2011-08-04 NOTE — Patient Instructions (Signed)
Follow up in 2 months

## 2011-08-22 ENCOUNTER — Other Ambulatory Visit: Payer: Self-pay | Admitting: Pulmonary Disease

## 2011-08-22 MED ORDER — ALBUTEROL SULFATE (2.5 MG/3ML) 0.083% IN NEBU: 2.5000 mg | INHALATION_SOLUTION | Freq: Four times a day (QID) | RESPIRATORY_TRACT | Status: DC | PRN

## 2011-08-22 NOTE — Telephone Encounter (Signed)
Pharmacy requested dx code for rx albuterol neb meds rx resent

## 2011-08-25 ENCOUNTER — Telehealth: Payer: Self-pay | Admitting: Pulmonary Disease

## 2011-08-25 MED ORDER — IPRATROPIUM BROMIDE 0.02 % IN SOLN: 500.0000 ug | Freq: Four times a day (QID) | RESPIRATORY_TRACT | Status: DC | PRN

## 2011-08-25 NOTE — Telephone Encounter (Signed)
Requesting refill on ipratropium neb. rx has been sent

## 2011-09-04 ENCOUNTER — Other Ambulatory Visit: Payer: Self-pay | Admitting: Pulmonary Disease

## 2011-10-03 ENCOUNTER — Ambulatory Visit: Payer: Medicaid Other | Admitting: Pulmonary Disease

## 2011-10-03 ENCOUNTER — Other Ambulatory Visit: Payer: Self-pay | Admitting: Pulmonary Disease

## 2011-10-03 ENCOUNTER — Telehealth: Payer: Self-pay | Admitting: Pulmonary Disease

## 2011-10-03 MED ORDER — IPRATROPIUM BROMIDE 0.02 % IN SOLN: RESPIRATORY_TRACT | Status: DC

## 2011-10-03 NOTE — Telephone Encounter (Signed)
[facility] with the dx code on the rx for the pt  To be filed under her medicaid.  Nothing further is needed.

## 2011-10-10 ENCOUNTER — Ambulatory Visit: Payer: Medicaid Other | Admitting: Pulmonary Disease

## 2011-10-10 ENCOUNTER — Telehealth: Payer: Self-pay | Admitting: Pulmonary Disease

## 2011-10-10 MED ORDER — IPRATROPIUM BROMIDE 0.02 % IN SOLN: RESPIRATORY_TRACT | Status: DC

## 2011-10-10 NOTE — Telephone Encounter (Signed)
rx with dx code has been sent [facility] the [facility] the [facility] [place].  Called and spoke with pt and she is aware of this. Nothing further is needed.

## 2011-10-27 ENCOUNTER — Telehealth: Payer: Self-pay | Admitting: Pulmonary Disease

## 2011-10-27 NOTE — Telephone Encounter (Signed)
[id number].Jennifer Castillo, CMA  

## 2011-10-28 MED ORDER — BUDESONIDE 0.25 MG/2ML IN SUSP: 0.2500 mg | Freq: Two times a day (BID) | RESPIRATORY_TRACT | Status: DC

## 2011-10-28 NOTE — Telephone Encounter (Signed)
Spoke with pt.  She is requesting pulmicort be refilled # 120 ml rather than 60 since the 60 ml only lasts 15 days. Rx was sent to pharm. Nothing further needed per pt.

## 2011-11-03 ENCOUNTER — Other Ambulatory Visit: Payer: Self-pay | Admitting: Pulmonary Disease

## 2011-11-04 ENCOUNTER — Telehealth: Payer: Self-pay | Admitting: Pulmonary Disease

## 2011-11-04 DIAGNOSIS — J438 Other emphysema: Secondary | ICD-10-CM

## 2011-11-04 MED ORDER — HYDROCODONE-HOMATROPINE 5-1.5 MG/5ML PO SYRP: 5.0000 mL | ORAL_SOLUTION | Freq: Four times a day (QID) | ORAL | Status: DC | PRN

## 2011-11-04 NOTE — Telephone Encounter (Signed)
Last rx for Hycodan written on 08-04-11 #122ml with 2 refills. Please advise if ok to refill. Carron Curie, CMA

## 2011-11-04 NOTE — Telephone Encounter (Signed)
Refill sent. Pt is aware. Jennifer Castillo, CMA  

## 2011-11-04 NOTE — Telephone Encounter (Signed)
Okay to refill? 

## 2011-11-05 ENCOUNTER — Encounter: Payer: Self-pay | Admitting: Pulmonary Disease

## 2011-11-05 ENCOUNTER — Ambulatory Visit (INDEPENDENT_AMBULATORY_CARE_PROVIDER_SITE_OTHER): Payer: Medicaid Other | Admitting: Pulmonary Disease

## 2011-11-05 VITALS — BP 140/82 | HR 73 | Temp 98.0°F | Ht 63.0 in | Wt 132.4 lb

## 2011-11-05 DIAGNOSIS — J438 Other emphysema: Secondary | ICD-10-CM

## 2011-11-05 DIAGNOSIS — Z23 Encounter for immunization: Secondary | ICD-10-CM

## 2011-11-05 NOTE — Progress Notes (Signed)
Chief Complaint  Patient presents with  . Follow-up    breathing is worse. c/o wheezing in the AM and evenings, dry cough a lot more, occasionally will chest tx. Sharp pain in her lungs-usually at night    History of Present Illness: Kimberly Kemp is a 46 y.o. female former smoker with chronic cough, emphysema, rhinitis, and GERD.  She has not smoked since last summer.  She uses albuterol/ipratropium 4 times per day, and this helps.  She ran out of pulmicort for several days and her breathing was much worse.  Her sinus are doing okay.  She has reflux, but is taking prilosec.  She gets a cough more at night.  This is usually dry.  She denies fever, or sweats.   She feels she is doing much better compared to where she was over the past spring.  Tests: Echo 04/15/06>>EF 55 to 65% Spirometry 10/26/09>>FEV1 2.59(88%), FEV1% 73 CT chest 06/26/10>>upper lobe emphysema Home sleep study 09/13/10>>AHI 1, SpO2 low 91% CXR 05/22/11>>hyperinflation   Past Medical History  Diagnosis Date  . Hypertension   . Emphysema   . GERD (gastroesophageal reflux disease)   . Chronic pain   . Tobacco abuse   . Anxiety   . Paroxysmal atrial fibrillation   . Cirrhosis     Past Surgical History  Procedure Date  . Bronchoscopy March 2008, Feb 2012    Allergies  Allergen Reactions  . Penicillins     Physical Exam:  Blood pressure 140/82, pulse 73, temperature 98 F (36.7 C), temperature source Oral, height 5\' 3"  (1.6 m), weight 132 lb 6.4 oz (60.056 kg), SpO2 100.00%.  Body mass index is 23.45 kg/(m^2). Wt Readings from Last 2 Encounters:  11/05/11 132 lb 6.4 oz (60.056 kg)  08/04/11 118 lb 9.6 oz (53.797 kg)    General - No distress HEENT - no sinus tenderness, no oral exudate, no LAN Cardiac - s1s2 regular Chest - no wheeze/rales/dullness Abdomen - soft, nontender Extremities - no edema Skin - no rashes Neurologic - normal strength Psychiatric - normal mood,  behavior   Assessment/Plan:  Outpatient Encounter Prescriptions as of 11/05/2011  Medication Sig Dispense Refill  . albuterol (PROVENTIL) (2.5 MG/3ML) 0.083% nebulizer solution Take 3 mLs (2.5 mg total) by nebulization every 6 (six) hours as needed for wheezing.  360 mL  5  . albuterol (VENTOLIN HFA) 108 (90 BASE) MCG/ACT inhaler Inhale 2 puffs into the lungs every 4 (four) hours as needed for wheezing. Up to four times daily as needed  1 Inhaler  11  . budesonide (PULMICORT) 0.25 MG/2ML nebulizer solution Take 2 mLs (0.25 mg total) by nebulization 2 (two) times daily. DX:  492.8  BRAND MEDICALLY NECESSARY  120 mL  5  . buPROPion (WELLBUTRIN SR) 150 MG 12 hr tablet 150 mg 2 (two) times daily.      Marland Kitchen dextromethorphan-guaiFENesin (MUCINEX DM) 30-600 MG per 12 hr tablet Take 1 tablet by mouth every 12 (twelve) hours.        Marland Kitchen HYDROcodone-homatropine (HYCODAN) 5-1.5 MG/5ML syrup Take 5 mLs by mouth every 6 (six) hours as needed. For cough  120 mL  2  . ipratropium (ATROVENT) 0.02 % nebulizer solution Use one vial four times daily as needed  300 mL  5  . Nutritional Supplements (ENSURE PLUS PO) Take by mouth 3 (three) times daily.        Marland Kitchen nystatin (MYCOSTATIN) 100000 UNIT/ML suspension TAKE 1 TEASPOONFUL GARGLE AND SWALLOW 4 TIMES A DAY  120  mL  1  . omeprazole (PRILOSEC) 40 MG capsule TAKE 1 CAPSULE (40 MG TOTAL) BY MOUTH ONCE.  30 capsule  6  . DISCONTD: ipratropium (ATROVENT) 0.02 % nebulizer solution Use 1 vial in nebulizer 4 times daily as needed  300 mL  4    [name] Pager:  [phone] 11/05/2011, 11:54 AM

## 2011-11-05 NOTE — Assessment & Plan Note (Signed)
Stable on her current regimen.  She got her flu shot today.

## 2011-11-05 NOTE — Patient Instructions (Signed)
Flu shot today Follow up in 6 months 

## 2011-12-04 ENCOUNTER — Other Ambulatory Visit: Payer: Self-pay | Admitting: Pulmonary Disease

## 2012-02-01 ENCOUNTER — Other Ambulatory Visit: Payer: Self-pay | Admitting: Pulmonary Disease

## 2012-02-02 NOTE — Telephone Encounter (Signed)
Please advise if okay to refill. Thanks  Last refilled 11/04/11 #120 ML x 2 refills.

## 2012-04-01 ENCOUNTER — Other Ambulatory Visit: Payer: Self-pay | Admitting: Pulmonary Disease

## 2012-05-03 ENCOUNTER — Other Ambulatory Visit: Payer: Self-pay | Admitting: Pulmonary Disease

## 2012-05-03 NOTE — Telephone Encounter (Signed)
Please advise if okay to refill the hycodan? thanks

## 2012-05-06 ENCOUNTER — Other Ambulatory Visit: Payer: Self-pay | Admitting: *Deleted

## 2012-05-06 MED ORDER — ALBUTEROL SULFATE (2.5 MG/3ML) 0.083% IN NEBU: 2.5000 mg | INHALATION_SOLUTION | Freq: Four times a day (QID) | RESPIRATORY_TRACT | Status: DC | PRN

## 2012-05-11 ENCOUNTER — Telehealth: Payer: Self-pay | Admitting: Pulmonary Disease

## 2012-05-11 NOTE — Telephone Encounter (Signed)
Did not receive any fax on pt. I called and made shana aware of this. She will refax this to [facility]front fax. Nothing further was needed

## 2012-05-25 ENCOUNTER — Ambulatory Visit: Payer: Medicare Other | Admitting: Pulmonary Disease

## 2012-05-28 ENCOUNTER — Telehealth: Payer: Self-pay | Admitting: Pulmonary Disease

## 2012-05-28 MED ORDER — IPRATROPIUM BROMIDE 0.02 % IN SOLN: RESPIRATORY_TRACT | Status: DC

## 2012-05-28 NOTE — Telephone Encounter (Signed)
Atrovent [facility] with [facility]. [facility] with [facility] aware and voiced no further questions or concerns at this time.

## 2012-06-02 ENCOUNTER — Other Ambulatory Visit: Payer: Self-pay | Admitting: Pulmonary Disease

## 2012-06-04 NOTE — Telephone Encounter (Signed)
Dr. Craige Cotta please advise if okay to refill and I will call this into the pharmacy thanks

## 2012-06-17 ENCOUNTER — Telehealth: Payer: Self-pay | Admitting: Pulmonary Disease

## 2012-06-17 NOTE — Telephone Encounter (Signed)
Dr Craige Cotta have you seen any medication review form on this pt? Please advise, and if not I can call and have [facility] refax this Thanks!

## 2012-06-18 ENCOUNTER — Ambulatory Visit (INDEPENDENT_AMBULATORY_CARE_PROVIDER_SITE_OTHER)
Admission: RE | Admit: 2012-06-18 | Discharge: 2012-06-18 | Disposition: A | Discharge status: Home / Self Care | Payer: Medicare Other | Source: Ambulatory Visit | Attending: Pulmonary Disease | Admitting: Pulmonary Disease

## 2012-06-18 ENCOUNTER — Encounter: Payer: Self-pay | Admitting: Pulmonary Disease

## 2012-06-18 ENCOUNTER — Encounter: Payer: Self-pay | Admitting: Internal Medicine

## 2012-06-18 ENCOUNTER — Telehealth: Payer: Self-pay | Admitting: Pulmonary Disease

## 2012-06-18 ENCOUNTER — Ambulatory Visit (INDEPENDENT_AMBULATORY_CARE_PROVIDER_SITE_OTHER): Payer: Medicare Other | Admitting: Pulmonary Disease

## 2012-06-18 VITALS — BP 142/86 | HR 75 | Temp 97.4°F | Ht 63.0 in | Wt 153.8 lb

## 2012-06-18 DIAGNOSIS — J439 Emphysema, unspecified: Secondary | ICD-10-CM

## 2012-06-18 DIAGNOSIS — R05 Cough: Secondary | ICD-10-CM

## 2012-06-18 DIAGNOSIS — J438 Other emphysema: Secondary | ICD-10-CM

## 2012-06-18 DIAGNOSIS — K219 Gastro-esophageal reflux disease without esophagitis: Secondary | ICD-10-CM

## 2012-06-18 DIAGNOSIS — R053 Chronic cough: Secondary | ICD-10-CM | POA: Insufficient documentation

## 2012-06-18 MED ORDER — ARFORMOTEROL TARTRATE 15 MCG/2ML IN NEBU: 15.0000 ug | INHALATION_SOLUTION | Freq: Two times a day (BID) | RESPIRATORY_TRACT | Status: DC

## 2012-06-18 NOTE — Assessment & Plan Note (Signed)
She has persistent symptoms of GERD in spite of being on maintenance omeprazole therapy.  Will arrange for GI evaluation to further assess whether her GERD is contributing to her persistent cough.

## 2012-06-18 NOTE — Telephone Encounter (Signed)
Dg Chest 2 View  06/18/2012   *RADIOLOGY REPORT*  Clinical Data: Cough, shortness of breath, and chest pain. Emphysema.  CHEST - 2 VIEW  Comparison: 05/22/2011  Findings: Heart size and vascularity are normal.  Slight peribronchial thickening on the lateral view consistent with bronchitis.  Lungs are somewhat hyperinflated.  No infiltrates or effusions.  No osseous abnormality.  IMPRESSION: Slight bronchitic changes.   Original Report Authenticated By: Francene Boyers, M.D.    Will have my nurse inform pt that CXR showed expected changes from her known history of chronic bronchitis and emphysema.  No change to current treatment plan.

## 2012-06-18 NOTE — Assessment & Plan Note (Signed)
Her previous PFT's did not show obstruction.  She has changes of emphysema on CXR and CT chest.  Her symptoms are consistent with obstructive lung disease.  Will repeat chest xray and PFT.  Will add brovana.  Continue pulmicort, and prn albuterol/ipratropium.

## 2012-06-18 NOTE — Assessment & Plan Note (Signed)
Related to emphysema, and possible reflux.

## 2012-06-18 NOTE — Progress Notes (Signed)
Chief Complaint  Patient presents with  . 6 month follow up    Breathing has gradually worsened.  Wakes up in the night feeling she is suffocating, coughing a lot - nonprod, wheezing, chest tightness and pain.  No f/c/s.      History of Present Illness: Kyanne Rials is a 47 y.o. female former smoker with chronic cough, emphysema, rhinitis, and GERD.  She continues to have trouble with cough, sputum, and chest tightness.  She gets winded in the heat.  She also has more reflux at night, and this seems to trigger her cough.  She is wheezing some.  She has not smoked in the past year.  She uses albuterol/ipratropium every four hours.  She is using omeprazole daily.  Tests: Echo 04/15/06>>EF 55 to 65% Spirometry 10/26/09>>FEV1 2.59(88%), FEV1% 73 CT chest 06/26/10>>upper lobe emphysema Home sleep study 09/13/10>>AHI 1, SpO2 low 91% CXR 05/22/11>>hyperinflation  She  has a past medical history of Hypertension; Emphysema; GERD (gastroesophageal reflux disease); Chronic pain; Tobacco abuse; Anxiety; Paroxysmal atrial fibrillation; and Cirrhosis.  She  has past surgical history that includes Bronchoscopy (March 2008, Feb 2012).  Current Outpatient Prescriptions on File Prior to Visit  Medication Sig Dispense Refill  . albuterol (PROVENTIL) (2.5 MG/3ML) 0.083% nebulizer solution Take 3 mLs (2.5 mg total) by nebulization every 6 (six) hours as needed for wheezing.  360 mL  5  . albuterol (VENTOLIN HFA) 108 (90 BASE) MCG/ACT inhaler Inhale 2 puffs into the lungs every 4 (four) hours as needed for wheezing. Up to four times daily as needed  1 Inhaler  11  . buPROPion (WELLBUTRIN SR) 150 MG 12 hr tablet 150 mg 2 (two) times daily.      Marland Kitchen dextromethorphan-guaiFENesin (MUCINEX DM) 30-600 MG per 12 hr tablet Take 1 tablet by mouth every 12 (twelve) hours.        Marland Kitchen HYDROcodone-homatropine (HYCODAN) 5-1.5 MG/5ML syrup TAKE 1 TEASPOONFUL EVERY 6 HOURS AS NEEDED FOR COUGH  120 mL  0  . ipratropium (ATROVENT)  0.02 % nebulizer solution USE 1 VIAL IN NEBULIZER 4 TIMES DAILY AS NEEDED  300 mL  4  . Nutritional Supplements (ENSURE PLUS PO) Take by mouth 3 (three) times daily.        Marland Kitchen nystatin (MYCOSTATIN) 100000 UNIT/ML suspension TAKE 1 TEASPOONFUL GARGLE AND SWALLOW 4 TIMES A DAY  120 mL  1  . omeprazole (PRILOSEC) 40 MG capsule TAKE 1 CAPSULE (40 MG TOTAL) BY MOUTH ONCE.  30 capsule  3  . budesonide (PULMICORT) 0.25 MG/2ML nebulizer solution Take 2 mLs (0.25 mg total) by nebulization 2 (two) times daily. DX:  492.8  BRAND MEDICALLY NECESSARY  120 mL  5   No current facility-administered medications on file prior to visit.    Allergies  Allergen Reactions  . Penicillins     Physical Exam:  General - No distress HEENT - no sinus tenderness, no oral exudate, no LAN Cardiac - s1s2 regular Chest - prolonged exhalation, no wheeze Abdomen - soft, nontender Extremities - no edema Skin - no rashes Neurologic - normal strength Psychiatric - normal mood, behavior   Assessment/Plan:  Coralyn Helling, MD [facility] Pager:  [phone]

## 2012-06-18 NOTE — Patient Instructions (Signed)
Chest xray today Will schedule breathing test (PFT) Will arrange for gastroenterology referral Brovana one vial nebulized twice per day Budesonide one vial nebulizer twice per day Albuterol/Ipratropium nebulized as needed for cough, wheeze, or chest congestion Follow up in 6 months

## 2012-06-18 NOTE — Telephone Encounter (Signed)
This was addressed during pt ROV on 06/18/2012.

## 2012-06-21 NOTE — Telephone Encounter (Signed)
I spoke with patient about results and she verbalized understanding and had no questions 

## 2012-06-22 ENCOUNTER — Telehealth: Payer: Self-pay | Admitting: Pulmonary Disease

## 2012-06-22 MED ORDER — ARFORMOTEROL TARTRATE 15 MCG/2ML IN NEBU: 15.0000 ug | INHALATION_SOLUTION | Freq: Two times a day (BID) | RESPIRATORY_TRACT | Status: DC

## 2012-06-22 NOTE — Telephone Encounter (Signed)
[facility] needs dx code on it. i have done so nothing further was needed

## 2012-06-30 ENCOUNTER — Telehealth: Payer: Self-pay | Admitting: Pulmonary Disease

## 2012-06-30 MED ORDER — ALBUTEROL SULFATE HFA 108 (90 BASE) MCG/ACT IN AERS: 2.0000 | INHALATION_SPRAY | RESPIRATORY_TRACT | Status: DC | PRN

## 2012-06-30 NOTE — Telephone Encounter (Signed)
Called and spoke with pt and she is requesting a refill of the cough meds--hycodan.  Pt is aware that this will be sent to [facility] to answer in the morning.  Refill of the proventil has been sent in to the pharmacy.    pts medicare part b will not cover the brovanna so they ran this through medicare part d and they did cover this but the pt part is $112.   And the medicaid will not cover this medication without a PA.  Pharmacy is faxing this over today.    VS please advise if ok to refill the hycodan.  Thanks  Allergies  Allergen Reactions  . Penicillins

## 2012-06-30 NOTE — Telephone Encounter (Signed)
Okay to refill hycodan  

## 2012-07-01 MED ORDER — HYDROCODONE-HOMATROPINE 5-1.5 MG/5ML PO SYRP: 5.0000 mL | ORAL_SOLUTION | Freq: Four times a day (QID) | ORAL | Status: DC | PRN

## 2012-07-01 NOTE — Telephone Encounter (Signed)
Rx for hycodan was refilled  Spoke with the pt and notified that this was done She verbalized understanding and states nothing further needed

## 2012-07-02 ENCOUNTER — Telehealth: Payer: Self-pay | Admitting: Pulmonary Disease

## 2012-07-02 MED ORDER — BUDESONIDE 0.25 MG/2ML IN SUSP: 0.2500 mg | Freq: Two times a day (BID) | RESPIRATORY_TRACT | Status: DC

## 2012-07-02 MED ORDER — ARFORMOTEROL TARTRATE 15 MCG/2ML IN NEBU: 15.0000 ug | INHALATION_SOLUTION | Freq: Two times a day (BID) | RESPIRATORY_TRACT | Status: DC

## 2012-07-02 NOTE — Telephone Encounter (Signed)
LMTCB

## 2012-07-02 NOTE — Telephone Encounter (Signed)
[facility] per [facility] pt's request. Pt is aware. Nothing further was needed.

## 2012-07-05 NOTE — Telephone Encounter (Signed)
Pt returned triage's call.  Holly D Pryor ° °

## 2012-07-05 NOTE — Telephone Encounter (Signed)
ATC, NA and mailboc not set up yet [facility]

## 2012-07-05 NOTE — Telephone Encounter (Signed)
Returning call can be reached at [phone].Kimberly Kemp

## 2012-07-05 NOTE — Telephone Encounter (Signed)
[id number].Jennifer Castillo, CMA  

## 2012-07-05 NOTE — Telephone Encounter (Signed)
She has been using budesonide (pulmicort) for some time.  Rosalyn Gess was nebulized medication added at last ROV.  Please confirm that she is having difficulty paying for pulmicort and not brovana.  If she can not afford pulmicort, then she should continue brovana bid with prn ipratropium/albuterol.  She can then stop pulmicort and monitor her symptoms.  If brovana is the medication she can't afford, then she should continue bid pulmicort with prn ipratropium/albuterol.  She can then stop brovana.

## 2012-07-05 NOTE — Telephone Encounter (Signed)
Pt states it is the brovana that is too expensive so pt advised of recs to stop brovana and take budesonide and duoneb. Carron Curie, CMA

## 2012-07-05 NOTE — Telephone Encounter (Signed)
Spoke with patient, patient prescribed budesonide .25/26ml BID at last OV Patient states this is too expensive and she can not afford this Requesting a cheaper alternative. Dr. Craige Cotta please advise, thank you

## 2012-08-02 ENCOUNTER — Other Ambulatory Visit: Payer: Self-pay | Admitting: Pulmonary Disease

## 2012-08-03 NOTE — Telephone Encounter (Signed)
Last OV 06/21/2012 Last fill 07/01/12 #152mL  Would you be okay with refilling this medication? Thanks.

## 2012-08-03 NOTE — Telephone Encounter (Signed)
Rx has been called in per VS. Nothing further is needed.

## 2012-08-05 ENCOUNTER — Other Ambulatory Visit: Payer: Self-pay | Admitting: Pulmonary Disease

## 2012-08-17 ENCOUNTER — Encounter: Payer: Self-pay | Admitting: Internal Medicine

## 2012-08-17 ENCOUNTER — Ambulatory Visit (INDEPENDENT_AMBULATORY_CARE_PROVIDER_SITE_OTHER): Payer: PRIVATE HEALTH INSURANCE | Admitting: Internal Medicine

## 2012-08-17 VITALS — BP 140/76 | HR 80 | Ht 62.6 in | Wt 151.0 lb

## 2012-08-17 DIAGNOSIS — R195 Other fecal abnormalities: Secondary | ICD-10-CM

## 2012-08-17 DIAGNOSIS — K219 Gastro-esophageal reflux disease without esophagitis: Secondary | ICD-10-CM

## 2012-08-17 MED ORDER — MOVIPREP 100 G PO SOLR: 1.0000 | Freq: Once | ORAL | Status: DC

## 2012-08-17 MED ORDER — OMEPRAZOLE 40 MG PO CPDR: 40.0000 mg | DELAYED_RELEASE_CAPSULE | Freq: Two times a day (BID) | ORAL | Status: DC

## 2012-08-17 NOTE — Progress Notes (Signed)
Kimberly Kemp [birth year] MRN [id number]   History of Present Illness:  This is a 47 year old white female ,ex-smoker, with COPD, and rhinitis followed by Dr.Sood. She has had chronic  gastroesophageal reflux disease for many years only partially controlled on Prilosec 40 mg in the mornings. She describes a burning sensation in her esophagus during the day as well as at night with food running back into her mouth. She has a nocturnal cough. Food gets stuck, especially hamburgers.pasta, lasagna. She denies any dysphagia to liquids. She has not tried any tums, Rolaids or antacids. She has gained about 25 pounds in the last several years. Her last pulmonary function tests in 2011 showed FEV1 of 73%. She has a history of hepatitis B and liver disease, however, her liver function tests are normal. She is here today for evaluation of gastroesophageal reflux.   Past Medical History  Diagnosis Date  . Hypertension   . Emphysema   . GERD (gastroesophageal reflux disease)   . Chronic pain   . Tobacco abuse   . Anxiety   . Paroxysmal atrial fibrillation   . Cirrhosis   . COPD (chronic obstructive pulmonary disease)   . Asthma   . Depression   . Pulmonary embolism   . Hepatitis B   . Stroke    Past Surgical History  Procedure Laterality Date  . Bronchoscopy  March 2008, Feb 2012  . Cesarean section    . Appendectomy      microscopic     reports that she quit smoking about 13 months ago. Her smoking use included Cigarettes. She has a 54 pack-year smoking history. She has never used smokeless tobacco. She reports that she does not drink alcohol or use illicit drugs. family history includes Diabetes in her mother; Emphysema in her father and maternal grandmother; Heart disease in her mother; Liver disease in her father; and Prostate cancer in her father. Allergies  Allergen Reactions  . Penicillins         Review of Systems:Positive for dysphagia to solids but not to liquids. Patient with  chest pain. Decreased level of energy  The remainder of the 10 point ROS is negative except as outlined in H&P   Physical Exam: General appearance  Well developed, in no distress.Raspy voice  Eyes- non icteric. HEENT nontraumatic, normocephalic. Mouth no lesions, tongue papillated, no cheilosis. Neck supple without adenopathy, thyroid not enlarged, no carotid bruits, no JVD. Lungs Clear to auscultation bilaterally.No wheezes or rales  Cor normal S1, normal S2, regular rhythm, no murmur,  quiet precordium. Abdomen: Soft with epigastric tenderness , normoactive bowel sounds. No distention. Both lower quadrants unremarkable. Liver edge at costal margin.  Rectal:Normal rectal sphincter tone. Soft trace Hemoccult-positive stool.  Extremities no pedal edema. Skin no lesions. Neurological alert and oriented x 3. Psychological normal mood and affect.  Assessment and Plan:  Problem #47 47 year old white female with chronic gastroesophageal reflux who stopped smoking one year ago. She has ongoing breakthrough symptoms on Prilosec 40 mg daily. We will increase her Prilosec to 40 mg twice a day. I have instructed her on antireflux measures. We will schedule an upper endoscopy with appropriate biopsies and possibly dilation. Some of her symptoms may be indicative of LPR and she may need evaluation for that.  Problem #2 Trace Hemoccult-positive stool on my exam. The patient reports constipation. There is no family history of colon cancer. She has never had a colonoscopy. We will proceed with a colonoscopy for screening since she has complaints of  change in bowel habits.

## 2012-08-17 NOTE — Patient Instructions (Addendum)
You have been scheduled for an endoscopy and colonoscopy with propofol. Please follow written instructions given to you at your visit today. If you use inhalers (even only as needed), please bring them with you on the day of your procedure. Your physician has requested that you go to www.startemmi.com and enter the access code given to you at your visit today. This web site gives a general overview about your procedure. However, you should still follow specific instructions given to you by our office regarding your preparation for the procedure.  We have sent the following medications to your pharmacy for you to pick up at your convenience: Prilosec 40 mg twice daily  CC: Dr Avon Gully, Dr Craige Cotta

## 2012-09-02 ENCOUNTER — Other Ambulatory Visit: Payer: Self-pay | Admitting: Pulmonary Disease

## 2012-09-03 MED ORDER — HYDROCODONE-HOMATROPINE 5-1.5 MG/5ML PO SYRP: 5.0000 mL | ORAL_SOLUTION | Freq: Four times a day (QID) | ORAL | Status: DC | PRN

## 2012-09-03 NOTE — Addendum Note (Signed)
Addended by: Caryl Ada on: 09/03/2012 08:53 AM   Modules accepted: Orders

## 2012-09-03 NOTE — Telephone Encounter (Signed)
Last OV 06/18/2012 Last fill 08/03/2012  Please advise if this is okay to refill. Thanks.

## 2012-09-20 ENCOUNTER — Encounter: Payer: Self-pay | Admitting: Internal Medicine

## 2012-09-20 ENCOUNTER — Telehealth: Payer: Self-pay | Admitting: Pulmonary Disease

## 2012-09-20 ENCOUNTER — Ambulatory Visit (AMBULATORY_SURGERY_CENTER): Payer: PRIVATE HEALTH INSURANCE | Admitting: Internal Medicine

## 2012-09-20 VITALS — BP 144/84 | HR 65 | Temp 98.2°F | Resp 18 | Ht 62.6 in | Wt 151.0 lb

## 2012-09-20 DIAGNOSIS — R1319 Other dysphagia: Secondary | ICD-10-CM

## 2012-09-20 DIAGNOSIS — K219 Gastro-esophageal reflux disease without esophagitis: Secondary | ICD-10-CM

## 2012-09-20 DIAGNOSIS — R195 Other fecal abnormalities: Secondary | ICD-10-CM

## 2012-09-20 DIAGNOSIS — D126 Benign neoplasm of colon, unspecified: Secondary | ICD-10-CM

## 2012-09-20 DIAGNOSIS — K209 Esophagitis, unspecified: Secondary | ICD-10-CM

## 2012-09-20 MED ORDER — SODIUM CHLORIDE 0.9 % IV SOLN: 500.0000 mL | INTRAVENOUS | Status: DC

## 2012-09-20 MED ORDER — BUDESONIDE 0.25 MG/2ML IN SUSP: 0.2500 mg | Freq: Two times a day (BID) | RESPIRATORY_TRACT | Status: DC

## 2012-09-20 NOTE — Op Note (Signed)
[facility] [address] [place], [address]   COLONOSCOPY PROCEDURE REPORT  PATIENT: Kimberly, Kemp  MR#: [id number] BIRTHDATE: January 13, 1966 , 47  yrs. old GENDER: Female ENDOSCOPIST: Hart Carwin, MD REFERRED XB:[id number] Fanta, M.D. PROCEDURE DATE:  09/20/2012 PROCEDURE:   Colonoscopy with cold biopsy polypectomy First Screening Colonoscopy - Avg.  risk and is 50 yrs.  old or older Yes.  Prior Negative Screening - Now for repeat screening. N/A  History of Adenoma - Now for follow-up colonoscopy & has been > or = to 3 yrs.  N/A  Polyps Removed Today? Yes. ASA CLASS:   Class III INDICATIONS:heme-positive stool. MEDICATIONS: Propofol (Diprivan) and propofol (Diprivan) 200mg  IV  DESCRIPTION OF PROCEDURE:   After the risks benefits and alternatives of the procedure were thoroughly explained, informed consent was obtained.  A digital rectal exam revealed no abnormalities of the rectum.   The LB [id number]  endoscope was introduced through the anus and advanced to the cecum, which was identified by both the appendix and ileocecal valve. No adverse events experienced.   The quality of the prep was adequate, using MoviPrep  The instrument was then slowly withdrawn as the colon was fully examined.      COLON FINDINGS: A polypoid shaped sessile polyp ranging between 3-39mm in size was found in the sigmoid colon.  A polypectomy was performed with cold forceps.  The resection was complete and the polyp tissue was completely retrieved.  Retroflexed views revealed no abnormalities. The time to cecum=5 minutes 55 seconds. Withdrawal time=9 minutes 11 seconds.  The scope was withdrawn and the procedure completed. COMPLICATIONS: There were no complications.  ENDOSCOPIC IMPRESSION: Sessile polyp ranging between 3-49mm in size was found in the sigmoid colon; polypectomy was performed with cold forceps nothing to accounmg for heme poditive stool RECOMMENDATIONS: High  fiber diet   eSigned:  Hart Carwin, MD 09/20/2012 9:10 AM   cc:

## 2012-09-20 NOTE — Progress Notes (Signed)
Called to room to assist during endoscopic procedure.  Patient ID and intended procedure confirmed with present staff. Received instructions for my participation in the procedure from the performing physician.  

## 2012-09-20 NOTE — Telephone Encounter (Signed)
Rx has been sent in. Pt is aware. 

## 2012-09-20 NOTE — Progress Notes (Signed)
A/ox3 pleased with MAC, report to Robbin RN 

## 2012-09-20 NOTE — Patient Instructions (Addendum)
Continue current medications. Biopsies taken today, 1 polyp removed. Dilation diet today. Nothing by mouth until 9:23am then clear liquids for 1 hour,then soft diet rest of today, resume regular diet tomorrow. Handouts given on anti-reflux, dilation diet,high fiber diet, and polyps.   YOU HAD AN ENDOSCOPIC PROCEDURE TODAY AT THE [facility]: Refer to the procedure report that was given to you for any specific questions about what was found during the examination.  If the procedure report does not answer your questions, please call your gastroenterologist to clarify.  If you requested that your care partner not be given the details of your procedure findings, then the procedure report has been included in a sealed envelope for you to review at your convenience later.  YOU SHOULD EXPECT: Some feelings of bloating in the abdomen. Passage of more gas than usual.  Walking can help get rid of the air that was put into your GI tract during the procedure and reduce the bloating. If you had a lower endoscopy (such as a colonoscopy or flexible sigmoidoscopy) you may notice spotting of blood in your stool or on the toilet paper. If you underwent a bowel prep for your procedure, then you may not have a normal bowel movement for a few days.  DIET: dilation diet today,see handout. Drink plenty of fluids but you should avoid alcoholic beverages for 24 hours.  ACTIVITY: Your care partner should take you home directly after the procedure.  You should plan to take it easy, moving slowly for the rest of the day.  You can resume normal activity the day after the procedure however you should NOT DRIVE or use heavy machinery for 24 hours (because of the sedation medicines used during the test).    SYMPTOMS TO REPORT IMMEDIATELY: A gastroenterologist can be reached at any hour.  During normal business hours, 8:30 AM to 5:00 PM Monday through Friday, call [phone].  After hours and on weekends, please call the  GI answering service at [phone] who will take a message and have the physician on call contact you.   Following lower endoscopy (colonoscopy or flexible sigmoidoscopy):  Excessive amounts of blood in the stool  Significant tenderness or worsening of abdominal pains  Swelling of the abdomen that is new, acute  Fever of 100F or higher  Following upper endoscopy (EGD)  Vomiting of blood or coffee ground material  New chest pain or pain under the shoulder blades  Painful or persistently difficult swallowing  New shortness of breath  Fever of 100F or higher  Black, tarry-looking stools  FOLLOW UP: If any biopsies were taken you will be contacted by phone or by letter within the next 1-3 weeks.  Call your gastroenterologist if you have not heard about the biopsies in 3 weeks.  Our staff will call the home number listed on your records the next business day following your procedure to check on you and address any questions or concerns that you may have at that time regarding the information given to you following your procedure. This is a courtesy call and so if there is no answer at the home number and we have not heard from you through the emergency physician on call, we will assume that you have returned to your regular daily activities without incident.  SIGNATURES/CONFIDENTIALITY: You and/or your care partner have signed paperwork which will be entered into your electronic medical record.  These signatures attest to the fact that that the information above on your After  Visit Summary has been reviewed and is understood.  Full responsibility of the confidentiality of this discharge information lies with you and/or your care-partner. 

## 2012-09-20 NOTE — Op Note (Addendum)
[facility] [address] [place], [address]   ENDOSCOPY PROCEDURE REPORT  PATIENT: Kimberly Kemp, Kimberly Kemp  MR#: [id number] BIRTHDATE: [birth year] , 47  yrs. old GENDER: Female ENDOSCOPIST: Hart Carwin, MD REFERRED BY:  Glenice Laine, M.D. PROCEDURE DATE:  09/20/2012 PROCEDURE:  EGD w/ biopsy and Maloney dilation of esophagus ASA CLASS:     Class III INDICATIONS:  Dysphagia.   Heme positive stool. Pt has been on ASA MEDICATIONS: MAC sedation, administered by CRNA and Propofol (Diprivan) 370 mg IV TOPICAL ANESTHETIC: none  DESCRIPTION OF PROCEDURE: After the risks benefits and alternatives of the procedure were thoroughly explained, informed consent was obtained.  The LB [id number] endoscope was introduced through the mouth and advanced to the second portion of the duodenum. Without limitations.  The instrument was slowly withdrawn as the mucosa was fully examined.      esophagus: Esophageal mucosa appeared normal throughout the mid proximal and distal esophagus. There was a nonobstructing fibrous ring in distal esophagus at the GE junction endoscope traversed without resistance. There was no evidence of esophagitis.biopsies were taken from the GE junction to assess fibrous ring There was a 1 cm hiatal hernia Stomach: Gastric mucosa appeared unremarkable throughout the body and the gastric antrum. Pyloric outlet was normal. Retroflexion of the scope revealed normal fundus and cardiaDuodenum: Duodenal bulb and descending duodenum appeared normal[         The scope was then withdrawn from the patient and the procedure completed. Maloney dilators 48 [place] passed without resistance or blood on the dilator  COMPLICATIONS: There were no complications. ENDOSCOPIC IMPRESSION: 1.no evidence of esophageal stricture 2. Nothing to account for heme positive stool 3. Status post biopsies from GE junction 5. Status post passage of 66 [place] Maloney  dilator RECOMMENDATIONS: 1.  Anti-reflux regimen to be follow 2.  Continue current medications 3.  Await pathology results 4. proceed with colonoscopy  6.consider barium esophagram to assess es. motility  REPEAT EXAM: no  eSigned:  Hart Carwin, MD 09/20/2012 9:03 AM Revised: 09/20/2012 9:03 AM  CC:  PATIENT NAME:  Kimberly Kemp, Kimberly Kemp MR#: [id number]

## 2012-09-20 NOTE — Progress Notes (Addendum)
NO EGG OR SOY ALLERGY. EWM NO PROBLEMS WITH PAST SEDATION. EWM-- PT STATES SHE HAS NOT HAD SOLID FOOD SINCE LAST Thursday 09-16-12. SHE STATES HER PAPER WORK SAID TO DO HER 1ST PREP Friday 09-13-12 AND TO DO THE SECOND PREP 1000 AM Sunday 09-19-12. SHE DID NOT TAKE A PREP THIS AM BECAUSE SHE STATES HER PAPER WORK DID NOT INSTRUCT THIS. PT STATES SHE HAS A CLEAR STOOL, LIKE URINE, NO SOLID STOOL, NO FLAKES OF STOOL, STATES IS COMPLETELY CLEAR. ALSO SAID HER PAPER WORK SAID TO NOT EAT SOLIDS AFTER LAST Thursday SO SHE HAS BEEN ON CLEAR LIQUIDS SINCE LAST Thursday.  EWM  IV ATTEMPTS 1, 2 BY S HODGES RN UNSUCCESSFUL TO RIGHT HAND AND RIGHT AC. PT TOLERATED THIS WELL. EWM

## 2012-09-20 NOTE — Progress Notes (Signed)
Patient did not experience any of the following events: a burn prior to discharge; a fall within the facility; wrong site/side/patient/procedure/implant event; or a hospital transfer or hospital admission upon discharge from the facility. (G8907) Patient did not have preoperative order for IV antibiotic SSI prophylaxis. (G8918)  

## 2012-09-21 ENCOUNTER — Telehealth: Payer: Self-pay | Admitting: *Deleted

## 2012-09-21 NOTE — Telephone Encounter (Signed)
  Follow up Call-  Call back number 09/20/2012  Post procedure Call Back phone  # [phone]- IF POSSIBLE  Permission to leave phone message Yes     Patient questions:  Do you have a fever, pain , or abdominal swelling? no Pain Score  0 *  Have you tolerated food without any problems? yes  Have you been able to return to your normal activities? yes  Do you have any questions about your discharge instructions: Diet   no Medications  no Follow up visit  no  Do you have questions or concerns about your Care? no  Actions: * If pain score is 4 or above: No action needed, pain <4.

## 2012-09-23 ENCOUNTER — Encounter: Payer: PRIVATE HEALTH INSURANCE | Admitting: Internal Medicine

## 2012-09-24 ENCOUNTER — Encounter: Payer: Self-pay | Admitting: Internal Medicine

## 2012-09-30 ENCOUNTER — Other Ambulatory Visit: Payer: Self-pay | Admitting: Pulmonary Disease

## 2012-09-30 NOTE — Telephone Encounter (Signed)
Last OV 06/18/2012 No pending OV Last fill was on 09/03/2012 #165mL  Dr. Craige Cotta - please advise if you are okay with refilling this. Thanks.

## 2012-10-01 ENCOUNTER — Telehealth: Payer: Self-pay | Admitting: Internal Medicine

## 2012-10-01 NOTE — Telephone Encounter (Signed)
Spoke with patient and gave her results as per result letter on 09/24/12.

## 2012-11-01 ENCOUNTER — Other Ambulatory Visit: Payer: Self-pay | Admitting: Internal Medicine

## 2012-11-01 ENCOUNTER — Other Ambulatory Visit: Payer: Self-pay | Admitting: Pulmonary Disease

## 2012-11-02 ENCOUNTER — Telehealth: Payer: Self-pay | Admitting: Pulmonary Disease

## 2012-11-02 NOTE — Telephone Encounter (Signed)
Please advise on refill. Thanks. 

## 2012-11-02 NOTE — Telephone Encounter (Signed)
Pt last seen VS on 06/18/12.  Pt is requesting refill of the cough medication---hycodan. Last refill of the hycodan was given by VS on 09/30/12.  VS please advise. thanks

## 2012-11-02 NOTE — Telephone Encounter (Signed)
Okay to refill cough medicine. 

## 2012-11-03 MED ORDER — HYDROCODONE-HOMATROPINE 5-1.5 MG/5ML PO SYRP: ORAL_SOLUTION | ORAL | Status: DC

## 2012-11-03 NOTE — Telephone Encounter (Signed)
[facility]. Pt is aware.

## 2012-11-17 ENCOUNTER — Other Ambulatory Visit: Payer: Self-pay | Admitting: Pulmonary Disease

## 2012-11-22 ENCOUNTER — Other Ambulatory Visit: Payer: Self-pay | Admitting: *Deleted

## 2012-11-22 ENCOUNTER — Telehealth: Payer: Self-pay | Admitting: Pulmonary Disease

## 2012-11-22 MED ORDER — IPRATROPIUM BROMIDE 0.02 % IN SOLN: 250.0000 ug | Freq: Four times a day (QID) | RESPIRATORY_TRACT | Status: DC | PRN

## 2012-11-22 NOTE — Telephone Encounter (Signed)
[facility] with DX code on it.

## 2012-12-02 ENCOUNTER — Telehealth: Payer: Self-pay | Admitting: Pulmonary Disease

## 2012-12-02 MED ORDER — HYDROCODONE-HOMATROPINE 5-1.5 MG/5ML PO SYRP: ORAL_SOLUTION | ORAL | Status: DC

## 2012-12-02 NOTE — Telephone Encounter (Signed)
rx has been printed out and placed in the mail to the pt.  i have called and lm with family to have the pt call back to make her aware of CY recs---she will need to keep her appt with VS on 01/04/13.

## 2012-12-02 NOTE — Telephone Encounter (Addendum)
Dr. Craige Cotta patient: Last OV 06-18-12, last refill was on 11-03-12 #138ml. Pt is asking for a refill. She wants Rx mailed, address verified. She states she still has an occasional cough that she uses this for. Please advise. Carron Curie, CMA Allergies  Allergen Reactions  . Penicillins

## 2012-12-02 NOTE — Telephone Encounter (Signed)
ATC the pt, some one answered but then clicked over. I atcx2  back and line was busy. Carron Curie, CMA

## 2012-12-02 NOTE — Telephone Encounter (Signed)
Ok to refill this time, but must make f/u appointment with either Dr Craige Cotta or TP before any future refill, and before end of year.

## 2012-12-02 NOTE — Telephone Encounter (Signed)
Returning call can be reached at [phone].Kimberly Kemp

## 2012-12-03 NOTE — Telephone Encounter (Signed)
Pt advised and states she will keep the appt. Carron Curie, CMA

## 2013-01-04 ENCOUNTER — Ambulatory Visit: Payer: PRIVATE HEALTH INSURANCE | Admitting: Pulmonary Disease

## 2013-01-05 ENCOUNTER — Ambulatory Visit (INDEPENDENT_AMBULATORY_CARE_PROVIDER_SITE_OTHER): Payer: Medicare Other | Admitting: Pulmonary Disease

## 2013-01-05 ENCOUNTER — Encounter: Payer: Self-pay | Admitting: Pulmonary Disease

## 2013-01-05 VITALS — BP 160/100 | HR 106 | Ht 63.0 in | Wt 151.0 lb

## 2013-01-05 DIAGNOSIS — K219 Gastro-esophageal reflux disease without esophagitis: Secondary | ICD-10-CM

## 2013-01-05 DIAGNOSIS — R05 Cough: Secondary | ICD-10-CM

## 2013-01-05 DIAGNOSIS — J438 Other emphysema: Secondary | ICD-10-CM

## 2013-01-05 DIAGNOSIS — R059 Cough, unspecified: Secondary | ICD-10-CM

## 2013-01-05 DIAGNOSIS — J439 Emphysema, unspecified: Secondary | ICD-10-CM

## 2013-01-05 MED ORDER — HYDROCODONE-HOMATROPINE 5-1.5 MG/5ML PO SYRP: ORAL_SOLUTION | ORAL | Status: DC

## 2013-01-05 MED ORDER — PREDNISONE 10 MG PO TABS: ORAL_TABLET | ORAL | Status: DC

## 2013-01-05 NOTE — Assessment & Plan Note (Signed)
Her previous PFT's did not show obstruction.  She has changes of emphysema on CXR and CT chest.  Her symptoms are consistent with obstructive lung disease.  Will repeat chest xray and PFT.  Continue pulmicort, brovana and prn albuterol/ipratropium.

## 2013-01-05 NOTE — Assessment & Plan Note (Signed)
Related to emphysema, and possible reflux.  She has acute bronchitis.  I don't think she needs antibiotics or a chest xray.  Will give course of prednisone.  Renewed her cough medicine.

## 2013-01-05 NOTE — Patient Instructions (Signed)
Prednisone >> use as directed Call if not feeling better Follow up in 6 months

## 2013-01-05 NOTE — Assessment & Plan Note (Signed)
She was seen by GI, and had EGD.  She is to continue PPI.

## 2013-01-05 NOTE — Progress Notes (Signed)
Chief Complaint  Patient presents with  . Follow-up    Breathing is unchanged. Reports DOE, coughing with no production of mucus.  Would like refill on cough syrup.    History of Present Illness: Graylyn Bunney is a 47 y.o. female former smoker with chronic cough, emphysema, rhinitis, and GERD.  She has more trouble with her breathing.  She has more cough and wheeze.  Her lungs hurt.  She is not bringing up sputum.  She denies fever.  Her sinuses are okay.  She has not smoked in 1.5 years.  She is using her albuterol/ipratropium every 4 to 6 hours >> this helps.  She had trouble with spirometry today (coughing), but values in normal range.  Tests: Echo 04/15/06>>EF 55 to 65% Spirometry 10/26/09>>FEV1 2.59(88%), FEV1% 73 CT chest 06/26/10>>upper lobe emphysema Home sleep study 09/13/10>>AHI 1, SpO2 low 91% CXR 05/22/11>>hyperinflation  She  has a past medical history of Hypertension; Emphysema; GERD (gastroesophageal reflux disease); Chronic pain; Tobacco abuse; Anxiety; Paroxysmal atrial fibrillation; Cirrhosis; COPD (chronic obstructive pulmonary disease); Asthma; Depression; Pulmonary embolism; Hepatitis B; and Stroke.  She  has past surgical history that includes Bronchoscopy (March 2008, Feb 2012); Cesarean section; and Appendectomy.  Current Outpatient Prescriptions on File Prior to Visit  Medication Sig Dispense Refill  . albuterol (PROVENTIL) (2.5 MG/3ML) 0.083% nebulizer solution Take 3 mLs (2.5 mg total) by nebulization every 6 (six) hours as needed for wheezing.  360 mL  5  . albuterol (VENTOLIN HFA) 108 (90 BASE) MCG/ACT inhaler Inhale 2 puffs into the lungs every 4 (four) hours as needed for wheezing. Up to four times daily as needed  1 Inhaler  5  . arformoterol (BROVANA) 15 MCG/2ML NEBU Take 2 mLs (15 mcg total) by nebulization 2 (two) times daily. Dx 492.8  120 mL  6  . budesonide (PULMICORT) 0.25 MG/2ML nebulizer solution Take 2 mL (0.25 mg total) by nebulization 2 (two)  times daily. DX:  492.8  120 mL  6  . buPROPion (WELLBUTRIN SR) 150 MG 12 hr tablet 150 mg 2 (two) times daily.      Marland Kitchen dextromethorphan-guaiFENesin (MUCINEX DM) 30-600 MG per 12 hr tablet Take 1 tablet by mouth every 12 (twelve) hours.        Marland Kitchen HYDROcodone-homatropine (HYCODAN) 5-1.5 MG/5ML syrup TAKE BY MOUTH EVERY 6 HOURS AS NEEDED FOR COUGH  120 mL  0  . ipratropium (ATROVENT) 0.02 % nebulizer solution Take 1.25 mLs (250 mcg total) by nebulization 4 (four) times daily as needed for wheezing. DX 492.8  250 mL  4  . Nutritional Supplements (ENSURE PLUS PO) Take by mouth 3 (three) times daily.        Marland Kitchen nystatin (MYCOSTATIN) 100000 UNIT/ML suspension TAKE 1 TEASPOONFUL GARGLE AND SWALLOW 4 TIMES A DAY  120 mL  1  . omeprazole (PRILOSEC) 40 MG capsule TAKE 1 CAPSULE (40 MG TOTAL) BY MOUTH 2 (TWO) TIMES DAILY.  60 capsule  5   No current facility-administered medications on file prior to visit.    Allergies  Allergen Reactions  . Penicillins     Physical Exam:  General - No distress HEENT - no sinus tenderness, no oral exudate, no LAN Cardiac - s1s2 regular Chest - prolonged exhalation, no wheeze Abdomen - soft, nontender Extremities - no edema Skin - no rashes Neurologic - normal strength Psychiatric - normal mood, behavior   Assessment/Plan:  Coralyn Helling, MD [facility] Pager:  [phone]

## 2013-02-10 ENCOUNTER — Telehealth: Payer: Self-pay | Admitting: Pulmonary Disease

## 2013-02-10 DIAGNOSIS — J438 Other emphysema: Secondary | ICD-10-CM

## 2013-02-10 NOTE — Telephone Encounter (Signed)
Called home # listed and received message have reached a # that has been d/c or no longer in service Called mobile # listed and LMTCB x1

## 2013-02-11 NOTE — Telephone Encounter (Signed)
LMTCB on mobile #. Home number is disconnected.[place] Bing, CMA

## 2013-02-14 NOTE — Telephone Encounter (Signed)
LMTCBx1 on mobile#, home number disconnected.[place] Bing, CMA

## 2013-02-14 NOTE — Telephone Encounter (Signed)
Last ov w/ VS 12.3.14 Rx Hydromet given at ov #241mL  Called spoke with patient who reported that her dry cough has worsened since last ov; is worse at night and becomes "really sore" with all the coughing.  She is also c/o chest tightness and wheezing.  Pt denies any mucus production, increased SOB, f/c/s, nausea, vomiting, edema.  Pt is requesting a refill her Hydromet.  Pt is aware she will need to come to the office to pick up this rx.  Dr Halford Chessman please advise, thank you.  **Pt also stated that she received a notice stating she is due for new neb supplies but an order must be sent.  Verified that pt uses [facility] for this service.  Order placed for new neb tubing and supplies.  Allergies  Allergen Reactions  . Penicillins

## 2013-02-14 NOTE — Telephone Encounter (Signed)
Pt is returning triage's call & can be reached at [phone].  Kimberly Kemp

## 2013-02-15 MED ORDER — HYDROCODONE-HOMATROPINE 5-1.5 MG/5ML PO SYRP: ORAL_SOLUTION | ORAL | Status: DC

## 2013-02-15 NOTE — Telephone Encounter (Signed)
Rx printed and given to Dr. Halford Chessman to sign. Please advise the pt once Rx is ready for pick-up. Thanks. [place] Bing, CMA

## 2013-02-15 NOTE — Telephone Encounter (Signed)
Pt is aware that rx is ready for pick up.  

## 2013-02-15 NOTE — Telephone Encounter (Signed)
Okay to reorder hycodan.

## 2013-03-12 ENCOUNTER — Telehealth: Payer: Self-pay | Admitting: Pulmonary Disease

## 2013-03-12 NOTE — Telephone Encounter (Signed)
Chest cold, fever 100 Feels weak, dehydrated Zpak called [facility] to [facility], [place] POOF Mucinex, Nebs prn wheezing Call if no better

## 2013-04-03 ENCOUNTER — Other Ambulatory Visit: Payer: Self-pay | Admitting: Pulmonary Disease

## 2013-04-04 ENCOUNTER — Telehealth: Payer: Self-pay | Admitting: Pulmonary Disease

## 2013-04-04 NOTE — Telephone Encounter (Signed)
Pt is needing refill on Hycodan. This was last filled on 02/15/13 #230mL Has pending OV with VS on 04/27/13 per her request. She is aware that VS is not in the office until 04/05/13 PM. Would like this mailed to her once it's ready.  VS - please advise on refill.

## 2013-04-04 NOTE — Telephone Encounter (Signed)
Okay to send refill. 

## 2013-04-06 MED ORDER — HYDROCODONE-HOMATROPINE 5-1.5 MG/5ML PO SYRP: ORAL_SOLUTION | ORAL | Status: DC

## 2013-04-06 NOTE — Telephone Encounter (Signed)
Forgot to get Dr. Halford Chessman to sign the prescription when he was here in the office. CY is okay with filling this for him. This has been placed in the mail per the pt's request. She is aware.

## 2013-04-09 ENCOUNTER — Telehealth: Payer: Self-pay | Admitting: Internal Medicine

## 2013-04-09 MED ORDER — HYDROCODONE-HOMATROPINE 5-1.5 MG/5ML PO SYRP: ORAL_SOLUTION | ORAL | Status: DC

## 2013-04-09 NOTE — Telephone Encounter (Signed)
Says has not yet received hycodan and having worse cough and diffuse chest pain with cough. [facility] Requested rx from [place] to send electronically or print and fax rec wait on mailed rx or go to [facility] if condition worsens

## 2013-04-11 ENCOUNTER — Telehealth: Payer: Self-pay | Admitting: Internal Medicine

## 2013-04-11 MED ORDER — ALBUTEROL SULFATE (2.5 MG/3ML) 0.083% IN NEBU: INHALATION_SOLUTION | RESPIRATORY_TRACT | Status: DC

## 2013-04-11 NOTE — Telephone Encounter (Signed)
I have resent fax over. Nothing further needed

## 2013-04-27 ENCOUNTER — Ambulatory Visit (INDEPENDENT_AMBULATORY_CARE_PROVIDER_SITE_OTHER): Payer: Medicare Other | Admitting: Pulmonary Disease

## 2013-04-27 ENCOUNTER — Encounter: Payer: Self-pay | Admitting: Pulmonary Disease

## 2013-04-27 VITALS — BP 138/88 | HR 91 | Temp 97.6°F | Ht 63.0 in | Wt 148.6 lb

## 2013-04-27 DIAGNOSIS — R053 Chronic cough: Secondary | ICD-10-CM

## 2013-04-27 DIAGNOSIS — R079 Chest pain, unspecified: Secondary | ICD-10-CM | POA: Insufficient documentation

## 2013-04-27 DIAGNOSIS — R05 Cough: Secondary | ICD-10-CM

## 2013-04-27 DIAGNOSIS — R059 Cough, unspecified: Secondary | ICD-10-CM

## 2013-04-27 DIAGNOSIS — J438 Other emphysema: Secondary | ICD-10-CM

## 2013-04-27 MED ORDER — HYDROCODONE-HOMATROPINE 5-1.5 MG/5ML PO SYRP: ORAL_SOLUTION | ORAL | Status: DC

## 2013-04-27 MED ORDER — OXYCODONE-ACETAMINOPHEN 5-325 MG PO TABS: 1.0000 | ORAL_TABLET | Freq: Four times a day (QID) | ORAL | Status: DC | PRN

## 2013-04-27 NOTE — Patient Instructions (Signed)
Follow-up in 6 weeks

## 2013-04-27 NOTE — Assessment & Plan Note (Signed)
Continue brovana, pulmicort, and prn albuterol/ipratropium.

## 2013-04-27 NOTE — Assessment & Plan Note (Signed)
Will refill hycodan.

## 2013-04-27 NOTE — Progress Notes (Signed)
Chief Complaint  Patient presents with  . Follow-up    C/o dizziness and numbness/tingling when first wakes up in the morning. Pain in mid back radiating to anterior chest, sharp pain lasting all day. C/o dry cough x 4 weeks.     History of Present Illness: Kimberly Kemp is a 48 y.o. female former smoker with chronic cough, emphysema, rhinitis, and GERD.  She was on prednisone in December.  She was treated with antibiotics in February for bronchitis.  She feels terrible, burning pain in her lungs.  This has gotten worse over the past few weeks.  She has a dry cough, and wheezing in her chest.  She denies fever.  She denies smoking.  She uses albuterol/ipratropium every 4 hours.  It hurts when she takes a deep breath.  She says prednisone doesn't touch this kind of pain.  She uses hycodan and this helps.  She was given a percocet by her sister, and this did help with her pain.  Tests: Echo 04/15/06>>EF 55 to 65% Spirometry 10/26/09>>FEV1 2.59(88%), FEV1% 73 CT chest 06/26/10>>upper lobe emphysema Home sleep study 09/13/10>>AHI 1, SpO2 low 91% CXR 05/22/11>>hyperinflation  She  has a past medical history of Hypertension; Emphysema; GERD (gastroesophageal reflux disease); Chronic pain; Tobacco abuse; Anxiety; Paroxysmal atrial fibrillation; Cirrhosis; COPD (chronic obstructive pulmonary disease); Asthma; Depression; Pulmonary embolism; Hepatitis B; and Stroke.  She  has past surgical history that includes Bronchoscopy (March 2008, Feb 2012); Cesarean section; and Appendectomy.  Current Outpatient Prescriptions on File Prior to Visit  Medication Sig Dispense Refill  . albuterol (PROVENTIL) (2.5 MG/3ML) 0.083% nebulizer solution USE 3 MLS (2.5 MG TOTAL) BY NEBULIZATION EVERY 6 (SIX) HOURS AS NEEDED FOR WHEEZING.  375 mL  5  . albuterol (VENTOLIN HFA) 108 (90 BASE) MCG/ACT inhaler Inhale 2 puffs into the lungs every 4 (four) hours as needed for wheezing. Up to four times daily as needed  1 Inhaler   5  . arformoterol (BROVANA) 15 MCG/2ML NEBU Take 2 mLs (15 mcg total) by nebulization 2 (two) times daily. Dx 492.8  120 mL  6  . budesonide (PULMICORT) 0.25 MG/2ML nebulizer solution Take 2 mL (0.25 mg total) by nebulization 2 (two) times daily. DX:  492.8  120 mL  6  . buPROPion (WELLBUTRIN SR) 150 MG 12 hr tablet 150 mg 2 (two) times daily.      Marland Kitchen dextromethorphan-guaiFENesin (MUCINEX DM) 30-600 MG per 12 hr tablet Take 1 tablet by mouth every 12 (twelve) hours.        Marland Kitchen HYDROcodone-homatropine (HYCODAN) 5-1.5 MG/5ML syrup TAKE 5ML BY MOUTH EVERY 6 HOURS AS NEEDED FOR COUGH  240 mL  0  . ipratropium (ATROVENT) 0.02 % nebulizer solution Take 1.25 mLs (250 mcg total) by nebulization 4 (four) times daily as needed for wheezing. DX 492.8  250 mL  4  . Nutritional Supplements (ENSURE PLUS PO) Take by mouth 3 (three) times daily.        Marland Kitchen nystatin (MYCOSTATIN) 100000 UNIT/ML suspension TAKE 1 TEASPOONFUL GARGLE AND SWALLOW 4 TIMES A DAY  120 mL  1  . omeprazole (PRILOSEC) 40 MG capsule TAKE 1 CAPSULE (40 MG TOTAL) BY MOUTH 2 (TWO) TIMES DAILY.  60 capsule  5   No current facility-administered medications on file prior to visit.    Allergies  Allergen Reactions  . Penicillins     Physical Exam:  General - No distress HEENT - no sinus tenderness, no oral exudate, no LAN Cardiac - s1s2 regular Chest -  prolonged exhalation, no wheeze Abdomen - soft, nontender Extremities - no edema Skin - no rashes Neurologic - normal strength Psychiatric - normal mood, behavior   Assessment/Plan:  Chesley Mires, MD [place] Pager:  [phone]

## 2013-04-27 NOTE — Assessment & Plan Note (Signed)
Difficulty to determine what is the cause of this.  Her symptoms are non-specific, and no significant physical findings.  Will give her trial of percocet >> I explained that this would only be short term, and if her symptoms persist that she would need to have further evaluation.

## 2013-05-02 ENCOUNTER — Other Ambulatory Visit: Payer: Self-pay | Admitting: Internal Medicine

## 2013-05-16 ENCOUNTER — Telehealth: Payer: Self-pay | Admitting: Pulmonary Disease

## 2013-05-16 NOTE — Telephone Encounter (Signed)
Spoke with pt. appt scheduled. Nothing further needed 

## 2013-05-16 NOTE — Telephone Encounter (Signed)
I am not sure why she continues to have chest pain.  She needs to come in for repeat evaluation prior to getting refill on pain medication.

## 2013-05-16 NOTE — Telephone Encounter (Signed)
Per OV 04/27/13: Chest pain - Kimberly Mires, MD at 04/27/2013  1:51 PM      Status: Written Related Problem: Chest pain    Difficulty to determine what is the cause of this.  Her symptoms are non-specific, and no significant physical findings.  Will give her trial of percocet >> I explained that this would only be short term, and if her symptoms persist that she would need to have further evaluation  --  Pt reports she still having "pain coming from her lungs in her chest". She wants RX for precocet mailed to her. RX was giving to pt 04/27/13/ Please advise VS thanks

## 2013-06-02 ENCOUNTER — Ambulatory Visit (INDEPENDENT_AMBULATORY_CARE_PROVIDER_SITE_OTHER)
Admission: RE | Admit: 2013-06-02 | Discharge: 2013-06-02 | Disposition: A | Discharge status: Home / Self Care | Payer: Medicare Other | Source: Ambulatory Visit | Attending: Pulmonary Disease | Admitting: Pulmonary Disease

## 2013-06-02 ENCOUNTER — Ambulatory Visit (INDEPENDENT_AMBULATORY_CARE_PROVIDER_SITE_OTHER): Payer: Medicare Other | Admitting: Pulmonary Disease

## 2013-06-02 ENCOUNTER — Encounter: Payer: Self-pay | Admitting: Pulmonary Disease

## 2013-06-02 VITALS — BP 162/90 | HR 102 | Ht 63.0 in | Wt 142.8 lb

## 2013-06-02 DIAGNOSIS — J439 Emphysema, unspecified: Secondary | ICD-10-CM

## 2013-06-02 DIAGNOSIS — R059 Cough, unspecified: Secondary | ICD-10-CM

## 2013-06-02 DIAGNOSIS — R053 Chronic cough: Secondary | ICD-10-CM

## 2013-06-02 DIAGNOSIS — J438 Other emphysema: Secondary | ICD-10-CM

## 2013-06-02 DIAGNOSIS — R079 Chest pain, unspecified: Secondary | ICD-10-CM

## 2013-06-02 DIAGNOSIS — R05 Cough: Secondary | ICD-10-CM

## 2013-06-02 MED ORDER — OXYCODONE-ACETAMINOPHEN 5-325 MG PO TABS: 1.0000 | ORAL_TABLET | Freq: Four times a day (QID) | ORAL | Status: DC | PRN

## 2013-06-02 MED ORDER — HYDROCODONE-HOMATROPINE 5-1.5 MG/5ML PO SYRP: ORAL_SOLUTION | ORAL | Status: DC

## 2013-06-02 NOTE — Progress Notes (Signed)
Chief Complaint  Patient presents with  . Follow-up    c/o - still having chet pain - Percocet was helping at first then had to start taking 2 tablets a day ) Pain goes from upper right shoulder to mid back - Diff taking a deep breathe due to pain - SOB with exertion - Prod cough (white) - Requesting refills on Hycodan and Nystatin and ? Percocet    History of Present Illness: Kimberly Kemp is a 48 y.o. female former smoker with chronic cough, emphysema, rhinitis, and GERD.  She continues to have chest pain in her back and lower ribs.  She has cough and wheeze with thick clear to dark [place] sputum.  She denies fever.  She is not smoking, but her husband and son smoke >> they smoke outside.    Oxycodone and hycodan help her chest pain and cough.  She was told by her son that she sometimes gets blue colored lips.  She has not noticed change in her finger color.  Tests: Echo 04/15/06>>EF 55 to 65% Spirometry 10/26/09>>FEV1 2.59(88%), FEV1% 73 CT chest 06/26/10>>upper lobe emphysema Home sleep study 09/13/10>>AHI 1, SpO2 low 91% CXR 05/22/11>>hyperinflation  She  has a past medical history of Hypertension; Emphysema; GERD (gastroesophageal reflux disease); Chronic pain; Tobacco abuse; Anxiety; Paroxysmal atrial fibrillation; Cirrhosis; COPD (chronic obstructive pulmonary disease); Asthma; Depression; Pulmonary embolism; Hepatitis B; and Stroke.  She  has past surgical history that includes Bronchoscopy (March 2008, Feb 2012); Cesarean section; and Appendectomy.  Current Outpatient Prescriptions on File Prior to Visit  Medication Sig Dispense Refill  . albuterol (PROVENTIL) (2.5 MG/3ML) 0.083% nebulizer solution USE 3 MLS (2.5 MG TOTAL) BY NEBULIZATION EVERY 6 (SIX) HOURS AS NEEDED FOR WHEEZING.  375 mL  5  . albuterol (VENTOLIN HFA) 108 (90 BASE) MCG/ACT inhaler Inhale 2 puffs into the lungs every 4 (four) hours as needed for wheezing. Up to four times daily as needed  1 Inhaler  5  .  arformoterol (BROVANA) 15 MCG/2ML NEBU Take 2 mLs (15 mcg total) by nebulization 2 (two) times daily. Dx 492.8  120 mL  6  . budesonide (PULMICORT) 0.25 MG/2ML nebulizer solution Take 2 mL (0.25 mg total) by nebulization 2 (two) times daily. DX:  492.8  120 mL  6  . buPROPion (WELLBUTRIN SR) 150 MG 12 hr tablet 150 mg 2 (two) times daily.      Marland Kitchen dextromethorphan-guaiFENesin (MUCINEX DM) 30-600 MG per 12 hr tablet Take 1 tablet by mouth every 12 (twelve) hours.        Marland Kitchen HYDROcodone-homatropine (HYCODAN) 5-1.5 MG/5ML syrup TAKE 5ML BY MOUTH EVERY 6 HOURS AS NEEDED FOR COUGH  240 mL  0  . ipratropium (ATROVENT) 0.02 % nebulizer solution Take 1.25 mLs (250 mcg total) by nebulization 4 (four) times daily as needed for wheezing. DX 492.8  250 mL  4  . Nutritional Supplements (ENSURE PLUS PO) Take by mouth 3 (three) times daily.        Marland Kitchen nystatin (MYCOSTATIN) 100000 UNIT/ML suspension TAKE 1 TEASPOONFUL GARGLE AND SWALLOW 4 TIMES A DAY  120 mL  1  . omeprazole (PRILOSEC) 40 MG capsule TAKE 1 CAPSULE (40 MG TOTAL) BY MOUTH 2 (TWO) TIMES DAILY.  60 capsule  5  . promethazine (PHENERGAN) 25 MG tablet Take 1 tablet by mouth 2 (two) times daily as needed.      . zolpidem (AMBIEN) 5 MG tablet Take 1 tablet by mouth at bedtime as needed.      Marland Kitchen  oxyCODONE-acetaminophen (ROXICET) 5-325 MG per tablet Take 1 tablet by mouth every 6 (six) hours as needed for severe pain.  30 tablet  0   No current facility-administered medications on file prior to visit.    Allergies  Allergen Reactions  . Penicillins     Physical Exam:  General - No distress HEENT - no sinus tenderness, no oral exudate, no LAN Cardiac - s1s2 regular Chest - prolonged exhalation, no wheeze, mild tenderness on palpation of rib margins posteriorly Abdomen - soft, nontender Extremities - no edema Skin - no rashes Neurologic - normal strength Psychiatric - normal mood, behavior   Assessment/Plan:  Kimberly Mires, MD [place] Pager:  [phone]

## 2013-06-02 NOTE — Assessment & Plan Note (Signed)
Will refill hycodan for now.

## 2013-06-02 NOTE — Assessment & Plan Note (Signed)
Difficulty to determine what is the cause of this.  Her symptoms are non-specific, and may be musculoskeletal.  Will check CXR and then determine if she needs repeat CT chest.  Will refill oxycodone for now.  If pulmonary evaluation is negative, then she will need to have f/u with primary for further assessment of her chest pain.

## 2013-06-02 NOTE — Patient Instructions (Signed)
Chest xray today  Follow up in 6 weeks 

## 2013-06-02 NOTE — Assessment & Plan Note (Signed)
Continue brovana, pulmicort, and prn albuterol/ipratropium. 

## 2013-06-03 ENCOUNTER — Telehealth: Payer: Self-pay | Admitting: Pulmonary Disease

## 2013-06-03 DIAGNOSIS — J438 Other emphysema: Secondary | ICD-10-CM

## 2013-06-03 NOTE — Telephone Encounter (Signed)
Pt requesting CXR results from 06/02/13. Please advise Dr. Halford Chessman. Thanks

## 2013-06-06 NOTE — Telephone Encounter (Signed)
Noted  

## 2013-06-06 NOTE — Telephone Encounter (Signed)
Spoke with pt. Aware of recs. She reports she is still having the pains and worse in left lung. She does want CT ordered. I have done so. Will also forward to VS as an [place]

## 2013-06-06 NOTE — Telephone Encounter (Signed)
Dg Chest 2 View  06/02/2013   CLINICAL DATA:  Chronic shortness of breath, chest pain  EXAM: CHEST  2 VIEW  COMPARISON:  DG CHEST 2 VIEW dated 06/18/2012  FINDINGS: The lungs are hyperinflated likely secondary to COPD. There is no focal parenchymal opacity, pleural effusion, or pneumothorax. The heart and mediastinal contours are unremarkable.  The osseous structures are unremarkable.  IMPRESSION: No active cardiopulmonary disease.   Electronically Signed   By: Kathreen Devoid   On: 06/02/2013 12:24   Please inform pt that CXR was normal.  If her pain symptoms are better, then no additional changes needed.  If she is still having pain, then she will need CT chest with contrast to further assess.

## 2013-06-08 ENCOUNTER — Ambulatory Visit: Payer: Medicare Other | Admitting: Pulmonary Disease

## 2013-06-10 ENCOUNTER — Ambulatory Visit (INDEPENDENT_AMBULATORY_CARE_PROVIDER_SITE_OTHER)
Admission: RE | Admit: 2013-06-10 | Discharge: 2013-06-10 | Disposition: A | Discharge status: Home / Self Care | Payer: Medicare Other | Source: Ambulatory Visit | Attending: Pulmonary Disease | Admitting: Pulmonary Disease

## 2013-06-10 DIAGNOSIS — J438 Other emphysema: Secondary | ICD-10-CM

## 2013-06-10 MED ORDER — IOHEXOL 300 MG/ML  SOLN
80.0000 mL | Freq: Once | INTRAMUSCULAR | Status: AC | PRN
  Administered 2013-06-10: 80 mL via INTRAVENOUS

## 2013-06-14 ENCOUNTER — Telehealth: Payer: Self-pay | Admitting: Pulmonary Disease

## 2013-06-14 DIAGNOSIS — J438 Other emphysema: Secondary | ICD-10-CM

## 2013-06-14 NOTE — Telephone Encounter (Signed)
I called pt. Aware will send order for [facility] to [facility] for neb supplies/filter. Nothing further needed

## 2013-06-16 ENCOUNTER — Telehealth: Payer: Self-pay | Admitting: Pulmonary Disease

## 2013-06-16 NOTE — Telephone Encounter (Signed)
06/10/2013    CLINICAL DATA:  Pleuritic chest pain.   EXAM: CT CHEST WITH CONTRAST   TECHNIQUE: Multidetector CT imaging of the chest was performed during intravenous contrast administration.   CONTRAST:  21mL OMNIPAQUE IOHEXOL 300 MG/ML  SOLN COMPARISON:  10/03/2010 and 01/18/2010   FINDINGS:  Lungs are well inflated with minimal centrilobular emphysematous disease. There is no evidence of airspace consolidation or effusion. The heart is normal in size. Remaining mediastinal structures are unremarkable. There is no mediastinal, hilar or axillary adenopathy.  Images through the upper abdomen demonstrate a nonspecific low-density 1 cm lymph node just below the gastroesophageal junction without significant change from 2011. Possible mild fatty infiltration of the liver. Remainder the exam is unchanged.   IMPRESSION:  Minimal stable emphysematous disease.  No acute findings. Electronically Signed   By: Marin Olp M.D.   On: 06/10/2013 16:03   Will have my nurse inform pt that CXR shows mild changes from emphysema >> no change from CT chest in 2012.  No other abnormal findings.  Nothing to explain her chest pain.  If her chest pain continues, then she would need to d/w her PCP for further assessment.

## 2013-06-16 NOTE — Telephone Encounter (Signed)
Pt is aware of results. 

## 2013-06-30 ENCOUNTER — Other Ambulatory Visit: Payer: Self-pay | Admitting: Pulmonary Disease

## 2013-07-01 ENCOUNTER — Telehealth: Payer: Self-pay | Admitting: Pulmonary Disease

## 2013-07-01 NOTE — Telephone Encounter (Signed)
Pt is aware that VS is not in the office but will send to him to advise if okay to give refills and have another MD in the office sign the Rx's in his behalf. Pt aware that it will be Monday before we know of approval and can mail to patient.

## 2013-07-05 MED ORDER — HYDROCODONE-HOMATROPINE 5-1.5 MG/5ML PO SYRP: ORAL_SOLUTION | ORAL | Status: DC

## 2013-07-05 NOTE — Telephone Encounter (Signed)
Okay to refill hycodan.  Please inform pt that her pulmonary evaluation has been negative for cause of her chest pain.  Will not renew roxicet >> she will need to speak with her PCP about what additional evaluation is needed for her chest pain, and whether she needs additional pain medications.

## 2013-07-05 NOTE — Telephone Encounter (Signed)
Spoke with the pt and notified of recs per VS She verbalized understanding  Rx for hycodan signed by VS and was mailed to her home address which I verified with her  Nothing further needed

## 2013-07-15 ENCOUNTER — Encounter: Payer: Self-pay | Admitting: Pulmonary Disease

## 2013-07-15 ENCOUNTER — Ambulatory Visit (INDEPENDENT_AMBULATORY_CARE_PROVIDER_SITE_OTHER): Payer: Medicare Other | Admitting: Pulmonary Disease

## 2013-07-15 VITALS — BP 124/82 | HR 101 | Temp 98.1°F | Ht 63.0 in | Wt 138.8 lb

## 2013-07-15 DIAGNOSIS — R059 Cough, unspecified: Secondary | ICD-10-CM

## 2013-07-15 DIAGNOSIS — B370 Candidal stomatitis: Secondary | ICD-10-CM

## 2013-07-15 DIAGNOSIS — K219 Gastro-esophageal reflux disease without esophagitis: Secondary | ICD-10-CM

## 2013-07-15 DIAGNOSIS — J438 Other emphysema: Secondary | ICD-10-CM

## 2013-07-15 DIAGNOSIS — R079 Chest pain, unspecified: Secondary | ICD-10-CM

## 2013-07-15 DIAGNOSIS — R053 Chronic cough: Secondary | ICD-10-CM

## 2013-07-15 DIAGNOSIS — R05 Cough: Secondary | ICD-10-CM

## 2013-07-15 MED ORDER — FLUCONAZOLE 100 MG PO TABS: ORAL_TABLET | ORAL | Status: DC

## 2013-07-15 NOTE — Patient Instructions (Signed)
Fluconazole 100 mg pill >> 2 pills on day one, then 1 pill daily for 13 days Stop pulmicort Follow up with Dr. Halford Chessman or Tammy Parrett in two weeks

## 2013-07-15 NOTE — Progress Notes (Signed)
Chief Complaint  Patient presents with  . Follow-up    Recent abx and pred 10mg  for increased chest congestion- 1 week ago. Pt c/o SOB, lightheaded, blue tint in color to lips, burning sensation in esophagus/chest, chest tenderness with cough and lower chest/back discomfort. Pt states that this has been worsening x 2 weeks.     History of Present Illness: Kimberly Kemp is a 48 y.o. female former smoker with chronic cough, emphysema, rhinitis, and GERD.  She continues to have pain in her chest.  She feels this in the middle of her chest now.  It is a burning pain, and feels it hurts when she swallows.  She has been getting more difficulty with her reflux.  She has noticed being more hoarse for the past one week.  Her sinuses have been okay.    She was seen previously by Dr. Olevia Perches for her reflux.  Tests: Echo 04/15/06>>EF 55 to 65% Spirometry 10/26/09>>FEV1 2.59(88%), FEV1% 73 CT chest 06/26/10>>upper lobe emphysema Home sleep study 09/13/10>>AHI 1, SpO2 low 91% CXR 05/22/11>>hyperinflation CT chest 06/10/13 >> minimal centrilobular emphysema  She  has a past medical history of Hypertension; Emphysema; GERD (gastroesophageal reflux disease); Chronic pain; Tobacco abuse; Anxiety; Paroxysmal atrial fibrillation; Cirrhosis; COPD (chronic obstructive pulmonary disease); Asthma; Depression; Pulmonary embolism; Hepatitis B; and Stroke.  She  has past surgical history that includes Bronchoscopy (March 2008, Feb 2012); Cesarean section; and Appendectomy.  Current Outpatient Prescriptions on File Prior to Visit  Medication Sig Dispense Refill  . albuterol (PROVENTIL) (2.5 MG/3ML) 0.083% nebulizer solution USE 3 MLS (2.5 MG TOTAL) BY NEBULIZATION EVERY 6 (SIX) HOURS AS NEEDED FOR WHEEZING.  375 mL  5  . albuterol (VENTOLIN HFA) 108 (90 BASE) MCG/ACT inhaler Inhale 2 puffs into the lungs every 4 (four) hours as needed for wheezing. Up to four times daily as needed  1 Inhaler  5  . arformoterol (BROVANA)  15 MCG/2ML NEBU Take 2 mLs (15 mcg total) by nebulization 2 (two) times daily. Dx 492.8  120 mL  6  . budesonide (PULMICORT) 0.25 MG/2ML nebulizer solution Take 2 mL (0.25 mg total) by nebulization 2 (two) times daily. DX:  492.8  120 mL  6  . buPROPion (WELLBUTRIN SR) 150 MG 12 hr tablet 150 mg 2 (two) times daily.      Marland Kitchen dextromethorphan-guaiFENesin (MUCINEX DM) 30-600 MG per 12 hr tablet Take 1 tablet by mouth every 12 (twelve) hours.        Marland Kitchen HYDROcodone-homatropine (HYCODAN) 5-1.5 MG/5ML syrup TAKE 5ML BY MOUTH EVERY 6 HOURS AS NEEDED FOR COUGH  240 mL  0  . ipratropium (ATROVENT) 0.02 % nebulizer solution USE 1.25 ML EVERY 4 HOURS AS NEEDED FOR WHEEZING.Marland KitchenMarland KitchenDX 492.8  250 mL  4  . Nutritional Supplements (ENSURE PLUS PO) Take by mouth 3 (three) times daily.        Marland Kitchen nystatin (MYCOSTATIN) 100000 UNIT/ML suspension TAKE 1 TEASPOONFUL GARGLE AND SWALLOW 4 TIMES A DAY  120 mL  1  . omeprazole (PRILOSEC) 40 MG capsule TAKE 1 CAPSULE (40 MG TOTAL) BY MOUTH 2 (TWO) TIMES DAILY.  60 capsule  5  . oxyCODONE-acetaminophen (ROXICET) 5-325 MG per tablet Take 1 tablet by mouth every 6 (six) hours as needed for severe pain.  30 tablet  0  . promethazine (PHENERGAN) 25 MG tablet Take 1 tablet by mouth 2 (two) times daily as needed.      . zolpidem (AMBIEN) 5 MG tablet Take 1 tablet by mouth at bedtime  as needed.       No current facility-administered medications on file prior to visit.    Allergies  Allergen Reactions  . Penicillins     Physical Exam:  General - No distress HEENT - no sinus tenderness, changes of thrush in posterior pharynx, no LAN Cardiac - s1s2 regular Chest - no wheeze/rales Abdomen - soft, nontender Extremities - no edema Skin - no rashes Neurologic - normal strength Psychiatric - normal mood, behavior   Assessment/Plan:  Kimberly Mires, MD [place] Pager:  [phone]

## 2013-07-22 DIAGNOSIS — B370 Candidal stomatitis: Secondary | ICD-10-CM | POA: Insufficient documentation

## 2013-07-22 NOTE — Assessment & Plan Note (Signed)
No specific pulmonary cause for this.  Could be related to thrush and GERD.  Advised her to d/w her PCP if she needs additional pain medications.

## 2013-07-22 NOTE — Assessment & Plan Note (Signed)
She has minimal findings on CT chest and spirometry.  She reports benefit from nebulizer therapy.  Will continue brovana, and prn ipratropium/albuterol for now.   Will have her stop pulmicort in setting of thrush and marginal clinical benefit.

## 2013-07-22 NOTE — Assessment & Plan Note (Signed)
She can continue prn hycodan for now.

## 2013-07-22 NOTE — Assessment & Plan Note (Signed)
She is to continue omeprazole.  She was seen previously by Dr. Olevia Perches.  If her symptoms persists, the she may need GI follow up.

## 2013-07-22 NOTE — Assessment & Plan Note (Signed)
This could be contributing to her chest discomfort.  Will give course of diflucan.

## 2013-07-27 ENCOUNTER — Ambulatory Visit: Payer: Medicare Other | Admitting: Adult Health

## 2013-07-30 ENCOUNTER — Other Ambulatory Visit: Payer: Self-pay | Admitting: Pulmonary Disease

## 2013-08-02 ENCOUNTER — Telehealth: Payer: Self-pay | Admitting: Pulmonary Disease

## 2013-08-02 MED ORDER — HYDROCODONE-HOMATROPINE 5-1.5 MG/5ML PO SYRP: ORAL_SOLUTION | ORAL | Status: DC

## 2013-08-02 NOTE — Addendum Note (Signed)
Addended by: Rosana Berger on: 08/02/2013 05:26 PM   Modules accepted: Orders

## 2013-08-02 NOTE — Telephone Encounter (Signed)
Refilled Printed - given to SN to sign Pt requests this be mailed.  envelope attached to rx-- to be mailed once signed.   Nothing further needed.

## 2013-08-02 NOTE — Telephone Encounter (Signed)
Called and spoke with pt and she is requesting a refill of the hycodan cough meds.  VS is not in the office today to sign the rx so will forward to SN---doc of the day to advise if ok to refill.    Last refill was 07/05/13  Last ov--07/15/13  Allergies  Allergen Reactions  . Penicillins     Current Outpatient Prescriptions on File Prior to Visit  Medication Sig Dispense Refill  . albuterol (PROVENTIL) (2.5 MG/3ML) 0.083% nebulizer solution USE 3 MLS (2.5 MG TOTAL) BY NEBULIZATION EVERY 6 (SIX) HOURS AS NEEDED FOR WHEEZING.  375 mL  5  . albuterol (VENTOLIN HFA) 108 (90 BASE) MCG/ACT inhaler Inhale 2 puffs into the lungs every 4 (four) hours as needed for wheezing. Up to four times daily as needed  1 Inhaler  5  . arformoterol (BROVANA) 15 MCG/2ML NEBU Take 2 mLs (15 mcg total) by nebulization 2 (two) times daily. Dx 492.8  120 mL  6  . dextromethorphan-guaiFENesin (MUCINEX DM) 30-600 MG per 12 hr tablet Take 1 tablet by mouth every 12 (twelve) hours.        . fluconazole (DIFLUCAN) 100 MG tablet 2 pills on day one, then 1 pill daily  15 tablet  0  . HYDROcodone-homatropine (HYCODAN) 5-1.5 MG/5ML syrup TAKE 5ML BY MOUTH EVERY 6 HOURS AS NEEDED FOR COUGH  240 mL  0  . ipratropium (ATROVENT) 0.02 % nebulizer solution USE 1.25 ML EVERY 4 HOURS AS NEEDED FOR WHEEZING.Marland KitchenMarland KitchenDX 492.8  250 mL  4  . Nutritional Supplements (ENSURE PLUS PO) Take by mouth 3 (three) times daily.        Marland Kitchen omeprazole (PRILOSEC) 40 MG capsule TAKE 1 CAPSULE (40 MG TOTAL) BY MOUTH 2 (TWO) TIMES DAILY.  60 capsule  5  . promethazine (PHENERGAN) 25 MG tablet Take 1 tablet by mouth 2 (two) times daily as needed.      . zolpidem (AMBIEN) 5 MG tablet Take 1 tablet by mouth at bedtime as needed.       No current facility-administered medications on file prior to visit.

## 2013-08-03 ENCOUNTER — Other Ambulatory Visit: Payer: Self-pay | Admitting: Pulmonary Disease

## 2013-08-11 ENCOUNTER — Ambulatory Visit: Payer: Medicare Other | Admitting: Adult Health

## 2013-09-01 ENCOUNTER — Telehealth: Payer: Self-pay | Admitting: Pulmonary Disease

## 2013-09-01 NOTE — Telephone Encounter (Signed)
Pt last had refill on hycodan 08/02/13 #240 ml x 0 refills Last OV w/ VS 07/15/13 Dr. Halford Chessman is not in the office to sign RX so will forward to DOD. Please advise thanks

## 2013-09-01 NOTE — Telephone Encounter (Signed)
Ok to refill this time 

## 2013-09-02 MED ORDER — HYDROCODONE-HOMATROPINE 5-1.5 MG/5ML PO SYRP
ORAL_SOLUTION | ORAL | Status: DC
Start: 2013-09-02 — End: 2013-10-04

## 2013-09-02 NOTE — Telephone Encounter (Signed)
Rx is up front ready for pick up Pt aware--pt requests that Rx be mailed to her home address.  After looking in chart, pt has this mailed to her whenever refilled. Rx placed in mail.  Nothing further needed.

## 2013-09-02 NOTE — Telephone Encounter (Signed)
RX placed on CDY cart for signature and will placed for pick up.  Called pt and LMTCB x1

## 2013-09-02 NOTE — Telephone Encounter (Signed)
Pt returned call.Kimberly Kemp ° °

## 2013-09-04 ENCOUNTER — Other Ambulatory Visit: Payer: Self-pay | Admitting: Pulmonary Disease

## 2013-09-04 NOTE — Telephone Encounter (Signed)
Patient called floor MD pager to report 1 week of worsening lightheadedness with exertion in setting of not feeling "good". Advised patient to go to local ED immediately. Will message Dr. Halford Chessman for follow-up.

## 2013-09-05 ENCOUNTER — Telehealth: Payer: Self-pay | Admitting: Internal Medicine

## 2013-09-05 NOTE — Telephone Encounter (Signed)
Agree this needs to be addressed by PCP or she needs to go to ER for further evaluation.

## 2013-09-05 NOTE — Telephone Encounter (Signed)
Called spoke with pt. She c/o feeling light headed, weak, exhausted, body feels heavy when she walks. Reports she is having to hold onto things when walking. Pt denies any pulmonary problems. Pt has not contacted or seen her PCP. I advised her she needed to call them to get an eval. She will do so. Nothing further needed

## 2013-09-06 ENCOUNTER — Other Ambulatory Visit: Payer: Self-pay | Admitting: Pulmonary Disease

## 2013-09-11 ENCOUNTER — Other Ambulatory Visit: Payer: Self-pay | Admitting: Pulmonary Disease

## 2013-09-13 ENCOUNTER — Telehealth: Payer: Self-pay | Admitting: Pulmonary Disease

## 2013-09-13 MED ORDER — PULMICORT 0.25 MG/2ML IN SUSP: RESPIRATORY_TRACT | Status: DC

## 2013-09-13 NOTE — Telephone Encounter (Signed)
Called spoke with [facility]. Was advised pt has [place] medicaid. They need pulmicort RX to state DAW in order for them to pay for this. RX re sent. Nothing further needed

## 2013-09-14 ENCOUNTER — Telehealth: Payer: Self-pay | Admitting: Pulmonary Disease

## 2013-09-14 MED ORDER — PULMICORT 0.25 MG/2ML IN SUSP: RESPIRATORY_TRACT | Status: DC

## 2013-09-14 NOTE — Telephone Encounter (Signed)
rx for the pulmicort has been sent in again with DAW on the rx.  Nothing further is needed.

## 2013-09-15 ENCOUNTER — Telehealth: Payer: Self-pay | Admitting: Pulmonary Disease

## 2013-09-15 MED ORDER — PULMICORT 0.25 MG/2ML IN SUSP
RESPIRATORY_TRACT | Status: DC
Start: 2013-09-15 — End: 2014-02-17

## 2013-09-15 NOTE — Telephone Encounter (Signed)
Pulmicort rx reprinted per CY to include :  Needs to have DISPENSE BRAND NAME handwritten on Rx.  Medicaid requires brand name be written on rx  Medicare requires dx code and hand signed Rx

## 2013-09-15 NOTE — Telephone Encounter (Signed)
[facility] to [facility] [phone] Nothing further needed.

## 2013-09-15 NOTE — Telephone Encounter (Signed)
Requesting refill authorization: Needing a hand signed Rx faxed to pharmacy for Pulmicort. Needs to have DISPENSE BRAND NAME handwritten on Rx. Medicaid requires brand name be written on rx Medicare requires dx code and hand signed Rx  Needs to [facility] to [facility] at [phone]  Please advise Dr Annamaria Boots if you are okay with signing this in Dr Collie Siad absence. Thanks.

## 2013-10-03 ENCOUNTER — Telehealth: Payer: Self-pay | Admitting: Pulmonary Disease

## 2013-10-03 NOTE — Telephone Encounter (Signed)
Called and spoke with pt and she is requesting a refill of the hycodan be mailed to her.  Last filled on 09/02/13.  Pt is aware we will send this to VS for recs.  VS please advise. Thanks  Allergies  Allergen Reactions  . Penicillins     Current Outpatient Prescriptions on File Prior to Visit  Medication Sig Dispense Refill  . albuterol (PROVENTIL) (2.5 MG/3ML) 0.083% nebulizer solution USE 3 MLS (2.5 MG TOTAL) BY NEBULIZATION EVERY 6 (SIX) HOURS AS NEEDED FOR WHEEZING.  375 mL  5  . albuterol (PROVENTIL) (2.5 MG/3ML) 0.083% nebulizer solution 1 vial every 6 hours as needed DX 492.8  375 mL  5  . arformoterol (BROVANA) 15 MCG/2ML NEBU Take 2 mLs (15 mcg total) by nebulization 2 (two) times daily. Dx 492.8  120 mL  6  . dextromethorphan-guaiFENesin (MUCINEX DM) 30-600 MG per 12 hr tablet Take 1 tablet by mouth every 12 (twelve) hours.        . fluconazole (DIFLUCAN) 100 MG tablet 2 pills on day one, then 1 pill daily  15 tablet  0  . HYDROcodone-homatropine (HYCODAN) 5-1.5 MG/5ML syrup TAKE 5ML BY MOUTH EVERY 6 HOURS AS NEEDED FOR COUGH  240 mL  0  . ipratropium (ATROVENT) 0.02 % nebulizer solution USE 1.25 ML EVERY 4 HOURS AS NEEDED FOR WHEEZING.Marland KitchenMarland KitchenDX 492.8  250 mL  4  . Nutritional Supplements (ENSURE PLUS PO) Take by mouth 3 (three) times daily.        Marland Kitchen omeprazole (PRILOSEC) 40 MG capsule TAKE 1 CAPSULE (40 MG TOTAL) BY MOUTH 2 (TWO) TIMES DAILY.  60 capsule  5  . PROAIR HFA 108 (90 BASE) MCG/ACT inhaler INHALE 2 PUFFS INTO THE LUNGS EVERY FOUR HOURS AS NEEDED FOR WHEEZING  8.5 each  4  . promethazine (PHENERGAN) 25 MG tablet Take 1 tablet by mouth 2 (two) times daily as needed.      Marland Kitchen PULMICORT 0.25 MG/2ML nebulizer solution TAKE 2 ML (0.25 MG TOTAL) BY NEBULIZATION 2 (TWO) TIMES DAILY. DX: 492.8  120 mL  6  . PULMICORT 0.25 MG/2ML nebulizer solution TAKE 2 ML (0.25 MG TOTAL) BY NEBULIZATION 2 (TWO) TIMES DAILY. DX: 492.8  120 mL  6  . zolpidem (AMBIEN) 5 MG tablet Take 1 tablet by mouth at  bedtime as needed.       No current facility-administered medications on file prior to visit.

## 2013-10-04 MED ORDER — HYDROCODONE-HOMATROPINE 5-1.5 MG/5ML PO SYRP: ORAL_SOLUTION | ORAL | Status: DC

## 2013-10-04 NOTE — Telephone Encounter (Signed)
Okay to refill hycodan

## 2013-10-04 NOTE — Telephone Encounter (Signed)
Pt aware we will mail her RX once signed. Nothing further needed

## 2013-10-30 ENCOUNTER — Other Ambulatory Visit: Payer: Self-pay | Admitting: Internal Medicine

## 2013-11-01 ENCOUNTER — Telehealth: Payer: Self-pay | Admitting: Pulmonary Disease

## 2013-11-01 NOTE — Telephone Encounter (Signed)
Pt last OV 07/15/13. Pt last refill 10/04/13 #240 ml x 0 refills Pt has no pending appt. Please advise Dr. Halford Chessman thanks

## 2013-11-02 NOTE — Telephone Encounter (Signed)
I spoke with the pt and notified that we are awaiting response from VS on cough syrup refill  She verbalized understanding and is aware we will call her once msg is answered Please advise, thanks!

## 2013-11-02 NOTE — Telephone Encounter (Signed)
Pt states she has not received a returned phone call.  Please call back at [phone].  Kimberly Kemp

## 2013-11-03 MED ORDER — HYDROCODONE-HOMATROPINE 5-1.5 MG/5ML PO SYRP: ORAL_SOLUTION | ORAL | Status: DC

## 2013-11-03 NOTE — Telephone Encounter (Signed)
Yes Ma'am. °

## 2013-11-03 NOTE — Telephone Encounter (Signed)
Dr Annamaria Boots - Dr Halford Chessman is out of the office today - Would you be willing to sign this rx for Hycodan cough syrup that he has approved?  Please advise

## 2013-11-03 NOTE — Telephone Encounter (Signed)
Okay to refill cough medicine.

## 2013-11-03 NOTE — Telephone Encounter (Signed)
rx has been signed by CY and placed in the mail per pts request.

## 2013-11-08 ENCOUNTER — Telehealth: Payer: Self-pay | Admitting: Pulmonary Disease

## 2013-11-08 NOTE — Telephone Encounter (Signed)
Pt is checking back in.  Wants this taken care of so she can p/u the Rx today.  Kimberly Kemp

## 2013-11-08 NOTE — Telephone Encounter (Signed)
Hycodan rx was signed by CY and sent to medical records to be mailed on 10/1 - likely did not get sent to [employer] until Friday 10/2.  Given this fact, pt should receive her rx in the mail tomorrow 10/7.  Called spoke with patient and discussed the above with her.  Pt okay with this and voiced her understanding.  She did ask for some otc alternatives to last until she receives her rx >> recommended Delsym and/or Mucinex/Robitussin syrup.  Pt voiced her understanding in this as well.  She will call the office again if she does not receive her rx.  Nothing further needed at this time; will sign off.

## 2013-12-05 ENCOUNTER — Telehealth: Payer: Self-pay | Admitting: Pulmonary Disease

## 2013-12-05 MED ORDER — HYDROCODONE-HOMATROPINE 5-1.5 MG/5ML PO SYRP: ORAL_SOLUTION | ORAL | Status: DC

## 2013-12-05 NOTE — Telephone Encounter (Signed)
Okay to refill hycodan.  Give 3 refills.

## 2013-12-05 NOTE — Telephone Encounter (Signed)
Last ov 6.12.15 w/ VS: Patient Instructions       Fluconazole 100 mg pill >> 2 pills on day one, then 1 pill daily for 13 days Stop pulmicort Follow up with Dr. Halford Chessman or Tammy Parrett in two weeks   Called spoke with patient who is requesting a refill on her Hycodan cough syrup. This was last refilled: 09-02-13 #21mL 10-04-13 #296mL 11-03-13 #220mL  Advised pt will need authorization from VS to fill this medication.  Pt verbalized her understanding. Dr Halford Chessman please advise if okay to fill.  Thank you.

## 2013-12-05 NOTE — Telephone Encounter (Signed)
I called spoke with pt. She wants RX mailed to her. RX placed in VS look at. Since this is now controlled we can not give refills and pt is aware. Will forward to [place] to f/u on once done. Thanks

## 2013-12-06 NOTE — Telephone Encounter (Signed)
Pt aware that Dr Halford Chessman not in office until 12/07/13 after 1:30 Pt can pick up rx at this time.

## 2013-12-06 NOTE — Telephone Encounter (Signed)
Pt calling to let us know that she wants to come by tomorrow to pick-up prescript.Kimberly Kemp

## 2013-12-07 NOTE — Telephone Encounter (Signed)
Script signed and given to pt.

## 2013-12-15 ENCOUNTER — Telehealth: Payer: Self-pay | Admitting: Pulmonary Disease

## 2013-12-15 MED ORDER — CLARITHROMYCIN 500 MG PO TABS: 500.0000 mg | ORAL_TABLET | Freq: Two times a day (BID) | ORAL | Status: DC

## 2013-12-15 NOTE — Telephone Encounter (Signed)
Please offer biaxin 500 mg, # 14, 1 twice daily If she gets worse this weekend go to ER Have her call back first of week to let us know how she is doing and to establish f/u with Dr Halford Chessman

## 2013-12-15 NOTE — Telephone Encounter (Signed)
Pt aware of recs. RX sent in. Nothing further needed 

## 2013-12-15 NOTE — Telephone Encounter (Signed)
Last OV 07-2013. Dr. Halford Chessman patient. I spoke with the pt and she is c/o having rattling sound in her chest, difficulty taking a deep breath, dry cough, wheezing, body aches and chills x 2 weeks. She states it has continued to get worse over the last week. She has not tried anything OTC for this. There are no appointments available this week. Please advise. [place] Bing, CMA  Allergies  Allergen Reactions  . Penicillins    Current Outpatient Prescriptions on File Prior to Visit  Medication Sig Dispense Refill  . albuterol (PROVENTIL) (2.5 MG/3ML) 0.083% nebulizer solution USE 3 MLS (2.5 MG TOTAL) BY NEBULIZATION EVERY 6 (SIX) HOURS AS NEEDED FOR WHEEZING. 375 mL 5  . albuterol (PROVENTIL) (2.5 MG/3ML) 0.083% nebulizer solution 1 vial every 6 hours as needed DX 492.8 375 mL 5  . arformoterol (BROVANA) 15 MCG/2ML NEBU Take 2 mLs (15 mcg total) by nebulization 2 (two) times daily. Dx 492.8 120 mL 6  . dextromethorphan-guaiFENesin (MUCINEX DM) 30-600 MG per 12 hr tablet Take 1 tablet by mouth every 12 (twelve) hours.      . fluconazole (DIFLUCAN) 100 MG tablet 2 pills on day one, then 1 pill daily 15 tablet 0  . HYDROcodone-homatropine (HYCODAN) 5-1.5 MG/5ML syrup TAKE 5ML BY MOUTH EVERY 6 HOURS AS NEEDED FOR COUGH 240 mL 0  . ipratropium (ATROVENT) 0.02 % nebulizer solution USE 1.25 ML EVERY 4 HOURS AS NEEDED FOR WHEEZING.Marland KitchenMarland KitchenDX 492.8 250 mL 4  . Nutritional Supplements (ENSURE PLUS PO) Take by mouth 3 (three) times daily.      Marland Kitchen omeprazole (PRILOSEC) 40 MG capsule TAKE 1 CAPSULE (40 MG TOTAL) BY MOUTH 2 (TWO) TIMES DAILY. 60 capsule 2  . PROAIR HFA 108 (90 BASE) MCG/ACT inhaler INHALE 2 PUFFS INTO THE LUNGS EVERY FOUR HOURS AS NEEDED FOR WHEEZING 8.5 each 4  . promethazine (PHENERGAN) 25 MG tablet Take 1 tablet by mouth 2 (two) times daily as needed.    Marland Kitchen PULMICORT 0.25 MG/2ML nebulizer solution TAKE 2 ML (0.25 MG TOTAL) BY NEBULIZATION 2 (TWO) TIMES DAILY. DX: 492.8 120 mL 6  . PULMICORT 0.25  MG/2ML nebulizer solution TAKE 2 ML (0.25 MG TOTAL) BY NEBULIZATION 2 (TWO) TIMES DAILY. DX: 492.8 120 mL 6  . zolpidem (AMBIEN) 5 MG tablet Take 1 tablet by mouth at bedtime as needed.     No current facility-administered medications on file prior to visit.

## 2013-12-27 ENCOUNTER — Telehealth: Payer: Self-pay | Admitting: Pulmonary Disease

## 2013-12-27 MED ORDER — BENZONATATE 200 MG PO CAPS: 200.0000 mg | ORAL_CAPSULE | Freq: Three times a day (TID) | ORAL | Status: DC | PRN

## 2013-12-27 NOTE — Telephone Encounter (Signed)
Called spoke with pt. She reports she has moved in with mother in law and she has 5 animals. This has increased her cough. She is taking her hycodan TID.   Spoke w/ VS and he reports to call in tess pearls 200 mg TID PRN #30 x 0 refills  Called pt back and is aware of recs. [facility] [address]. Nothing further needed

## 2014-01-02 ENCOUNTER — Telehealth: Payer: Self-pay | Admitting: Pulmonary Disease

## 2014-01-02 NOTE — Telephone Encounter (Signed)
Noted  

## 2014-01-02 NOTE — Telephone Encounter (Signed)
Pt aware of rec's per Dr Halford Chessman.  Pt refused to schedule appt at this time.  Will send to Dr Halford Chessman as Juluis Rainier Nothing further needed.

## 2014-01-02 NOTE — Telephone Encounter (Signed)
Last refill on 12-05-13 for hycodan cough syrup #240 ml given. Pt is asking for a refill. Please advise. [place] Bing, CMA  Last OV: 07/15/13 Next OV: none scheduled

## 2014-01-02 NOTE — Telephone Encounter (Signed)
She is using too much hycodan.  Needs to have ROV to discuss options for alternative therapies for cough suppression.

## 2014-01-19 ENCOUNTER — Ambulatory Visit: Payer: Medicare Other | Admitting: Pulmonary Disease

## 2014-01-19 ENCOUNTER — Telehealth: Payer: Self-pay | Admitting: Pulmonary Disease

## 2014-01-19 MED ORDER — PREDNISONE 10 MG PO TABS: ORAL_TABLET | ORAL | Status: DC

## 2014-01-19 NOTE — Telephone Encounter (Signed)
ATC PT NA. VM not set up yet. WCB 

## 2014-01-19 NOTE — Telephone Encounter (Signed)
Spoke with the pt and notified of recs per VS Pt verbalized understanding  Rx was sent to pharm

## 2014-01-19 NOTE — Telephone Encounter (Signed)
Can send script for prednisone 10 mg pill >> 3 pills daily for 2 days, 2 pills daily for 2 days, 1 pill daily for 2 days.  Dispense 12 pills with no refills. 

## 2014-01-19 NOTE — Telephone Encounter (Signed)
Spoke with the pt  She states that ever since moved in with her mother in law approx 3 months ago her cough is much worse  The house she is living in has 5 dogs  She feels that she may be allergic to the dog dander- I advised her to avoid if possible, esp where she sleeps  Her cough is non prod and sometimes wakes her up in the night with "wheezing in the bottom of my lung" She states that her breathing is about the same since she was last seen  She states that "sometimes when I wake up my lips are blue" Not on o2  She is using albuterol HFA and neb approx 6 times per day and has tried delsym and mucinex for the cough I offered appt, but she refused due to lack of transportation  She is asking for recs from VS  Please advise, thanks!

## 2014-02-01 ENCOUNTER — Other Ambulatory Visit: Payer: Self-pay | Admitting: Pulmonary Disease

## 2014-02-01 ENCOUNTER — Other Ambulatory Visit: Payer: Self-pay | Admitting: Internal Medicine

## 2014-02-17 ENCOUNTER — Ambulatory Visit (INDEPENDENT_AMBULATORY_CARE_PROVIDER_SITE_OTHER): Payer: Medicare Other | Admitting: Pulmonary Disease

## 2014-02-17 ENCOUNTER — Encounter: Payer: Self-pay | Admitting: Pulmonary Disease

## 2014-02-17 VITALS — BP 148/92 | HR 106 | Temp 98.1°F | Ht 63.0 in | Wt 123.8 lb

## 2014-02-17 DIAGNOSIS — J438 Other emphysema: Secondary | ICD-10-CM | POA: Diagnosis not present

## 2014-02-17 DIAGNOSIS — B370 Candidal stomatitis: Secondary | ICD-10-CM

## 2014-02-17 DIAGNOSIS — R05 Cough: Secondary | ICD-10-CM

## 2014-02-17 DIAGNOSIS — R053 Chronic cough: Secondary | ICD-10-CM

## 2014-02-17 MED ORDER — CHLORPHENIRAMINE MALEATE 2 MG/ML PO LIQD: 2.0000 mL | Freq: Four times a day (QID) | ORAL | Status: DC | PRN

## 2014-02-17 MED ORDER — FLUCONAZOLE 100 MG PO TABS: ORAL_TABLET | ORAL | Status: DC

## 2014-02-17 NOTE — Progress Notes (Signed)
Chief Complaint  Patient presents with  . Acute Visit    coughing up thick yellow/red mucus; chest tightness; no fever; nausea (needs refill of Phenergan)    History of Present Illness: Kimberly Kemp is a 49 y.o. female former smoker with chronic cough, emphysema, rhinitis, and GERD.  She has recurrent cough.  She is bringing up thick sputum that is sometimes yellow in color.  She is feeling hoarse, and gets wheeze in her throat >> this is worse at night when she lays flat.  She denies fever, chest pain, sinus congestion, leg swelling, or rash.  She denies smoking.  She has been using duoneb every 4 hours.  Tests: Echo 04/15/06>>EF 55 to 65% A1AT 09/04/06 >> 236 Spirometry 10/26/09>>FEV1 2.59(88%), FEV1% 73 RAST 01/22/10 >> negative, IgE 11.9 Labs 01/22/10 >> ACE 46, RF < 20, ANA negative, ANCA negative CT chest 06/26/10>>upper lobe emphysema Home sleep study 09/13/10>>AHI 1, SpO2 low 91% CXR 05/22/11>>hyperinflation CT chest 06/10/13 >> minimal centrilobular emphysema  PMHx >> HTN, GERD, Anxiety, PAF, Hepatitis B, Cirrhosis, Depression, PE, CVA  PSHx, Medications, Allergies, Fhx, Shx reviewed.   Physical Exam: Blood pressure 148/92, pulse 106, temperature 98.1 F (36.7 C), temperature source Oral, height 5\' 3"  (1.6 m), weight 123 lb 12.8 oz (56.155 kg), SpO2 99 %. Body mass index is 21.94 kg/(m^2).  General - No distress HEENT - no sinus tenderness, changes of thrush in posterior pharynx, no LAN Cardiac - s1s2 regular Chest - no wheeze/rales Abdomen - soft, nontender Extremities - no edema Skin - no rashes Neurologic - normal strength Psychiatric - normal mood, behavior   Assessment/Plan:  Chronic cough. Related to GERD, emphysema. Plan: - try to avoid opiate cough medications - continue mucinex, tessalon - advised her to try using chlorpheniramine prn   Thrush. Plan: - will give course of diflucan - discussed importance of rinsing mouth after using inhaled  steroids - ? If she might need HIV testing in setting of recurrent thrush  Emphysema. Would like to repeat PFT's >> has been difficulty to coordinate with her persistent symptoms of cough. Plan: - continue pulmicort, brovana, duoneb  GERD. Plan: - continue omeprazole   Chesley Mires, MD [place] Pager:  [phone]

## 2014-02-17 NOTE — Patient Instructions (Addendum)
Fluconazole 100 mg >> 2 pills on day one, then one pill daily Chlorpheniramine 4 mg every four hours as needed for cough Follow up in 6 months Call after 1 week if not feeling better

## 2014-02-26 ENCOUNTER — Other Ambulatory Visit: Payer: Self-pay | Admitting: Internal Medicine

## 2014-03-08 ENCOUNTER — Telehealth: Payer: Self-pay | Admitting: Pulmonary Disease

## 2014-03-08 MED ORDER — PREDNISONE 10 MG PO TABS: ORAL_TABLET | ORAL | Status: DC

## 2014-03-08 NOTE — Telephone Encounter (Signed)
We can treat her with an 8 day prednisone pulse to see if helps.  If does not, she needs ov.

## 2014-03-08 NOTE — Telephone Encounter (Signed)
Called and spoke with pt and she is aware of [place] recs.  She will try the prednisone taper and if this does not work she will call back for an appt.  Nothing further is needed.

## 2014-03-08 NOTE — Telephone Encounter (Signed)
Called and spoke with pt and she stated that she was seen by VS on 02/17/14 and she is still having increase trouble breathing and a dry cough that is getting worse.  She stated that this has been going on for about 3 days.  She denies any fever.  Pt stated that her breathing is worse with movement.  Pt is wanting further recs.  Please advise.  VS is out of the office today.   Thanks  Allergies  Allergen Reactions  . Penicillins     Current Outpatient Prescriptions on File Prior to Visit  Medication Sig Dispense Refill  . albuterol (PROVENTIL) (2.5 MG/3ML) 0.083% nebulizer solution USE 3 MLS (2.5 MG TOTAL) BY NEBULIZATION EVERY 6 (SIX) HOURS AS NEEDED FOR WHEEZING. 375 mL 5  . albuterol (PROVENTIL) (2.5 MG/3ML) 0.083% nebulizer solution 1 vial every 6 hours as needed DX 492.8 375 mL 5  . arformoterol (BROVANA) 15 MCG/2ML NEBU Take 2 mLs (15 mcg total) by nebulization 2 (two) times daily. Dx 492.8 120 mL 6  . benzonatate (TESSALON) 200 MG capsule Take 1 capsule (200 mg total) by mouth 3 (three) times daily as needed for cough. 30 capsule 0  . Chlorpheniramine Maleate 2 MG/ML LIQD Take 2 mLs (4 mg total) by mouth every 6 (six) hours as needed.    . clarithromycin (BIAXIN) 500 MG tablet Take 1 tablet (500 mg total) by mouth 2 (two) times daily. 14 tablet 0  . dextromethorphan-guaiFENesin (MUCINEX DM) 30-600 MG per 12 hr tablet Take 1 tablet by mouth every 12 (twelve) hours.      . fluconazole (DIFLUCAN) 100 MG tablet 2 pills on day one, then 1 pill daily 15 tablet 0  . ipratropium (ATROVENT) 0.02 % nebulizer solution USE 1.25 ML EVERY 4 HOURS AS NEEDED FOR WHEEZING.Marland KitchenMarland KitchenDX 492.8 250 mL 1  . Nutritional Supplements (ENSURE PLUS PO) Take by mouth 3 (three) times daily.      Marland Kitchen omeprazole (PRILOSEC) 40 MG capsule TAKE ONE CAPSULE BY MOUTH TWICE A DAY 60 capsule 2  . PROAIR HFA 108 (90 BASE) MCG/ACT inhaler INHALE 2 PUFFS INTO THE LUNGS EVERY FOUR HOURS AS NEEDED FOR WHEEZING 8.5 each 4  . PULMICORT 0.25  MG/2ML nebulizer solution TAKE 2 ML (0.25 MG TOTAL) BY NEBULIZATION 2 (TWO) TIMES DAILY. DX: 492.8 120 mL 6  . zolpidem (AMBIEN) 5 MG tablet Take 1 tablet by mouth at bedtime as needed.     No current facility-administered medications on file prior to visit.

## 2014-04-04 ENCOUNTER — Other Ambulatory Visit: Payer: Self-pay | Admitting: Pulmonary Disease

## 2014-05-02 ENCOUNTER — Other Ambulatory Visit: Payer: Self-pay | Admitting: Internal Medicine

## 2014-05-03 NOTE — Telephone Encounter (Signed)
Pulmicort rx submitted patient last seen 02/2014. She needs f/u by July 2016.

## 2014-06-10 ENCOUNTER — Other Ambulatory Visit: Payer: Self-pay | Admitting: Internal Medicine

## 2014-06-12 ENCOUNTER — Other Ambulatory Visit: Payer: Self-pay | Admitting: Pulmonary Disease

## 2014-06-27 ENCOUNTER — Ambulatory Visit: Payer: Medicare Other | Admitting: Family

## 2014-06-28 ENCOUNTER — Telehealth: Payer: Self-pay | Admitting: Pulmonary Disease

## 2014-06-28 MED ORDER — AZITHROMYCIN 250 MG PO TABS: ORAL_TABLET | ORAL | Status: DC

## 2014-06-28 NOTE — Telephone Encounter (Signed)
Can send script for Zpak.  She needs to keep appt for 06/30/14.

## 2014-06-28 NOTE — Telephone Encounter (Signed)
Called made pt aware RX sent in. Nothing further needed 

## 2014-06-28 NOTE — Telephone Encounter (Signed)
Called spoke with patient who c/o prod cough with a 'real dark [place]' mucus mixed with BRB - usually spotted or speckled w/ the BRB, wheezing during the night, sharp right lung pain with coughing or deep breathing, nausea x4 weeks total, worse x2 weeks.  Denies any tightness, f/c/s.  Advised pt that ov is really necessary with her current symptoms.  Unfortunately pt has to arrange transportation and needs 3 days to accomplish this - she cannot be seen until Friday 5/27.  Asked pt if there if there is anyone (family member or friend) that can bring her in for appt but pt responds "no."  Appt scheduled with [facility] for 5/27 (only provider with openings; pt declined ov w/ TP).  Advised pt will send message to [facility] for further recs.  [facility] [address] Allergies  Allergen Reactions  . Penicillins    Dr Halford Chessman please advise, thank you.

## 2014-06-30 ENCOUNTER — Encounter: Payer: Self-pay | Admitting: Pulmonary Disease

## 2014-06-30 ENCOUNTER — Ambulatory Visit (INDEPENDENT_AMBULATORY_CARE_PROVIDER_SITE_OTHER): Payer: Medicare Other | Admitting: Pulmonary Disease

## 2014-06-30 ENCOUNTER — Ambulatory Visit (INDEPENDENT_AMBULATORY_CARE_PROVIDER_SITE_OTHER)
Admission: RE | Admit: 2014-06-30 | Discharge: 2014-06-30 | Disposition: A | Discharge status: Home / Self Care | Payer: Medicare Other | Source: Ambulatory Visit | Attending: Pulmonary Disease | Admitting: Pulmonary Disease

## 2014-06-30 VITALS — BP 136/76 | HR 101 | Temp 99.1°F | Ht 63.0 in | Wt 119.0 lb

## 2014-06-30 DIAGNOSIS — J209 Acute bronchitis, unspecified: Secondary | ICD-10-CM | POA: Diagnosis not present

## 2014-06-30 DIAGNOSIS — R042 Hemoptysis: Secondary | ICD-10-CM | POA: Insufficient documentation

## 2014-06-30 DIAGNOSIS — R079 Chest pain, unspecified: Secondary | ICD-10-CM | POA: Diagnosis not present

## 2014-06-30 DIAGNOSIS — J449 Chronic obstructive pulmonary disease, unspecified: Secondary | ICD-10-CM | POA: Diagnosis not present

## 2014-06-30 DIAGNOSIS — R0602 Shortness of breath: Secondary | ICD-10-CM | POA: Diagnosis not present

## 2014-06-30 DIAGNOSIS — R05 Cough: Secondary | ICD-10-CM | POA: Diagnosis not present

## 2014-06-30 MED ORDER — LEVOFLOXACIN 750 MG PO TABS
750.0000 mg | ORAL_TABLET | Freq: Every day | ORAL | Status: DC
Start: 2014-06-30 — End: 2014-10-27

## 2014-06-30 NOTE — Patient Instructions (Signed)
Will start on levaquin 750mg  one a day for 5 days. Will check a chest xray, and call you with results. You need to stop smoking. Keep your followup with Dr. Halford Chessman, but call if you are not getting better.

## 2014-06-30 NOTE — Progress Notes (Signed)
   Subjective:    Patient ID: Kimberly Kemp, female    DOB: 1965-04-05, 49 y.o.   MRN: [id number]  HPI The patient comes in today for an acute sick visit. She is followed here for emphysema, and recurrent bronchitis secondary to ongoing smoking. She has been having increasing chest congestion, cough with [place] colored mucus, and most recently streaky hemoptysis. She was treated with a Z-Pak, but this has not helped her symptoms at all. She has also noticed increased shortness of breath. Unfortunately, she continues to smoke.   Review of Systems  Constitutional: Negative for fever and unexpected weight change.  HENT: Positive for congestion. Negative for dental problem, ear pain, nosebleeds, postnasal drip, rhinorrhea, sinus pressure, sneezing, sore throat and trouble swallowing.   Eyes: Negative for redness and itching.  Respiratory: Positive for cough, chest tightness and shortness of breath. Negative for wheezing.   Cardiovascular: Negative for palpitations and leg swelling.  Gastrointestinal: Negative for nausea and vomiting.  Genitourinary: Negative for dysuria.  Musculoskeletal: Negative for joint swelling.  Skin: Negative for rash.  Neurological: Negative for headaches.  Hematological: Does not bruise/bleed easily.  Psychiatric/Behavioral: Negative for dysphoric mood. The patient is not nervous/anxious.        Objective:   Physical Exam Thin female in no acute distress Nose without purulence or discharge noted Neck without lymphadenopathy or thyromegaly Chest with very diminished breath sounds diffusely, no active wheezing. Palpable tenderness in her mid back on the right in the mid scapular line. Cardiac exam with regular rate and rhythm Lower extremities without edema, no cyanosis Alert and oriented, moves all 4 extremities.        Assessment & Plan:

## 2014-06-30 NOTE — Assessment & Plan Note (Signed)
The patient is having worsening cough, congestion, and [place] colored mucus with clumps of blood all of which is probably related to acute bronchitis. The patient continues to smoke, and I have stressed to her the importance of total smoking cessation. She understands this will be an ongoing issue for her until she is able to quit. Given her hemoptysis, I will check a chest x-ray today for completeness.

## 2014-09-20 ENCOUNTER — Other Ambulatory Visit: Payer: Self-pay | Admitting: Pulmonary Disease

## 2014-10-03 ENCOUNTER — Telehealth: Payer: Self-pay | Admitting: Pulmonary Disease

## 2014-10-03 MED ORDER — ARFORMOTEROL TARTRATE 15 MCG/2ML IN NEBU: 15.0000 ug | INHALATION_SOLUTION | Freq: Two times a day (BID) | RESPIRATORY_TRACT | Status: DC

## 2014-10-03 MED ORDER — OMEPRAZOLE 40 MG PO CPDR: 40.0000 mg | DELAYED_RELEASE_CAPSULE | Freq: Two times a day (BID) | ORAL | Status: DC

## 2014-10-03 MED ORDER — BUDESONIDE 0.25 MG/2ML IN SUSP: RESPIRATORY_TRACT | Status: DC

## 2014-10-03 MED ORDER — ALBUTEROL SULFATE (2.5 MG/3ML) 0.083% IN NEBU: INHALATION_SOLUTION | RESPIRATORY_TRACT | Status: DC

## 2014-10-03 MED ORDER — ALBUTEROL SULFATE HFA 108 (90 BASE) MCG/ACT IN AERS: INHALATION_SPRAY | RESPIRATORY_TRACT | Status: DC

## 2014-10-03 NOTE — Telephone Encounter (Signed)
Called and spoke with pt Pt stated needing refills on proair, albuterol neb solution, brovana, pulmicort, and omperazole Informed pt that refills would be sent to her pharmacy Pharmacy verified with pt  Nothing further is needed

## 2014-10-27 ENCOUNTER — Emergency Department (HOSPITAL_COMMUNITY): Payer: Medicare Other

## 2014-10-27 ENCOUNTER — Telehealth: Payer: Self-pay | Admitting: Pulmonary Disease

## 2014-10-27 ENCOUNTER — Encounter (HOSPITAL_COMMUNITY): Payer: Self-pay | Admitting: Vascular Surgery

## 2014-10-27 ENCOUNTER — Emergency Department (HOSPITAL_COMMUNITY)
Admission: EM | Admit: 2014-10-27 | Discharge: 2014-10-27 | Disposition: A | Discharge status: Home / Self Care | Payer: Medicare Other | Attending: Emergency Medicine | Admitting: Emergency Medicine

## 2014-10-27 DIAGNOSIS — F17210 Nicotine dependence, cigarettes, uncomplicated: Secondary | ICD-10-CM | POA: Diagnosis not present

## 2014-10-27 DIAGNOSIS — Z720 Tobacco use: Secondary | ICD-10-CM | POA: Insufficient documentation

## 2014-10-27 DIAGNOSIS — R05 Cough: Secondary | ICD-10-CM | POA: Diagnosis not present

## 2014-10-27 DIAGNOSIS — Z8659 Personal history of other mental and behavioral disorders: Secondary | ICD-10-CM | POA: Insufficient documentation

## 2014-10-27 DIAGNOSIS — G8929 Other chronic pain: Secondary | ICD-10-CM | POA: Insufficient documentation

## 2014-10-27 DIAGNOSIS — Z8673 Personal history of transient ischemic attack (TIA), and cerebral infarction without residual deficits: Secondary | ICD-10-CM | POA: Diagnosis not present

## 2014-10-27 DIAGNOSIS — I10 Essential (primary) hypertension: Secondary | ICD-10-CM | POA: Insufficient documentation

## 2014-10-27 DIAGNOSIS — K219 Gastro-esophageal reflux disease without esophagitis: Secondary | ICD-10-CM | POA: Diagnosis not present

## 2014-10-27 DIAGNOSIS — Z8619 Personal history of other infectious and parasitic diseases: Secondary | ICD-10-CM | POA: Insufficient documentation

## 2014-10-27 DIAGNOSIS — R0602 Shortness of breath: Secondary | ICD-10-CM | POA: Diagnosis present

## 2014-10-27 DIAGNOSIS — Z79899 Other long term (current) drug therapy: Secondary | ICD-10-CM | POA: Insufficient documentation

## 2014-10-27 DIAGNOSIS — Z880 Allergy status to penicillin: Secondary | ICD-10-CM | POA: Diagnosis not present

## 2014-10-27 DIAGNOSIS — J209 Acute bronchitis, unspecified: Secondary | ICD-10-CM | POA: Diagnosis not present

## 2014-10-27 DIAGNOSIS — J439 Emphysema, unspecified: Secondary | ICD-10-CM | POA: Insufficient documentation

## 2014-10-27 DIAGNOSIS — Z86711 Personal history of pulmonary embolism: Secondary | ICD-10-CM | POA: Diagnosis not present

## 2014-10-27 LAB — CBC
HCT: 36.5 % (ref 36.0–46.0)
Hemoglobin: 12.3 g/dL (ref 12.0–15.0)
MCH: 28.5 pg (ref 26.0–34.0)
MCHC: 33.7 g/dL (ref 30.0–36.0)
MCV: 84.5 fL (ref 78.0–100.0)
PLATELETS: 320 10*3/uL (ref 150–400)
RBC: 4.32 MIL/uL (ref 3.87–5.11)
RDW: 15.8 % — ABNORMAL HIGH (ref 11.5–15.5)
WBC: 9.8 10*3/uL (ref 4.0–10.5)

## 2014-10-27 LAB — BASIC METABOLIC PANEL
Anion gap: 9 (ref 5–15)
BUN: 12 mg/dL (ref 6–20)
CO2: 20 mmol/L — AB (ref 22–32)
CREATININE: 0.87 mg/dL (ref 0.44–1.00)
Calcium: 9.2 mg/dL (ref 8.9–10.3)
Chloride: 108 mmol/L (ref 101–111)
Glucose, Bld: 108 mg/dL — ABNORMAL HIGH (ref 65–99)
Potassium: 3.7 mmol/L (ref 3.5–5.1)
Sodium: 137 mmol/L (ref 135–145)

## 2014-10-27 LAB — I-STAT TROPONIN, ED: TROPONIN I, POC: 0 ng/mL (ref 0.00–0.08)

## 2014-10-27 MED ORDER — LEVOFLOXACIN 750 MG PO TABS: 750.0000 mg | ORAL_TABLET | Freq: Every day | ORAL | Status: DC

## 2014-10-27 MED ORDER — BENZONATATE 200 MG PO CAPS: 200.0000 mg | ORAL_CAPSULE | Freq: Three times a day (TID) | ORAL | Status: DC | PRN

## 2014-10-27 NOTE — ED Provider Notes (Signed)
CSN: [id number]     Arrival date & time 10/27/14  1704 History   First MD Initiated Contact with Patient 10/27/14 1838     Chief Complaint  Patient presents with  . Shortness of Breath     (Consider location/radiation/quality/duration/timing/severity/associated sxs/prior Treatment) HPI    Kimberly Kemp is a 49 y.o. female who presents for evaluation of cough, sputum with blood in it, chills, frequent diarrhea, and chills. Has not taken her temperature. She is using her usual medications. Her doctor recently started her on new medications for COPD, which she is using. She continues to smoke cigarettes. She requests a narcotic cough suppressant medication. She is able to tolerate liquids well, but when she eats food it tends to cause diarrhea. She has not been on antiemetics recently. No recent treatment with prednisone.   Past Medical History  Diagnosis Date  . Hypertension   . Emphysema   . GERD (gastroesophageal reflux disease)   . Chronic pain   . Tobacco abuse   . Anxiety   . Paroxysmal atrial fibrillation   . Cirrhosis   . COPD (chronic obstructive pulmonary disease)   . Asthma   . Depression   . Pulmonary embolism   . Hepatitis B   . Stroke    Past Surgical History  Procedure Laterality Date  . Bronchoscopy  March 2008, Feb 2012  . Cesarean section    . Appendectomy      microscopic    Family History  Problem Relation Age of Onset  . Emphysema Father   . Prostate cancer Father   . Liver disease Father   . Emphysema Maternal Grandmother   . Diabetes Mother   . Heart disease Mother   . Colon cancer Neg Hx    Social History  Substance Use Topics  . Smoking status: Current Every Day Smoker -- 1.50 packs/day for 36 years    Types: Cigarettes    Last Attempt to Quit: 07/05/2011  . Smokeless tobacco: Never Used     Comment: back to smoking 1ppd X8 mos 06/30/14  . Alcohol Use: No   OB History    No data available     Review of Systems  All other systems  reviewed and are negative.     Allergies  Penicillins  Home Medications   Prior to Admission medications   Medication Sig Start Date End Date Taking? Authorizing Provider  albuterol (PROAIR HFA) 108 (90 BASE) MCG/ACT inhaler INHALE 2 PUFFS INTO THE LUNGS EVERY FOUR HOURS AS NEEDED FOR WHEEZING 10/03/14  Yes Chesley Mires, MD  arformoterol (BROVANA) 15 MCG/2ML NEBU Take 2 mLs (15 mcg total) by nebulization 2 (two) times daily. Dx 492.8 10/03/14  Yes Chesley Mires, MD  aspirin-acetaminophen-caffeine (EXCEDRIN MIGRAINE) [phone] MG per tablet Take 2 tablets by mouth every 6 (six) hours as needed for headache.   Yes Historical Provider, MD  budesonide (PULMICORT) 0.25 MG/2ML nebulizer solution USE 1 VIAL PER NEBULIZER TWICE A DAY 10/03/14  Yes Chesley Mires, MD  Chlorpheniramine Maleate 2 MG/ML LIQD Take 2 mLs (4 mg total) by mouth every 6 (six) hours as needed. 02/17/14  Yes Chesley Mires, MD  dextromethorphan-guaiFENesin (MUCINEX DM) 30-600 MG per 12 hr tablet Take 1 tablet by mouth every 12 (twelve) hours.     Yes Historical Provider, MD  ipratropium (ATROVENT) 0.02 % nebulizer solution USE 1.25 ML EVERY 4 HOURS AS NEEDED FOR WHEEZING.Marland KitchenMarland KitchenDX 492.8 04/04/14  Yes Chesley Mires, MD  omeprazole (PRILOSEC) 40 MG capsule Take 1  capsule (40 mg total) by mouth 2 (two) times daily. 10/03/14  Yes Chesley Mires, MD  zolpidem (AMBIEN) 5 MG tablet Take 5 mg by mouth at bedtime as needed for sleep.    Yes Historical Provider, MD   BP 146/72 mmHg  Pulse 80  Temp(Src) 98.4 F (36.9 C) (Oral)  Resp 13  SpO2 100% Physical Exam  Constitutional: She is oriented to person, place, and time. She appears well-developed and well-nourished. No distress.  Appears older than stated age  HENT:  Head: Normocephalic and atraumatic.  Right Ear: External ear normal.  Left Ear: External ear normal.  Eyes: Conjunctivae and EOM are normal. Pupils are equal, round, and reactive to light.  Neck: Normal range of motion and phonation  normal. Neck supple.  Cardiovascular: Normal rate, regular rhythm and normal heart sounds.   Pulmonary/Chest: Effort normal and breath sounds normal. No respiratory distress. She has no wheezes. She has no rales. She exhibits no bony tenderness.  Occasional dry cough  Abdominal: Soft. There is no tenderness.  Musculoskeletal: Normal range of motion.  Neurological: She is alert and oriented to person, place, and time. No cranial nerve deficit or sensory deficit. She exhibits normal muscle tone. Coordination normal.  Skin: Skin is warm, dry and intact.  Psychiatric: She has a normal mood and affect. Her behavior is normal. Judgment and thought content normal.  Nursing note and vitals reviewed.   ED Course  Procedures (including critical care time)  Findings discussed with patient, all questions answered.  Labs Review Labs Reviewed  BASIC METABOLIC PANEL - Abnormal; Notable for the following:    CO2 20 (*)    Glucose, Bld 108 (*)    All other components within normal limits  CBC - Abnormal; Notable for the following:    RDW 15.8 (*)    All other components within normal limits  I-STAT TROPOININ, ED    Imaging Review Dg Chest 2 View  10/27/2014   CLINICAL DATA:  Cough for the burning sensation in the throat. Chills. Pain on deep inspiration. Symptoms for 3 weeks.  EXAM: CHEST  2 VIEW  COMPARISON:  PA and lateral chest 06/30/2014.  CT chest 06/10/2013.  FINDINGS: The lungs are emphysematous but clear. Heart size is normal. No pneumothorax or pleural effusion.  IMPRESSION: Emphysema without acute disease.   Electronically Signed   By: Inge Rise M.D.   On: 10/27/2014 18:27   I have personally reviewed and evaluated these images and lab results as part of my medical decision-making.   EKG Interpretation   Date/Time:  Friday October 27 2014 17:18:47 EDT Ventricular Rate:  110 PR Interval:  158 QRS Duration: 78 QT Interval:  342 QTC Calculation: 462 R Axis:   53 Text  Interpretation:  Sinus tachycardia Biatrial enlargement Anteroseptal  infarct , age undetermined Abnormal ECG since last tracing no significant  change Confirmed by [facility]  MD, ELLIOTT [phone]) on 10/27/2014 8:48:10 PM      MDM   Final diagnoses:  Acute bronchitis, unspecified organism  Tobacco abuse    Evaluation consistent with bronchitis, acute, without evidence for pneumonia, metabolic instability or suggestion for impending vascular collapse.   Nursing Notes Reviewed/ Care Coordinated Applicable Imaging Reviewed Interpretation of Laboratory Data incorporated into ED treatment  The patient appears reasonably screened and/or stabilized for discharge and I doubt any other medical condition or other [facility] requiring further screening, evaluation, or treatment in the ED at this time prior to discharge.  Plan: Home Medications- Levaquin,  Tessalon; Home Treatments- rest, stop smoking; return here if the recommended treatment, does not improve the symptoms; Recommended follow up- PCP, when necessary   Daleen Bo, MD 10/27/14 2318

## 2014-10-27 NOTE — ED Notes (Signed)
Pt reports to the ED for eval of dry cough, chest tightness, fatigue, sore throat, and pain with deep inspiration. She has been feeling this way x 3 weeks. Reports some nausea and diarrhea but no active vomiting. Hx of COPD. Pt reports chills and possible fever but she is unsure. Pt A&Ox4, resp e/u, and skin warm and dry.

## 2014-10-27 NOTE — Telephone Encounter (Signed)
Called spoke with pt. She reports she is coughing a lot, her lungs are hurting, having a hard time breathing and reports it hurts to breathe, chest pains as well.  Offered pt appt and is not able to come in. Advised pt w/ the weekend coming we really should see her and she reports she will see if she can find someone to take her to the ED and if not she will call EMS. Will forward to VS as an [place]

## 2014-10-27 NOTE — Discharge Instructions (Signed)
Acute Bronchitis Bronchitis is inflammation of the airways that extend from the windpipe into the lungs (bronchi). The inflammation often causes mucus to develop. This leads to a cough, which is the most common symptom of bronchitis.  In acute bronchitis, the condition usually develops suddenly and goes away over time, usually in a couple weeks. Smoking, allergies, and asthma can make bronchitis worse. Repeated episodes of bronchitis may cause further lung problems.  CAUSES Acute bronchitis is most often caused by the same virus that causes a cold. The virus can spread from person to person (contagious) through coughing, sneezing, and touching contaminated objects. SIGNS AND SYMPTOMS   Cough.   Fever.   Coughing up mucus.   Body aches.   Chest congestion.   Chills.   Shortness of breath.   Sore throat.  DIAGNOSIS  Acute bronchitis is usually diagnosed through a physical exam. Your health care provider will also ask you questions about your medical history. Tests, such as chest X-rays, are sometimes done to rule out other conditions.  TREATMENT  Acute bronchitis usually goes away in a couple weeks. Oftentimes, no medical treatment is necessary. Medicines are sometimes given for relief of fever or cough. Antibiotic medicines are usually not needed but may be prescribed in certain situations. In some cases, an inhaler may be recommended to help reduce shortness of breath and control the cough. A cool mist vaporizer may also be used to help thin bronchial secretions and make it easier to clear the chest.  HOME CARE INSTRUCTIONS  Get plenty of rest.   Drink enough fluids to keep your urine clear or pale yellow (unless you have a medical condition that requires fluid restriction). Increasing fluids may help thin your respiratory secretions (sputum) and reduce chest congestion, and it will prevent dehydration.   Take medicines only as directed by your health care provider.  If  you were prescribed an antibiotic medicine, finish it all even if you start to feel better.  Avoid smoking and secondhand smoke. Exposure to cigarette smoke or irritating chemicals will make bronchitis worse. If you are a smoker, consider using nicotine gum or skin patches to help control withdrawal symptoms. Quitting smoking will help your lungs heal faster.   Reduce the chances of another bout of acute bronchitis by washing your hands frequently, avoiding people with cold symptoms, and trying not to touch your hands to your mouth, nose, or eyes.   Keep all follow-up visits as directed by your health care provider.  SEEK MEDICAL CARE IF: Your symptoms do not improve after 1 week of treatment.  SEEK IMMEDIATE MEDICAL CARE IF:  You develop an increased fever or chills.   You have chest pain.   You have severe shortness of breath.  You have bloody sputum.   You develop dehydration.  You faint or repeatedly feel like you are going to pass out.  You develop repeated vomiting.  You develop a severe headache. MAKE SURE YOU:   Understand these instructions.  Will watch your condition.  Will get help right away if you are not doing well or get worse. Document Released: 02/28/2004 Document Revised: 06/06/2013 Document Reviewed: 07/13/2012 ExitCare Patient Information 2015 [employer]. This information is not intended to replace advice given to you by your health care provider. Make sure you discuss any questions you have with your health care provider. Smoking Cessation Quitting smoking is important to your health and has many advantages. However, it is not always easy to quit since nicotine is   a very addictive drug. Oftentimes, people try 3 times or more before being able to quit. This document explains the best ways for you to prepare to quit smoking. Quitting takes hard work and a lot of effort, but you can do it. ADVANTAGES OF QUITTING SMOKING  You will live longer, feel  better, and live better.  Your body will feel the impact of quitting smoking almost immediately.  Within 20 minutes, blood pressure decreases. Your pulse returns to its normal level.  After 8 hours, carbon monoxide levels in the blood return to normal. Your oxygen level increases.  After 24 hours, the chance of having a heart attack starts to decrease. Your breath, hair, and body stop smelling like smoke.  After 48 hours, damaged nerve endings begin to recover. Your sense of taste and smell improve.  After 72 hours, the body is virtually free of nicotine. Your bronchial tubes relax and breathing becomes easier.  After 2 to 12 weeks, lungs can hold more air. Exercise becomes easier and circulation improves.  The risk of having a heart attack, stroke, cancer, or lung disease is greatly reduced.  After 1 year, the risk of coronary heart disease is cut in half.  After 5 years, the risk of stroke falls to the same as a nonsmoker.  After 10 years, the risk of lung cancer is cut in half and the risk of other cancers decreases significantly.  After 15 years, the risk of coronary heart disease drops, usually to the level of a nonsmoker.  If you are pregnant, quitting smoking will improve your chances of having a healthy baby.  The people you live with, especially any children, will be healthier.  You will have extra money to spend on things other than cigarettes. QUESTIONS TO THINK ABOUT BEFORE ATTEMPTING TO QUIT You may want to talk about your answers with your health care provider.  Why do you want to quit?  If you tried to quit in the past, what helped and what did not?  What will be the most difficult situations for you after you quit? How will you plan to handle them?  Who can help you through the tough times? Your family? Friends? A health care provider?  What pleasures do you get from smoking? What ways can you still get pleasure if you quit? Here are some questions to ask  your health care provider:  How can you help me to be successful at quitting?  What medicine do you think would be best for me and how should I take it?  What should I do if I need more help?  What is smoking withdrawal like? How can I get information on withdrawal? GET READY  Set a quit date.  Change your environment by getting rid of all cigarettes, ashtrays, matches, and lighters in your home, car, or work. Do not let people smoke in your home.  Review your past attempts to quit. Think about what worked and what did not. GET SUPPORT AND ENCOURAGEMENT You have a better chance of being successful if you have help. You can get support in many ways.  Tell your family, friends, and coworkers that you are going to quit and need their support. Ask them not to smoke around you.  Get individual, group, or telephone counseling and support. Programs are available at local hospitals and health centers. Call your local health department for information about programs in your area.  Spiritual beliefs and practices may help some smokers quit.  Download   a "quit meter" on your computer to keep track of quit statistics, such as how long you have gone without smoking, cigarettes not smoked, and money saved.  Get a self-help book about quitting smoking and staying off tobacco. LEARN NEW SKILLS AND BEHAVIORS  Distract yourself from urges to smoke. Talk to someone, go for a walk, or occupy your time with a task.  Change your normal routine. Take a different route to work. Drink tea instead of coffee. Eat breakfast in a different place.  Reduce your stress. Take a hot bath, exercise, or read a book.  Plan something enjoyable to do every day. Reward yourself for not smoking.  Explore interactive web-based programs that specialize in helping you quit. GET MEDICINE AND USE IT CORRECTLY Medicines can help you stop smoking and decrease the urge to smoke. Combining medicine with the above behavioral  methods and support can greatly increase your chances of successfully quitting smoking.  Nicotine replacement therapy helps deliver nicotine to your body without the negative effects and risks of smoking. Nicotine replacement therapy includes nicotine gum, lozenges, inhalers, nasal sprays, and skin patches. Some may be available over-the-counter and others require a prescription.  Antidepressant medicine helps people abstain from smoking, but how this works is unknown. This medicine is available by prescription.  Nicotinic receptor partial agonist medicine simulates the effect of nicotine in your brain. This medicine is available by prescription. Ask your health care provider for advice about which medicines to use and how to use them based on your health history. Your health care provider will tell you what side effects to look out for if you choose to be on a medicine or therapy. Carefully read the information on the package. Do not use any other product containing nicotine while using a nicotine replacement product.  RELAPSE OR DIFFICULT SITUATIONS Most relapses occur within the first 3 months after quitting. Do not be discouraged if you start smoking again. Remember, most people try several times before finally quitting. You may have symptoms of withdrawal because your body is used to nicotine. You may crave cigarettes, be irritable, feel very hungry, cough often, get headaches, or have difficulty concentrating. The withdrawal symptoms are only temporary. They are strongest when you first quit, but they will go away within 10-14 days. To reduce the chances of relapse, try to:  Avoid drinking alcohol. Drinking lowers your chances of successfully quitting.  Reduce the amount of caffeine you consume. Once you quit smoking, the amount of caffeine in your body increases and can give you symptoms, such as a rapid heartbeat, sweating, and anxiety.  Avoid smokers because they can make you want to  smoke.  Do not let weight gain distract you. Many smokers will gain weight when they quit, usually less than 10 pounds. Eat a healthy diet and stay active. You can always lose the weight gained after you quit.  Find ways to improve your mood other than smoking. FOR MORE INFORMATION  www.smokefree.gov  Document Released: 01/14/2001 Document Revised: 06/06/2013 Document Reviewed: 05/01/2011 ExitCare Patient Information 2015 [employer]. This information is not intended to replace advice given to you by your health care provider. Make sure you discuss any questions you have with your health care provider.  

## 2014-10-27 NOTE — Telephone Encounter (Signed)
Noted  

## 2014-11-08 ENCOUNTER — Telehealth: Payer: Self-pay | Admitting: Pulmonary Disease

## 2014-11-08 NOTE — Telephone Encounter (Signed)
Spoke with pt. States that she is having severe pain in her L lung. This started this morning and has not stopped. While speaking with pt she sounds like she is short of breath and in a lot of pain. Advised her that it would be a good idea if she went ahead to the closest ER. She agreed and verbalized understanding. Nothing further was needed.

## 2014-11-16 ENCOUNTER — Ambulatory Visit: Payer: Medicare Other | Admitting: Pulmonary Disease

## 2014-11-30 ENCOUNTER — Ambulatory Visit: Payer: Medicare Other | Admitting: Gastroenterology

## 2014-12-08 ENCOUNTER — Telehealth: Payer: Self-pay | Admitting: Pulmonary Disease

## 2014-12-08 NOTE — Telephone Encounter (Signed)
Kimberly Kemp  ° ° °

## 2015-01-04 ENCOUNTER — Telehealth: Payer: Self-pay | Admitting: Pulmonary Disease

## 2015-01-04 MED ORDER — BUDESONIDE 0.25 MG/2ML IN SUSP: RESPIRATORY_TRACT | Status: DC

## 2015-01-04 MED ORDER — ALBUTEROL SULFATE HFA 108 (90 BASE) MCG/ACT IN AERS: INHALATION_SPRAY | RESPIRATORY_TRACT | Status: DC

## 2015-01-04 NOTE — Telephone Encounter (Signed)
Spoke with pt. She needed refill on albuterol inhaler and pulmicort neb. I have sent this in. Nothing further needed

## 2015-01-05 ENCOUNTER — Other Ambulatory Visit: Payer: Self-pay | Admitting: Pulmonary Disease

## 2015-01-05 ENCOUNTER — Telehealth: Payer: Self-pay | Admitting: Pulmonary Disease

## 2015-01-05 NOTE — Telephone Encounter (Signed)
Calling stating that wrong med was called in for her and she is wanting the correct one called in it was pulmbicort she can be reached @ [phone]. [facility] uses [facility] [email or web address].Kimberly Kemp

## 2015-01-05 NOTE — Telephone Encounter (Signed)
Attempted to call patient at [phone] but was unable to LVM, due to no VM set up. Attempted to call patient at [phone] but states phone is no longer in service.

## 2015-01-08 NOTE — Telephone Encounter (Signed)
Called and left voicemail message on telephone number provided by patient [phone]) requesting call back.

## 2015-01-09 NOTE — Telephone Encounter (Signed)
ATC [phone] - NA, unable to leave vmail  LMTCB x 1 [id number]) for pt to return call.

## 2015-01-10 NOTE — Telephone Encounter (Signed)
lmtcb X4 for pt.  Will close message per triage protocol.  

## 2015-02-01 ENCOUNTER — Ambulatory Visit: Payer: Medicare Other | Admitting: Gastroenterology

## 2015-03-13 ENCOUNTER — Other Ambulatory Visit: Payer: Self-pay | Admitting: Pulmonary Disease

## 2015-04-03 ENCOUNTER — Telehealth: Payer: Self-pay | Admitting: Pulmonary Disease

## 2015-04-03 NOTE — Telephone Encounter (Signed)
Pt scheduled tomorrow with [facility] for flu swab.  Nothing further needed.

## 2015-04-03 NOTE — Telephone Encounter (Signed)
She needs to come in for flu swab.

## 2015-04-03 NOTE — Telephone Encounter (Signed)
Called spoke with patient who reports that she is unsure if she has the flu with symptoms that include 'severe pain', HA, sore throat, dry cough, wheezing, increased SOB, tightness in chest, fever up to 100.2 x4-5days - has been taking Mucinex DM and Tylenol with no relief.  Denies any mucus production, head congestion, PND.  Last ov with VS was 1.15.16 Pt has upcoming appt scheduled 3.15.17  Allergies  Allergen Reactions  . Penicillins Hives  [facility] [address]  Dr Halford Chessman please advise, thank you.

## 2015-04-04 ENCOUNTER — Telehealth: Payer: Self-pay | Admitting: Pulmonary Disease

## 2015-04-04 ENCOUNTER — Ambulatory Visit (INDEPENDENT_AMBULATORY_CARE_PROVIDER_SITE_OTHER)
Admission: RE | Admit: 2015-04-04 | Discharge: 2015-04-04 | Disposition: A | Discharge status: Home / Self Care | Payer: Medicare Other | Source: Ambulatory Visit | Attending: Pulmonary Disease | Admitting: Pulmonary Disease

## 2015-04-04 ENCOUNTER — Ambulatory Visit (INDEPENDENT_AMBULATORY_CARE_PROVIDER_SITE_OTHER): Payer: Medicare Other | Admitting: Pulmonary Disease

## 2015-04-04 ENCOUNTER — Encounter: Payer: Self-pay | Admitting: Pulmonary Disease

## 2015-04-04 VITALS — BP 168/98 | HR 120 | Temp 98.3°F | Ht 63.0 in | Wt 126.4 lb

## 2015-04-04 DIAGNOSIS — J209 Acute bronchitis, unspecified: Secondary | ICD-10-CM

## 2015-04-04 DIAGNOSIS — J441 Chronic obstructive pulmonary disease with (acute) exacerbation: Secondary | ICD-10-CM

## 2015-04-04 DIAGNOSIS — R05 Cough: Secondary | ICD-10-CM | POA: Diagnosis not present

## 2015-04-04 DIAGNOSIS — R509 Fever, unspecified: Secondary | ICD-10-CM | POA: Diagnosis not present

## 2015-04-04 LAB — POCT INFLUENZA A/B
INFLUENZA B, POC: NEGATIVE
Influenza A, POC: NEGATIVE

## 2015-04-04 MED ORDER — DOXYCYCLINE HYCLATE 100 MG PO TABS: 100.0000 mg | ORAL_TABLET | Freq: Two times a day (BID) | ORAL | Status: DC

## 2015-04-04 MED ORDER — BENZONATATE 100 MG PO CAPS: 100.0000 mg | ORAL_CAPSULE | Freq: Three times a day (TID) | ORAL | Status: DC | PRN

## 2015-04-04 MED ORDER — HYDROCOD POLST-CPM POLST ER 10-8 MG/5ML PO SUER: 5.0000 mL | Freq: Every evening | ORAL | Status: DC | PRN

## 2015-04-04 MED ORDER — PREDNISONE 10 MG PO TABS: ORAL_TABLET | ORAL | Status: DC

## 2015-04-04 NOTE — Progress Notes (Signed)
Subjective:    Patient ID: Kimberly Kemp, female    DOB: 03-06-1965, 50 y.o.   MRN: [id number]  [facility]  Acute visit for cough with known COPD & Emphysema.  HPI Patient reports starting 7 days ago she began with a headache and sore throat. She reports her cough is worsening and is nonproductive. Denies any sinus congestion or pressure. She reports subjective fever, chills, and sweats. She reports myalgias. She is waking up at night unable to breathe. She reports she is compliant with her Brovana and Budesonide. She has been using her prn albuterol every 4 hours.  She has been taking Mucinex DM & Tylenol OTC to help. She reports multiple sick contacts at home. No recent travel. Did not get the influenza vaccine. Hasn't been able to smoke recently with her illness. She reports no recent antibiotics.   Review of Systems She has had some nausea as well as diarrhea. No rashes or bruising. She reports diffuse chest wall pain from her coughing.  Allergies  Allergen Reactions  . Penicillins Hives    Current Outpatient Prescriptions on File Prior to Visit  Medication Sig Dispense Refill  . albuterol (PROAIR HFA) 108 (90 BASE) MCG/ACT inhaler INHALE 2 PUFFS INTO THE LUNGS EVERY FOUR HOURS AS NEEDED FOR WHEEZING 8.5 each 2  . arformoterol (BROVANA) 15 MCG/2ML NEBU Take 2 mLs (15 mcg total) by nebulization 2 (two) times daily. Dx 492.8 120 mL 6  . aspirin-acetaminophen-caffeine (EXCEDRIN MIGRAINE) [id number] MG per tablet Take 2 tablets by mouth every 6 (six) hours as needed for headache.    . benzonatate (TESSALON) 200 MG capsule Take 1 capsule (200 mg total) by mouth 3 (three) times daily as needed for cough. 30 capsule 0  . budesonide (PULMICORT) 0.25 MG/2ML nebulizer solution USE 1 VIAL PER NEBULIZER TWICE A DAY 120 mL 2  . Chlorpheniramine Maleate 2 MG/ML LIQD Take 2 mLs (4 mg total) by mouth every 6 (six) hours as needed.    Marland Kitchen dextromethorphan-guaiFENesin (MUCINEX DM) 30-600 MG per 12 hr tablet Take  1 tablet by mouth every 12 (twelve) hours.      Marland Kitchen ipratropium (ATROVENT) 0.02 % nebulizer solution USE 1.25 ML EVERY 4 HOURS AS NEEDED FOR WHEEZING.Marland KitchenMarland KitchenDX 492.8 225 mL 4  . omeprazole (PRILOSEC) 40 MG capsule Take 1 capsule (40 mg total) by mouth 2 (two) times daily. 60 capsule 2  . zolpidem (AMBIEN) 5 MG tablet Take 5 mg by mouth at bedtime as needed for sleep.      No current facility-administered medications on file prior to visit.    Past Medical History  Diagnosis Date  . Hypertension   . Emphysema   . GERD (gastroesophageal reflux disease)   . Chronic pain   . Tobacco abuse   . Anxiety   . Paroxysmal atrial fibrillation (HCC)   . Cirrhosis ([place])   . COPD (chronic obstructive pulmonary disease) ([place])   . Asthma   . Depression   . Pulmonary embolism ([place])   . Hepatitis B   . Stroke [facility])     Past Surgical History  Procedure Laterality Date  . Bronchoscopy  March 2008, Feb 2012  . Cesarean section    . Appendectomy      microscopic     Family History  Problem Relation Age of Onset  . Emphysema Father   . Prostate cancer Father   . Liver disease Father   . Emphysema Maternal Grandmother   . Diabetes Mother   . Heart  disease Mother   . Colon cancer Neg Hx     Social History   Social History  . Marital Status: Married    Spouse Name: N/A  . Number of Children: 4  . Years of Education: N/A   Occupational History  .     Social History Main Topics  . Smoking status: Current Every Day Smoker -- 1.50 packs/day for 36 years    Types: Cigarettes  . Smokeless tobacco: Never Used     Comment: smokes 7-8 cigs daily 04/04/15  . Alcohol Use: No  . Drug Use: No  . Sexual Activity: Not Asked   Other Topics Concern  . None   Social History Narrative      Objective:   Physical Exam BP 170/98 mmHg  Pulse 120  Ht 5\' 3"  (1.6 m)  Wt 126 lb 6.4 oz (57.335 kg)  BMI 22.40 kg/m2  SpO2 100% General:  Awake. Alert. Appears uncomfortable.  Integument:  Warm & dry. No  rash on exposed skin. No bruising. HEENT:  Moist mucus membranes. No oral ulcers. No scleral injection or icterus.  Cardiovascular:  Regular rate. No edema. No appreciable JVD.  Pulmonary:  Good aeration & clear to auscultation bilaterally. Symmetric chest wall expansion. No accessory muscle use on room air. Abdomen: Soft. Normal bowel sounds. Nondistended. . Musculoskeletal:  Normal bulk and tone. No joint deformity or effusion appreciated.  PFT 01/05/13:  FVC 3.74 L (115%) FEV1 2.58 L (96%) FEV1/FVC 0.69 FEF 25-75 1.82 L (61%)  LABS 04/04/15 Influenza DFA:  Negative  8/1/8 ABG on 3 L/m:  7.45 / 26 / 132 / 99%    Assessment & Plan:  50 year old female with known history of mild COPD and emphysema based on prior spirometry from 2014 on my review. Patient appears to have an upper respiratory infection/acute bronchitis. She does have good air movement and therefore I feel pneumonia is somewhat less likely. Given the patient's infectious symptoms and persistent cough I am starting her on antibiotic therapy for both superinfection as well as anti-inflammatory effect. We did discuss admission to Hospital for further treatment for the patient is unwilling to go at this time. I did caution the patient to seek any medical attention if she had any clinical worsening.  1. Acute bronchitis: Checking chest x-ray PA/LAT to evaluate for possible pneumonia. Empiric treatment with doxycycline. Suspect a viral prodrome. 2. COPD exacerbation: Starting the patient on a prednisone taper. Instructed to continue to utilize her albuterol via nebulizer every 4-6 hours during her acute illness. Starting [employer] & Tussionex cough syrup for cough suppression. Patient will continue using Symbicort & Spiriva. 3. Health maintenance: Patient did not have the influenza vaccine this year. Pneumovax was in 2005. 4. Follow-up: Patient will keep her appointment with Dr. Halford Chessman on 3/15.  Sonia Baller Ashok Cordia, M.D. [facility]  Pulmonary & Critical Care Pager:  [phone] After 3pm or if no response, call [phone] 2:37 PM 04/04/2015

## 2015-04-04 NOTE — Telephone Encounter (Signed)
Result Notes     Notes Recorded by Inge Rise, CMA on 04/04/2015 at 3:27 PM LMTCB x1 ------  Notes Recorded by Javier Glazier, MD on 04/04/2015 at 3:08 PM Please call the patient and let her know that I have reviewed her chest x-ray. There is no evidence of pneumonia. I suspect all of her symptoms are from an acute bronchitis and COPD exacerbation. Thank you   Pt is aware of results. Nothing further was needed.

## 2015-04-04 NOTE — Patient Instructions (Signed)
   Seek immediate medical attention at the local emergency room if you get any worse  Remember to take the antibiotic (doxycycline) with a full glass of water remaining upright for 1 hour after taking the medication. Also remember to avoid taking the antibiotic with any dairy products as they will make it less effective.  I am starting you on a prednisone taper.  Try using the [facility] for your cough as these will not sedate 2. If your cough continues and continues to disrupt your sleep you can try Tussionex cough syrup but this can sedate you.  We will keep your appointment with Dr. Halford Chessman on 3/15 as scheduled.  TESTS ORDERED: 1. Chest x-ray PA/LAT today

## 2015-04-09 ENCOUNTER — Other Ambulatory Visit: Payer: Self-pay | Admitting: Pulmonary Disease

## 2015-04-18 ENCOUNTER — Ambulatory Visit: Payer: Medicare Other | Admitting: Pulmonary Disease

## 2015-06-02 ENCOUNTER — Other Ambulatory Visit: Payer: Self-pay | Admitting: Pulmonary Disease

## 2015-07-03 ENCOUNTER — Other Ambulatory Visit: Payer: Self-pay | Admitting: Pulmonary Disease

## 2015-07-16 ENCOUNTER — Ambulatory Visit (INDEPENDENT_AMBULATORY_CARE_PROVIDER_SITE_OTHER): Payer: Medicare Other

## 2015-07-16 ENCOUNTER — Encounter (HOSPITAL_COMMUNITY): Payer: Self-pay | Admitting: *Deleted

## 2015-07-16 ENCOUNTER — Ambulatory Visit (HOSPITAL_COMMUNITY)
Admission: EM | Admit: 2015-07-16 | Discharge: 2015-07-16 | Disposition: A | Discharge status: Home / Self Care | Payer: Medicare Other | Attending: Family Medicine | Admitting: Family Medicine

## 2015-07-16 DIAGNOSIS — M25551 Pain in right hip: Secondary | ICD-10-CM | POA: Diagnosis not present

## 2015-07-16 DIAGNOSIS — S76911A Strain of unspecified muscles, fascia and tendons at thigh level, right thigh, initial encounter: Secondary | ICD-10-CM | POA: Diagnosis not present

## 2015-07-16 DIAGNOSIS — M25559 Pain in unspecified hip: Secondary | ICD-10-CM | POA: Diagnosis not present

## 2015-07-16 MED ORDER — TRAMADOL HCL 50 MG PO TABS: 50.0000 mg | ORAL_TABLET | Freq: Four times a day (QID) | ORAL | Status: DC | PRN

## 2015-07-16 MED ORDER — DICLOFENAC POTASSIUM 50 MG PO TABS: 50.0000 mg | ORAL_TABLET | Freq: Three times a day (TID) | ORAL | Status: DC

## 2015-07-16 NOTE — ED Notes (Signed)
Pt  Reports   Pain  r    Hip        X  1  Month      Pt  Ambulated  To  Room  With   A  Steady  Fluid  Gait          pt  denys  Any  specefic  Injury        Pt reports   The  Symptoms  Not  releived  By  [facility]  meds

## 2015-07-16 NOTE — ED Provider Notes (Signed)
CSN: [id number]     Arrival date & time 07/16/15  1610 History   First MD Initiated Contact with Patient 07/16/15 1802     Chief Complaint  Patient presents with  . Hip Pain   (Consider location/radiation/quality/duration/timing/severity/associated sxs/prior Treatment) HPI Comments: 50 year old female complaining of pain in the right hip for 4 weeks. Denies any known injury, fall or event to cause the pain. Gradual onset. Pain Increasing in intensity and function of the right hip decreasing. There is pain with movement of the leg and weightbearing and ambulation. Sometimes there is numbness and tingling that runs from the right hip to the foot.   Past Medical History  Diagnosis Date  . Hypertension   . Emphysema   . GERD (gastroesophageal reflux disease)   . Chronic pain   . Tobacco abuse   . Anxiety   . Paroxysmal atrial fibrillation (HCC)   . Cirrhosis ([place])   . COPD (chronic obstructive pulmonary disease) ([place])   . Asthma   . Depression   . Pulmonary embolism ([place])   . Hepatitis B   . Stroke [facility])    Past Surgical History  Procedure Laterality Date  . Bronchoscopy  March 2008, Feb 2012  . Cesarean section    . Appendectomy      microscopic    Family History  Problem Relation Age of Onset  . Emphysema Father   . Prostate cancer Father   . Liver disease Father   . Emphysema Maternal Grandmother   . Diabetes Mother   . Heart disease Mother   . Colon cancer Neg Hx    Social History  Substance Use Topics  . Smoking status: Current Every Day Smoker -- 1.50 packs/day for 36 years    Types: Cigarettes  . Smokeless tobacco: Never Used     Comment: smokes 7-8 cigs daily 04/04/15  . Alcohol Use: No   OB History    No data available     Review of Systems  Constitutional: Positive for activity change. Negative for fever and chills.  HENT: Negative.   Respiratory: Negative.   Cardiovascular: Negative.   Musculoskeletal: Positive for myalgias and arthralgias. Negative  for back pain, joint swelling, neck pain and neck stiffness.       As per HPI  Skin: Negative for color change, pallor and rash.  Neurological: Positive for numbness. Negative for speech difficulty.  All other systems reviewed and are negative.   Allergies  Penicillins  Home Medications   Prior to Admission medications   Medication Sig Start Date End Date Taking? Authorizing Provider  albuterol (PROAIR HFA) 108 (90 BASE) MCG/ACT inhaler INHALE 2 PUFFS INTO THE LUNGS EVERY FOUR HOURS AS NEEDED FOR WHEEZING 01/04/15   Chesley Mires, MD  arformoterol (BROVANA) 15 MCG/2ML NEBU Take 2 mLs (15 mcg total) by nebulization 2 (two) times daily. Dx 492.8 10/03/14   Chesley Mires, MD  aspirin-acetaminophen-caffeine (EXCEDRIN MIGRAINE) [phone] MG per tablet Take 2 tablets by mouth every 6 (six) hours as needed for headache.    Historical Provider, MD  benzonatate (TESSALON) 100 MG capsule Take 1-2 capsules (100-200 mg total) by mouth 3 (three) times daily as needed for cough. 04/04/15   Javier Glazier, MD  benzonatate (TESSALON) 200 MG capsule Take 1 capsule (200 mg total) by mouth 3 (three) times daily as needed for cough. 10/27/14   Daleen Bo, MD  budesonide (PULMICORT) 0.25 MG/2ML nebulizer solution USE 1 VIAL PER NEBULIZER TWICE A DAY 01/04/15   Chesley Mires,  MD  Chlorpheniramine Maleate 2 MG/ML LIQD Take 2 mLs (4 mg total) by mouth every 6 (six) hours as needed. 02/17/14   Chesley Mires, MD  chlorpheniramine-HYDROcodone (TUSSIONEX PENNKINETIC ER) 10-8 MG/5ML SUER Take 5 mLs by mouth at bedtime as needed for cough. 04/04/15   Javier Glazier, MD  dextromethorphan-guaiFENesin [facility] DM) 30-600 MG per 12 hr tablet Take 1 tablet by mouth every 12 (twelve) hours.      Historical Provider, MD  diclofenac (CATAFLAM) 50 MG tablet Take 1 tablet (50 mg total) by mouth 3 (three) times daily. One tablet TID with food prn pain. 07/16/15   Janne Napoleon, NP  doxycycline (VIBRA-TABS) 100 MG tablet Take 1 tablet (100 mg  total) by mouth 2 (two) times daily. 04/04/15   Javier Glazier, MD  ipratropium (ATROVENT) 0.02 % nebulizer solution USE 1.25 ML (1/2 VIAL) EVERY 4 HOURS AS NEEDED FOR WHEEZING.Marland KitchenMarland KitchenDX 492.8 04/11/15   Chesley Mires, MD  omeprazole (PRILOSEC) 40 MG capsule TAKE ONE CAPSULE BY MOUTH TWICE A DAY 06/04/15   Chesley Mires, MD  predniSONE (DELTASONE) 10 MG tablet Take 60 mg daily x 3 days, 40 mg daily x 3 days, 20 mg daily x 3 days, 10 mg daily x  3 days then stop 04/04/15   Javier Glazier, MD  traMADol (ULTRAM) 50 MG tablet Take 1 tablet (50 mg total) by mouth every 6 (six) hours as needed. 07/16/15   Janne Napoleon, NP  zolpidem (AMBIEN) 5 MG tablet Take 5 mg by mouth at bedtime as needed for sleep.     Historical Provider, MD   Meds Ordered and Administered this Visit  Medications - No data to display  BP 168/97 mmHg  Pulse 104  Temp(Src) 98.5 F (36.9 C) (Oral)  SpO2 100%  LMP 07/15/2015 No data found.   Physical Exam  Constitutional: She is oriented to person, place, and time. She appears well-developed and well-nourished. No distress.  HENT:  Head: Normocephalic and atraumatic.  Eyes: EOM are normal. Pupils are equal, round, and reactive to light.  Neck: Normal range of motion. Neck supple.  Cardiovascular: Normal rate.   Pulmonary/Chest: Effort normal.  Musculoskeletal: She exhibits tenderness. She exhibits no edema.  Inspection of the right hip and pelvis reveals no asymmetry. No swelling or discoloration. No tenderness over the iliac crest. There is direct tenderness over the proximal anterior quadricep muscle as well as the lateral aspect of the proximal thigh and over the greater trochanter and vastus lateralis. Portion of the lateral aspect of the gluteus medius is tender. Patient is able to perform a straight leg raise while supine abduct and adduct the leg with short movements. All of this does cause significant pain. Passive range of motion allows for internal and external rotation of the hip  but with pain. Pedal pulse 2+. Distal neurovascular intact. Sensory intact.  Neurological: She is alert and oriented to person, place, and time. No cranial nerve deficit.  Skin: Skin is warm and dry.  Psychiatric: She has a normal mood and affect.  Nursing note and vitals reviewed.   ED Course  Procedures (including critical care time)  Labs Review Labs Reviewed - No data to display  Imaging Review Dg Hip Unilat With Pelvis 2-3 Views Right  07/16/2015  CLINICAL DATA:  Pt here with rt hip pain x 1 month, pt does not know of any injury, though she is packing and moving over the last month, pain when walking, pt states it just throbbs EXAM:  DG HIP (WITH OR WITHOUT PELVIS) 2-3V RIGHT COMPARISON:  None. FINDINGS: No fracture. Hip joints normally spaced and aligned. No significant arthropathic change. Small bone island noted in the subchondral superior medial right acetabulum. No other bone lesion. Surgical vascular clips in the right upper pelvis. Soft tissues otherwise unremarkable. IMPRESSION: No fracture or joint abnormality. Electronically Signed   By: Lajean Manes M.D.   On: 07/16/2015 19:31     Visual Acuity Review  Right Eye Distance:   Left Eye Distance:   Bilateral Distance:    Right Eye Near:   Left Eye Near:    Bilateral Near:         MDM   1. Muscle strain of thigh, right, initial encounter   2. Hip pain, acute    Meds ordered this encounter  Medications  . diclofenac (CATAFLAM) 50 MG tablet    Sig: Take 1 tablet (50 mg total) by mouth 3 (three) times daily. One tablet TID with food prn pain.    Dispense:  21 tablet    Refill:  0    Order Specific Question:  Supervising Provider    Answer:  Billy Fischer [phone]  . traMADol (ULTRAM) 50 MG tablet    Sig: Take 1 tablet (50 mg total) by mouth every 6 (six) hours as needed.    Dispense:  15 tablet    Refill:  0    Order Specific Question:  Supervising Provider    Answer:  Ihor Gully D [id number]   Heat to sore  muscle areas. Limit activity that exacerbates pain. At the time of the discharge the patient asked if the pain might be due to all of the moving boxes and lifting and carrying other objects and performing physical activity over the past month that she is not used to can be causing her hip pain. I replied that this certainly may be the calls. She was unable to recall any activity that would have contributed to her having hip pain at the outset.  Janne Napoleon, NP 07/16/15 2006

## 2015-07-16 NOTE — Discharge Instructions (Signed)
Muscle Strain A muscle strain is an injury that occurs when a muscle is stretched beyond its normal length. Usually a small number of muscle fibers are torn when this happens. Muscle strain is rated in degrees. First-degree strains have the least amount of muscle fiber tearing and pain. Second-degree and third-degree strains have increasingly more tearing and pain.  Usually, recovery from muscle strain takes 1-2 weeks. Complete healing takes 5-6 weeks.  CAUSES  Muscle strain happens when a sudden, violent force placed on a muscle stretches it too far. This may occur with lifting, sports, or a fall.  RISK FACTORS Muscle strain is especially common in athletes.  SIGNS AND SYMPTOMS At the site of the muscle strain, there may be:  Pain.  Bruising.  Swelling.  Difficulty using the muscle due to pain or lack of normal function. DIAGNOSIS  Your health care provider will perform a physical exam and ask about your medical history. TREATMENT  Often, the best treatment for a muscle strain is resting, icing, and applying cold compresses to the injured area.  HOME CARE INSTRUCTIONS   Use the PRICE method of treatment to promote muscle healing during the first 2-3 days after your injury. The PRICE method involves:  Protecting the muscle from being injured again.  Restricting your activity and resting the injured body part.  Icing your injury. To do this, put ice in a plastic bag. Place a towel between your skin and the bag. Then, apply the ice and leave it on from 15-20 minutes each hour. After the third day, switch to moist heat packs.  Apply compression to the injured area with a splint or elastic bandage. Be careful not to wrap it too tightly. This may interfere with blood circulation or increase swelling.  Elevate the injured body part above the level of your heart as often as you can.  Only take over-the-counter or prescription medicines for pain, discomfort, or fever as directed by your  health care provider.  Warming up prior to exercise helps to prevent future muscle strains. SEEK MEDICAL CARE IF:   You have increasing pain or swelling in the injured area.  You have numbness, tingling, or a significant loss of strength in the injured area. MAKE SURE YOU:   Understand these instructions.  Will watch your condition.  Will get help right away if you are not doing well or get worse.   This information is not intended to replace advice given to you by your health care provider. Make sure you discuss any questions you have with your health care provider.   Document Released: 01/20/2005 Document Revised: 11/10/2012 Document Reviewed: 08/19/2012 Elsevier Interactive Patient Education 2016 [employer]  Hip Pain Your hip is the joint between your upper legs and your lower pelvis. The bones, cartilage, tendons, and muscles of your hip joint perform a lot of work each day supporting your body weight and allowing you to move around. Hip pain can range from a minor ache to severe pain in one or both of your hips. Pain may be felt on the inside of the hip joint near the groin, or the outside near the buttocks and upper thigh. You may have swelling or stiffness as well.  HOME CARE INSTRUCTIONS   Take medicines only as directed by your health care provider.  Apply ice to the injured area:  Put ice in a plastic bag.  Place a towel between your skin and the bag.  Leave the ice on for 15-20 minutes at  a time, 3-4 times a day.  Keep your leg raised (elevated) when possible to lessen swelling.  Avoid activities that cause pain.  Follow specific exercises as directed by your health care provider.  Sleep with a pillow between your legs on your most comfortable side.  Record how often you have hip pain, the location of the pain, and what it feels like. SEEK MEDICAL CARE IF:   You are unable to put weight on your leg.  Your hip is red or swollen or very tender to  touch.  Your pain or swelling continues or worsens after 1 week.  You have increasing difficulty walking.  You have a fever. SEEK IMMEDIATE MEDICAL CARE IF:   You have fallen.  You have a sudden increase in pain and swelling in your hip. MAKE SURE YOU:   Understand these instructions.  Will watch your condition.  Will get help right away if you are not doing well or get worse.   This information is not intended to replace advice given to you by your health care provider. Make sure you discuss any questions you have with your health care provider.   Document Released: 07/10/2009 Document Revised: 02/10/2014 Document Reviewed: 09/16/2012 Elsevier Interactive Patient Education 2016 Elsevier Inc.  Musculoskeletal Pain Musculoskeletal pain is muscle and boney aches and pains. These pains can occur in any part of the body. Your caregiver may treat you without knowing the cause of the pain. They may treat you if blood or urine tests, X-rays, and other tests were normal.  CAUSES There is often not a definite cause or reason for these pains. These pains may be caused by a type of germ (virus). The discomfort may also come from overuse. Overuse includes working out too hard when your body is not fit. Boney aches also come from weather changes. Bone is sensitive to atmospheric pressure changes. HOME CARE INSTRUCTIONS   Ask when your test results will be ready. Make sure you get your test results.  Only take over-the-counter or prescription medicines for pain, discomfort, or fever as directed by your caregiver. If you were given medications for your condition, do not drive, operate machinery or power tools, or sign legal documents for 24 hours. Do not drink alcohol. Do not take sleeping pills or other medications that may interfere with treatment.  Continue all activities unless the activities cause more pain. When the pain lessens, slowly resume normal activities. Gradually increase the  intensity and duration of the activities or exercise.  During periods of severe pain, bed rest may be helpful. Lay or sit in any position that is comfortable.  Putting ice on the injured area.  Put ice in a bag.  Place a towel between your skin and the bag.  Leave the ice on for 15 to 20 minutes, 3 to 4 times a day.  Follow up with your caregiver for continued problems and no reason can be found for the pain. If the pain becomes worse or does not go away, it may be necessary to repeat tests or do additional testing. Your caregiver may need to look further for a possible cause. SEEK IMMEDIATE MEDICAL CARE IF:  You have pain that is getting worse and is not relieved by medications.  You develop chest pain that is associated with shortness or breath, sweating, feeling sick to your stomach (nauseous), or throw up (vomit).  Your pain becomes localized to the abdomen.  You develop any new symptoms that seem different or that concern  you. MAKE SURE YOU:   Understand these instructions.  Will watch your condition.  Will get help right away if you are not doing well or get worse.   This information is not intended to replace advice given to you by your health care provider. Make sure you discuss any questions you have with your health care provider.   Document Released: 01/20/2005 Document Revised: 04/14/2011 Document Reviewed: 09/24/2012 Elsevier Interactive Patient Education [employer].

## 2015-08-06 ENCOUNTER — Telehealth: Payer: Self-pay | Admitting: Pulmonary Disease

## 2015-08-06 ENCOUNTER — Other Ambulatory Visit: Payer: Self-pay | Admitting: Pulmonary Disease

## 2015-08-06 MED ORDER — BENZONATATE 100 MG PO CAPS: 100.0000 mg | ORAL_CAPSULE | Freq: Three times a day (TID) | ORAL | Status: DC | PRN

## 2015-08-06 NOTE — Telephone Encounter (Signed)
Per [facility] pt has no more refills on Tessalon Perles.   Spoke with pt and she states that she has continued dry cough, SOB and wheeze. Pt has been taking Mucinex with no relief. Pt has been using albuterol HFA with some relief. Pt would like refill on Tessalon.   VS is not in office this week. OK Per TP for 1 fill.   Rx sent.  Pt notified and advised that if cough continues she needs to come in for OV. Pt voiced understanding. Nothing further needed.

## 2015-08-09 ENCOUNTER — Other Ambulatory Visit: Payer: Self-pay | Admitting: Pulmonary Disease

## 2015-09-03 ENCOUNTER — Telehealth: Payer: Self-pay | Admitting: Pulmonary Disease

## 2015-09-03 NOTE — Telephone Encounter (Signed)
Spoke with the pt  She c/o increased cough for several days, poor appetite and increased SOB  She states "my lungs hurt"  OV with RA for tomorrow and advised ED sooner if worse

## 2015-09-04 ENCOUNTER — Encounter: Payer: Self-pay | Admitting: Pulmonary Disease

## 2015-09-04 ENCOUNTER — Ambulatory Visit (INDEPENDENT_AMBULATORY_CARE_PROVIDER_SITE_OTHER)
Admission: RE | Admit: 2015-09-04 | Discharge: 2015-09-04 | Disposition: A | Discharge status: Home / Self Care | Payer: Medicare Other | Source: Ambulatory Visit | Attending: Pulmonary Disease | Admitting: Pulmonary Disease

## 2015-09-04 ENCOUNTER — Ambulatory Visit (INDEPENDENT_AMBULATORY_CARE_PROVIDER_SITE_OTHER): Payer: Medicare Other | Admitting: Pulmonary Disease

## 2015-09-04 DIAGNOSIS — J449 Chronic obstructive pulmonary disease, unspecified: Secondary | ICD-10-CM | POA: Insufficient documentation

## 2015-09-04 DIAGNOSIS — J441 Chronic obstructive pulmonary disease with (acute) exacerbation: Secondary | ICD-10-CM | POA: Insufficient documentation

## 2015-09-04 DIAGNOSIS — R079 Chest pain, unspecified: Secondary | ICD-10-CM | POA: Diagnosis not present

## 2015-09-04 DIAGNOSIS — R053 Chronic cough: Secondary | ICD-10-CM

## 2015-09-04 DIAGNOSIS — R05 Cough: Secondary | ICD-10-CM | POA: Diagnosis not present

## 2015-09-04 MED ORDER — PREDNISONE 10 MG PO TABS: ORAL_TABLET | ORAL | 0 refills | Status: DC

## 2015-09-04 NOTE — Progress Notes (Signed)
   Subjective:    Patient ID: Kimberly Kemp, female    DOB: Jan 07, 1966, 50 y.o.   MRN: [id number]  HPI   49 year old smoker with mild COPD and chronic cough -Stage I with low ratio but normal FEV1  Chief Complaint  Patient presents with  . Acute Visit    dry, hacking cough, sore in ribs from coughing so hard.     She reports a chronic cough for many years Acute worsening over the last few weeks Started using Tessalon Perles about 4 weeks ago and is to take these frequently to keep the cough in check. Cough is mostly dry and to the point where her ribs are hurting. No fevers or wheezing or nocturnal symptoms. No preceding URI or sick contacts She continues to smoke about a pack per day  In the past, antibiotics and steroids have helped  PFT 01/05/13:  FVC 3.74 L (115%) FEV1 2.58 L (96%) FEV1/FVC 0.69 FEF 25-75 1.82 L (61%)   Review of Systems neg for any significant sore throat, dysphagia, itching, sneezing, nasal congestion or excess/ purulent secretions, fever, chills, sweats, unintended wt loss, pleuritic or exertional cp, hempoptysis, orthopnea pnd or change in chronic leg swelling.  Also denies presyncope, palpitations, heartburn, abdominal pain, nausea, vomiting, diarrhea or change in bowel or urinary habits, dysuria,hematuria, rash, arthralgias, visual complaints, headache, numbness weakness or ataxia.     Objective:   Physical Exam  Gen. Pleasant, well-nourished, in no distress ENT - no lesions, no post nasal drip Neck: No JVD, no thyromegaly, no carotid bruits Lungs: no use of accessory muscles, no dullness to percussion, clear without rales or rhonchi  Cardiovascular: Rhythm regular, heart sounds  normal, no murmurs or gallops, no peripheral edema Musculoskeletal: No deformities, no cyanosis or clubbing         Assessment & Plan:

## 2015-09-04 NOTE — Patient Instructions (Signed)
Prednisone 10 mg tablets -Take 4 tabs  daily with food x 3 days, then 3 tabs daily x 3 days, then 2 tabs daily x 3 days, then 1 tab daily x3 days then stop. #30  Chest x-ray today Use Delsym cough syrup 5 ML thrice daily as needed  Stay on Pulmicort and Brovana twice daily

## 2015-09-04 NOTE — Assessment & Plan Note (Signed)
We'll treat as COPD exacerbation  Prednisone 10 mg tablets -Take 4 tabs  daily with food x 3 days, then 3 tabs daily x 3 days, then 2 tabs daily x 3 days, then 1 tab daily x3 days then stop. #30  Chest x-ray today  Stay on Pulmicort and Brovana twice daily

## 2015-09-04 NOTE — Assessment & Plan Note (Signed)
Use Delsym cough syrup 5 ML thrice daily as needed Try to avoid narcotics here Stay on omeprazole for GERD Smoking cessation again emphasized

## 2015-09-06 ENCOUNTER — Other Ambulatory Visit: Payer: Self-pay | Admitting: Pulmonary Disease

## 2015-09-10 ENCOUNTER — Telehealth: Payer: Self-pay | Admitting: Pulmonary Disease

## 2015-09-10 MED ORDER — OMEPRAZOLE 40 MG PO CPDR: 40.0000 mg | DELAYED_RELEASE_CAPSULE | Freq: Every day | ORAL | 1 refills | Status: DC

## 2015-09-10 NOTE — Telephone Encounter (Signed)
Called and spoke to pt. Pt is requesting a refill on Omeprazole. Rx sent to preferred pharmacy. Pt has appt with VS on 09/14/15, pt aware to keep appt. Nothing further needed at this time.

## 2015-09-14 ENCOUNTER — Ambulatory Visit: Payer: Medicare Other | Admitting: Pulmonary Disease

## 2015-09-22 ENCOUNTER — Emergency Department (HOSPITAL_COMMUNITY): Payer: Medicare Other

## 2015-09-22 ENCOUNTER — Inpatient Hospital Stay (HOSPITAL_COMMUNITY)
Admission: EM | Admit: 2015-09-22 | Discharge: 2015-09-23 | DRG: 190 | Disposition: A | Discharge status: Home / Self Care | Payer: Medicare Other | Attending: Oncology | Admitting: Oncology

## 2015-09-22 ENCOUNTER — Encounter (HOSPITAL_COMMUNITY): Payer: Self-pay | Admitting: Emergency Medicine

## 2015-09-22 DIAGNOSIS — Z9114 Patient's other noncompliance with medication regimen: Secondary | ICD-10-CM

## 2015-09-22 DIAGNOSIS — K7460 Unspecified cirrhosis of liver: Secondary | ICD-10-CM | POA: Diagnosis present

## 2015-09-22 DIAGNOSIS — J441 Chronic obstructive pulmonary disease with (acute) exacerbation: Secondary | ICD-10-CM | POA: Diagnosis not present

## 2015-09-22 DIAGNOSIS — R072 Precordial pain: Secondary | ICD-10-CM | POA: Diagnosis present

## 2015-09-22 DIAGNOSIS — J9600 Acute respiratory failure, unspecified whether with hypoxia or hypercapnia: Secondary | ICD-10-CM

## 2015-09-22 DIAGNOSIS — R069 Unspecified abnormalities of breathing: Secondary | ICD-10-CM | POA: Diagnosis not present

## 2015-09-22 DIAGNOSIS — F17210 Nicotine dependence, cigarettes, uncomplicated: Secondary | ICD-10-CM | POA: Diagnosis present

## 2015-09-22 DIAGNOSIS — G8929 Other chronic pain: Secondary | ICD-10-CM | POA: Diagnosis present

## 2015-09-22 DIAGNOSIS — Z7951 Long term (current) use of inhaled steroids: Secondary | ICD-10-CM | POA: Diagnosis not present

## 2015-09-22 DIAGNOSIS — D72829 Elevated white blood cell count, unspecified: Secondary | ICD-10-CM

## 2015-09-22 DIAGNOSIS — K219 Gastro-esophageal reflux disease without esophagitis: Secondary | ICD-10-CM

## 2015-09-22 DIAGNOSIS — Z8249 Family history of ischemic heart disease and other diseases of the circulatory system: Secondary | ICD-10-CM

## 2015-09-22 DIAGNOSIS — F329 Major depressive disorder, single episode, unspecified: Secondary | ICD-10-CM | POA: Diagnosis present

## 2015-09-22 DIAGNOSIS — R9431 Abnormal electrocardiogram [ECG] [EKG]: Secondary | ICD-10-CM

## 2015-09-22 DIAGNOSIS — I10 Essential (primary) hypertension: Secondary | ICD-10-CM | POA: Diagnosis not present

## 2015-09-22 DIAGNOSIS — T380X5A Adverse effect of glucocorticoids and synthetic analogues, initial encounter: Secondary | ICD-10-CM | POA: Diagnosis present

## 2015-09-22 DIAGNOSIS — I480 Paroxysmal atrial fibrillation: Secondary | ICD-10-CM | POA: Diagnosis not present

## 2015-09-22 DIAGNOSIS — E876 Hypokalemia: Secondary | ICD-10-CM | POA: Diagnosis not present

## 2015-09-22 DIAGNOSIS — R079 Chest pain, unspecified: Secondary | ICD-10-CM

## 2015-09-22 DIAGNOSIS — E782 Mixed hyperlipidemia: Secondary | ICD-10-CM | POA: Diagnosis not present

## 2015-09-22 DIAGNOSIS — Z8673 Personal history of transient ischemic attack (TIA), and cerebral infarction without residual deficits: Secondary | ICD-10-CM

## 2015-09-22 DIAGNOSIS — R0602 Shortness of breath: Secondary | ICD-10-CM | POA: Diagnosis not present

## 2015-09-22 DIAGNOSIS — E785 Hyperlipidemia, unspecified: Secondary | ICD-10-CM

## 2015-09-22 DIAGNOSIS — I4891 Unspecified atrial fibrillation: Secondary | ICD-10-CM | POA: Diagnosis not present

## 2015-09-22 DIAGNOSIS — F419 Anxiety disorder, unspecified: Secondary | ICD-10-CM | POA: Diagnosis present

## 2015-09-22 DIAGNOSIS — Z79899 Other long term (current) drug therapy: Secondary | ICD-10-CM | POA: Diagnosis not present

## 2015-09-22 DIAGNOSIS — Z825 Family history of asthma and other chronic lower respiratory diseases: Secondary | ICD-10-CM | POA: Diagnosis not present

## 2015-09-22 LAB — CBC WITH DIFFERENTIAL/PLATELET
BASOS ABS: 0 10*3/uL (ref 0.0–0.1)
Basophils Absolute: 0 10*3/uL (ref 0.0–0.1)
Basophils Absolute: 0 10*3/uL (ref 0.0–0.1)
Basophils Relative: 0 %
Basophils Relative: 0 %
Basophils Relative: 0 %
EOS ABS: 0 10*3/uL (ref 0.0–0.7)
Eosinophils Absolute: 0 10*3/uL (ref 0.0–0.7)
Eosinophils Absolute: 0 10*3/uL (ref 0.0–0.7)
Eosinophils Relative: 0 %
Eosinophils Relative: 0 %
Eosinophils Relative: 0 %
HCT: 39.3 % (ref 36.0–46.0)
HCT: 38.1 % (ref 36.0–46.0)
HEMATOCRIT: 34.9 % — AB (ref 36.0–46.0)
HEMOGLOBIN: 12.6 g/dL (ref 12.0–15.0)
HEMOGLOBIN: 11.6 g/dL — AB (ref 12.0–15.0)
Hemoglobin: 13.3 g/dL (ref 12.0–15.0)
LYMPHS ABS: 0.7 10*3/uL (ref 0.7–4.0)
LYMPHS ABS: 1.2 10*3/uL (ref 0.7–4.0)
LYMPHS PCT: 6 %
Lymphocytes Relative: 35 %
Lymphocytes Relative: 3 %
Lymphs Abs: 6.2 10*3/uL — ABNORMAL HIGH (ref 0.7–4.0)
MCH: 29 pg (ref 26.0–34.0)
MCH: 28.7 pg (ref 26.0–34.0)
MCH: 28.9 pg (ref 26.0–34.0)
MCHC: 33.8 g/dL (ref 30.0–36.0)
MCHC: 33.1 g/dL (ref 30.0–36.0)
MCHC: 33.2 g/dL (ref 30.0–36.0)
MCV: 85.8 fL (ref 78.0–100.0)
MCV: 86.8 fL (ref 78.0–100.0)
MCV: 86.8 fL (ref 78.0–100.0)
MONO ABS: 1.4 10*3/uL — AB (ref 0.1–1.0)
MONOS PCT: 1 %
Monocytes Absolute: 0.1 10*3/uL (ref 0.1–1.0)
Monocytes Absolute: 0.9 10*3/uL (ref 0.1–1.0)
Monocytes Relative: 8 %
Monocytes Relative: 5 %
NEUTROS ABS: 21.1 10*3/uL — AB (ref 1.7–7.7)
NEUTROS ABS: 17 10*3/uL — AB (ref 1.7–7.7)
NEUTROS PCT: 57 %
NEUTROS PCT: 96 %
NEUTROS PCT: 89 %
Neutro Abs: 10.2 10*3/uL — ABNORMAL HIGH (ref 1.7–7.7)
PLATELETS: 384 10*3/uL (ref 150–400)
PLATELETS: 310 10*3/uL (ref 150–400)
PLATELETS: 308 10*3/uL (ref 150–400)
RBC: 4.58 MIL/uL (ref 3.87–5.11)
RBC: 4.39 MIL/uL (ref 3.87–5.11)
RBC: 4.02 MIL/uL (ref 3.87–5.11)
RDW: 15.1 % (ref 11.5–15.5)
RDW: 15.4 % (ref 11.5–15.5)
RDW: 15.5 % (ref 11.5–15.5)
WBC: 17.8 10*3/uL — AB (ref 4.0–10.5)
WBC: 21.9 10*3/uL — ABNORMAL HIGH (ref 4.0–10.5)
WBC: 19.1 10*3/uL — AB (ref 4.0–10.5)

## 2015-09-22 LAB — BASIC METABOLIC PANEL
ANION GAP: 12 (ref 5–15)
BUN: 16 mg/dL (ref 6–20)
CALCIUM: 9.5 mg/dL (ref 8.9–10.3)
CHLORIDE: 107 mmol/L (ref 101–111)
CO2: 18 mmol/L — ABNORMAL LOW (ref 22–32)
CREATININE: 1.16 mg/dL — AB (ref 0.44–1.00)
GFR calc non Af Amer: 54 mL/min — ABNORMAL LOW (ref >60)
Glucose, Bld: 144 mg/dL — ABNORMAL HIGH (ref 65–99)
Potassium: 3.2 mmol/L — ABNORMAL LOW (ref 3.5–5.1)
SODIUM: 137 mmol/L (ref 135–145)

## 2015-09-22 LAB — I-STAT ARTERIAL BLOOD GAS, ED
Acid-base deficit: 2 mmol/L (ref 0.0–2.0)
Bicarbonate: 20.1 mEq/L (ref 20.0–24.0)
O2 Saturation: 100 %
PCO2 ART: 25.8 mmHg — AB (ref 35.0–45.0)
PH ART: 7.5 — AB (ref 7.350–7.450)
PO2 ART: 277 mmHg — AB (ref 80.0–100.0)
TCO2: 21 mmol/L (ref 0–100)

## 2015-09-22 LAB — COMPREHENSIVE METABOLIC PANEL
ALT: 20 U/L (ref 14–54)
ANION GAP: 10 (ref 5–15)
AST: 29 U/L (ref 15–41)
Albumin: 4 g/dL (ref 3.5–5.0)
Alkaline Phosphatase: 58 U/L (ref 38–126)
BUN: 13 mg/dL (ref 6–20)
CALCIUM: 9 mg/dL (ref 8.9–10.3)
CHLORIDE: 106 mmol/L (ref 101–111)
CO2: 21 mmol/L — AB (ref 22–32)
Creatinine, Ser: 1.1 mg/dL — ABNORMAL HIGH (ref 0.44–1.00)
GFR calc non Af Amer: 58 mL/min — ABNORMAL LOW (ref >60)
Glucose, Bld: 129 mg/dL — ABNORMAL HIGH (ref 65–99)
Potassium: 4.5 mmol/L (ref 3.5–5.1)
SODIUM: 137 mmol/L (ref 135–145)
Total Bilirubin: 0.6 mg/dL (ref 0.3–1.2)
Total Protein: 7.2 g/dL (ref 6.5–8.1)

## 2015-09-22 LAB — LIPID PANEL
CHOL/HDL RATIO: 3.7 ratio
Cholesterol: 305 mg/dL — ABNORMAL HIGH (ref 0–200)
HDL: 83 mg/dL (ref >40)
LDL CALC: 174 mg/dL — AB (ref 0–99)
Triglycerides: 241 mg/dL — ABNORMAL HIGH (ref <150)
VLDL: 48 mg/dL — AB (ref 0–40)

## 2015-09-22 LAB — TROPONIN I
Troponin I: <0.03 ng/mL (ref <0.03)
Troponin I: <0.03 ng/mL (ref <0.03)

## 2015-09-22 MED ORDER — ENOXAPARIN SODIUM 40 MG/0.4ML ~~LOC~~ SOLN
40.0000 mg | Freq: Every day | SUBCUTANEOUS | Status: DC
  Administered 2015-09-22 – 2015-09-23 (×2): 40 mg via SUBCUTANEOUS
  Filled 2015-09-22 (×3): qty 0.4

## 2015-09-22 MED ORDER — POTASSIUM CHLORIDE CRYS ER 20 MEQ PO TBCR
40.0000 meq | EXTENDED_RELEASE_TABLET | Freq: Once | ORAL | Status: AC
  Administered 2015-09-22: 40 meq via ORAL
  Filled 2015-09-22: qty 2

## 2015-09-22 MED ORDER — IPRATROPIUM-ALBUTEROL 0.5-2.5 (3) MG/3ML IN SOLN
3.0000 mL | RESPIRATORY_TRACT | Status: DC
  Administered 2015-09-22 (×2): 3 mL via RESPIRATORY_TRACT
  Filled 2015-09-22 (×2): qty 3

## 2015-09-22 MED ORDER — ACETAMINOPHEN 325 MG PO TABS
650.0000 mg | ORAL_TABLET | Freq: Four times a day (QID) | ORAL | Status: DC | PRN
  Administered 2015-09-22: 650 mg via ORAL
  Filled 2015-09-22: qty 2

## 2015-09-22 MED ORDER — ASPIRIN EC 81 MG PO TBEC
81.0000 mg | DELAYED_RELEASE_TABLET | Freq: Every day | ORAL | Status: DC
  Administered 2015-09-22 – 2015-09-23 (×2): 81 mg via ORAL
  Filled 2015-09-22 (×2): qty 1

## 2015-09-22 MED ORDER — PNEUMOCOCCAL VAC POLYVALENT 25 MCG/0.5ML IJ INJ
0.5000 mL | INJECTION | INTRAMUSCULAR | Status: DC
  Filled 2015-09-22: qty 0.5

## 2015-09-22 MED ORDER — AMLODIPINE BESYLATE 5 MG PO TABS: 5.0000 mg | ORAL_TABLET | Freq: Every day | ORAL | Status: DC

## 2015-09-22 MED ORDER — ALUM & MAG HYDROXIDE-SIMETH 200-200-20 MG/5ML PO SUSP
30.0000 mL | Freq: Four times a day (QID) | ORAL | Status: DC | PRN
  Administered 2015-09-23: 30 mL via ORAL
  Filled 2015-09-22: qty 30

## 2015-09-22 MED ORDER — DM-GUAIFENESIN ER 30-600 MG PO TB12
1.0000 | ORAL_TABLET | Freq: Two times a day (BID) | ORAL | Status: DC
  Administered 2015-09-22 – 2015-09-23 (×2): 1 via ORAL
  Filled 2015-09-22 (×2): qty 1

## 2015-09-22 MED ORDER — IPRATROPIUM-ALBUTEROL 0.5-2.5 (3) MG/3ML IN SOLN
3.0000 mL | Freq: Three times a day (TID) | RESPIRATORY_TRACT | Status: DC
  Administered 2015-09-22 – 2015-09-23 (×3): 3 mL via RESPIRATORY_TRACT
  Filled 2015-09-22 (×3): qty 3

## 2015-09-22 MED ORDER — ZOLPIDEM TARTRATE 5 MG PO TABS: 5.0000 mg | ORAL_TABLET | Freq: Every evening | ORAL | Status: DC | PRN

## 2015-09-22 MED ORDER — ACETAMINOPHEN 650 MG RE SUPP: 650.0000 mg | Freq: Four times a day (QID) | RECTAL | Status: DC | PRN

## 2015-09-22 MED ORDER — ARFORMOTEROL TARTRATE 15 MCG/2ML IN NEBU
15.0000 ug | INHALATION_SOLUTION | Freq: Two times a day (BID) | RESPIRATORY_TRACT | Status: DC
  Administered 2015-09-22 – 2015-09-23 (×2): 15 ug via RESPIRATORY_TRACT
  Filled 2015-09-22 (×2): qty 2

## 2015-09-22 MED ORDER — BUDESONIDE 0.25 MG/2ML IN SUSP
0.2500 mg | Freq: Two times a day (BID) | RESPIRATORY_TRACT | Status: DC
  Administered 2015-09-22 – 2015-09-23 (×2): 0.25 mg via RESPIRATORY_TRACT
  Filled 2015-09-22 (×2): qty 2

## 2015-09-22 MED ORDER — PREDNISONE 20 MG PO TABS
40.0000 mg | ORAL_TABLET | Freq: Every day | ORAL | Status: DC
  Administered 2015-09-22 – 2015-09-23 (×2): 40 mg via ORAL
  Filled 2015-09-22 (×2): qty 2

## 2015-09-22 MED ORDER — DICLOFENAC SODIUM 1 % TD GEL
2.0000 g | Freq: Four times a day (QID) | TRANSDERMAL | Status: DC | PRN
  Administered 2015-09-22: 2 g via TOPICAL
  Filled 2015-09-22: qty 100

## 2015-09-22 MED ORDER — ALBUTEROL SULFATE (2.5 MG/3ML) 0.083% IN NEBU: 2.5000 mg | INHALATION_SOLUTION | RESPIRATORY_TRACT | Status: DC | PRN

## 2015-09-22 MED ORDER — SODIUM CHLORIDE 0.9% FLUSH
3.0000 mL | Freq: Two times a day (BID) | INTRAVENOUS | Status: DC
  Administered 2015-09-22 – 2015-09-23 (×2): 3 mL via INTRAVENOUS

## 2015-09-22 MED ORDER — PANTOPRAZOLE SODIUM 40 MG PO TBEC
40.0000 mg | DELAYED_RELEASE_TABLET | Freq: Every day | ORAL | Status: DC
  Administered 2015-09-22 – 2015-09-23 (×2): 40 mg via ORAL
  Filled 2015-09-22 (×2): qty 1

## 2015-09-22 NOTE — ED Notes (Signed)
Patient requesting something to eat and drink, also needs something for pain - states her "lungs hurt"

## 2015-09-22 NOTE — H&P (Signed)
Date: 09/22/2015               Patient Name:  Kimberly Kemp MRN: [id number]  DOB: 12-Mar-1965 Age / Sex: 50 y.o., female   PCP: Rosita Fire, MD         Medical Service: Internal Medicine Teaching Service         Attending Physician: Dr. Annia Belt, MD    First Contact: Dr. Ledell Noss Pager: [phone]  Second Contact: Dr. Jacques Earthly Pager: [phone]       After Hours (After 5p/  First Contact Pager: [phone]  weekends / holidays): Second Contact Pager: [phone]   Chief Complaint: Shortness of breath  History of Present Illness: Kimberly Kemp is a 50 year old woman with PMH asthma, COPD, CVA, A-fib , and pulmonary embolism who presented to the ED via EMS for acute worsening of shortness of breath. Per the patient - her wheezing/shortness of breath from COPD has been progressively worsening over the past 4-5 weeks. She says this is because her nebulizer machine broke and she could not get a new one until an she sees her pulmonologist (scheduled for this coming Monday) and has been without her three nebulizer medications. Her shortness of breath acutely worsened at home today, with significant wheezing and cough (longstanding dry cough, but now productive of green sputum). She has also been experiencing episodes of intense chest pressure and intermittent dizziness for the past few days. She also experiences bilateral pleuritic chest pain and palpitations daily for years. She attempted to drive to hospital with her daughter but had to pull over for EMS transport. Per EMS was hypertensive to 200/100, tachycardic to 130s, could not tolerate CPAP but received 15 mg albuterol, 1 mg atrovent, 125mg  of solumedrol, 2g of magnesium, 2x duonebs, and racemic epi prior to arrival.   In the ED was tachypneic with diffuse wheezing and increased WOB, placed on BiPAP and significantly clinical improvement. Labs remarkable for K 3.2, CO2 18, Cr 1.16 (baseline 0.8), WBC 17.8. ABG shortly after starting  BiPAP, pH 7.5 / 25 / 277 / 20. Troponin 0.03, CXR unremarkable, EKG showed sinus tach, PVC, LAE, LVH, non-spec T abnormalities in anterolateral leads.   Meds:   Albuterol Q4prn Brovana neb BID Exedrine q6 prn Tessalon TID prn Pulmicort neb BID Chlorpheniramine maleate q6h prn antihistamine Tussionex QHS prn Mucinex BID Diclofenac TID prn pain Atrovent neb q4h prn Omeprazole qd  Allergies: Allergies as of 09/22/2015 - Review Complete 09/22/2015  Allergen Reaction Noted  . Penicillins Hives    Past Medical History:  Diagnosis Date  . Anxiety   . Asthma   . Chronic pain   . Cirrhosis ([place])   . COPD (chronic obstructive pulmonary disease) ([place])   . Depression   . Emphysema   . GERD (gastroesophageal reflux disease)   . Hepatitis B   . Hypertension   . Paroxysmal atrial fibrillation (H[facility])   . Pulmonary embolism ([place])   . Stroke ([place])   . Tobacco abuse     Family History:  Family History  Problem Relation Age of Onset  . Emphysema Father   . Prostate cancer Father   . Liver disease Father   . Emphysema Maternal Grandmother   . Diabetes Mother   . Heart disease Mother   . Colon cancer Neg Hx     Social History:  Social History   Social History  . Marital status: Married    Spouse name: N/A  .  Number of children: 4  . Years of education: N/A   Occupational History  .  Unemployed    on disability   Social History Main Topics  . Smoking status: Current Every Day Smoker    Packs/day: 0.50    Years: 43.00    Types: Cigarettes  . Smokeless tobacco: Never Used  . Alcohol use No  . Drug use: No  . Sexual activity: Not on file   Other Topics Concern  . Not on file   Social History Narrative   Lives in [place] with husband and daughter, on disability. Grandchildren visit daily.   Review of Systems: She endorses longstanding intermittent bilateral leg swelling, LUE swelling. She endorses abdominal pain, diarrhea, constipation for years. She denies  obvious sick contact but granddaughter has allergies at home and may be sick. She mentions intermittent episodes of left -sided numbness similar to her stroke years ago that self-resolve. She denies fevers/chills, syncope, recent travel, new extremity swelling, jaw pain, no OCP use.  Physical Exam: Blood pressure 188/89, pulse 102, temperature 99.6 F (37.6 C), temperature source Oral, resp. rate 19, height 5\' 3"  (1.6 m), weight 57.6 kg (127 lb), last menstrual period 08/06/2015, SpO2 97 %.   General appearance: Woman laying on stretcher in no apparent distress, conversational, able to remove BiPAP mask, appears older than stated age HENT: Normocephalic, atraumatic, moist mucous membranes, poor dentition, scattered oral lesions, moist mucous membranes, neck supple, no JVD Eyes: PERRL, non-icteric Cardiovascular: Mildly tachycardic, regular rate and rhythm, no m/r/g Respiratory: Clear to ascultation bilaterally, normal work of breathing Abdomen: BS+, soft, non-tender, non-distended Skin: Warm, dry, intact Neuro: Alert and oriented, cranial nerves grossly intact Psych: Appropriate affect  EKG:  Sinus tachycardia Ventricular premature complex LAE, consider biatrial enlargement Probable left ventricular hypertrophy Nonspecific T abnrm, anterolateral leads  CXR: No acute process  Assessment & Plan by Problem:  COPD exacerbation ([place]), acute, secondary to lack of nebulizer treatments at home for several weeks, no overt productive cough and no fevers/chills, not on home oxygen, significant resolution of symptoms s/p 15mg  albuterol, 1mg  atrovent, 125mg  solumedrol, 2g Mg, 2x duonebs and racemic epi per EMS, 1 hour BiPAP in the ED. No new findings on CXR. Maintaining O2 sats off BiPAP during conversation on admission, resting comfortably with no SOB. History of PE but unlikely given several weeks of progressive symptoms, no new extremity swelling, travel, hemoptysis.  -- Q4H Duonebs, space as  tolerated tomorrow and then resume home nebulizer regimen  -- Prednisone 40mg  po daily for five days beginning tomorrow  -- No overt indication for antibiotics at this time  -- Sputum culture, may assist if deteriorates in future  -- BiPAP PRN  -- Supplemental O2 as needed to maintain O2 sats 88-92%  Chest pain, acute on chronic, initial troponin 0.03, initial EKG without ST changes but LVH, LAE endorsing significant substernal chest pressure last few days, has longstanding bilateral pleuritic chest pain at baseline  -- Trend troponins Q6 x3  -- Repeat EKG in AM  -- Telemetry  -- Consider TTE  History of A-fib, CHADSVASc score of 4, not on anticoagulation on home, no clear records of per chart  -- Telemetry  -- Lovenox for DVT ppx  -- Discuss need for long-term anticoagulation prior to discharge  History of stroke, no residual symptoms but reports intermittent episodes of L-sided numbness suspicious for TIA, patient reports history of a-fib in the past, possible PFO/ASD  -- Lipid panel   -- Start statin   --  Asa 81 mg QD  -- Hypertensive control  -- Counsel smoking cessation  -- Outpatient PCP follow up needed  Essential hypertension, checks bp at home 3-4x daily, typically runs 170s/100s, not on any medications, some hypertension per EMS and on arrival, currently 146/80  -- Continue to monitor for now  -- Potentially start CCB (Amlodipine 5mg  QD) prior to discharge of hypertension recurrs  Hypokalemia, mild, K 3.4, s/p numerous albuterol treatments  -- 40 mEq KCl po x1  Leukocytosis, WBC 17.8, likely secondary to steroid administration, no over infectious symptoms at this time  -- Trend CBCs  Tobacco abuse, 0.5-1 ppd since age 75, poorly-controlled COPD, CVA, HTN  -- Counsel cessation  -- PCP follow up  -- Nicotine patch while inpatient    GERD  -- PPI  FEN/GI: Heart healthy diet, electrolytes as needed, PPI  DVTppx: lovenox  Code status: Full code  Dispo: Admit  patient to Observation with expected length of stay less than 2 midnights.  Signed: Asencion Partridge, MD 09/22/2015, 3:39 AM  Pager: [phone]

## 2015-09-22 NOTE — ED Notes (Signed)
EKG showed to MD wickline and placed in medical records.

## 2015-09-22 NOTE — ED Notes (Signed)
Report attempted x 1

## 2015-09-22 NOTE — Progress Notes (Signed)
Subjective: Today Kimberly Kemp is having a much easier time breathing. She says her chest pain has improved significantly as well. She has a broken nebulizer at home but has a follow up appointment with her pulmonologist on Monday. Denies new cough wheeze fever or sputum production.   Objective:  Vital signs in last 24 hours: Vitals:   09/22/15 0800 09/22/15 0815 09/22/15 0830 09/22/15 0845  BP: 119/63 103/59 114/63 122/68  Pulse: 86 84 91 84  Resp: 22 17 20 22   Temp:    98.5 F (36.9 C)  TempSrc:    Oral  SpO2: 98% 98% 95% 98%  Weight:      Height:       Physical Exam  Constitutional: She appears well-developed and well-nourished.  Eyes: EOM are normal. Pupils are equal, round, and reactive to light.  Cardiovascular: Normal rate and regular rhythm.   No murmur heard. Pulmonary/Chest: No respiratory distress. She has no wheezes. She has no rales.  Abdominal: Soft. Bowel sounds are normal.  Neurological: She is alert. She displays normal reflexes.  Extremities: no peripheral edema, no calf pain  Labs: CBC:  Recent Labs Lab 09/22/15 0110 09/22/15 0547  WBC 17.8* 21.9*  NEUTROABS 10.2* 21.1*  HGB 13.3 12.6  HCT 39.3 38.1  MCV 85.8 86.8  PLT 0000000 [id number]   Metabolic Panel:  Recent Labs Lab 09/22/15 0110 09/22/15 0547  NA 137 137  K 3.2* 4.5  CL 107 106  CO2 18* 21*  GLUCOSE 144* 129*  BUN 16 13  CREATININE 1.16* 1.10*  CALCIUM 9.5 9.0  ALT  --  20  ALKPHOS  --  58  BILITOT  --  0.6  PROT  --  7.2  ALBUMIN  --  4.0   Cardiac Labs:  Recent Labs Lab 09/22/15 0110 09/22/15 0547  TROPONINI <0.03 <0.03   Microbiology  Sputum cultures specimen was not representative of lower respiratory secretion  Imaging: CXray 8/19 COPD without acute superimposed finding.   Medications:   Scheduled Medications: . aspirin EC  81 mg Oral Daily  . enoxaparin (LOVENOX) injection  40 mg Subcutaneous Daily  . ipratropium-albuterol  3 mL Nebulization Q4H  . pantoprazole   40 mg Oral Daily  . predniSONE  40 mg Oral Q breakfast  . sodium chloride flush  3 mL Intravenous Q12H   PRN Medications: acetaminophen **OR** acetaminophen, diclofenac sodium  Assessment/Plan: Pt is a 50 y.o. yo female with a PMHx of  asthma,COPD, CVA, A-fib , and pulmonary embolism who was admitted on 09/22/2015 with symptoms of worsening shortness of breath, which was determined to be secondary to COPD exacerbation.  Principal Problem:   COPD exacerbation ([place]) Active Problems:   GERD (gastroesophageal reflux disease)   Chest pain   Essential hypertension   History of stroke   Leukocytosis  COPD exacerbation secondary to lack nebulizer treatment at home. Has had much improvement of shortness of breath since yesterday. She has been successfully weaned off BiPAP to room air. She was started on prednisone and DuoNeb yesterday home albuterol and budesonide nebs were restarted today. We'll continue prednisone for a total of 5 days. Sputum cultures indeterminate. Supplemental O2 as needed to maintain SpO2 88-92%.   Chest pain troponins x3 negative, EKG show no signs of myocardial ischemia. Has a history of pleuritic chest pain at baseline. She reports improvement in chest pain today.   History of A-fib currently sinus rhythm. On telemetry monitoring. History is unclear, patient states she was born with  A-fib she does not follow with a cardiologist but was told to take aspirin. None of the EKGs in our system dating back to 2009 show Afib. CHADSVASc score of 4. Currently she is on Lovenox for DVT ppx.   Essential hypertension Has remained normotensive since ED admission. Not on any home medications. Will continue to monitor for now.  Hypokalemia Potassium was 3.2 on admission she was given K-Dur 40 mEq potassium normalized on 8/19 BMET   Leukocytosis Neutrophilia on differential, likely secondary to steroid administration. No signs or symptoms of infection at this time.   Tobacco abuse  continue nicotine patch GERD continue PPI  Dispo: Anticipated discharge in approximately 1-2 day(s).   LOS: 0 days   Ledell Noss, MD 09/22/2015, 8:54 AM Pager: [phone]

## 2015-09-22 NOTE — ED Notes (Signed)
Admitting MD at the bedside.  

## 2015-09-22 NOTE — ED Notes (Signed)
Report given to yasemia, rn and morgan, rn.

## 2015-09-22 NOTE — ED Notes (Signed)
Patient switched from [facility] treatment to bipap on arrival by respiratory therapist. abg present currently being drawn.

## 2015-09-22 NOTE — ED Provider Notes (Signed)
[facility] DEPT Provider Note   CSN: [id number] Arrival date & time: 09/22/15  0101  By signing my name below, I, Kimberly Kemp, attest that this documentation has been prepared under the direction and in the presence of Kimberly Fraise, MD . Electronically Signed: Estanislado Kemp, Scribe. 09/22/2015. 1:26 AM.    History   Chief Complaint Chief Complaint  Patient presents with  . Shortness of Breath   LEVEL 5 CAVEAT DUE TO RESPIRATORY DISTRESS  The history is provided by the patient and the EMS personnel. No language interpreter was used.  Shortness of Breath  This is a new problem.  HPI Comments:  Kimberly Kemp is a 50 y.o. female smoker with a history of asthma, COPD, emphysema, stroke, A-Fib, and pulmonary embolism who presents to the Emergency Department via EMS, here for sudden, worsening shortness of breath onset just prior to arrival. Per EMS, pt was driving in the car with her daughter and suddenly began exhibiting shortness of breath. Daughter then pulled over and called 911. Pt was given 15 albuterol, 2 neb treatments, epinephrine, 2 g magnesium, solumedrol, and Atrovent en route to department. Pt denies any previous intubation.   Past Medical History:  Diagnosis Date  . Anxiety   . Asthma   . Chronic pain   . Cirrhosis ([place])   . COPD (chronic obstructive pulmonary disease) ([place])   . Depression   . Emphysema   . GERD (gastroesophageal reflux disease)   . Hepatitis B   . Hypertension   . Paroxysmal atrial fibrillation ([facility])   . Pulmonary embolism ([place])   . Stroke ([place])   . Tobacco abuse     Patient Active Problem List   Diagnosis Date Noted  . Acute exacerbation of chronic obstructive pulmonary disease (COPD) ([place]) 09/04/2015  . Acute bronchitis 04/04/2015  . Hemoptysis 06/30/2014  . Thrush 07/22/2013  . Chest pain 04/27/2013  . GERD (gastroesophageal reflux disease) 06/18/2012  . Chronic cough 06/18/2012  . EMPHYSEMA 05/27/2007    Past Surgical  History:  Procedure Laterality Date  . APPENDECTOMY     microscopic   . BRONCHOSCOPY  March 2008, Feb 2012  . CESAREAN SECTION      OB History    No data available       Home Medications    Prior to Admission medications   Medication Sig Start Date End Date Taking? Authorizing Provider  albuterol (PROAIR HFA) 108 (90 BASE) MCG/ACT inhaler INHALE 2 PUFFS INTO THE LUNGS EVERY FOUR HOURS AS NEEDED FOR WHEEZING 01/04/15   Chesley Mires, MD  arformoterol (BROVANA) 15 MCG/2ML NEBU Take 2 mLs (15 mcg total) by nebulization 2 (two) times daily. Dx 492.8 10/03/14   Chesley Mires, MD  aspirin-acetaminophen-caffeine (EXCEDRIN MIGRAINE) [phone] MG per tablet Take 2 tablets by mouth every 6 (six) hours as needed for headache.    Historical Provider, MD  benzonatate (TESSALON) 100 MG capsule Take 1-2 capsules (100-200 mg total) by mouth 3 (three) times daily as needed for cough. 08/06/15   Tammy S Parrett, NP  budesonide (PULMICORT) 0.25 MG/2ML nebulizer solution USE 1 VIAL PER NEBULIZER TWICE A DAY 01/04/15   Chesley Mires, MD  Chlorpheniramine Maleate 2 MG/ML LIQD Take 2 mLs (4 mg total) by mouth every 6 (six) hours as needed. 02/17/14   Chesley Mires, MD  chlorpheniramine-HYDROcodone (TUSSIONEX PENNKINETIC ER) 10-8 MG/5ML SUER Take 5 mLs by mouth at bedtime as needed for cough. 04/04/15   Javier Glazier, MD  dextromethorphan-guaiFENesin [facility] DM) 30-600 MG  per 12 hr tablet Take 1 tablet by mouth every 12 (twelve) hours.      Historical Provider, MD  diclofenac (CATAFLAM) 50 MG tablet Take 1 tablet (50 mg total) by mouth 3 (three) times daily. One tablet TID with food prn pain. 07/16/15   Janne Napoleon, NP  ipratropium (ATROVENT) 0.02 % nebulizer solution USE 1.25 ML (1/2 VIAL) EVERY 4 HOURS AS NEEDED FOR WHEEZING.Marland KitchenMarland KitchenDX 492.8 04/11/15   Chesley Mires, MD  omeprazole (PRILOSEC) 40 MG capsule Take 1 capsule (40 mg total) by mouth daily. 09/10/15   Chesley Mires, MD  predniSONE (DELTASONE) 10 MG tablet Take 4 tabs with  food x 3 days, then 3 tabs x 3 days, then 2 tabs x 3 days, then 1 tab x3 days then stop. 09/04/15   Rigoberto Noel, MD  traMADol (ULTRAM) 50 MG tablet Take 1 tablet (50 mg total) by mouth every 6 (six) hours as needed. 07/16/15   Janne Napoleon, NP  zolpidem (AMBIEN) 5 MG tablet Take 5 mg by mouth at bedtime as needed for sleep.     Historical Provider, MD    Family History Family History  Problem Relation Age of Onset  . Emphysema Father   . Prostate cancer Father   . Liver disease Father   . Emphysema Maternal Grandmother   . Diabetes Mother   . Heart disease Mother   . Colon cancer Neg Hx     Social History Social History  Substance Use Topics  . Smoking status: Current Every Day Smoker    Packs/day: 1.00    Years: 36.00    Types: Cigarettes  . Smokeless tobacco: Never Used  . Alcohol use No     Allergies   Penicillins   Review of Systems Review of Systems  Unable to perform ROS: Severe respiratory distress     Physical Exam Updated Vital Signs BP (!) 169/112 (BP Location: Right Arm)   Pulse (!) 134   Temp 99.6 F (37.6 C) (Oral)   Resp 26   Ht 5\' 3"  (1.6 m)   Wt 127 lb (57.6 kg)   LMP 08/06/2015 (Exact Date)   SpO2 100%   BMI 22.50 kg/m   Physical Exam CONSTITUTIONAL: Severe distress noted HEAD: Normocephalic/atraumatic EYES: EOMI/PERRL ENMT: Nebulizer mask in place.  NECK: supple no meningeal signs SPINE/BACK:entire spine nontender CV: S1/S2 noted, tachycardia LUNGS: Tachypnea; wheezing bilaterally ABDOMEN: soft, nontender, no rebound or guarding, bowel sounds noted throughout abdomen GU:no cva tenderness NEURO: Pt is awake/alert/appropriate, moves all extremitiesx4.   EXTREMITIES: pulses normal/equal, full ROM; no lower extremity edema SKIN: warm, color normal PSYCH: anxious  ED Treatments / Results  DIAGNOSTIC STUDIES:  Oxygen Saturation is 100% on BiPAP, Normal by my interpretation.    COORDINATION OF CARE:  1:26 AM Will order blood work,  EKG and imaging. Will place on BiPAP. Discussed treatment plan with pt at bedside and pt agreed to plan.  Labs (all labs ordered are listed, but only abnormal results are displayed) Labs Reviewed  BASIC METABOLIC PANEL - Abnormal; Notable for the following:       Result Value   Potassium 3.2 (*)    CO2 18 (*)    Glucose, Bld 144 (*)    Creatinine, Ser 1.16 (*)    GFR calc non Af Amer 54 (*)    All other components within normal limits  CBC WITH DIFFERENTIAL/PLATELET - Abnormal; Notable for the following:    WBC 17.8 (*)    Neutro Abs 10.2 (*)  Lymphs Abs 6.2 (*)    Monocytes Absolute 1.4 (*)    All other components within normal limits  I-STAT ARTERIAL BLOOD GAS, ED - Abnormal; Notable for the following:    pH, Arterial 7.500 (*)    pCO2 arterial 25.8 (*)    pO2, Arterial 277.0 (*)    All other components within normal limits  TROPONIN I    EKG  EKG Interpretation  Date/Time:  Saturday September 22 2015 01:03:46 EDT Ventricular Rate:  121 PR Interval:    QRS Duration: 86 QT Interval:  330 QTC Calculation: 469 R Axis:   11 Text Interpretation:  Sinus tachycardia Ventricular premature complex LAE, consider biatrial enlargement Probable left ventricular hypertrophy Nonspecific T abnrm, anterolateral leads Abnormal ekg hyper acute T waves noted Confirmed by Christy Gentles  MD, Shilee Biggs ([id number]) on 09/22/2015 1:09:51 AM       Radiology Dg Chest Port 1 View  Result Date: 09/22/2015 CLINICAL DATA:  Shortness of breath EXAM: PORTABLE CHEST 1 VIEW COMPARISON:  09/04/2015 FINDINGS: Chronic hyperinflation. There is no edema, consolidation, effusion, or pneumothorax. Normal heart size and mediastinal contours. Bilateral nipple shadows as confirmed on recent two-view study. IMPRESSION: COPD without acute superimposed finding. Electronically Signed   By: Monte Fantasia M.D.   On: 09/22/2015 01:31    Procedures Procedures  CRITICAL CARE Performed by: Sharyon Cable Total critical care  time: 35 minutes Critical care time was exclusive of separately billable procedures and treating other patients. Critical care was necessary to treat or prevent imminent or life-threatening deterioration. Critical care was time spent personally by me on the following activities: development of treatment plan with patient and/or surrogate as well as nursing, discussions with consultants, evaluation of patient's response to treatment, examination of patient, obtaining history from patient or surrogate, ordering and performing treatments and interventions, ordering and review of laboratory studies, ordering and review of radiographic studies, pulse oximetry and re-evaluation of patient's condition.   Medications Ordered in ED Medications - No data to display   Initial Impression / Assessment and Plan / ED Course  I have reviewed the triage vital signs and the nursing notes.  Pertinent labs & imaging results that were available during my care of the patient were reviewed by me and considered in my medical decision making (see chart for details).  Clinical Course    Pt seen on arrival She was immediately placed on bipap She is improving, wheezing improved Work of breathing improved Tachycardia improved Will admit 2:20 AM Pt stable She is improved She received meds prior to arrival, continue on bipap She tells me she does not have PCP Admit to unassigned Admitted to internal medicine service    Final Clinical Impressions(s) / ED Diagnoses   Final diagnoses:  COPD exacerbation ([place])  Acute respiratory failure, unspecified whether with hypoxia or hypercapnia ([place])    New Prescriptions New Prescriptions   No medications on file  I personally performed the services described in this documentation, which was scribed in my presence. The recorded information has been reviewed and is accurate.         Kimberly Fraise, MD 09/22/15 [phone]

## 2015-09-22 NOTE — Evaluation (Signed)
Physical Therapy Evaluation Patient Details Name: Kimberly Kemp MRN: [id number] DOB: 11/28/1965 Today's Date: 09/22/2015   History of Present Illness  Pt adm with COPD exacerbation. PMH - COPD, CVA, afib, PE, asthma  Clinical Impression  Pt doing well with mobility and no further PT needed. SpO2 98% on RA with activity.      Follow Up Recommendations No PT follow up    Equipment Recommendations  None recommended by PT    Recommendations for Other Services       Precautions / Restrictions Precautions Precautions: None Restrictions Weight Bearing Restrictions: No      Mobility  Bed Mobility Overal bed mobility: Independent                Transfers Overall transfer level: Independent                  Ambulation/Gait Ambulation/Gait assistance: Independent Ambulation Distance (Feet): 400 Feet Assistive device: None Gait Pattern/deviations: WFL(Within Functional Limits)   Gait velocity interpretation: at or above normal speed for age/gender General Gait Details: Steady gait. SpO2 98% on RA with amb  Stairs            Wheelchair Mobility    Modified Rankin (Stroke Patients Only)       Balance Overall balance assessment: Independent                                           Pertinent Vitals/Pain Pain Assessment: No/denies pain    Home Living Family/patient expects to be discharged to:: Private residence                      Prior Function Level of Independence: Independent               Hand Dominance        Extremity/Trunk Assessment   Upper Extremity Assessment: Overall WFL for tasks assessed           Lower Extremity Assessment: Overall WFL for tasks assessed      Cervical / Trunk Assessment: Normal  Communication   Communication: No difficulties  Cognition Arousal/Alertness: Awake/alert Behavior During Therapy: WFL for tasks assessed/performed Overall Cognitive Status: Within  Functional Limits for tasks assessed                      General Comments      Exercises        Assessment/Plan    PT Assessment Patent does not need any further PT services  PT Diagnosis Difficulty walking   PT Problem List    PT Treatment Interventions     PT Goals (Current goals can be found in the Care Plan section) Acute Rehab PT Goals PT Goal Formulation: All assessment and education complete, DC therapy    Frequency     Barriers to discharge        Co-evaluation               End of Session   Activity Tolerance: Patient tolerated treatment well Patient left: in bed;with call bell/phone within reach Nurse Communication: Mobility status         Time: [id number] PT Time Calculation (min) (ACUTE ONLY): 10 min   Charges:   PT Evaluation $PT Eval Low Complexity: 1 Procedure     PT G Codes:  Drystan Reader 09/22/2015, 12:26 PM [facility] PT [phone]

## 2015-09-22 NOTE — ED Notes (Signed)
Pt ambulated well with a pulse ox of 99%. Pt did say that her chest had some pressure while she ambulated

## 2015-09-22 NOTE — ED Notes (Signed)
Portable x-ray at the bedside.  

## 2015-09-22 NOTE — ED Notes (Signed)
Patient attempted to drive self to the hospital for shortness of breath, had to pull over for ems transport. Best bp reading by ems: 200/100, pulse 130s. Attempted to place cpap, but patient could not tolerate. Prior to arrival, she has received 15mg  of albuterol, 1.0 mg of atrovent, 125mg  of solumedrol, 2g of magnesium, 2 duonebs, and racemic epi prior to arrival to er. Wheezing and rhonchi auscultated.

## 2015-09-23 LAB — BASIC METABOLIC PANEL
Anion gap: 10 (ref 5–15)
BUN: 19 mg/dL (ref 6–20)
CHLORIDE: 105 mmol/L (ref 101–111)
CO2: 23 mmol/L (ref 22–32)
CREATININE: 0.91 mg/dL (ref 0.44–1.00)
Calcium: 9.1 mg/dL (ref 8.9–10.3)
GFR calc non Af Amer: >60 mL/min (ref >60)
GLUCOSE: 106 mg/dL — AB (ref 65–99)
Potassium: 3.7 mmol/L (ref 3.5–5.1)
Sodium: 138 mmol/L (ref 135–145)

## 2015-09-23 MED ORDER — BUDESONIDE 0.25 MG/2ML IN SUSP: 0.2500 mg | Freq: Two times a day (BID) | RESPIRATORY_TRACT | Status: DC

## 2015-09-23 MED ORDER — PREDNISONE 20 MG PO TABS: 40.0000 mg | ORAL_TABLET | Freq: Every day | ORAL | 0 refills | Status: DC

## 2015-09-23 MED ORDER — ARFORMOTEROL TARTRATE 15 MCG/2ML IN NEBU
15.0000 ug | INHALATION_SOLUTION | Freq: Two times a day (BID) | RESPIRATORY_TRACT | Status: DC
Start: 2015-09-23 — End: 2015-09-23

## 2015-09-23 MED ORDER — ARFORMOTEROL TARTRATE 15 MCG/2ML IN NEBU
15.0000 ug | INHALATION_SOLUTION | Freq: Two times a day (BID) | RESPIRATORY_TRACT | Status: AC
  Administered 2015-09-23: 15 ug via RESPIRATORY_TRACT
  Filled 2015-09-23: qty 2

## 2015-09-23 MED ORDER — BUDESONIDE 0.25 MG/2ML IN SUSP
0.2500 mg | Freq: Two times a day (BID) | RESPIRATORY_TRACT | Status: AC
  Administered 2015-09-23: 0.25 mg via RESPIRATORY_TRACT
  Filled 2015-09-23: qty 2

## 2015-09-23 NOTE — Progress Notes (Signed)
Kimberly Kemp to be D/C'd to home per MD order.  Discussed with the patient and all questions fully answered.  VSS, Skin clean, dry and intact without evidence of skin break down, no evidence of skin tears noted. IV catheter discontinued intact. Site without signs and symptoms of complications. Dressing and pressure applied.  An After Visit Summary was printed and given to the patient. Patient received prescription.  D/c education completed with patient/family including follow up instructions, medication list, d/c activities limitations if indicated, with other d/c instructions as indicated by MD - patient able to verbalize understanding, all questions fully answered.   Patient instructed to return to ED, call 911, or call MD for any changes in condition.   Patient escorted via [place], and D/C home via private auto.  [name] 09/23/2015 3:49 PM

## 2015-09-23 NOTE — Progress Notes (Signed)
   Subjective: This morning, she does not report any difficulty breathing or worsening cough overnight. She feels like she would like to go home today and she has a follow-up appointment with her pulmonologist on Monday. She is interesting in establishing care with a primary care clinic as well.  Objective:  Vital signs in last 24 hours: Vitals:   09/22/15 1916 09/22/15 2210 09/23/15 0548 09/23/15 0814  BP:  127/67 (!) 117/58   Pulse:  76 68   Resp:  18 18   Temp:  98.2 F (36.8 C) 97.7 F (36.5 C)   TempSrc:      SpO2: 96% 97% 100% 97%  Weight:      Height:       Physical Exam  Constitutional: She is oriented to person, place, and time. No distress.  HENT:  Head: Normocephalic and atraumatic.  Eyes: Conjunctivae are normal. No scleral icterus.  Cardiovascular: Normal rate and regular rhythm.   Pulmonary/Chest: Effort normal. No respiratory distress. She has no wheezes.  Abdominal: Soft. Bowel sounds are normal.  Neurological: She is alert and oriented to person, place, and time.  Skin: She is not diaphoretic.     Assessment/Plan:  Principal Problem:   COPD exacerbation (HCC) Active Problems:   GERD (gastroesophageal reflux disease)   Chest pain   Essential hypertension   History of stroke   Leukocytosis   Acute respiratory failure ([place])   Mixed hyperlipidemia  Kimberly Kemp is a 50 year old female with asthma/COPD, history of CVA, history of PE, paroxysmal atrial fibrillation hospitalized on 09/22/15 for acute COPD exacerbation.  Acute COPD exacerbation: Secondary to nonadherence to medications in the setting of a broken nebulizer machine. No hypoxia noted overnight, and her physical exam today is reassuring for no respiratory distress. -Continue prednisone 40 mg to complete a total 5 day course of treatment -Residual home medications on discharge we will give her final treatment prior to leaving the hospital  Hypokalemia: Resolved. Potassium 3.7 this morning.  Dispo:  Anticipated discharge today.  Riccardo Dubin, MD 09/23/2015, 10:20 AM Pager: @[phone] @

## 2015-09-23 NOTE — Discharge Summary (Signed)
Name: Kimberly Kemp MRN: [id number] DOB: [birth year] 50 y.o. PCP: No primary care provider on file.  Date of Admission: 09/22/2015  1:01 AM Date of Discharge: 09/23/2015 Attending Physician: Annia Belt, MD  Discharge Diagnosis: 1. Acute COPD exacerbation   In the setting of broken nebulizer machine at home   Stabilized with nebs and Bipap   Principal Problem:   COPD exacerbation ([place]) Active Problems:   GERD (gastroesophageal reflux disease)   Chest pain   Essential hypertension   History of stroke   Leukocytosis   Acute respiratory failure (HCC)   Mixed hyperlipidemia   Discharge Medications:   Medication List    STOP taking these medications   aspirin-acetaminophen-caffeine 250-250-65 MG tablet Commonly known as:  EXCEDRIN MIGRAINE   benzonatate 100 MG capsule Commonly known as:  TESSALON   Chlorpheniramine Maleate 2 MG/ML Liqd   chlorpheniramine-HYDROcodone 10-8 MG/5ML Suer Commonly known as:  TUSSIONEX PENNKINETIC ER     TAKE these medications   albuterol 108 (90 Base) MCG/ACT inhaler Commonly known as:  PROAIR HFA INHALE 2 PUFFS INTO THE LUNGS EVERY FOUR HOURS AS NEEDED FOR WHEEZING   arformoterol 15 MCG/2ML Nebu Commonly known as:  BROVANA Take 2 mLs (15 mcg total) by nebulization 2 (two) times daily. Dx 492.8   budesonide 0.25 MG/2ML nebulizer solution Commonly known as:  PULMICORT USE 1 VIAL PER NEBULIZER TWICE A DAY   dextromethorphan-guaiFENesin 30-600 MG 12hr tablet Commonly known as:  MUCINEX DM Take 1 tablet by mouth every 12 (twelve) hours.   diclofenac 50 MG tablet Commonly known as:  CATAFLAM Take 1 tablet (50 mg total) by mouth 3 (three) times daily. One tablet TID with food prn pain.   ipratropium 0.02 % nebulizer solution Commonly known as:  ATROVENT USE 1.25 ML (1/2 VIAL) EVERY 4 HOURS AS NEEDED FOR WHEEZING.Marland KitchenMarland KitchenDX 492.8   omeprazole 40 MG capsule Commonly known as:  PRILOSEC Take 1 capsule (40 mg total) by mouth daily.   predniSONE 20 MG tablet Commonly known as:  DELTASONE Take 2 tablets (40 mg total) by mouth daily with breakfast.   traMADol 50 MG tablet Commonly known as:  ULTRAM Take 1 tablet (50 mg total) by mouth every 6 (six) hours as needed.   zolpidem 5 MG tablet Commonly known as:  AMBIEN Take 5 mg by mouth at bedtime as needed for sleep.       Disposition and follow-up:   [name] was discharged from [facility] in Good condition.  At the hospital follow up visit please address:  1. COPD- was she able to get her home nebulizer machine serviced? Did she complete the full course of steroids?   Hyperlipidemia - she should be started on a statin  2.  Labs / imaging needed at time of follow-up: none  3.  Pending labs/ test needing follow-up: none   Follow-up Appointments: Follow-up Information    [facility]. Schedule an appointment as soon as possible for a visit in 1 week(s).   Contact information: [address] [place], [place]. Daphane Shepherd on 09/24/2015.   Specialty:  Pulmonary Disease Contact information: [address] [place] [address] [phone]           Hospital Course by problem list: Principal Problem:   COPD exacerbation ([place]) Active Problems:   GERD (gastroesophageal reflux disease)   Chest pain   Essential hypertension  History of stroke   Leukocytosis   Acute respiratory failure ([place])   Mixed hyperlipidemia  Kimberly Kemp is a 50 year old woman woman with PMH asthma, COPD, and CVA who presented to the ED via EMS for acute worsening of shortness of breath. Per the patient - her wheezing/shortness of breath from COPD had been progressively worsening over the past 4-5 weeks. She said this was because her nebulizer machine broke and she could not get a new one until an she sees her pulmonologist (scheduled for 8/21) and has been without her three nebulizer  medications. Her shortness of breath acutely worsened at home today, with significant wheezing and cough (longstanding dry cough, but now productive of green sputum). She had also been experiencing episodes of intense chest pressure and intermittent dizziness for the past few days. She has experienced bilateral pleuritic chest pain and palpitations daily for years.  Per EMS was hypertensive to 200/100, tachycardic to 130s, could not tolerate CPAP but received 15 mg albuterol, 1 mg atrovent, 125mg  of solumedrol, 2g of magnesium, 2x duonebs, and racemic epi prior to arrival.   Acute COPD exacerbation In the ED was tachypneic with diffuse wheezing and increased WOB, placed on BiPAP and significantly clinical improvement. ABG shortly after starting BiPAP, pH 7.5 / pCO2 25 / pO2 277 / bicarb 20. Trigger was likely related to her broken nebulizer machine.Leukocytosis, WBC 17.8, likely secondary to steroid administration, no over infectious symptoms. She had no requirement for nasal canula oxygen after she was stabilized in the ED. She was restarted on duonebs and home nebulizers and started on prednisone 40 mg for a total course of 5 days.   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 There is no documentation of this in current chart records. Only prior cardiogram available is from 10/27/2014 which showed sinus rhythm as does the current tracing.  Tobacco abuse History of 0.5-1 ppd since age 50 with poorly controlled COPD, CVA, and HTN. She was counseled on cessation.    Discharge Vitals:   BP (!) 117/58 (BP Location: Left Arm)   Pulse 68   Temp 97.7 F (36.5 C)   Resp 18   Ht 5\' 3"  (1.6 m)   Wt 127 lb 6.4 oz (57.8 kg)   LMP 08/06/2015 (Exact Date)   SpO2 97%   BMI 22.57 kg/m   Pertinent Labs, Studies, and Procedures:  8/19 K 3.2, CO2 18, Cr 1.16 (baseline 0.8), WBC 17.8 8/19 Troponinx3 <0.03 EKG 8/19 sinus tach, premature ventricular contraction, left atrial  enlargement, left ventricular hypertrophy, non-spec T abnormalities in anterolateral leads.   8/19 Total cholesterol 305, TAG 241, LDL 174   PORTABLE CHEST 1 VIEW COMPARISON:  09/04/2015 FINDINGS: Chronic hyperinflation. There is no edema, consolidation, effusion, or pneumothorax. Normal heart size and mediastinal contours. Bilateral nipple shadows as confirmed on recent two-view study. IMPRESSION: COPD without acute superimposed finding.  Discharge Instructions: Discharge Instructions    Call MD for:  difficulty breathing, headache or visual disturbances    Complete by:  As directed   Call MD for:  persistant nausea and vomiting    Complete by:  As directed   Call MD for:  temperature >100.4    Complete by:  As directed   Increase activity slowly    Complete by:  As directed      Signed: Meryl Dare, MD 09/23/2015, 11:19 AM   Pager: [phone]

## 2015-09-24 ENCOUNTER — Telehealth: Payer: Self-pay | Admitting: Pulmonary Disease

## 2015-09-24 ENCOUNTER — Ambulatory Visit: Payer: Medicare Other | Admitting: Pulmonary Disease

## 2015-09-24 DIAGNOSIS — J438 Other emphysema: Secondary | ICD-10-CM

## 2015-09-24 NOTE — Telephone Encounter (Signed)
[phone] had to cancel appt today due to Emergency for [facility] needs appt and her machine asap

## 2015-09-24 NOTE — Telephone Encounter (Signed)
Pt calling wanting to pick up nebulizer machine today from [facility].  Pt is aware that order has been placed.  Message sent to [facility] from [facility] to make her aware.

## 2015-09-24 NOTE — Telephone Encounter (Signed)
LMTCB x1 for pt Order placed to [facility].

## 2015-09-24 NOTE — Telephone Encounter (Signed)
Called spoke with pt. She reports she is needing a new nebulizer machine.  She is scheduled to come in this afternoon at 4:15. She is requesting this be ordered prior to then. Please advise Dr. Halford Chessman thanks  --[facility]

## 2015-09-25 LAB — PATHOLOGIST SMEAR REVIEW: Path Review: REACTIVE

## 2015-09-28 ENCOUNTER — Telehealth: Payer: Self-pay | Admitting: Internal Medicine

## 2015-09-28 NOTE — Telephone Encounter (Signed)
APT. REMINDER CALL, LMTCB °

## 2015-10-01 ENCOUNTER — Ambulatory Visit: Payer: Medicare Other

## 2015-10-03 ENCOUNTER — Ambulatory Visit: Payer: Medicare Other

## 2015-10-03 ENCOUNTER — Encounter: Payer: Self-pay | Admitting: Internal Medicine

## 2015-10-05 ENCOUNTER — Telehealth: Payer: Self-pay | Admitting: Pulmonary Disease

## 2015-10-05 MED ORDER — BENZONATATE 100 MG PO CAPS: 100.0000 mg | ORAL_CAPSULE | Freq: Four times a day (QID) | ORAL | 1 refills | Status: DC | PRN

## 2015-10-05 NOTE — Telephone Encounter (Signed)
Okay to send refill. 

## 2015-10-05 NOTE — Telephone Encounter (Signed)
Called and spoke with pt and she is needing a refill of the benzonatate 100mg  tablets. She stated that she is almost out, has 3 left and this is the only thing that helps with her cough at night.  VS please advise if ok to refill. thanks

## 2015-10-05 NOTE — Telephone Encounter (Signed)
Med has been called to the pharmacy.  I called and lmom to make her aware. Nothing further is needed.

## 2015-10-10 ENCOUNTER — Encounter: Payer: Self-pay | Admitting: Internal Medicine

## 2015-10-10 ENCOUNTER — Ambulatory Visit (INDEPENDENT_AMBULATORY_CARE_PROVIDER_SITE_OTHER): Payer: Medicare Other | Admitting: Internal Medicine

## 2015-10-10 DIAGNOSIS — F17210 Nicotine dependence, cigarettes, uncomplicated: Secondary | ICD-10-CM | POA: Diagnosis not present

## 2015-10-10 DIAGNOSIS — E782 Mixed hyperlipidemia: Secondary | ICD-10-CM

## 2015-10-10 DIAGNOSIS — J449 Chronic obstructive pulmonary disease, unspecified: Secondary | ICD-10-CM

## 2015-10-10 DIAGNOSIS — F411 Generalized anxiety disorder: Secondary | ICD-10-CM | POA: Insufficient documentation

## 2015-10-10 DIAGNOSIS — Z720 Tobacco use: Secondary | ICD-10-CM

## 2015-10-10 DIAGNOSIS — J441 Chronic obstructive pulmonary disease with (acute) exacerbation: Secondary | ICD-10-CM

## 2015-10-10 MED ORDER — ATORVASTATIN CALCIUM 40 MG PO TABS: 40.0000 mg | ORAL_TABLET | Freq: Every day | ORAL | 11 refills | Status: DC

## 2015-10-10 MED ORDER — DULOXETINE HCL 20 MG PO CPEP: 20.0000 mg | ORAL_CAPSULE | Freq: Every day | ORAL | 3 refills | Status: DC

## 2015-10-10 NOTE — Assessment & Plan Note (Signed)
She was seen today at clinic as f/u for her recent hospital admission due to COPD exacerbation. She states that she is feeling better since her discharge. She got a new nebulizer machine and taking her treatments regularly. She has a diagnosis of stage I COPD with low ratio but normal  FEV1 and she follow-up with pulmonary for her COPD. She had a clear chest without any wheezing today. Continue her COPD medicines.

## 2015-10-10 NOTE — Patient Instructions (Signed)
It was pleasure taking care of you today. I am adding a cholesterol medicine today. I am also starting you on Cymbalta for your anxiety and depression,we will increase the dose as needed in the future. F/U in about a month

## 2015-10-10 NOTE — Assessment & Plan Note (Signed)
Her lipid panel done recently during her hospital admission was abnormal. Lipid Panel     Component Value Date/Time   CHOL 305 (H) 09/22/2015 0347   TRIG 241 (H) 09/22/2015 0347   HDL 83 09/22/2015 0347   CHOLHDL 3.7 09/22/2015 0347   VLDL 48 (H) 09/22/2015 0347   LDLCALC 174 (H) 09/22/2015 0347  We started her on Lipitor 40 mg daily.

## 2015-10-10 NOTE — Assessment & Plan Note (Signed)
She still smokes half a pack a day and not ready to quit.

## 2015-10-10 NOTE — Progress Notes (Signed)
   CC: Hospital follow-up after COPD exacerbation.  HPI:  [name] is a 50 y.o.with PMH as listed below was seen today at clinic as f/u for her recent hospital admission due to COPD exacerbation. She states that she is feeling better since her discharge. She got a new nebulizer machine and taking her treatments regularly. She has a diagnosis of stage I COPD with low ratio but normal  FEV1 and she follow-up with pulmonary for her COPD. She was seems pretty anxious and nervous during this visit with continuously moving her legs, when I asked she admits being nervous and having anxiety attacks since childhood due to some bad experiences and some stresses around her. She does states that she is recently more depressed than usual. She has tried Xanax in the past and states that it does help but makes her drowsy too. She has tried Zoloft  but discontinued due to suicidal ideations. She is willing to try another medicine for her depression and anxiety. During this hospital stay, her lipids were abnormal. She had an history of questionable TIA 6 years ago. She still smokes half a pack a day and not ready to quit.  She was offered flu shot today, which she refused stating that flu shot always make her more sick.  Past Medical History:  Diagnosis Date  . Anxiety   . Asthma   . Chronic pain   . Cirrhosis ([place])   . COPD (chronic obstructive pulmonary disease) ([place])   . Depression   . Emphysema   . GERD (gastroesophageal reflux disease)   . Hepatitis B   . Hypertension   . Paroxysmal atrial fibrillation (HCC)   . Pulmonary embolism ([place])   . Stroke ([place])   . Tobacco abuse     Review of Systems:  As per HPI>  Physical Exam:  Vitals:   10/10/15 0957  BP: 137/75  Pulse: (!) 111  Temp: 98.7 F (37.1 C)  TempSrc: Oral  SpO2: 100%  Weight: 128 lb 11.2 oz (58.4 kg)  Height: 5\' 3"  (1.6 m)   General. Well built, well developed, anxious looking woman. NAD. Lungs. Clear bilaterally, no  wheezing CV. RRR, no r/m/g Abdomen.Soft, non tender non distended, BS +ve. Extremities. No edema,no cyanosis. Pulses: 2+ bilaterally  Assessment & Plan:   See Encounters Tab for problem based charting.  Patient seen with Dr. Dareen Piano

## 2015-10-11 NOTE — Progress Notes (Signed)
Internal Medicine Clinic Attending  I saw and evaluated the patient.  I personally confirmed the key portions of the history and exam documented by Dr. Amin and I reviewed pertinent patient test results.  The assessment, diagnosis, and plan were formulated together and I agree with the documentation in the resident's note. 

## 2015-10-12 NOTE — Assessment & Plan Note (Signed)
She was seems pretty anxious and nervous during this visit with continuously moving her legs, when I asked she admits being nervous and having anxiety attacks since childhood due to some bad experiences and some stresses around her. She does states that she is recently more depressed than usual. She has tried Xanax in the past and states that it does help but makes her drowsy too. She has tried Zoloft  but discontinued due to suicidal ideations. She is willing to try another medicine for her depression and anxiety. I started her on Cymbalta 20 mg daily and we will titrate accordingly.

## 2015-10-31 ENCOUNTER — Other Ambulatory Visit: Payer: Self-pay | Admitting: Pulmonary Disease

## 2015-11-06 ENCOUNTER — Other Ambulatory Visit: Payer: Self-pay | Admitting: Pulmonary Disease

## 2015-12-07 ENCOUNTER — Other Ambulatory Visit: Payer: Self-pay | Admitting: Pulmonary Disease

## 2016-01-22 ENCOUNTER — Encounter: Payer: Self-pay | Admitting: Internal Medicine

## 2016-01-22 ENCOUNTER — Ambulatory Visit: Payer: Medicare Other

## 2016-01-24 ENCOUNTER — Telehealth: Payer: Self-pay | Admitting: General Practice

## 2016-01-24 NOTE — Telephone Encounter (Signed)
APT. REMINDER CALL, LMTCB °

## 2016-01-25 ENCOUNTER — Encounter (INDEPENDENT_AMBULATORY_CARE_PROVIDER_SITE_OTHER): Payer: Self-pay

## 2016-01-25 ENCOUNTER — Ambulatory Visit (INDEPENDENT_AMBULATORY_CARE_PROVIDER_SITE_OTHER): Payer: Medicare Other | Admitting: Internal Medicine

## 2016-01-25 VITALS — BP 157/67 | HR 92 | Temp 97.9°F | Ht 63.0 in | Wt 132.5 lb

## 2016-01-25 DIAGNOSIS — K219 Gastro-esophageal reflux disease without esophagitis: Secondary | ICD-10-CM | POA: Diagnosis not present

## 2016-01-25 DIAGNOSIS — R05 Cough: Secondary | ICD-10-CM

## 2016-01-25 DIAGNOSIS — F17210 Nicotine dependence, cigarettes, uncomplicated: Secondary | ICD-10-CM | POA: Diagnosis not present

## 2016-01-25 DIAGNOSIS — R053 Chronic cough: Secondary | ICD-10-CM

## 2016-01-25 NOTE — Patient Instructions (Addendum)
It was a pleasure to meet you [name].  I am sorry you are having the continued cough.  We will try increasing your Prilosec to twice a day. Take this 30-60 minutes before the first and last meals of the day.  Please follow up with your pulmonologist.  If your symptoms continue, we may need to have you see the GI doctors again.  Try to limit caffeine, chocolate, peppermint, fatty foods, and continue to avoid alcohol.  Please try to cut back on smoking.    Steps to Quit Smoking Smoking tobacco can be bad for your health. It can also affect almost every organ in your body. Smoking puts you and people around you at risk for many serious long-lasting (chronic) diseases. Quitting smoking is hard, but it is one of the best things that you can do for your health. It is never too late to quit. What are the benefits of quitting smoking? When you quit smoking, you lower your risk for getting serious diseases and conditions. They can include:  Lung cancer or lung disease.  Heart disease.  Stroke.  Heart attack.  Not being able to have children (infertility).  Weak bones (osteoporosis) and broken bones (fractures). If you have coughing, wheezing, and shortness of breath, those symptoms may get better when you quit. You may also get sick less often. If you are pregnant, quitting smoking can help to lower your chances of having a baby of low birth weight. What can I do to help me quit smoking? Talk with your doctor about what can help you quit smoking. Some things you can do (strategies) include:  Quitting smoking totally, instead of slowly cutting back how much you smoke over a period of time.  Going to in-person counseling. You are more likely to quit if you go to many counseling sessions.  Using resources and support systems, such as:  Online chats with a Social worker.  Phone quitlines.  Printed Furniture conservator/restorer.  Support groups or group counseling.  Text messaging  programs.  Mobile phone apps or applications.  Taking medicines. Some of these medicines may have nicotine in them. If you are pregnant or breastfeeding, do not take any medicines to quit smoking unless your doctor says it is okay. Talk with your doctor about counseling or other things that can help you. Talk with your doctor about using more than one strategy at the same time, such as taking medicines while you are also going to in-person counseling. This can help make quitting easier. What things can I do to make it easier to quit? Quitting smoking might feel very hard at first, but there is a lot that you can do to make it easier. Take these steps:  Talk to your family and friends. Ask them to support and encourage you.  Call phone quitlines, reach out to support groups, or work with a Social worker.  Ask people who smoke to not smoke around you.  Avoid places that make you want (trigger) to smoke, such as:  Bars.  Parties.  Smoke-break areas at work.  Spend time with people who do not smoke.  Lower the stress in your life. Stress can make you want to smoke. Try these things to help your stress:  Getting regular exercise.  Deep-breathing exercises.  Yoga.  Meditating.  Doing a body scan. To do this, close your eyes, focus on one area of your body at a time from head to toe, and notice which parts of your body are tense.  Try to relax the muscles in those areas.  Download or buy apps on your mobile phone or tablet that can help you stick to your quit plan. There are many free apps, such as QuitGuide from the [employer] Office manager for Disease Control and Prevention). You can find more support from smokefree.gov and other websites. This information is not intended to replace advice given to you by your health care provider. Make sure you discuss any questions you have with your health care provider. Document Released: 11/16/2008 Document Revised: 09/18/2015 Document Reviewed:  06/06/2014 Elsevier Interactive Patient Education  2017 [employer].

## 2016-01-25 NOTE — Progress Notes (Signed)
   CC: Chronic cough  HPI:  Kimberly Kemp is a 50 y.o. female with PMH as listed below who presents for management of her chronic cough.  Chronic cough: Patient reports her cough began years ago. She feels it has gotten worse about 2 months ago. It is a daily occurrence, throughout the day, worse at night. She says it is mostly a dry cough but has occasional blood tinged sputum during coughing fits at night. She has tried Tessalon, Delsym, Mucinex, Tussinex, and other OTC cough suppressants without relief. She denies any recent rhinorrhea, congestion, or allergy type symptoms. Cough is not related to meals. She reports heartburn and takes Prilosec 40 mg once daily in the morning when she wakes up. CXR in August 2017 showed changes of COPD without acute disease. CT chest w/ contrast in 2015 showed minimal stable emphysematous disease w/o acute findings. A biopsy of the right upper lobe of the lung in March 2008 showed benign bronchial and alveolar tissue with mild chronic inflammation. She does report occasional feelings of food getting stuck in her throat since several years ago. EGD in 2014 showed chronic inflammatory changes of squamous and columnar cells without changes of Barrett's esophagus or malignancy. She does continue to smoke about 0.5 PPD. She reports caffeine use with coffee and sodas. She denies alcohol use.  Past Medical History:  Diagnosis Date  . Anxiety   . Asthma   . Chronic pain   . Cirrhosis ([place])   . COPD (chronic obstructive pulmonary disease) ([place])   . Depression   . Emphysema   . GERD (gastroesophageal reflux disease)   . Hepatitis B   . Hypertension   . Paroxysmal atrial fibrillation (HCC)   . Pulmonary embolism ([place])   . Stroke ([place])   . Tobacco abuse     Review of Systems:   Review of Systems  Constitutional: Negative for chills, diaphoresis and fever.  HENT: Negative for congestion, ear discharge and sinus pain.        No rhionorrhea or epiphora    Respiratory: Positive for cough. Negative for sputum production.        Occasional bloody sputum with coughing fits  Cardiovascular: Negative for chest pain.  Gastrointestinal: Positive for heartburn. Negative for blood in stool, melena, nausea and vomiting.     Physical Exam:  Vitals:   01/25/16 1449  BP: (!) 157/67  Pulse: 92  Temp: 97.9 F (36.6 C)  TempSrc: Oral  SpO2: 100%  Weight: 132 lb 8 oz (60.1 kg)  Height: 5\' 3"  (1.6 m)   Physical Exam  Constitutional: She is oriented to person, place, and time. No distress.  Thin woman  HENT:  Mouth/Throat: Oropharynx is clear and moist.  Cardiovascular: Normal rate and regular rhythm.   Pulmonary/Chest: Effort normal. No respiratory distress. She has no wheezes. She has no rales.  Neurological: She is alert and oriented to person, place, and time.  Skin: She is not diaphoretic.    Assessment & Plan:   See Encounters Tab for problem based charting.  Patient discussed with Dr. Daryll Drown

## 2016-01-25 NOTE — Assessment & Plan Note (Signed)
Patient reports her cough began years ago. She feels it has gotten worse about 2 months ago. It is a daily occurrence, throughout the day, worse at night. She says it is mostly a dry cough but has occasional blood tinged sputum during coughing fits at night. She has tried Tessalon, Delsym, Mucinex, Tussinex, and other OTC cough suppressants without relief. She denies any recent rhinorrhea, congestion, or allergy type symptoms. Cough is not related to meals. She reports heartburn and takes Prilosec 40 mg once daily in the morning when she wakes up. CXR in August 2017 showed changes of COPD without acute disease. CT chest w/ contrast in 2015 showed minimal stable emphysematous disease w/o acute findings. A biopsy of the right upper lobe of the lung in March 2008 showed benign bronchial and alveolar tissue with mild chronic inflammation. She does report occasional feelings of food getting stuck in her throat since several years ago. EGD in 2014 showed chronic inflammatory changes of squamous and columnar cells without changes of Barrett's esophagus or malignancy. She does continue to smoke about 0.5 PPD. She reports caffeine use with coffee and sodas. She denies alcohol use.   Patient with chronic cough without relief from multiple medications. Her smoking and GERD are likely contributing. Patient advised to increase her Prilosec to twice a day and avoid trigger foods for her GERD. She is counseled on smoking cessation. She will follow up with pulmonology as well. If symptoms persist, I would consider referral back to GI or ENT for further evaluation.

## 2016-01-30 NOTE — Progress Notes (Signed)
Internal Medicine Clinic Attending  Case discussed with Dr. Patel,Vishal soon after the resident saw the patient.  We reviewed the resident's history and exam and pertinent patient test results.  I agree with the assessment, diagnosis, and plan of care documented in the resident's note. 

## 2016-02-01 ENCOUNTER — Telehealth: Payer: Self-pay | Admitting: Pulmonary Disease

## 2016-02-01 NOTE — Telephone Encounter (Signed)
Sending to DOD to address. AD please advise.  Thanks!

## 2016-02-01 NOTE — Telephone Encounter (Signed)
Spoke with pt, c/o worsening intermittent fever, headaches, body aches, chills, prod cough with dark [place] mucus mixed with bright red blood.   Pt has been taking robitussin DM to help suppress cough, as well as mucinex DM, and excedrin tension headache.  Pt went to PCP on 12/22 about this, was directed to follow up with our office.    S/s present X2 mos.    Pt uses [facility] on [address].    VS please advise on recs.  Thanks!

## 2016-02-01 NOTE — Telephone Encounter (Signed)
   Based on sx, she sounds she is sick and I'm not sure why she is calling on a Friday afternoon if the symptoms have been going on for 2 months. Most likely she has an acute infection on top of a chronic infection. I think we need to rule out influenza. Please tell the patient to go to the ER and have herself checked out for influenza.

## 2016-02-01 NOTE — Telephone Encounter (Signed)
Spoke with the pt and notified of recs per AD  She verbalized understanding  I advised next time she starts feeling bad she should call sooner rather than letting her symptoms persist and worsen  She verbalized understanding  Will go to ED as rec I will forward to VS to make him aware

## 2016-02-02 NOTE — Telephone Encounter (Signed)
Noted  

## 2016-03-03 ENCOUNTER — Other Ambulatory Visit: Payer: Self-pay | Admitting: Internal Medicine

## 2016-03-10 ENCOUNTER — Ambulatory Visit: Payer: Medicare Other

## 2016-03-10 ENCOUNTER — Encounter: Payer: Self-pay | Admitting: Internal Medicine

## 2016-03-10 ENCOUNTER — Emergency Department
Admission: EM | Admit: 2016-03-10 | Discharge: 2016-03-10 | Disposition: A | Discharge status: Home / Self Care | Payer: Medicare Other | Attending: Emergency Medicine | Admitting: Emergency Medicine

## 2016-03-10 DIAGNOSIS — F17210 Nicotine dependence, cigarettes, uncomplicated: Secondary | ICD-10-CM | POA: Diagnosis not present

## 2016-03-10 DIAGNOSIS — J45909 Unspecified asthma, uncomplicated: Secondary | ICD-10-CM | POA: Diagnosis not present

## 2016-03-10 DIAGNOSIS — Z79899 Other long term (current) drug therapy: Secondary | ICD-10-CM | POA: Diagnosis not present

## 2016-03-10 DIAGNOSIS — I10 Essential (primary) hypertension: Secondary | ICD-10-CM | POA: Diagnosis not present

## 2016-03-10 DIAGNOSIS — K529 Noninfective gastroenteritis and colitis, unspecified: Secondary | ICD-10-CM | POA: Diagnosis not present

## 2016-03-10 DIAGNOSIS — J449 Chronic obstructive pulmonary disease, unspecified: Secondary | ICD-10-CM | POA: Insufficient documentation

## 2016-03-10 DIAGNOSIS — R197 Diarrhea, unspecified: Secondary | ICD-10-CM | POA: Diagnosis present

## 2016-03-10 LAB — CBC
HEMATOCRIT: 41 % (ref 35.0–47.0)
HEMOGLOBIN: 13.7 g/dL (ref 12.0–16.0)
MCH: 29.2 pg (ref 26.0–34.0)
MCHC: 33.5 g/dL (ref 32.0–36.0)
MCV: 87.3 fL (ref 80.0–100.0)
Platelets: 249 10*3/uL (ref 150–440)
RBC: 4.7 MIL/uL (ref 3.80–5.20)
RDW: 15.5 % — ABNORMAL HIGH (ref 11.5–14.5)
WBC: 5.1 10*3/uL (ref 3.6–11.0)

## 2016-03-10 LAB — COMPREHENSIVE METABOLIC PANEL
ALK PHOS: 59 U/L (ref 38–126)
ALT: 14 U/L (ref 14–54)
AST: 19 U/L (ref 15–41)
Albumin: 4.3 g/dL (ref 3.5–5.0)
Anion gap: 10 (ref 5–15)
BILIRUBIN TOTAL: 0.6 mg/dL (ref 0.3–1.2)
BUN: 16 mg/dL (ref 6–20)
CALCIUM: 9.1 mg/dL (ref 8.9–10.3)
CO2: 18 mmol/L — AB (ref 22–32)
CREATININE: 0.88 mg/dL (ref 0.44–1.00)
Chloride: 110 mmol/L (ref 101–111)
GFR calc non Af Amer: >60 mL/min (ref >60)
Glucose, Bld: 98 mg/dL (ref 65–99)
Potassium: 3.8 mmol/L (ref 3.5–5.1)
SODIUM: 138 mmol/L (ref 135–145)
TOTAL PROTEIN: 7.9 g/dL (ref 6.5–8.1)

## 2016-03-10 LAB — LIPASE, BLOOD: Lipase: 23 U/L (ref 11–51)

## 2016-03-10 MED ORDER — SODIUM CHLORIDE 0.9 % IV BOLUS (SEPSIS)
1000.0000 mL | Freq: Once | INTRAVENOUS | Status: AC
  Administered 2016-03-10: 1000 mL via INTRAVENOUS

## 2016-03-10 MED ORDER — ONDANSETRON HCL 4 MG/2ML IJ SOLN
4.0000 mg | Freq: Once | INTRAMUSCULAR | Status: AC
  Administered 2016-03-10: 4 mg via INTRAVENOUS
  Filled 2016-03-10: qty 2

## 2016-03-10 MED ORDER — ONDANSETRON HCL 4 MG PO TABS: 4.0000 mg | ORAL_TABLET | Freq: Every day | ORAL | 0 refills | Status: AC | PRN

## 2016-03-10 NOTE — ED Notes (Signed)
Pt discharged to home.  Family member driving.  Discharge instructions reviewed.  Verbalized understanding.  No questions or concerns at this time.  Teach back verified.  Pt in NAD.  No items left in ED.   

## 2016-03-10 NOTE — ED Triage Notes (Signed)
Pt started with vomiting and diarrhea on Saturday - pt denies any other symptoms - states her grandchildren were sick with same virus last week

## 2016-03-10 NOTE — ED Provider Notes (Signed)
[facility] Emergency Department Provider Note  ____________________________________________  Time seen: Approximately 8:13 PM  I have reviewed the triage vital signs and the nursing notes.   HISTORY  Chief Complaint Emesis and Diarrhea    HPI Kimberly Kemp is a 51 y.o. female that presents to the emergency department with 2 days of nausea, vomiting, diarrhea. Patient states the grandchildren were sick last week with similar symptoms and their symptoms lasted 24 hours. She states that she is still drinking fluids but is not eating well. Patient has not taken anything for symptoms. Patient denies fever, shortness breath, chest pain, back pain.   Past Medical History:  Diagnosis Date  . Anxiety   . Asthma   . Chronic pain   . Cirrhosis ([place])   . COPD (chronic obstructive pulmonary disease) ([place])   . Depression   . Emphysema   . GERD (gastroesophageal reflux disease)   . Hepatitis B   . Hypertension   . Paroxysmal atrial fibrillation (HCC)   . Pulmonary embolism ([place])   . Stroke ([place])   . Tobacco abuse     Patient Active Problem List   Diagnosis Date Noted  . Generalized anxiety disorder 10/10/2015  . COPD exacerbation ([place]) 09/22/2015  . Essential hypertension 09/22/2015  . History of stroke 09/22/2015  . Leukocytosis 09/22/2015  . Acute respiratory failure ([place])   . Mixed hyperlipidemia   . Acute exacerbation of chronic obstructive pulmonary disease (COPD) ([place]) 09/04/2015  . Acute bronchitis 04/04/2015  . Hemoptysis 06/30/2014  . Thrush 07/22/2013  . Chest pain 04/27/2013  . GERD (gastroesophageal reflux disease) 06/18/2012  . Chronic cough 06/18/2012  . EMPHYSEMA 05/27/2007  . Tobacco abuse 12/11/2006    Past Surgical History:  Procedure Laterality Date  . APPENDECTOMY     microscopic   . BRONCHOSCOPY  March 2008, Feb 2012  . CESAREAN SECTION      Prior to Admission medications   Medication Sig Start Date End Date Taking?  Authorizing Provider  albuterol (PROAIR HFA) 108 (90 BASE) MCG/ACT inhaler INHALE 2 PUFFS INTO THE LUNGS EVERY FOUR HOURS AS NEEDED FOR WHEEZING 01/04/15   Chesley Mires, MD  albuterol (PROVENTIL) (2.5 MG/3ML) 0.083% nebulizer solution INHALE 1 VIAL EVERY 6 HOURS AS NEEDED 11/08/15   Chesley Mires, MD  arformoterol (BROVANA) 15 MCG/2ML NEBU Take 2 mLs (15 mcg total) by nebulization 2 (two) times daily. Dx 492.8 10/03/14   Chesley Mires, MD  atorvastatin (LIPITOR) 40 MG tablet Take 1 tablet (40 mg total) by mouth daily. 10/10/15 10/09/16  Lorella Nimrod, MD  benzonatate (TESSALON) 100 MG capsule TAKE 1 CAPSULE EVERY 6 HOURS AS NEEDED COUGH 12/14/15   Chesley Mires, MD  budesonide (PULMICORT) 0.25 MG/2ML nebulizer solution USE 1 VIAL PER NEBULIZER TWICE A DAY 01/04/15   Chesley Mires, MD  dextromethorphan-guaiFENesin (MUCINEX DM) 30-600 MG per 12 hr tablet Take 1 tablet by mouth every 12 (twelve) hours.      Historical Provider, MD  diclofenac (CATAFLAM) 50 MG tablet Take 1 tablet (50 mg total) by mouth 3 (three) times daily. One tablet TID with food prn pain. 07/16/15   Janne Napoleon, NP  DULoxetine (CYMBALTA) 20 MG capsule TAKE ONE CAPSULE BY MOUTH EVERY DAY 03/03/16   Lorella Nimrod, MD  ipratropium (ATROVENT) 0.02 % nebulizer solution USE 1.25 ML (1/2 VIAL) EVERY 4 HOURS AS NEEDED FOR WHEEZING.Marland KitchenMarland KitchenDX 492.8 04/11/15   Chesley Mires, MD  omeprazole (PRILOSEC) 40 MG capsule TAKE 1 CAPSULE (40 MG TOTAL) BY MOUTH  DAILY. 10/31/15   Chesley Mires, MD  ondansetron (ZOFRAN) 4 MG tablet Take 1 tablet (4 mg total) by mouth daily as needed for nausea or vomiting. 03/10/16 03/10/17  Laban Emperor, PA-C  predniSONE (DELTASONE) 20 MG tablet Take 2 tablets (40 mg total) by mouth daily with breakfast. 09/23/15   Riccardo Dubin, MD  zolpidem (AMBIEN) 5 MG tablet Take 5 mg by mouth at bedtime as needed for sleep.     Historical Provider, MD    Allergies Penicillins  Family History  Problem Relation Age of Onset  . Emphysema Father   . Prostate cancer  Father   . Liver disease Father   . Emphysema Maternal Grandmother   . Diabetes Mother   . Heart disease Mother   . Colon cancer Neg Hx     Social History Social History  Substance Use Topics  . Smoking status: Current Every Day Smoker    Packs/day: 0.50    Years: 43.00    Types: Cigarettes  . Smokeless tobacco: Never Used     Comment: cutting back  . Alcohol use No     Review of Systems  Constitutional: No fever/chills ENT: No upper respiratory complaints. Cardiovascular: No chest pain. Respiratory: No cough. No SOB. Musculoskeletal: Negative for musculoskeletal pain. Skin: Negative for rash, abrasions, lacerations, ecchymosis. Neurological: Negative for headaches, numbness or tingling   ____________________________________________   PHYSICAL EXAM:  VITAL SIGNS: ED Triage Vitals  Enc Vitals Group     BP 03/10/16 1833 (!) 145/88     Pulse Rate 03/10/16 1833 (!) 112     Resp 03/10/16 1833 16     Temp 03/10/16 1833 98.3 F (36.8 C)     Temp Source 03/10/16 1833 Oral     SpO2 03/10/16 1833 100 %     Weight 03/10/16 1834 124 lb (56.2 kg)     Height 03/10/16 1834 5\' 3"  (1.6 m)     Head Circumference --      Peak Flow --      Pain Score 03/10/16 1842 10     Pain Loc --      Pain Edu? --      Excl. in [place]? --      Constitutional: Alert and oriented. Well appearing and in no acute distress. Eyes: Conjunctivae are normal. PERRL. EOMI. Head: Atraumatic. ENT:      Ears:      Nose: No congestion/rhinnorhea.      Mouth/Throat: Mucous membranes are moist.  Neck: No stridor.   Cardiovascular: Normal rate, regular rhythm.  Good peripheral circulation. Respiratory: Normal respiratory effort without tachypnea or retractions. Lungs CTAB. Good air entry to the bases with no decreased or absent breath sounds. Gastrointestinal: Bowel sounds 4 quadrants. Soft and nontender to palpation. No guarding or rigidity. No palpable masses. No distention. Musculoskeletal: Full  range of motion to all extremities. No gross deformities appreciated. Neurologic:  Normal speech and language. No gross focal neurologic deficits are appreciated.  Skin:  Skin is warm, dry and intact. No rash noted. Psychiatric: Mood and affect are normal. Speech and behavior are normal. Patient exhibits appropriate insight and judgement.   ____________________________________________   LABS (all labs ordered are listed, but only abnormal results are displayed)  Labs Reviewed  CBC - Abnormal; Notable for the following:       Result Value   RDW 15.5 (*)    All other components within normal limits  COMPREHENSIVE METABOLIC PANEL - Abnormal; Notable for the following:  CO2 18 (*)    All other components within normal limits  LIPASE, BLOOD   ____________________________________________  EKG   ____________________________________________  RADIOLOGY  No results found.  ____________________________________________    PROCEDURES  Procedure(s) performed:    Procedures    Medications  sodium chloride 0.9 % bolus 1,000 mL (0 mLs Intravenous Stopped 03/10/16 2146)  ondansetron (ZOFRAN) injection 4 mg (4 mg Intravenous Given 03/10/16 2004)     ____________________________________________   INITIAL IMPRESSION / ASSESSMENT AND PLAN / ED COURSE  Pertinent labs & imaging results that were available during my care of the patient were reviewed by me and considered in my medical decision making (see chart for details).  Review of the [place] CSRS was performed in accordance of the [place] prior to dispensing any controlled drugs.     Patient's diagnosis is consistent with Gastroenteritis. Vital signs, exam, lab work are reassuring. Patient was given fluids and Zofran in emergency department. After fluids and medication patient states that "she is tickled at how good she feels now." Patient states that she is "ready to go home and take on the grandchildren." Patient will be discharged  home with prescriptions for Zofran. Patient is to follow up with PCP as directed. Patient is given ED precautions to return to the ED for any worsening or new symptoms.  ____________________________________________  FINAL CLINICAL IMPRESSION(S) / ED DIAGNOSES  Final diagnoses:  Gastroenteritis      NEW MEDICATIONS STARTED DURING THIS VISIT:  Discharge Medication List as of 03/10/2016  9:31 PM    START taking these medications   Details  ondansetron (ZOFRAN) 4 MG tablet Take 1 tablet (4 mg total) by mouth daily as needed for nausea or vomiting., Starting Mon 03/10/2016, Until Tue 03/10/2017, Print            This chart was dictated using voice recognition software/Dragon. Despite best efforts to proofread, errors can occur which can change the meaning. Any change was purely unintentional.     Laban Emperor, PA-C 03/10/16 [place], MD 03/10/16 [phone]

## 2016-03-14 ENCOUNTER — Encounter: Payer: Self-pay | Admitting: Internal Medicine

## 2016-03-27 ENCOUNTER — Telehealth: Payer: Self-pay | Admitting: Internal Medicine

## 2016-03-27 NOTE — Telephone Encounter (Signed)
Receipt for certified letter was returned today signed by Della Goo for patient.  Forwarding to Dr. Lynnae January.

## 2016-03-29 ENCOUNTER — Encounter: Payer: Self-pay | Admitting: Emergency Medicine

## 2016-03-29 ENCOUNTER — Emergency Department
Admission: EM | Admit: 2016-03-29 | Discharge: 2016-03-30 | Disposition: A | Discharge status: Home / Self Care | Payer: Medicare Other | Attending: Emergency Medicine | Admitting: Emergency Medicine

## 2016-03-29 ENCOUNTER — Emergency Department: Payer: Medicare Other

## 2016-03-29 DIAGNOSIS — I10 Essential (primary) hypertension: Secondary | ICD-10-CM | POA: Diagnosis not present

## 2016-03-29 DIAGNOSIS — M79642 Pain in left hand: Secondary | ICD-10-CM | POA: Diagnosis present

## 2016-03-29 DIAGNOSIS — M1812 Unilateral primary osteoarthritis of first carpometacarpal joint, left hand: Secondary | ICD-10-CM | POA: Diagnosis not present

## 2016-03-29 DIAGNOSIS — J45909 Unspecified asthma, uncomplicated: Secondary | ICD-10-CM | POA: Diagnosis not present

## 2016-03-29 DIAGNOSIS — J449 Chronic obstructive pulmonary disease, unspecified: Secondary | ICD-10-CM | POA: Insufficient documentation

## 2016-03-29 DIAGNOSIS — F17210 Nicotine dependence, cigarettes, uncomplicated: Secondary | ICD-10-CM | POA: Insufficient documentation

## 2016-03-29 NOTE — ED Provider Notes (Signed)
[facility] Emergency Department Provider Note ____________________________________________  Time seen: Approximately 10:46 PM  I have reviewed the triage vital signs and the nursing notes.   HISTORY  Chief Complaint Hand Pain    HPI Kimberly Kemp is a 51 y.o. female who presents to the emergency department for evaluation of left thumb pain. She states that approximately a month ago she was lifting something heavy and began to have pain. She states that today the pain has become worse.She has taken ibuprofen with some relief, but if she accidentally hits the thumb on something it is very painful.  Past Medical History:  Diagnosis Date  . Anxiety   . Asthma   . Chronic pain   . Cirrhosis ([place])   . COPD (chronic obstructive pulmonary disease) ([place])   . Depression   . Emphysema   . GERD (gastroesophageal reflux disease)   . Hepatitis B   . Hypertension   . Paroxysmal atrial fibrillation (HCC)   . Pulmonary embolism ([place])   . Stroke ([place])   . Tobacco abuse     Patient Active Problem List   Diagnosis Date Noted  . Generalized anxiety disorder 10/10/2015  . COPD exacerbation ([place]) 09/22/2015  . Essential hypertension 09/22/2015  . History of stroke 09/22/2015  . Leukocytosis 09/22/2015  . Acute respiratory failure ([place])   . Mixed hyperlipidemia   . Acute exacerbation of chronic obstructive pulmonary disease (COPD) ([place]) 09/04/2015  . Acute bronchitis 04/04/2015  . Hemoptysis 06/30/2014  . Thrush 07/22/2013  . Chest pain 04/27/2013  . GERD (gastroesophageal reflux disease) 06/18/2012  . Chronic cough 06/18/2012  . EMPHYSEMA 05/27/2007  . Tobacco abuse 12/11/2006    Past Surgical History:  Procedure Laterality Date  . APPENDECTOMY     microscopic   . BRONCHOSCOPY  March 2008, Feb 2012  . CESAREAN SECTION      Prior to Admission medications   Medication Sig Start Date End Date Taking? Authorizing Provider  albuterol (PROAIR HFA) 108 (90  BASE) MCG/ACT inhaler INHALE 2 PUFFS INTO THE LUNGS EVERY FOUR HOURS AS NEEDED FOR WHEEZING 01/04/15   Chesley Mires, MD  albuterol (PROVENTIL) (2.5 MG/3ML) 0.083% nebulizer solution INHALE 1 VIAL EVERY 6 HOURS AS NEEDED 11/08/15   Chesley Mires, MD  arformoterol (BROVANA) 15 MCG/2ML NEBU Take 2 mLs (15 mcg total) by nebulization 2 (two) times daily. Dx 492.8 10/03/14   Chesley Mires, MD  atorvastatin (LIPITOR) 40 MG tablet Take 1 tablet (40 mg total) by mouth daily. 10/10/15 10/09/16  Lorella Nimrod, MD  benzonatate (TESSALON) 100 MG capsule TAKE 1 CAPSULE EVERY 6 HOURS AS NEEDED COUGH 12/14/15   Chesley Mires, MD  budesonide (PULMICORT) 0.25 MG/2ML nebulizer solution USE 1 VIAL PER NEBULIZER TWICE A DAY 01/04/15   Chesley Mires, MD  dextromethorphan-guaiFENesin (MUCINEX DM) 30-600 MG per 12 hr tablet Take 1 tablet by mouth every 12 (twelve) hours.      Historical Provider, MD  diclofenac (CATAFLAM) 50 MG tablet Take 1 tablet (50 mg total) by mouth 3 (three) times daily. One tablet TID with food prn pain. 07/16/15   Janne Napoleon, NP  DULoxetine (CYMBALTA) 20 MG capsule TAKE ONE CAPSULE BY MOUTH EVERY DAY 03/03/16   Lorella Nimrod, MD  HYDROcodone-acetaminophen (NORCO/VICODIN) 5-325 MG tablet Take 1 tablet by mouth every 6 (six) hours as needed for moderate pain. 03/30/16 04/02/16  Victorino Dike, FNP  ipratropium (ATROVENT) 0.02 % nebulizer solution USE 1.25 ML (1/2 VIAL) EVERY 4 HOURS AS NEEDED FOR  WHEEZING.Marland KitchenMarland KitchenDX 492.8 04/11/15   Chesley Mires, MD  meloxicam (MOBIC) 15 MG tablet Take 1 tablet (15 mg total) by mouth daily. 03/30/16   Victorino Dike, FNP  omeprazole (PRILOSEC) 40 MG capsule TAKE 1 CAPSULE (40 MG TOTAL) BY MOUTH DAILY. 10/31/15   Chesley Mires, MD  ondansetron (ZOFRAN) 4 MG tablet Take 1 tablet (4 mg total) by mouth daily as needed for nausea or vomiting. 03/10/16 03/10/17  Laban Emperor, PA-C  predniSONE (DELTASONE) 20 MG tablet Take 2 tablets (40 mg total) by mouth daily with breakfast. 09/23/15   Riccardo Dubin, MD   zolpidem (AMBIEN) 5 MG tablet Take 5 mg by mouth at bedtime as needed for sleep.     Historical Provider, MD    Allergies Penicillins  Family History  Problem Relation Age of Onset  . Emphysema Father   . Prostate cancer Father   . Liver disease Father   . Emphysema Maternal Grandmother   . Diabetes Mother   . Heart disease Mother   . Colon cancer Neg Hx     Social History Social History  Substance Use Topics  . Smoking status: Current Every Day Smoker    Packs/day: 0.50    Years: 43.00    Types: Cigarettes  . Smokeless tobacco: Never Used     Comment: cutting back  . Alcohol use No    Review of Systems Constitutional: No recent illness. Cardiovascular: Denies chest pain or palpitations. Respiratory: Denies shortness of breath. Musculoskeletal: Pain in left thumb. Skin: Negative for rash, wound, lesion. Neurological: Negative for focal weakness or numbness.  ____________________________________________   PHYSICAL EXAM:  VITAL SIGNS: ED Triage Vitals  Enc Vitals Group     BP 03/29/16 2228 (!) 154/88     Pulse Rate 03/29/16 2228 80     Resp 03/29/16 2228 16     Temp 03/29/16 2228 98.9 F (37.2 C)     Temp Source 03/29/16 2228 Oral     SpO2 03/29/16 2228 100 %     Weight 03/29/16 2228 125 lb (56.7 kg)     Height 03/29/16 2228 5\' 3"  (1.6 m)     Head Circumference --      Peak Flow --      Pain Score 03/29/16 2232 9     Pain Loc --      Pain Edu? --      Excl. in [place]? --     Constitutional: Alert and oriented. Well appearing and in no acute distress. Eyes: Conjunctivae are normal. EOMI. Head: Atraumatic. Neck: No stridor.  Respiratory: Normal respiratory effort.   Musculoskeletal: Tenderness in the MCP of the left thumb without deformity, erythema, or edema.  Neurologic:  Normal speech and language. No gross focal neurologic deficits are appreciated. Speech is normal. No gait instability. Skin:  Skin is warm, dry and intact. Atraumatic. Psychiatric:  Mood and affect are normal. Speech and behavior are normal.  ____________________________________________   LABS (all labs ordered are listed, but only abnormal results are displayed)  Labs Reviewed - No data to display ____________________________________________  RADIOLOGY  Spurring at the head of the metacarpal of the left thumb, otherwise no bony abnormality per radiology. ____________________________________________   PROCEDURES  Procedure(s) performed: None   ____________________________________________   INITIAL IMPRESSION / ASSESSMENT AND PLAN / ED COURSE     Pertinent labs & imaging results that were available during my care of the patient were reviewed by me and considered in my medical decision making (see chart  for details).  51 year old female presenting to the emergency department for evaluation of the left thumb pain. X-ray showing small bone spur in location of patient's pain on exam. She was instructed to follow up with the primary care provider of her choice for symptoms that are not improving over the next couple weeks. She was given a prescription for meloxicam. She was instructed to return to the emergency department for symptoms that change or worsen if she is unable schedule an appointment. ____________________________________________   FINAL CLINICAL IMPRESSION(S) / ED DIAGNOSES  Final diagnoses:  Primary osteoarthritis of first carpometacarpal joint of left hand       Victorino Dike, FNP 03/30/16 0030    Earleen Newport, MD 03/30/16 [phone]

## 2016-03-29 NOTE — ED Triage Notes (Signed)
Patient states that she was doing some heavy lifting about a month ago and started having pain to her left thumb. Patient states that today the pain has become worse.

## 2016-03-29 NOTE — ED Notes (Signed)
Pt states pain in left thumb for greater than one month. No swelling noted. Pain is at the base of thumb

## 2016-03-30 MED ORDER — HYDROCODONE-ACETAMINOPHEN 5-325 MG PO TABS: 1.0000 | ORAL_TABLET | Freq: Four times a day (QID) | ORAL | 0 refills | Status: AC | PRN

## 2016-03-30 MED ORDER — MELOXICAM 15 MG PO TABS: 15.0000 mg | ORAL_TABLET | Freq: Every day | ORAL | 0 refills | Status: DC

## 2016-04-07 ENCOUNTER — Telehealth: Payer: Self-pay | Admitting: Pulmonary Disease

## 2016-04-07 NOTE — Telephone Encounter (Signed)
Called the number that was provided in the original message, a female answered the line and told me I had the wrong number. Called pt's cell phone number, lmtcb x1.

## 2016-04-08 NOTE — Telephone Encounter (Signed)
LM x 1 for pt on cell #

## 2016-04-09 NOTE — Telephone Encounter (Signed)
lmtcb X3 for pt. 

## 2016-04-10 NOTE — Telephone Encounter (Signed)
We have attempted to contact the pt on several occassions with no success or call back. Per triage protocol, message will be closed.

## 2016-04-15 ENCOUNTER — Ambulatory Visit: Payer: Medicare Other | Admitting: Pulmonary Disease

## 2016-05-06 ENCOUNTER — Other Ambulatory Visit: Payer: Self-pay | Admitting: Pulmonary Disease

## 2016-05-14 ENCOUNTER — Telehealth: Payer: Self-pay | Admitting: Pulmonary Disease

## 2016-05-14 NOTE — Telephone Encounter (Signed)
VS  Please Advise-  Pt called in c/o slight cough during the day, but cough is really bad at night that keeps her up and she notices some blood mixed in the phlegm, breathing is ok but does get sob w/ exertion at times. Denies fever. She wants to know if we could refill her benzonatate.

## 2016-05-15 MED ORDER — BENZONATATE 100 MG PO CAPS: ORAL_CAPSULE | ORAL | 0 refills | Status: DC

## 2016-05-15 NOTE — Addendum Note (Signed)
Addended by: Len Blalock on: 05/15/2016 09:16 AM   Modules accepted: Orders

## 2016-05-15 NOTE — Telephone Encounter (Signed)
Pt aware of recs.  Declined to schedule rov at this time.  Stressed the importance of a rov for future refills.  Pt expressed understanding.  Nothing further needed.

## 2016-05-15 NOTE — Telephone Encounter (Signed)
Can send in refill for benzonatate this time, but she is overdue for ROV.

## 2016-06-09 ENCOUNTER — Other Ambulatory Visit: Payer: Self-pay

## 2016-06-09 NOTE — Patient Outreach (Signed)
[facility]) Care Management  06/09/2016  KAILANA BENNINGER October 01, 1965 [id number]    Medication Adherence to Mrs. Juanell Fairly  Call  Mrs Goldfarb because she is behind on her atorvastatin 40 mg she said she is not pick up her medication because of cost,I will refer to [place] the pharmacist for better help with medication cost.    [facility] Direct Dial [phone]  Fax [phone] [email or web address]

## 2016-06-10 ENCOUNTER — Telehealth: Payer: Self-pay | Admitting: Pharmacist

## 2016-06-10 ENCOUNTER — Encounter: Payer: Self-pay | Admitting: Pharmacist

## 2016-06-10 NOTE — Patient Outreach (Signed)
[facility]) Care Management  [place]   06/10/2016  Kimberly Kemp [birth year] [id number]  Subjective: Patient was called regarding medication adherence to atorvastatin per referral from Kimberly Kemp, Kimberly Kemp.  HIPAA identifiers were obtained.  Patient is a 51 year old female with multiple medical problems including but not limited to: hypertension, COPD, history of tobacco abuse, hyperlipidemia, generalized anxiety disorder and GERD.  Patient reported she has difficulty paying for her medications sometimes because she is helping to take care of her granddaughter and has a fixed income.   Objective:   Encounter Medications: Outpatient Encounter Prescriptions as of 06/10/2016  Medication Sig Note  . albuterol (PROAIR HFA) 108 (90 BASE) MCG/ACT inhaler INHALE 2 PUFFS INTO THE LUNGS EVERY FOUR HOURS AS NEEDED FOR WHEEZING   . albuterol (PROVENTIL) (2.5 MG/3ML) 0.083% nebulizer solution INHALE 1 VIAL EVERY 6 HOURS AS NEEDED   . atorvastatin (LIPITOR) 40 MG tablet Take 1 tablet (40 mg total) by mouth daily. 06/10/2016: Patient reported it causes her heart rate to drop low and then she has chest pains  . benzonatate (TESSALON) 100 MG capsule TAKE 1 CAPSULE EVERY 6 HOURS AS NEEDED COUGH.  Needs office visit for refill.   Marland Kitchen dextromethorphan-guaiFENesin (MUCINEX DM) 30-600 MG per 12 hr tablet Take 1 tablet by mouth every 12 (twelve) hours.     . DULoxetine (CYMBALTA) 20 MG capsule TAKE ONE CAPSULE BY MOUTH EVERY DAY   . ipratropium (ATROVENT) 0.02 % nebulizer solution USE 1.25 ML (1/2 VIAL) EVERY 4 HOURS AS NEEDED FOR WHEEZING.Marland KitchenMarland KitchenDX 492.8   . omeprazole (PRILOSEC) 40 MG capsule TAKE 1 CAPSULE (40 MG TOTAL) BY MOUTH DAILY. 06/10/2016: Patient takes this twice daily  . arformoterol (BROVANA) 15 MCG/2ML NEBU Take 2 mLs (15 mcg total) by nebulization 2 (two) times daily. Dx 492.8 (Patient not taking: Reported on 06/10/2016) 06/10/2016: Has to be billed to Medicare Part  B  . budesonide (PULMICORT) 0.25 MG/2ML nebulizer solution USE 1 VIAL PER NEBULIZER TWICE A DAY (Patient not taking: Reported on 06/10/2016)   . meloxicam (MOBIC) 15 MG tablet Take 1 tablet (15 mg total) by mouth daily.   . ondansetron (ZOFRAN) 4 MG tablet Take 1 tablet (4 mg total) by mouth daily as needed for nausea or vomiting. (Patient not taking: Reported on 06/10/2016)   . zolpidem (AMBIEN) 5 MG tablet Take 5 mg by mouth at bedtime as needed for sleep.    . [DISCONTINUED] diclofenac (CATAFLAM) 50 MG tablet Take 1 tablet (50 mg total) by mouth 3 (three) times daily. One tablet TID with food prn pain. (Patient not taking: Reported on 06/10/2016)   . [DISCONTINUED] predniSONE (DELTASONE) 20 MG tablet Take 2 tablets (40 mg total) by mouth daily with breakfast. (Patient not taking: Reported on 06/10/2016)    No facility-administered encounter medications on file as of 06/10/2016.     Functional Status: In your present state of health, do you have any difficulty performing the following activities: 10/10/2015 09/22/2015  Hearing? N N  Vision? N N  Difficulty concentrating or making decisions? N N  Walking or climbing stairs? Y N  Dressing or bathing? N N  Doing errands, shopping? N N  Some recent data might be hidden    Fall/Depression Screening: Fall Risk  01/25/2016 10/10/2015  Falls in the past year? Yes Yes  Number falls in past yr: 2 or more 2 or more  Injury with Fall? No No  Risk Factor Category  High Fall Risk  High Fall Risk  Risk for fall due to : History of fall(s) History of fall(s)  Follow up Falls prevention discussed -   PHQ 2/9 Scores 01/25/2016 10/10/2015  PHQ - 2 Score 0 0    Assessment: Patient's medications were reviewed via telephone.   Drugs sorted by system:  Neurologic/Psychologic: Duloxetine Zolpidem (patient reported not taking)  Cardiovascular: Atorvastatin  Pulmonary/Allergy: ProAir HFA Albuterol nebulizer solution Brovana (never had filled due to  cost--has to be billed to Medicare Part B) Benzonatate Budesonide(patient said she is not using this)--nebulizer solution Mucinex DM (PRN only) Ipratropium nebulizer solution   Gastrointestinal: Omeprazole-takes twice daily instead of daily as prescribed Ondansetron (patient reported not taking)  Endocrine:  Renal:  Topical:  Pain: Meloxicam (patient did not recognize this medication--unsure if she is taking or not)    Medication Review Findings:  Adherence:  1.  Omeprazole-patient is taking twice daily vs daily due to symptoms  2.  COPD therapy:  Brovana and budesonide-- patient reported not taking. As such, she is not on any controller therapy.  3. Atorvastatin-[facility]  Cost of atorvastatin $1.25 for a 30 day supply.  Patient said she could go and pick it up today.  Pharmacist was asked to get it ready.  *Patient was encouraged to have her provider write her prescriptions for a 90 day supply because the copay would be the same.  Patient is dually eligible [facility])  Additional Therapy Needed:  COPD controller therapy.  If deemed therapeutically appropriate, patient could be started on a combination LABA/ICS inhaler (like Breo or Advair)  to replace Brovana and budesonide nebulizer solutions.   Patient said she does not have a current PCP.   Dr. Halford Kemp is her pulmonologist.  (Patient said she is on the waiting list for an appointment with Dr. Deedra Kemp is currently booked until July)  Plan:  1.  Route note to Dr. Halford Kemp  2.  Route note to [facility]  3.  Request sent to [place] to send list of providers to the patient.    4.  Close Pharmacy Episode

## 2016-06-13 ENCOUNTER — Telehealth: Payer: Self-pay | Admitting: Pulmonary Disease

## 2016-06-13 NOTE — Telephone Encounter (Signed)
Pharmacy called because pt went to pick up her Prilosec and she states she takes it twice a day. Pharmacist was asking for a new rx. When I called pt unidentified female stated she was asleep, asked them to have her call us. Our rx says once daily

## 2016-06-16 NOTE — Telephone Encounter (Signed)
lmtcb x2 for pt. 

## 2016-06-17 NOTE — Telephone Encounter (Signed)
Attempted to call the pt on the phone number listed in chart but no VM or answer.  Will try back later.

## 2016-06-18 NOTE — Telephone Encounter (Signed)
ATC, NA and no VM  

## 2016-06-20 DIAGNOSIS — E559 Vitamin D deficiency, unspecified: Secondary | ICD-10-CM | POA: Diagnosis not present

## 2016-06-20 DIAGNOSIS — R5383 Other fatigue: Secondary | ICD-10-CM | POA: Diagnosis not present

## 2016-06-20 DIAGNOSIS — M1990 Unspecified osteoarthritis, unspecified site: Secondary | ICD-10-CM | POA: Diagnosis not present

## 2016-06-20 DIAGNOSIS — R110 Nausea: Secondary | ICD-10-CM | POA: Diagnosis not present

## 2016-06-20 DIAGNOSIS — E7800 Pure hypercholesterolemia, unspecified: Secondary | ICD-10-CM | POA: Diagnosis not present

## 2016-06-20 DIAGNOSIS — J449 Chronic obstructive pulmonary disease, unspecified: Secondary | ICD-10-CM | POA: Diagnosis not present

## 2016-06-20 DIAGNOSIS — M545 Low back pain: Secondary | ICD-10-CM | POA: Diagnosis not present

## 2016-06-20 NOTE — Telephone Encounter (Signed)
Called and spoke with pts family member and requested that they advise the pt to call our office when she gets up.

## 2016-06-20 NOTE — Telephone Encounter (Signed)
Patient returned call, CB is [phone].

## 2016-06-20 NOTE — Telephone Encounter (Signed)
lmtcb for pt.  

## 2016-06-23 NOTE — Telephone Encounter (Signed)
I have not seen her since 2016, and last seen in office in August 2017.  She needs ROV first before refilling medications.

## 2016-06-23 NOTE — Telephone Encounter (Signed)
ATC x 1 line busy Please schedule appt with VS, okay to DB

## 2016-06-23 NOTE — Telephone Encounter (Signed)
VS pt stated that she has been taking her omeprazole 40 mg  BID.  She is out of this med and is needing a refill sent in.  Her med list states that she is only taking the omeprazole once daily.  VS please advise. thanks

## 2016-06-24 NOTE — Telephone Encounter (Signed)
protonix should be refilled by her PCP.

## 2016-06-24 NOTE — Telephone Encounter (Signed)
Called and spoke with pt and she stated that she is scheduled to VS 7/6.  Are you ok to give rx until her appt?  thanks

## 2016-06-24 NOTE — Telephone Encounter (Signed)
ATC pt, line continued to ring without VM. WCB.  

## 2016-06-25 NOTE — Telephone Encounter (Signed)
atc pt but the ling keeps ringing with no options of VM.  Will try back later.

## 2016-06-26 NOTE — Telephone Encounter (Signed)
Lm with family member to have pt call us back when she gets up this morning.

## 2016-06-27 NOTE — Telephone Encounter (Signed)
Have attempted to contact this pt several times and can never get her to the phone or she never calls back.  Will sign off at this time and when she calls back we can advise her of getting this med filled by her PCP.

## 2016-08-08 ENCOUNTER — Ambulatory Visit (INDEPENDENT_AMBULATORY_CARE_PROVIDER_SITE_OTHER)
Admission: RE | Admit: 2016-08-08 | Discharge: 2016-08-08 | Disposition: A | Discharge status: Home / Self Care | Payer: Medicare Other | Source: Ambulatory Visit | Attending: Pulmonary Disease | Admitting: Pulmonary Disease

## 2016-08-08 ENCOUNTER — Ambulatory Visit (INDEPENDENT_AMBULATORY_CARE_PROVIDER_SITE_OTHER): Payer: Medicare Other | Admitting: Pulmonary Disease

## 2016-08-08 ENCOUNTER — Encounter: Payer: Self-pay | Admitting: Pulmonary Disease

## 2016-08-08 ENCOUNTER — Other Ambulatory Visit (INDEPENDENT_AMBULATORY_CARE_PROVIDER_SITE_OTHER): Payer: Medicare Other

## 2016-08-08 VITALS — BP 152/80 | HR 93 | Ht 62.0 in | Wt 131.6 lb

## 2016-08-08 DIAGNOSIS — Z720 Tobacco use: Secondary | ICD-10-CM

## 2016-08-08 DIAGNOSIS — J441 Chronic obstructive pulmonary disease with (acute) exacerbation: Secondary | ICD-10-CM | POA: Diagnosis not present

## 2016-08-08 DIAGNOSIS — R05 Cough: Secondary | ICD-10-CM | POA: Diagnosis not present

## 2016-08-08 LAB — CBC WITH DIFFERENTIAL/PLATELET
BASOS ABS: 0.1 10*3/uL (ref 0.0–0.1)
Basophils Relative: 1.1 % (ref 0.0–3.0)
EOS ABS: 0.4 10*3/uL (ref 0.0–0.7)
Eosinophils Relative: 4.2 % (ref 0.0–5.0)
HEMATOCRIT: 40 % (ref 36.0–46.0)
HEMOGLOBIN: 13.4 g/dL (ref 12.0–15.0)
LYMPHS PCT: 29.1 % (ref 12.0–46.0)
Lymphs Abs: 2.9 10*3/uL (ref 0.7–4.0)
MCHC: 33.6 g/dL (ref 30.0–36.0)
MCV: 90.2 fl (ref 78.0–100.0)
MONO ABS: 0.9 10*3/uL (ref 0.1–1.0)
Monocytes Relative: 9.1 % (ref 3.0–12.0)
Neutro Abs: 5.6 10*3/uL (ref 1.4–7.7)
Neutrophils Relative %: 56.5 % (ref 43.0–77.0)
Platelets: 330 10*3/uL (ref 150.0–400.0)
RBC: 4.43 Mil/uL (ref 3.87–5.11)
RDW: 14.1 % (ref 11.5–15.5)
WBC: 9.8 10*3/uL (ref 4.0–10.5)

## 2016-08-08 LAB — COMPREHENSIVE METABOLIC PANEL
ALBUMIN: 4.3 g/dL (ref 3.5–5.2)
ALK PHOS: 92 U/L (ref 39–117)
ALT: 11 U/L (ref 0–35)
AST: 16 U/L (ref 0–37)
BILIRUBIN TOTAL: 0.2 mg/dL (ref 0.2–1.2)
BUN: 19 mg/dL (ref 6–23)
CALCIUM: 10.4 mg/dL (ref 8.4–10.5)
CHLORIDE: 106 meq/L (ref 96–112)
CO2: 26 mEq/L (ref 19–32)
CREATININE: 1.39 mg/dL — AB (ref 0.40–1.20)
GFR: 42.48 mL/min — ABNORMAL LOW (ref >60.00)
Glucose, Bld: 96 mg/dL (ref 70–99)
Potassium: 4.3 mEq/L (ref 3.5–5.1)
Sodium: 140 mEq/L (ref 135–145)
TOTAL PROTEIN: 7.8 g/dL (ref 6.0–8.3)

## 2016-08-08 MED ORDER — AZITHROMYCIN 250 MG PO TABS: ORAL_TABLET | ORAL | 0 refills | Status: DC

## 2016-08-08 MED ORDER — BUDESONIDE 0.25 MG/2ML IN SUSP
0.2500 mg | Freq: Two times a day (BID) | RESPIRATORY_TRACT | 12 refills | Status: DC
Start: 2016-08-08 — End: 2017-07-10

## 2016-08-08 MED ORDER — PREDNISONE 10 MG PO TABS: ORAL_TABLET | ORAL | 0 refills | Status: DC

## 2016-08-08 NOTE — Progress Notes (Signed)
Current Outpatient Prescriptions on File Prior to Visit  Medication Sig  . albuterol (PROAIR HFA) 108 (90 BASE) MCG/ACT inhaler INHALE 2 PUFFS INTO THE LUNGS EVERY FOUR HOURS AS NEEDED FOR WHEEZING  . albuterol (PROVENTIL) (2.5 MG/3ML) 0.083% nebulizer solution INHALE 1 VIAL EVERY 6 HOURS AS NEEDED  . arformoterol (BROVANA) 15 MCG/2ML NEBU Take 2 mLs (15 mcg total) by nebulization 2 (two) times daily. Dx 492.8  . atorvastatin (LIPITOR) 40 MG tablet Take 1 tablet (40 mg total) by mouth daily.  . benzonatate (TESSALON) 100 MG capsule TAKE 1 CAPSULE EVERY 6 HOURS AS NEEDED COUGH.  Needs office visit for refill.  Marland Kitchen dextromethorphan-guaiFENesin (MUCINEX DM) 30-600 MG per 12 hr tablet Take 1 tablet by mouth every 12 (twelve) hours.    . DULoxetine (CYMBALTA) 20 MG capsule TAKE ONE CAPSULE BY MOUTH EVERY DAY  . ipratropium (ATROVENT) 0.02 % nebulizer solution USE 1.25 ML (1/2 VIAL) EVERY 4 HOURS AS NEEDED FOR WHEEZING.Marland KitchenMarland KitchenDX 492.8  . meloxicam (MOBIC) 15 MG tablet Take 1 tablet (15 mg total) by mouth daily.  Marland Kitchen omeprazole (PRILOSEC) 40 MG capsule TAKE 1 CAPSULE (40 MG TOTAL) BY MOUTH DAILY.  Marland Kitchen ondansetron (ZOFRAN) 4 MG tablet Take 1 tablet (4 mg total) by mouth daily as needed for nausea or vomiting.  Marland Kitchen zolpidem (AMBIEN) 5 MG tablet Take 5 mg by mouth at bedtime as needed for sleep.    No current facility-administered medications on file prior to visit.     Chief Complaint  Patient presents with  . Follow-up    Pt c/o chest congestion and SOB x 2 weeks. Pt states that she can smell and taste infection but cannot get up any mucus. Pt states that she is having sleep disturbance d/t to the cough. Pt using all nebulizer meds as directed and taking Mucinex DM for cough/congestion.    Cardiac tests Echo 04/15/06>>EF 55 to 65%  Pulmonary tests A1AT 09/04/06 >> 236 Spirometry 10/26/09>>FEV1 2.59(88%), FEV1% 73 RAST 01/22/10 >> negative, IgE 11.9 Labs 01/22/10 >> ACE 46, RF < 20, ANA negative, ANCA  negative CT chest 06/26/10>>upper lobe emphysema CT chest 06/10/13 >> minimal centrilobular emphysema  Sleep tests Home sleep study 09/13/10>>AHI 1, SpO2 low 91%  Past medical history HTN, GERD, Anxiety, PAF, Hepatitis B, Cirrhosis, Depression, PE, CVA  Past surgical history, Family history, Social history, Allergies reviewed  Vital signs BP (!) 152/80 (BP Location: Left Arm, Cuff Size: Normal)   Pulse 93   Ht 5\' 2"  (1.575 m)   Wt 131 lb 9.6 oz (59.7 kg)   SpO2 97%   BMI 24.07 kg/m   History of Present Illness: Kimberly Kemp is a 51 y.o. female smoker with chronic cough, emphysema, rhinitis, and GERD.  She smokes about 1/4 to 1/2 ppd.  Her grandchildren have cold.  She caught this.  She has cough and feels congested in her chest.  She is having trouble bringing up sputum.  She has temp up to 100.7.  She is not having sinus congestion, sore throat or stomach pains.  She has tried cough medicine, but doesn't help. She has trouble affording pulmicort in current formulation, but said that she could get generic form that is cheaper.  She is using albuterol and ipratropium every four hours.   Physical Exam:  General - anxious Eyes - pupils reactive, wearing glasses ENT - no sinus tenderness, no oral exudate, no LAN Cardiac - regular, no murmur Chest - no wheeze, rales Abd - soft, non tender Ext -  no edema Skin - no rashes Neuro - normal strength Psych - normal mood    Assessment/Plan:  COPD exacerbation with hx of emphysema. - will give course of prednisone and Zpak - continue albuterol, and ipratropium - will send in script for generic budesonide >> hopefully she will be able afford this - CXR, labs today  Tobacco abuse. - discussed options to assist with smoking cessation.   Patient Instructions  Chest xray and lab tests today  Prednisone 10 mg pill >> 3 pills daily for 2 days, 2 pills daily for 2 days, 1 pill daily for 2 days  Zithromax >> 2 pills on day 1,  then 1 pill daily  Follow up in 4 weeks with Dr. Halford Chessman or Nurse Practitioner   Chesley Mires, MD [place] 08/08/2016, 12:45 PM Pager:  [phone]

## 2016-08-08 NOTE — Patient Instructions (Signed)
Chest xray and lab tests today  Prednisone 10 mg pill >> 3 pills daily for 2 days, 2 pills daily for 2 days, 1 pill daily for 2 days  Zithromax >> 2 pills on day 1, then 1 pill daily  Follow up in 4 weeks with Dr. Halford Chessman or Nurse Practitioner

## 2016-08-11 ENCOUNTER — Telehealth: Payer: Self-pay | Admitting: Pulmonary Disease

## 2016-08-11 DIAGNOSIS — N289 Disorder of kidney and ureter, unspecified: Secondary | ICD-10-CM

## 2016-08-11 NOTE — Telephone Encounter (Signed)
Patient is calling for results

## 2016-08-11 NOTE — Telephone Encounter (Signed)
Pt aware of results and voiced her understanding. Labs have been ordered. Nothing further needed.

## 2016-08-11 NOTE — Telephone Encounter (Signed)
Dg Chest 2 View  Result Date: 08/08/2016 CLINICAL DATA:  Cough, congestion, fever, headache x2 weeks EXAM: CHEST  2 VIEW COMPARISON:  09/22/2015 FINDINGS: Lungs are clear.  No pleural effusion or pneumothorax. The heart is normal in size. Visualized osseous structures are within normal limits. IMPRESSION: Normal chest radiographs. Electronically Signed   By: Julian Hy M.D.   On: 08/08/2016 13:01    CMP Latest Ref Rng & Units 08/08/2016 03/10/2016 09/23/2015  Glucose 70 - 99 mg/dL 96 98 106(H)  BUN 6 - 23 mg/dL 19 16 19   Creatinine 0.40 - 1.20 mg/dL 1.39(H) 0.88 0.91  Sodium 135 - 145 mEq/L 140 138 138  Potassium 3.5 - 5.1 mEq/L 4.3 3.8 3.7  Chloride 96 - 112 mEq/L 106 110 105  CO2 19 - 32 mEq/L 26 18(L) 23  Calcium 8.4 - 10.5 mg/dL 10.4 9.1 9.1  Total Protein 6.0 - 8.3 g/dL 7.8 7.9 -  Total Bilirubin 0.2 - 1.2 mg/dL 0.2 0.6 -  Alkaline Phos 39 - 117 U/L 92 59 -  AST 0 - 37 U/L 16 19 -  ALT 0 - 35 U/L 11 14 -    CBC Latest Ref Rng & Units 08/08/2016 03/10/2016 09/22/2015  WBC 4.0 - 10.5 K/uL 9.8 5.1 19.1(H)  Hemoglobin 12.0 - 15.0 g/dL 13.4 13.7 11.6(L)  Hematocrit 36.0 - 46.0 % 40.0 41.0 34.9(L)  Platelets 150.0 - 400.0 K/uL 330.0 249 308    Will have my nurse inform pt that CXR didn't show evidence for pneumonia.  Her labs showed decreased kidney function compared to test in February 2018.  She should make sure she is drinking plenty of water for now.  She needs to come in sometime this week for a repeat BMET and urinalysis, and will call her back after reviewing these to determine if anything further needs to be done.

## 2016-08-14 ENCOUNTER — Other Ambulatory Visit (INDEPENDENT_AMBULATORY_CARE_PROVIDER_SITE_OTHER): Payer: Medicare Other

## 2016-08-14 DIAGNOSIS — N289 Disorder of kidney and ureter, unspecified: Secondary | ICD-10-CM | POA: Diagnosis not present

## 2016-08-14 LAB — BASIC METABOLIC PANEL
BUN: 23 mg/dL (ref 6–23)
CALCIUM: 10.1 mg/dL (ref 8.4–10.5)
CO2: 25 meq/L (ref 19–32)
CREATININE: 1.13 mg/dL (ref 0.40–1.20)
Chloride: 106 mEq/L (ref 96–112)
GFR: 53.94 mL/min — ABNORMAL LOW (ref >60.00)
Glucose, Bld: 109 mg/dL — ABNORMAL HIGH (ref 70–99)
Potassium: 3.7 mEq/L (ref 3.5–5.1)
SODIUM: 138 meq/L (ref 135–145)

## 2016-08-14 LAB — URINALYSIS, ROUTINE W REFLEX MICROSCOPIC
Bilirubin Urine: NEGATIVE
KETONES UR: NEGATIVE
Leukocytes, UA: NEGATIVE
Nitrite: NEGATIVE
PH: 6 (ref 5.0–8.0)
SPECIFIC GRAVITY, URINE: 1.01 (ref 1.000–1.030)
TOTAL PROTEIN, URINE-UPE24: NEGATIVE
URINE GLUCOSE: NEGATIVE
UROBILINOGEN UA: 0.2 (ref 0.0–1.0)
WBC, UA: NONE SEEN (ref 0–?)

## 2016-08-26 ENCOUNTER — Telehealth: Payer: Self-pay | Admitting: Pulmonary Disease

## 2016-08-26 MED ORDER — TRAMADOL HCL 50 MG PO TABS: 50.0000 mg | ORAL_TABLET | Freq: Three times a day (TID) | ORAL | 1 refills | Status: DC | PRN

## 2016-08-26 NOTE — Telephone Encounter (Signed)
Spoke with pt. She is aware of VS response. Rx has been called in. Nothing further was needed.

## 2016-08-26 NOTE — Telephone Encounter (Signed)
Can send script for tramadol 50 mg tid prn, #30 with 1 refill   Please also let her know lab tests from 08/14/16 were okay.

## 2016-08-26 NOTE — Telephone Encounter (Signed)
Spoke with patient. She has been having a sharp, burning in both of her lungs. She stated this has been going on for the past 3 days. Denies taking any OTC meds for the pain. She also has had an increase in coughing.   [facility] [place]   VS, please advise. Thanks.

## 2016-09-07 ENCOUNTER — Telehealth: Payer: Self-pay | Admitting: Pulmonary Disease

## 2016-09-07 NOTE — Telephone Encounter (Signed)
Noted  

## 2016-09-07 NOTE — Telephone Encounter (Signed)
Called by Velva Harman who relates that she has been unable to keep food, medications or liquids down for the last week. She states that she feels very weak. She has had oral Thrush in the past. My concern is that she may again have oral/esophageal thrush. I am also concerned that she may be dehydrated. She also states the her "lungs are growling" on expiration. I am concerned that she may not be able to keep oral Prednisone and/or antibiotics down. In this case, outpatient oral therapy would not help her lungs and, in fact, may make her GI complaints worse. Recommended that she come the Emergency Department for further evaluation and possible admission. She agrees with this course of action and will have her husband take her to the Emergency Department when he gets home from work.

## 2016-09-08 ENCOUNTER — Ambulatory Visit: Payer: Medicare Other | Admitting: Acute Care

## 2016-12-05 ENCOUNTER — Other Ambulatory Visit: Payer: Self-pay | Admitting: Pulmonary Disease

## 2017-01-30 ENCOUNTER — Telehealth: Payer: Self-pay | Admitting: Pulmonary Disease

## 2017-01-30 MED ORDER — AZITHROMYCIN 250 MG PO TABS
ORAL_TABLET | ORAL | 0 refills | Status: DC
Start: 2017-01-30 — End: 2017-07-10

## 2017-01-30 NOTE — Telephone Encounter (Signed)
Pt c/o headaches, stuffy nose, sore throat, burning in esophagus, throat swelling, chest soreness, increased SOB, nonprod cough X2 months.  Denies fever, mucus production.   Pt denies taking anything to help with s/s.  Requesting additional recs.  [facility] [address]   VS please advise on recs.  Thanks!

## 2017-01-30 NOTE — Telephone Encounter (Signed)
Send script for Zpak please.

## 2017-01-30 NOTE — Telephone Encounter (Signed)
Called spoke with patient and advised of VS's recommendations Pt okay with the zpak Rx sent to verified pharmacy Pt asked for recommendations on OTC antiemetic medications >> recommended Nauzene or Dramamine-N  Nothing further needed; will sign off

## 2017-02-04 ENCOUNTER — Other Ambulatory Visit: Payer: Self-pay | Admitting: Pulmonary Disease

## 2017-04-04 ENCOUNTER — Other Ambulatory Visit: Payer: Self-pay | Admitting: Pulmonary Disease

## 2017-06-01 ENCOUNTER — Ambulatory Visit: Payer: Medicare Other | Admitting: Pulmonary Disease

## 2017-06-02 ENCOUNTER — Telehealth: Payer: Self-pay | Admitting: Pulmonary Disease

## 2017-06-02 MED ORDER — OMEPRAZOLE 40 MG PO CPDR: DELAYED_RELEASE_CAPSULE | ORAL | 0 refills | Status: DC

## 2017-06-02 MED ORDER — ALBUTEROL SULFATE HFA 108 (90 BASE) MCG/ACT IN AERS: INHALATION_SPRAY | RESPIRATORY_TRACT | 0 refills | Status: DC

## 2017-06-02 MED ORDER — ALBUTEROL SULFATE (2.5 MG/3ML) 0.083% IN NEBU: INHALATION_SOLUTION | RESPIRATORY_TRACT | 0 refills | Status: DC

## 2017-06-02 MED ORDER — IPRATROPIUM BROMIDE 0.02 % IN SOLN: RESPIRATORY_TRACT | 0 refills | Status: DC

## 2017-06-02 NOTE — Telephone Encounter (Signed)
Pt requesting refill on albuterol inhaler, albuterol neb, atrovent neb, and omeprazole.  These have been sent to preferred pharmacy.  Nothing further needed.

## 2017-06-30 ENCOUNTER — Ambulatory Visit: Payer: Medicare Other | Admitting: Pulmonary Disease

## 2017-07-06 ENCOUNTER — Other Ambulatory Visit: Payer: Self-pay | Admitting: Pulmonary Disease

## 2017-07-10 ENCOUNTER — Encounter: Payer: Self-pay | Admitting: Pulmonary Disease

## 2017-07-10 ENCOUNTER — Ambulatory Visit (INDEPENDENT_AMBULATORY_CARE_PROVIDER_SITE_OTHER)
Admission: RE | Admit: 2017-07-10 | Discharge: 2017-07-10 | Disposition: A | Discharge status: Home / Self Care | Payer: Medicare Other | Source: Ambulatory Visit | Attending: Pulmonary Disease | Admitting: Pulmonary Disease

## 2017-07-10 ENCOUNTER — Ambulatory Visit (INDEPENDENT_AMBULATORY_CARE_PROVIDER_SITE_OTHER): Payer: Medicare Other | Admitting: Pulmonary Disease

## 2017-07-10 VITALS — BP 122/82 | HR 100 | Ht 63.0 in | Wt 123.0 lb

## 2017-07-10 DIAGNOSIS — R05 Cough: Secondary | ICD-10-CM | POA: Diagnosis not present

## 2017-07-10 DIAGNOSIS — R079 Chest pain, unspecified: Secondary | ICD-10-CM | POA: Diagnosis not present

## 2017-07-10 DIAGNOSIS — J432 Centrilobular emphysema: Secondary | ICD-10-CM

## 2017-07-10 DIAGNOSIS — J441 Chronic obstructive pulmonary disease with (acute) exacerbation: Secondary | ICD-10-CM | POA: Diagnosis not present

## 2017-07-10 MED ORDER — BUDESONIDE 0.25 MG/2ML IN SUSP: 0.2500 mg | Freq: Two times a day (BID) | RESPIRATORY_TRACT | 12 refills | Status: DC

## 2017-07-10 MED ORDER — ALBUTEROL SULFATE (2.5 MG/3ML) 0.083% IN NEBU: INHALATION_SOLUTION | RESPIRATORY_TRACT | 0 refills | Status: DC

## 2017-07-10 MED ORDER — AZITHROMYCIN 250 MG PO TABS: ORAL_TABLET | ORAL | 0 refills | Status: DC

## 2017-07-10 MED ORDER — IPRATROPIUM BROMIDE 0.02 % IN SOLN: RESPIRATORY_TRACT | 0 refills | Status: DC

## 2017-07-10 MED ORDER — PREDNISONE 10 MG PO TABS: ORAL_TABLET | ORAL | 0 refills | Status: DC

## 2017-07-10 MED ORDER — ALBUTEROL SULFATE HFA 108 (90 BASE) MCG/ACT IN AERS: INHALATION_SPRAY | RESPIRATORY_TRACT | 0 refills | Status: DC

## 2017-07-10 MED ORDER — BENZONATATE 100 MG PO CAPS: ORAL_CAPSULE | ORAL | 0 refills | Status: DC

## 2017-07-10 NOTE — Patient Instructions (Signed)
Prednisone 10 mg pill >> 3 pills daily for 2 days, 2 pill daily for 2 days, 1 pill daily for 2 days Zithromax 250 mg pill >> 2 pills on day 1, then 1 pill daily for next 4 days Chest xray today Will schedule pulmonary function test Tessalon 1 pill every 8 hours as needed for cough  Follow up in 2 months

## 2017-07-10 NOTE — Progress Notes (Signed)
[facility]  Chief Complaint  Patient presents with  . Follow-up    Pt is having increase SOB rest and exertion, some wheezing. Pt states in last 3 weeks increase of chest pain, chest tightness, blurred vision, left arm pain, and not feeling well.     Vital signs: BP 122/82 (BP Location: Left Arm, Cuff Size: Normal)   Pulse 100   Ht 5\' 3"  (1.6 m)   Wt 123 lb (55.8 kg)   SpO2 98%   BMI 21.79 kg/m   History of Present Illness: Kimberly Kemp is a 52 y.o. female smoker with cough, emphysema, rhinitis, and GERD.  She continues to smoke 4 to 5 cigarettes per day.  Slowly cutting down.  Has more cough over past several weeks.  Getting wheeze.  Has occasional clear sputum and sometimes has some blood streaks.  Gets swelling in lower legs in afternoon.  Having more trouble with breathing while asleep.  Denies fever, sinus congestion, skin rash, gland swelling, abdominal pain.  Physical Exam:  General - pleasant Eyes - pupils reactive ENT - no sinus tenderness, no oral exudate, no LAN Cardiac - regular, no murmur Chest - coarse rhonchi b/l Abd - soft, non tender Ext - no edema Skin - no rashes Neuro - normal strength Psych - normal mood   Assessment/Plan:  COPD exacerbation. - CXR today - prednisone, zpak - prn tessalon  COPD with emphysema. - continue pulmicort, atrovent, albuterol nebulizers - repeat PFT  Tobacco abuse. - she will continue to try quitting on her own   Patient Instructions  Prednisone 10 mg pill >> 3 pills daily for 2 days, 2 pill daily for 2 days, 1 pill daily for 2 days Zithromax 250 mg pill >> 2 pills on day 1, then 1 pill daily for next 4 days Chest xray today Will schedule pulmonary function test Tessalon 1 pill every 8 hours as needed for cough  Follow up in 2 months    Chesley Mires, MD [place] 07/10/2017, 11:21 AM  Flow Sheet  Cardiac tests: Echo 04/15/06>>EF 55 to  65%  Pulmonary tests A1AT 09/04/06 >> 236 Spirometry 10/26/09>>FEV1 2.59(88%), FEV1% 73 RAST 01/22/10 >> negative, IgE 11.9 Labs 01/22/10 >> ACE 46, RF < 20, ANA negative, ANCA negative CT chest 06/26/10>>upper lobe emphysema CT chest 06/10/13 >> minimal centrilobular emphysema  Sleep tests Home sleep study 09/13/10>>AHI 1, SpO2 low 91%  Past Medical History: She  has a past medical history of Anxiety, Asthma, Chronic pain, Cirrhosis ([place]), COPD (chronic obstructive pulmonary disease) ([place]), Depression, Emphysema, GERD (gastroesophageal reflux disease), Hepatitis B, Hypertension, Paroxysmal atrial fibrillation ([place]), Pulmonary embolism ([place]), Stroke ([place]), and Tobacco abuse.  Past Surgical History: She  has a past surgical history that includes Bronchoscopy (March 2008, Feb 2012); Cesarean section; and Appendectomy.  Family History: Her family history includes Diabetes in her mother; Emphysema in her father and maternal grandmother; Heart disease in her mother; Liver disease in her father; Prostate cancer in her father.  Social History: She  reports that she has been smoking cigarettes.  She has a 10.75 pack-year smoking history. She has never used smokeless tobacco. She reports that she does not drink alcohol or use drugs.  Medications: Allergies as of 07/10/2017      Reactions   Penicillins Hives   Has patient had a PCN reaction causing immediate rash, facial/tongue/throat swelling, SOB or lightheadedness with hypotension: Yes Has patient had a PCN reaction causing severe rash involving  mucus membranes or skin necrosis: No Has patient had a PCN reaction that required hospitalization No Has patient had a PCN reaction occurring within the last 10 years: No If all of the above answers are "NO", then may proceed with Cephalosporin use.      Medication List        Accurate as of 07/10/17 11:21 AM. Always use your most recent med list.          albuterol (2.5 MG/3ML) 0.083%  nebulizer solution Commonly known as:  PROVENTIL INHALE 1 VIAL INTO THE LUNGS FOUR TIMES A DAY AS NEEDED   albuterol 108 (90 Base) MCG/ACT inhaler Commonly known as:  PROAIR HFA INHALE 2 PUFFS INTO THE LUNGS EVERY FOUR HOURS AS NEEDED FOR WHEEZING   azithromycin 250 MG tablet Commonly known as:  ZITHROMAX 2 pills on day 1, 1 pill daily for next 4 days   benzonatate 100 MG capsule Commonly known as:  TESSALON TAKE 1 CAPSULE EVERY 6 HOURS AS NEEDED COUGH.  Needs office visit for refill.   budesonide 0.25 MG/2ML nebulizer solution Commonly known as:  PULMICORT Take 2 mLs (0.25 mg total) by nebulization 2 (two) times daily.   dextromethorphan-guaiFENesin 30-600 MG 12hr tablet Commonly known as:  MUCINEX DM Take 1 tablet by mouth every 12 (twelve) hours.   DULoxetine 20 MG capsule Commonly known as:  CYMBALTA TAKE ONE CAPSULE BY MOUTH EVERY DAY   ipratropium 0.02 % nebulizer solution Commonly known as:  ATROVENT TAKE 1 VIAL BY NEBULIZER EVERY 4 HOURS AS NEEDED   meloxicam 15 MG tablet Commonly known as:  MOBIC Take 1 tablet (15 mg total) by mouth daily.   omeprazole 40 MG capsule Commonly known as:  PRILOSEC TAKE 1 CAPSULE BY MOUTH EVERY DAY   predniSONE 10 MG tablet Commonly known as:  DELTASONE 3 pills daily for 2 days, 2 pills daily for 2 days, 1 pill daily for 2 days   zolpidem 5 MG tablet Commonly known as:  AMBIEN Take 5 mg by mouth at bedtime as needed for sleep.

## 2017-07-13 ENCOUNTER — Telehealth: Payer: Self-pay | Admitting: Pulmonary Disease

## 2017-07-13 NOTE — Telephone Encounter (Signed)
Spoke with patient. She is aware of results.   Nothing else needed at time of call.  

## 2017-07-13 NOTE — Telephone Encounter (Signed)
Called pt to advise her the x-ray is back and that I would send it to be reviewed by Dr. Halford Chessman. She was not available so I left a detailed message advising her that we would call her back as soon as its reviewed.   VS please advise on x-ray results.

## 2017-07-13 NOTE — Telephone Encounter (Signed)
Dg Chest 2 View  Result Date: 07/10/2017 CLINICAL DATA:  Chronic cough. Chest pain. COPD with acute exacerbation. Centrilobular emphysema. EXAM: CHEST - 2 VIEW COMPARISON:  08/08/2016. FINDINGS: The heart size and mediastinal contours are within normal limits. Both lungs are free of consolidation or edema. Mildly increased chronic interstitial markings consistent with the stated diagnosis of COPD. No effusion or pneumothorax. The visualized skeletal structures are unremarkable. IMPRESSION: COPD.  No active disease. Electronically Signed   By: Staci Righter M.D.   On: 07/10/2017 16:37    Please let her know her CXR shows expected changes from COPD.  No signs of pneumonia.

## 2017-07-13 NOTE — Telephone Encounter (Signed)
Pt is calling back [phone]

## 2017-07-13 NOTE — Telephone Encounter (Signed)
ATC pt, she was not available. Left message with female for pt to call back.

## 2017-07-22 DIAGNOSIS — E559 Vitamin D deficiency, unspecified: Secondary | ICD-10-CM | POA: Diagnosis not present

## 2017-07-22 DIAGNOSIS — K219 Gastro-esophageal reflux disease without esophagitis: Secondary | ICD-10-CM | POA: Diagnosis not present

## 2017-07-22 DIAGNOSIS — J019 Acute sinusitis, unspecified: Secondary | ICD-10-CM | POA: Diagnosis not present

## 2017-07-22 DIAGNOSIS — E782 Mixed hyperlipidemia: Secondary | ICD-10-CM | POA: Diagnosis not present

## 2017-07-22 DIAGNOSIS — Z1389 Encounter for screening for other disorder: Secondary | ICD-10-CM | POA: Diagnosis not present

## 2017-07-22 DIAGNOSIS — J449 Chronic obstructive pulmonary disease, unspecified: Secondary | ICD-10-CM | POA: Diagnosis not present

## 2017-07-22 DIAGNOSIS — J029 Acute pharyngitis, unspecified: Secondary | ICD-10-CM | POA: Diagnosis not present

## 2017-07-29 DIAGNOSIS — J449 Chronic obstructive pulmonary disease, unspecified: Secondary | ICD-10-CM | POA: Diagnosis not present

## 2017-07-29 DIAGNOSIS — J209 Acute bronchitis, unspecified: Secondary | ICD-10-CM | POA: Diagnosis not present

## 2017-07-29 DIAGNOSIS — J439 Emphysema, unspecified: Secondary | ICD-10-CM | POA: Diagnosis not present

## 2017-08-05 ENCOUNTER — Other Ambulatory Visit: Payer: Self-pay | Admitting: Pulmonary Disease

## 2017-08-05 NOTE — Telephone Encounter (Signed)
Received refill request for benzonatate 100mg  caps.  Med was last refilled 07/10/17, #30 with 0 RF.  Dr. Halford Chessman, please advise if it is okay to refill this med for pt.

## 2017-08-06 ENCOUNTER — Other Ambulatory Visit: Payer: Self-pay | Admitting: Pulmonary Disease

## 2017-08-12 DIAGNOSIS — B370 Candidal stomatitis: Secondary | ICD-10-CM | POA: Diagnosis not present

## 2017-08-12 DIAGNOSIS — J449 Chronic obstructive pulmonary disease, unspecified: Secondary | ICD-10-CM | POA: Diagnosis not present

## 2017-08-12 DIAGNOSIS — K029 Dental caries, unspecified: Secondary | ICD-10-CM | POA: Diagnosis not present

## 2017-08-12 DIAGNOSIS — K219 Gastro-esophageal reflux disease without esophagitis: Secondary | ICD-10-CM | POA: Diagnosis not present

## 2017-08-19 DIAGNOSIS — K219 Gastro-esophageal reflux disease without esophagitis: Secondary | ICD-10-CM | POA: Diagnosis not present

## 2017-08-19 DIAGNOSIS — E7800 Pure hypercholesterolemia, unspecified: Secondary | ICD-10-CM | POA: Diagnosis not present

## 2017-08-19 DIAGNOSIS — J449 Chronic obstructive pulmonary disease, unspecified: Secondary | ICD-10-CM | POA: Diagnosis not present

## 2017-09-03 ENCOUNTER — Encounter: Payer: Self-pay | Admitting: Gastroenterology

## 2017-09-04 ENCOUNTER — Other Ambulatory Visit: Payer: Self-pay | Admitting: Pulmonary Disease

## 2017-09-14 ENCOUNTER — Ambulatory Visit (INDEPENDENT_AMBULATORY_CARE_PROVIDER_SITE_OTHER): Payer: Medicare Other | Admitting: Pulmonary Disease

## 2017-09-14 ENCOUNTER — Encounter: Payer: Self-pay | Admitting: Pulmonary Disease

## 2017-09-14 VITALS — BP 122/68 | HR 112 | Ht 63.0 in | Wt 126.8 lb

## 2017-09-14 DIAGNOSIS — J432 Centrilobular emphysema: Secondary | ICD-10-CM

## 2017-09-14 DIAGNOSIS — Z720 Tobacco use: Secondary | ICD-10-CM

## 2017-09-14 DIAGNOSIS — J441 Chronic obstructive pulmonary disease with (acute) exacerbation: Secondary | ICD-10-CM | POA: Diagnosis not present

## 2017-09-14 MED ORDER — PREDNISONE 5 MG PO TABS: 5.0000 mg | ORAL_TABLET | Freq: Every day | ORAL | 2 refills | Status: DC

## 2017-09-14 MED ORDER — ALBUTEROL SULFATE (2.5 MG/3ML) 0.083% IN NEBU
2.5000 mg | INHALATION_SOLUTION | Freq: Once | RESPIRATORY_TRACT | Status: AC
  Administered 2017-09-14: 2.5 mg via RESPIRATORY_TRACT

## 2017-09-14 NOTE — Patient Instructions (Signed)
Prednisone 5 mg pill >> 3 pills daily for 2 days, 2 pills daily for 2 days, then 1 pill daily until follow up   Follow up in 1 month with Dr. Halford Chessman or Nurse Practitioner

## 2017-09-14 NOTE — Progress Notes (Signed)
[facility]  Chief Complaint  Patient presents with  . Follow-up    follow up for COPD    Constitutional: BP 122/68 (BP Location: Left Arm, Cuff Size: Normal)   Pulse (!) 112   Ht 5\' 3"  (1.6 m)   Wt 126 lb 12.8 oz (57.5 kg)   LMP 07/18/2016   SpO2 98%   BMI 22.46 kg/m   History of Present Illness: Kimberly Kemp is a 52 y.o. female smoker with cough, emphysema, rhinitis, and GERD.  She was to have PFT today.  She didn't use her nebulizer.  Her breathing is much worse today.  Has cough, wheeze, and chest congestion with clear sputum.  Down to 1/4 pack per day.  Not having sinus congestion, fever, hemoptysis, skin rash, gland swelling, abdominal pain, or leg swelling.  Comprehensive Respiratory Exam:  Appearance - well kempt  ENMT - nasal mucosa moist, turbinates clear, midline nasal septum, no dental lesions, no gingival bleeding, no oral exudates, no tonsillar hypertrophy Neck - no masses, trachea midline, no thyromegaly, no elevation in JVP Respiratory - b/l expiratory wheeze with crackles >> improved after nebulizer tx.  No dullness.  Normal respiratory expansion. CV - s1s2 regular rate and rhythm, no murmurs, no peripheral edema, radial pulses symmetric GI - soft, non tender, no masses Lymph - no adenopathy noted in neck and axillary areas MSK - normal muscle strength and tone, normal gait Ext - no cyanosis, clubbing, or joint inflammation noted Skin - no rashes, lesions, or ulcers Neuro - oriented to person, place, and time Psych - anxious  Assessment/Plan:  COPD exacerbation. - recurrent exacerbation - will give course of prednisone - don't think she needs ABx at this time  COPD with emphysema. - continue pulmicort, atrovent, albuterol by nebulizer - will have her remain on 5 mg prednisone daily until next visit - will need to postpone PFT for now  Tobacco abuse. - encouraged her to keep up with progress of trying to quit  smoking and review options to assist with this   Patient Instructions  Prednisone 5 mg pill >> 3 pills daily for 2 days, 2 pills daily for 2 days, then 1 pill daily until follow up   Follow up in 1 month with Dr. Halford Chessman or Nurse Practitioner    Chesley Mires, MD [place] 09/14/2017, 11:51 AM  Flow Sheet  Cardiac tests: Echo 04/15/06>>EF 55 to 65%  Pulmonary tests A1AT 09/04/06 >> 236 Spirometry 10/26/09>>FEV1 2.59(88%), FEV1% 73 RAST 01/22/10 >> negative, IgE 11.9 Labs 01/22/10 >> ACE 46, RF < 20, ANA negative, ANCA negative CT chest 06/26/10 >> upper lobe emphysema CT chest 06/10/13 >> minimal centrilobular emphysema  Sleep tests Home sleep study 09/13/10>>AHI 1, SpO2 low 91%  Past Medical History: She  has a past medical history of Anxiety, Asthma, Chronic pain, Cirrhosis ([place]), COPD (chronic obstructive pulmonary disease) ([place]), Depression, Emphysema, GERD (gastroesophageal reflux disease), Hepatitis B, Hypertension, Paroxysmal atrial fibrillation ([place]), Pulmonary embolism ([place]), Stroke ([place]), and Tobacco abuse.  Past Surgical History: She  has a past surgical history that includes Bronchoscopy (March 2008, Feb 2012); Cesarean section; and Appendectomy.  Family History: Her family history includes Diabetes in her mother; Emphysema in her father and maternal grandmother; Heart disease in her mother; Liver disease in her father; Prostate cancer in her father.  Social History: She  reports that she has been smoking cigarettes. She has a 10.75 pack-year smoking history. She has never used smokeless tobacco.  She reports that she does not drink alcohol or use drugs.  Medications: Allergies as of 09/14/2017      Reactions   Penicillins Hives   Has patient had a PCN reaction causing immediate rash, facial/tongue/throat swelling, SOB or lightheadedness with hypotension: Yes Has patient had a PCN reaction causing severe rash involving mucus membranes or skin  necrosis: No Has patient had a PCN reaction that required hospitalization No Has patient had a PCN reaction occurring within the last 10 years: No If all of the above answers are "NO", then may proceed with Cephalosporin use.      Medication List        Accurate as of 09/14/17 11:51 AM. Always use your most recent med list.          albuterol 108 (90 Base) MCG/ACT inhaler Commonly known as:  PROVENTIL HFA;VENTOLIN HFA INHALE 2 PUFFS INTO THE LUNGS EVERY FOUR HOURS AS NEEDED FOR WHEEZING   albuterol (2.5 MG/3ML) 0.083% nebulizer solution Commonly known as:  PROVENTIL INHALE 1 VIAL INTO THE LUNGS FOUR TIMES A DAY AS NEEDED   benzonatate 100 MG capsule Commonly known as:  TESSALON TAKE 1 CAPSULE EVERY 6 HOURS AS NEEDED COUGH. NEEDS OFFICE VISIT FOR REFILL.   budesonide 0.25 MG/2ML nebulizer solution Commonly known as:  PULMICORT Take 2 mLs (0.25 mg total) by nebulization 2 (two) times daily.   dextromethorphan-guaiFENesin 30-600 MG 12hr tablet Commonly known as:  MUCINEX DM Take 1 tablet by mouth every 12 (twelve) hours.   DULoxetine 20 MG capsule Commonly known as:  CYMBALTA TAKE ONE CAPSULE BY MOUTH EVERY DAY   ipratropium 0.02 % nebulizer solution Commonly known as:  ATROVENT TAKE 1 VIAL BY NEBULIZER EVERY 4 HOURS AS NEEDED   meloxicam 15 MG tablet Commonly known as:  MOBIC Take 1 tablet (15 mg total) by mouth daily.   omeprazole 40 MG capsule Commonly known as:  PRILOSEC TAKE 1 CAPSULE BY MOUTH EVERY DAY   predniSONE 5 MG tablet Commonly known as:  DELTASONE Take 1 tablet (5 mg total) by mouth daily with breakfast.   zolpidem 5 MG tablet Commonly known as:  AMBIEN Take 5 mg by mouth at bedtime as needed for sleep.

## 2017-10-06 ENCOUNTER — Other Ambulatory Visit: Payer: Self-pay | Admitting: Pulmonary Disease

## 2017-10-06 ENCOUNTER — Telehealth: Payer: Self-pay | Admitting: Pulmonary Disease

## 2017-10-06 NOTE — Telephone Encounter (Signed)
For the past two day she has had heaviness in her chest and and has been coughing up a little blood during coughing spells she states it is not a lot of blood. She is also having SOB. I have schedule her appointment for tomorrow at 3:15pm with Derl Barrow this was the earliest she could come in. She would like to know if there is something she can take for this in the mean time or if her tessalon can be refilled.   Dr. Halford Chessman please advise thank you.

## 2017-10-06 NOTE — Telephone Encounter (Signed)
Call from [name]  through [facility] - has copd, says feeling really bad, lot of cough and overnight hemoptysis  Advised ER - she said ok and will go to closest one

## 2017-10-07 ENCOUNTER — Ambulatory Visit: Payer: Medicare Other | Admitting: Primary Care

## 2017-10-08 ENCOUNTER — Other Ambulatory Visit: Payer: Self-pay | Admitting: Pulmonary Disease

## 2017-10-14 DIAGNOSIS — E559 Vitamin D deficiency, unspecified: Secondary | ICD-10-CM | POA: Diagnosis not present

## 2017-10-14 DIAGNOSIS — R110 Nausea: Secondary | ICD-10-CM | POA: Diagnosis not present

## 2017-10-14 DIAGNOSIS — J449 Chronic obstructive pulmonary disease, unspecified: Secondary | ICD-10-CM | POA: Diagnosis not present

## 2017-10-14 DIAGNOSIS — G43009 Migraine without aura, not intractable, without status migrainosus: Secondary | ICD-10-CM | POA: Diagnosis not present

## 2017-10-19 ENCOUNTER — Ambulatory Visit: Payer: Medicare Other | Admitting: Pulmonary Disease

## 2017-10-22 DIAGNOSIS — E559 Vitamin D deficiency, unspecified: Secondary | ICD-10-CM | POA: Diagnosis not present

## 2017-10-22 DIAGNOSIS — I4891 Unspecified atrial fibrillation: Secondary | ICD-10-CM | POA: Diagnosis not present

## 2017-10-22 DIAGNOSIS — E7800 Pure hypercholesterolemia, unspecified: Secondary | ICD-10-CM | POA: Diagnosis not present

## 2017-10-22 DIAGNOSIS — J449 Chronic obstructive pulmonary disease, unspecified: Secondary | ICD-10-CM | POA: Diagnosis not present

## 2017-10-23 ENCOUNTER — Ambulatory Visit: Payer: Medicare Other | Admitting: Pulmonary Disease

## 2017-11-02 ENCOUNTER — Other Ambulatory Visit: Payer: Self-pay | Admitting: Pulmonary Disease

## 2017-11-18 DIAGNOSIS — G43909 Migraine, unspecified, not intractable, without status migrainosus: Secondary | ICD-10-CM | POA: Diagnosis not present

## 2017-11-18 DIAGNOSIS — J449 Chronic obstructive pulmonary disease, unspecified: Secondary | ICD-10-CM | POA: Diagnosis not present

## 2017-11-18 DIAGNOSIS — I4891 Unspecified atrial fibrillation: Secondary | ICD-10-CM | POA: Diagnosis not present

## 2017-11-18 DIAGNOSIS — M1990 Unspecified osteoarthritis, unspecified site: Secondary | ICD-10-CM | POA: Diagnosis not present

## 2017-12-09 ENCOUNTER — Emergency Department (HOSPITAL_COMMUNITY): Payer: Medicare Other

## 2017-12-09 ENCOUNTER — Emergency Department (HOSPITAL_COMMUNITY)
Admission: EM | Admit: 2017-12-09 | Discharge: 2017-12-09 | Disposition: A | Discharge status: Home / Self Care | Payer: Medicare Other | Attending: Emergency Medicine | Admitting: Emergency Medicine

## 2017-12-09 ENCOUNTER — Other Ambulatory Visit: Payer: Self-pay

## 2017-12-09 ENCOUNTER — Encounter (HOSPITAL_COMMUNITY): Payer: Self-pay

## 2017-12-09 DIAGNOSIS — Z79899 Other long term (current) drug therapy: Secondary | ICD-10-CM | POA: Diagnosis not present

## 2017-12-09 DIAGNOSIS — R0602 Shortness of breath: Secondary | ICD-10-CM | POA: Diagnosis not present

## 2017-12-09 DIAGNOSIS — J45909 Unspecified asthma, uncomplicated: Secondary | ICD-10-CM | POA: Diagnosis not present

## 2017-12-09 DIAGNOSIS — R05 Cough: Secondary | ICD-10-CM | POA: Diagnosis not present

## 2017-12-09 DIAGNOSIS — R079 Chest pain, unspecified: Secondary | ICD-10-CM | POA: Diagnosis not present

## 2017-12-09 DIAGNOSIS — Z86711 Personal history of pulmonary embolism: Secondary | ICD-10-CM | POA: Diagnosis not present

## 2017-12-09 DIAGNOSIS — K209 Esophagitis, unspecified without bleeding: Secondary | ICD-10-CM

## 2017-12-09 DIAGNOSIS — I10 Essential (primary) hypertension: Secondary | ICD-10-CM | POA: Insufficient documentation

## 2017-12-09 DIAGNOSIS — F17210 Nicotine dependence, cigarettes, uncomplicated: Secondary | ICD-10-CM | POA: Diagnosis not present

## 2017-12-09 LAB — CBC
HCT: 44.5 % (ref 36.0–46.0)
Hemoglobin: 14.6 g/dL (ref 12.0–15.0)
MCH: 30 pg (ref 26.0–34.0)
MCHC: 32.8 g/dL (ref 30.0–36.0)
MCV: 91.6 fL (ref 80.0–100.0)
Platelets: 251 10*3/uL (ref 150–400)
RBC: 4.86 MIL/uL (ref 3.87–5.11)
RDW: 13.6 % (ref 11.5–15.5)
WBC: 8.6 10*3/uL (ref 4.0–10.5)
nRBC: 0 % (ref 0.0–0.2)

## 2017-12-09 LAB — BASIC METABOLIC PANEL
Anion gap: 8 (ref 5–15)
BUN: 13 mg/dL (ref 6–20)
CO2: 24 mmol/L (ref 22–32)
Calcium: 9.7 mg/dL (ref 8.9–10.3)
Chloride: 105 mmol/L (ref 98–111)
Creatinine, Ser: 1.01 mg/dL — ABNORMAL HIGH (ref 0.44–1.00)
GFR calc Af Amer: >60 mL/min (ref >60)
GFR calc non Af Amer: >60 mL/min (ref >60)
Glucose, Bld: 118 mg/dL — ABNORMAL HIGH (ref 70–99)
Potassium: 4.2 mmol/L (ref 3.5–5.1)
Sodium: 137 mmol/L (ref 135–145)

## 2017-12-09 LAB — I-STAT BETA HCG BLOOD, ED (MC, WL, AP ONLY): I-stat hCG, quantitative: <5 m[IU]/mL (ref <5)

## 2017-12-09 LAB — D-DIMER, QUANTITATIVE: D-Dimer, Quant: 0.38 ug/mL-FEU (ref 0.00–0.50)

## 2017-12-09 MED ORDER — IPRATROPIUM-ALBUTEROL 0.5-2.5 (3) MG/3ML IN SOLN
3.0000 mL | Freq: Once | RESPIRATORY_TRACT | Status: AC
  Administered 2017-12-09: 3 mL via RESPIRATORY_TRACT
  Filled 2017-12-09: qty 3

## 2017-12-09 MED ORDER — LIDOCAINE VISCOUS HCL 2 % MT SOLN
15.0000 mL | Freq: Once | OROMUCOSAL | Status: AC
  Administered 2017-12-09: 15 mL via ORAL
  Filled 2017-12-09: qty 15

## 2017-12-09 MED ORDER — NYSTATIN 100000 UNIT/ML MT SUSP: 500000.0000 [IU] | Freq: Four times a day (QID) | OROMUCOSAL | 0 refills | Status: DC

## 2017-12-09 MED ORDER — DEXAMETHASONE 4 MG PO TABS
8.0000 mg | ORAL_TABLET | Freq: Once | ORAL | Status: AC
  Administered 2017-12-09: 8 mg via ORAL
  Filled 2017-12-09: qty 2

## 2017-12-09 MED ORDER — ALUM & MAG HYDROXIDE-SIMETH 200-200-20 MG/5ML PO SUSP
30.0000 mL | Freq: Once | ORAL | Status: AC
  Administered 2017-12-09: 30 mL via ORAL
  Filled 2017-12-09: qty 30

## 2017-12-09 MED ORDER — ALBUTEROL SULFATE (2.5 MG/3ML) 0.083% IN NEBU
5.0000 mg | INHALATION_SOLUTION | Freq: Once | RESPIRATORY_TRACT | Status: AC
  Administered 2017-12-09: 5 mg via RESPIRATORY_TRACT
  Filled 2017-12-09: qty 6

## 2017-12-09 NOTE — ED Notes (Signed)
Pt ready to go home. Called lab to check on d dimer results.  In process at this time

## 2017-12-09 NOTE — ED Triage Notes (Signed)
Pt BIB POV for eval of worsening SOB. Pt reports she has had a chest cold for 2-3 weeks, gradually worsening w/ SOB. Pt reports it got bad enough today to prompt her to present here. Pt has hx of COPD, endorses continues to smoke 0.5 PPD.

## 2017-12-09 NOTE — ED Notes (Signed)
Patient transported to X-ray 

## 2017-12-09 NOTE — ED Provider Notes (Signed)
[facility] EMERGENCY DEPARTMENT Provider Note   CSN: [id number] Arrival date & time: 12/09/17  1539     History   Chief Complaint Chief Complaint  Patient presents with  . Shortness of Breath    HPI Kimberly Kemp is a 52 y.o. female.  HPI   52 year old female with cough and chest pain.  She reports symptoms began about 4 weeks ago.  Constant.  She is reporting pain in her throat and "my esophagus."  Pain is worse when she coughs and sometimes when she takes deep breaths.  She does not feel short of breath though.  No fevers or chills.  No unusual swelling.  Past history of COPD and continues to smoke.  She is not on home oxygen.  She has not tried taking anything for symptoms.  Past Medical History:  Diagnosis Date  . Anxiety   . Asthma   . Chronic pain   . Cirrhosis ([place])   . COPD (chronic obstructive pulmonary disease) ([place])   . Depression   . Emphysema   . GERD (gastroesophageal reflux disease)   . Hepatitis B   . Hypertension   . Paroxysmal atrial fibrillation ([facility])   . Pulmonary embolism ([place])   . Stroke ([place])   . Tobacco abuse     Patient Active Problem List   Diagnosis Date Noted  . Generalized anxiety disorder 10/10/2015  . COPD exacerbation ([place]) 09/22/2015  . Essential hypertension 09/22/2015  . History of stroke 09/22/2015  . Leukocytosis 09/22/2015  . Acute respiratory failure ([place])   . Mixed hyperlipidemia   . Acute exacerbation of chronic obstructive pulmonary disease (COPD) ([place]) 09/04/2015  . Acute bronchitis 04/04/2015  . Hemoptysis 06/30/2014  . Thrush 07/22/2013  . Chest pain 04/27/2013  . GERD (gastroesophageal reflux disease) 06/18/2012  . Chronic cough 06/18/2012  . EMPHYSEMA 05/27/2007  . Tobacco abuse 12/11/2006    Past Surgical History:  Procedure Laterality Date  . APPENDECTOMY     microscopic   . BRONCHOSCOPY  March 2008, Feb 2012  . CESAREAN SECTION       OB History   None      Home Medications      Prior to Admission medications   Medication Sig Start Date End Date Taking? Authorizing Provider  albuterol (PROAIR HFA) 108 (90 Base) MCG/ACT inhaler INHALE 2 PUFFS INTO THE LUNGS EVERY FOUR HOURS AS NEEDED FOR WHEEZING 08/07/17   Chesley Mires, MD  albuterol (PROVENTIL) (2.5 MG/3ML) 0.083% nebulizer solution INHALE 1 VIAL INTO THE LUNGS FOUR TIMES A DAY AS NEEDED 10/06/17   Chesley Mires, MD  benzonatate (TESSALON) 100 MG capsule TAKE 1 CAPSULE EVERY 6 HOURS AS NEEDED COUGH. NEEDS OFFICE VISIT FOR REFILL. 08/05/17   Chesley Mires, MD  budesonide (PULMICORT) 0.25 MG/2ML nebulizer solution Take 2 mLs (0.25 mg total) by nebulization 2 (two) times daily. 07/10/17   Chesley Mires, MD  dextromethorphan-guaiFENesin [facility] DM) 30-600 MG per 12 hr tablet Take 1 tablet by mouth every 12 (twelve) hours.      [provider]  DULoxetine (CYMBALTA) 20 MG capsule TAKE ONE CAPSULE BY MOUTH EVERY DAY 03/03/16   Lorella Nimrod, MD  ipratropium (ATROVENT) 0.02 % nebulizer solution TAKE 1 VIAL BY NEBULIZER EVERY 4 HOURS AS NEEDED 11/03/17   Chesley Mires, MD  meloxicam (MOBIC) 15 MG tablet Take 1 tablet (15 mg total) by mouth daily. 03/30/16   Triplett, Johnette Abraham B, FNP  omeprazole (PRILOSEC) 40 MG capsule TAKE 1 CAPSULE BY MOUTH  EVERY DAY 06/02/17   Chesley Mires, MD  predniSONE (DELTASONE) 5 MG tablet Take 1 tablet (5 mg total) by mouth daily with breakfast. 09/14/17   Chesley Mires, MD  zolpidem (AMBIEN) 5 MG tablet Take 5 mg by mouth at bedtime as needed for sleep.     [provider]    Family History Family History  Problem Relation Age of Onset  . Emphysema Father   . Prostate cancer Father   . Liver disease Father   . Emphysema Maternal Grandmother   . Diabetes Mother   . Heart disease Mother   . Colon cancer Neg Hx     Social History Social History   Tobacco Use  . Smoking status: Current Every Day Smoker    Packs/day: 0.25    Years: 43.00    Pack years: 10.75    Types: Cigarettes  .  Smokeless tobacco: Never Used  . Tobacco comment: cutting back  Substance Use Topics  . Alcohol use: No    Alcohol/week: 0.0 standard drinks  . Drug use: No     Allergies   Penicillins   Review of Systems Review of Systems  All systems reviewed and negative, other than as noted in HPI.  Physical Exam Updated Vital Signs BP (!) 172/83 (BP Location: Left Arm)   Pulse 99   Temp 98.5 F (36.9 C) (Oral)   Resp 13   Ht 5\' 3"  (1.6 m)   Wt 56.2 kg   LMP 07/18/2016   SpO2 98%   BMI 21.97 kg/m   Physical Exam  Constitutional: She appears well-developed and well-nourished. No distress.  HENT:  Head: Normocephalic and atraumatic.  Eyes: Conjunctivae are normal. Right eye exhibits no discharge. Left eye exhibits no discharge.  Neck: Neck supple.  Cardiovascular: Normal rate, regular rhythm and normal heart sounds. Exam reveals no gallop and no friction rub.  No murmur heard. Pulmonary/Chest: Effort normal. No respiratory distress.  Minimal expiratory wheezing bilaterally.  Abdominal: Soft. She exhibits no distension. There is no tenderness.  Musculoskeletal: She exhibits no edema or tenderness.  Lower extremities symmetric as compared to each other. No calf tenderness. Negative Homan's. No palpable cords.   Neurological: She is alert.  Skin: Skin is warm and dry.  Psychiatric: She has a normal mood and affect. Her behavior is normal. Thought content normal.  Nursing note and vitals reviewed.    ED Treatments / Results  Labs (all labs ordered are listed, but only abnormal results are displayed) Labs Reviewed  BASIC METABOLIC PANEL - Abnormal; Notable for the following components:      Result Value   Glucose, Bld 118 (*)    Creatinine, Ser 1.01 (*)    All other components within normal limits  CBC  D-DIMER, QUANTITATIVE (NOT AT [facility])  I-STAT BETA HCG BLOOD, ED ([facility], [facility], [facility] ONLY)    EKG EKG Interpretation  Date/Time:  Wednesday December 09 2017 15:57:47  EST Ventricular Rate:  95 PR Interval:    QRS Duration: 90 QT Interval:  356 QTC Calculation: 448 R Axis:   50 Text Interpretation:  Sinus rhythm Right atrial enlargement Probable left ventricular hypertrophy ST elev, probable normal early repol pattern No significant change since last tracing Confirmed by [place] Manifold [phone]) on 12/09/2017 4:15:36 PM   Radiology No results found.  Procedures Procedures (including critical care time)  Medications Ordered in ED Medications  albuterol (PROVENTIL) (2.5 MG/3ML) 0.083% nebulizer solution 5 mg (has no administration in time range)  alum &  mag hydroxide-simeth (MAALOX/MYLANTA) 200-200-20 MG/5ML suspension 30 mL (has no administration in time range)    And  lidocaine (XYLOCAINE) 2 % viscous mouth solution 15 mL (has no administration in time range)  ipratropium-albuterol (DUONEB) 0.5-2.5 (3) MG/3ML nebulizer solution 3 mL (has no administration in time range)  dexamethasone (DECADRON) tablet 8 mg (has no administration in time range)     Initial Impression / Assessment and Plan / ED Course  I have reviewed the triage vital signs and the nursing notes.  Pertinent labs & imaging results that were available during my care of the patient were reviewed by me and considered in my medical decision making (see chart for details).     52 year old female with multiple complaints.  Primarily sore throat and chest pain/"pain in my esophagus."  May have some degree of bronchitis.  The pain she is describing her throat and chest sounds more like likely esophagitis though.  Oropharynx is pretty unremarkable.  She has a past history of thrush.  I may end up treating her with nystatin to cover for this possibility.  Ports that symptoms began 4 weeks ago.  Per review of records there is some telephone encounters in the beginning of September noting feeling poorly, coughing a lot and some hemoptysis.  Does not appear that she actually followed up to be  seen as recommended.  She has a past history of PE.  She has a mild resting tachycardia.  She has no signs or symptoms of DVT.  We will check a d-dimer.  Plan to scan if elevated.  Final Clinical Impressions(s) / ED Diagnoses   Final diagnoses:  Chest pain, unspecified type  Esophagitis    ED Discharge Orders         Ordered    nystatin (MYCOSTATIN) 100000 UNIT/ML suspension  4 times daily     12/09/17 1954           [place] Manifold, MD 12/17/17 1031

## 2017-12-09 NOTE — ED Notes (Signed)
Patient verbalizes understanding of discharge instructions. Opportunity for questioning and answers were provided. Armband removed by staff, pt discharged from ED ambulatory w/ family member 

## 2018-01-18 ENCOUNTER — Emergency Department
Admission: EM | Admit: 2018-01-18 | Discharge: 2018-01-18 | Disposition: A | Discharge status: Home / Self Care | Payer: Medicare Other | Attending: Emergency Medicine | Admitting: Emergency Medicine

## 2018-01-18 ENCOUNTER — Encounter: Payer: Self-pay | Admitting: Emergency Medicine

## 2018-01-18 ENCOUNTER — Other Ambulatory Visit: Payer: Self-pay

## 2018-01-18 ENCOUNTER — Emergency Department: Payer: Medicare Other

## 2018-01-18 DIAGNOSIS — F17210 Nicotine dependence, cigarettes, uncomplicated: Secondary | ICD-10-CM | POA: Diagnosis not present

## 2018-01-18 DIAGNOSIS — Y999 Unspecified external cause status: Secondary | ICD-10-CM | POA: Insufficient documentation

## 2018-01-18 DIAGNOSIS — J441 Chronic obstructive pulmonary disease with (acute) exacerbation: Secondary | ICD-10-CM | POA: Diagnosis not present

## 2018-01-18 DIAGNOSIS — W260XXA Contact with knife, initial encounter: Secondary | ICD-10-CM | POA: Diagnosis not present

## 2018-01-18 DIAGNOSIS — Y929 Unspecified place or not applicable: Secondary | ICD-10-CM | POA: Insufficient documentation

## 2018-01-18 DIAGNOSIS — I10 Essential (primary) hypertension: Secondary | ICD-10-CM | POA: Diagnosis not present

## 2018-01-18 DIAGNOSIS — Y939 Activity, unspecified: Secondary | ICD-10-CM | POA: Diagnosis not present

## 2018-01-18 DIAGNOSIS — S61532A Puncture wound without foreign body of left wrist, initial encounter: Secondary | ICD-10-CM

## 2018-01-18 DIAGNOSIS — Z23 Encounter for immunization: Secondary | ICD-10-CM | POA: Diagnosis not present

## 2018-01-18 DIAGNOSIS — Z79899 Other long term (current) drug therapy: Secondary | ICD-10-CM | POA: Diagnosis not present

## 2018-01-18 DIAGNOSIS — Z7982 Long term (current) use of aspirin: Secondary | ICD-10-CM | POA: Insufficient documentation

## 2018-01-18 MED ORDER — TETANUS-DIPHTH-ACELL PERTUSSIS 5-2.5-18.5 LF-MCG/0.5 IM SUSP
0.5000 mL | Freq: Once | INTRAMUSCULAR | Status: AC
  Administered 2018-01-18: 0.5 mL via INTRAMUSCULAR
  Filled 2018-01-18: qty 0.5

## 2018-01-18 MED ORDER — SULFAMETHOXAZOLE-TRIMETHOPRIM 800-160 MG PO TABS: 1.0000 | ORAL_TABLET | Freq: Two times a day (BID) | ORAL | 0 refills | Status: DC

## 2018-01-18 MED ORDER — MELOXICAM 15 MG PO TABS: 15.0000 mg | ORAL_TABLET | Freq: Every day | ORAL | 0 refills | Status: DC

## 2018-01-18 NOTE — ED Provider Notes (Signed)
[facility] Emergency Department Provider Note  ____________________________________________  Time seen: Approximately 3:33 PM  I have reviewed the triage vital signs and the nursing notes.   HISTORY  Chief Complaint Puncture Wound    HPI Kimberly Kemp is a 52 y.o. female who presents emergency department complaining of pain, swelling to the dorsal aspect of the wrist.  Patient reports that she was opening a box with a knife in her kitchen yesterday.  Patient was distracted by her grandkids and knife slipped and punctured the dorsal aspect of her wrist.  Patient reports minimal bleeding.  Initially, she reports area was mildly sore but no other symptoms.  Today, patient reports that she has had swelling to the area but no ecchymosis.  She reports that pain has increased.  Patient denies any erythema or drainage to the region.  No systemic complaints.  No other injury or complaint at this time.    Past Medical History:  Diagnosis Date  . Anxiety   . Asthma   . Chronic pain   . Cirrhosis ([place])   . COPD (chronic obstructive pulmonary disease) ([place])   . Depression   . Emphysema   . GERD (gastroesophageal reflux disease)   . Hepatitis B   . Hypertension   . Paroxysmal atrial fibrillation (H[facility])   . Pulmonary embolism ([place])   . Stroke ([place])   . Tobacco abuse     Patient Active Problem List   Diagnosis Date Noted  . Generalized anxiety disorder 10/10/2015  . COPD exacerbation ([place]) 09/22/2015  . Essential hypertension 09/22/2015  . History of stroke 09/22/2015  . Leukocytosis 09/22/2015  . Acute respiratory failure ([place])   . Mixed hyperlipidemia   . Acute exacerbation of chronic obstructive pulmonary disease (COPD) ([place]) 09/04/2015  . Acute bronchitis 04/04/2015  . Hemoptysis 06/30/2014  . Thrush 07/22/2013  . Chest pain 04/27/2013  . GERD (gastroesophageal reflux disease) 06/18/2012  . Chronic cough 06/18/2012  . EMPHYSEMA 05/27/2007  . Tobacco  abuse 12/11/2006    Past Surgical History:  Procedure Laterality Date  . APPENDECTOMY     microscopic   . BRONCHOSCOPY  March 2008, Feb 2012  . CESAREAN SECTION      Prior to Admission medications   Medication Sig Start Date End Date Taking? Authorizing Provider  albuterol (PROAIR HFA) 108 (90 Base) MCG/ACT inhaler INHALE 2 PUFFS INTO THE LUNGS EVERY FOUR HOURS AS NEEDED FOR WHEEZING Patient taking differently: Inhale 2 puffs into the lungs every 4 (four) hours as needed for wheezing or shortness of breath.  08/07/17   Chesley Mires, MD  albuterol (PROVENTIL) (2.5 MG/3ML) 0.083% nebulizer solution INHALE 1 VIAL INTO THE LUNGS FOUR TIMES A DAY AS NEEDED Patient taking differently: Take 2.5 mg by nebulization 4 (four) times daily as needed for wheezing or shortness of breath.  10/06/17   Chesley Mires, MD  aspirin EC 81 MG tablet Take 81 mg by mouth every morning.    [provider]  atorvastatin (LIPITOR) 80 MG tablet Take 80 mg by mouth at bedtime. 10/17/17   [provider]  benzonatate (TESSALON) 100 MG capsule TAKE 1 CAPSULE EVERY 6 HOURS AS NEEDED COUGH. NEEDS OFFICE VISIT FOR REFILL. Patient taking differently: Take 100 mg by mouth every 6 (six) hours as needed for cough.  08/05/17   Chesley Mires, MD  budesonide (PULMICORT) 0.25 MG/2ML nebulizer solution Take 2 mLs (0.25 mg total) by nebulization 2 (two) times daily. 07/10/17   Chesley Mires, MD  butalbital-acetaminophen-caffeine (FIORICET, ESGIC) 50-325-40 MG tablet Take 1 tablet by mouth 3 (three) times daily as needed for headache. 11/19/17   [provider]  dextromethorphan-guaiFENesin (MUCINEX DM) 30-600 MG per 12 hr tablet Take 1 tablet by mouth every 12 (twelve) hours.      [provider]  DULoxetine (CYMBALTA) 20 MG capsule TAKE ONE CAPSULE BY MOUTH EVERY DAY Patient taking differently: Take 20 mg by mouth daily.  03/03/16   Lorella Nimrod, MD  gabapentin (NEURONTIN) 300 MG capsule Take 300 mg by mouth  at bedtime. 11/14/17   [provider]  hydrOXYzine (VISTARIL) 50 MG capsule Take 50 mg by mouth 2 (two) times daily as needed for anxiety. 11/11/17   [provider]  ipratropium (ATROVENT) 0.02 % nebulizer solution TAKE 1 VIAL BY NEBULIZER EVERY 4 HOURS AS NEEDED Patient taking differently: Take 0.5 mg by nebulization every 4 (four) hours as needed for wheezing or shortness of breath.  11/03/17   Chesley Mires, MD  meloxicam (MOBIC) 15 MG tablet Take 1 tablet (15 mg total) by mouth daily. 03/30/16   Triplett, Johnette Abraham B, FNP  nystatin (MYCOSTATIN) 100000 UNIT/ML suspension Take 5 mLs (500,000 Units total) by mouth 4 (four) times daily. Swish and swallow 12/09/17   Virgel Manifold, MD  omeprazole (PRILOSEC) 40 MG capsule TAKE 1 CAPSULE BY MOUTH EVERY DAY Patient taking differently: Take 40 mg by mouth daily.  06/02/17   Chesley Mires, MD  ondansetron (ZOFRAN) 4 MG tablet Take 4 mg by mouth 3 (three) times daily. 10/14/17   [provider]  PAZEO 0.7 % SOLN Place 1 drop into both eyes daily. 11/26/17   [provider]  predniSONE (DELTASONE) 5 MG tablet Take 1 tablet (5 mg total) by mouth daily with breakfast. 09/14/17   Chesley Mires, MD  Probiotic Product (PROBIOTIC-10 PO) Take 1 tablet by mouth daily.    [provider]  promethazine (PHENERGAN) 25 MG tablet Take 25 mg by mouth 4 (four) times daily. 11/09/17   [provider]  sulfamethoxazole-trimethoprim (BACTRIM DS,SEPTRA DS) 800-160 MG tablet Take 1 tablet by mouth 2 (two) times daily. 01/18/18   , Charline Bills, PA-C    Allergies Penicillins  Family History  Problem Relation Age of Onset  . Emphysema Father   . Prostate cancer Father   . Liver disease Father   . Emphysema Maternal Grandmother   . Diabetes Mother   . Heart disease Mother   . Colon cancer Neg Hx     Social History Social History   Tobacco Use  . Smoking status: Current Every Day Smoker    Packs/day: 0.25    Years:  43.00    Pack years: 10.75    Types: Cigarettes  . Smokeless tobacco: Never Used  . Tobacco comment: cutting back  Substance Use Topics  . Alcohol use: No    Alcohol/week: 0.0 standard drinks  . Drug use: No     Review of Systems  Constitutional: No fever/chills Eyes: No visual changes.  Cardiovascular: no chest pain. Respiratory: no cough. No SOB. Gastrointestinal: No abdominal pain.  No nausea, no vomiting.  Musculoskeletal: Negative for musculoskeletal pain. Skin: Positive for puncture wound to the dorsal left wrist Neurological: Negative for headaches, focal weakness or numbness. 10-point ROS otherwise negative.  ____________________________________________   PHYSICAL EXAM:  VITAL SIGNS: ED Triage Vitals  Enc Vitals Group     BP 01/18/18 1345 (!) 140/103     Pulse Rate 01/18/18 1345 99  Resp 01/18/18 1345 18     Temp 01/18/18 1345 98.3 F (36.8 C)     Temp Source 01/18/18 1345 Oral     SpO2 01/18/18 1345 98 %     Weight 01/18/18 1346 124 lb (56.2 kg)     Height 01/18/18 1346 5\' 3"  (1.6 m)     Head Circumference --      Peak Flow --      Pain Score 01/18/18 1346 9     Pain Loc --      Pain Edu? --      Excl. in [place]? --      Constitutional: Alert and oriented. Well appearing and in no acute distress. Eyes: Conjunctivae are normal. PERRL. EOMI. Head: Atraumatic. Neck: No stridor.    Cardiovascular: Normal rate, regular rhythm. Normal S1 and S2.  Good peripheral circulation. Respiratory: Normal respiratory effort without tachypnea or retractions. Lungs CTAB. Good air entry to the bases with no decreased or absent breath sounds. Musculoskeletal: Full range of motion to all extremities. No gross deformities appreciated.  Visualization of the left wrist reveals wound consistent with puncture wound.  No active bleeding.  No foreign body.  Scabbing in place.  Patient does have some edema to the region but no ecchymosis, erythema.  Palpation reveals significant  tenderness but no palpable fluctuance.  Patient is able to extend and flex all 5 digits appropriately.  Sensation intact all 5 digits.  Capillary refill intact all 5 digits. Neurologic:  Normal speech and language. No gross focal neurologic deficits are appreciated.  Skin:  Skin is warm, dry and intact. No rash noted. Psychiatric: Mood and affect are normal. Speech and behavior are normal. Patient exhibits appropriate insight and judgement.   ____________________________________________   LABS (all labs ordered are listed, but only abnormal results are displayed)  Labs Reviewed - No data to display ____________________________________________  EKG   ____________________________________________  RADIOLOGY I personally viewed and evaluated these images as part of my medical decision making, as well as reviewing the written report by the radiologist.  I concur with radiologist finding of no acute osseous abnormality and no retained foreign body.  Dg Wrist Complete Left  Result Date: 01/18/2018 CLINICAL DATA:  Puncture wound to left wrist. EXAM: LEFT WRIST - COMPLETE 3+ VIEW COMPARISON:  Radiographs of March 29, 2016. FINDINGS: There is no evidence of fracture or dislocation. There is no evidence of arthropathy or other focal bone abnormality. Soft tissues are unremarkable. IMPRESSION: Negative. Electronically Signed   By: Marijo Conception, M.D.   On: 01/18/2018 16:23    ____________________________________________    PROCEDURES  Procedure(s) performed:    .Splint Application Date/Time: 38/25/0539 4:43 PM Performed by: Darletta Moll, PA-C Authorized by: Darletta Moll, PA-C   Consent:    Consent obtained:  Verbal   Consent given by:  Patient   Risks discussed:  Pain and swelling Pre-procedure details:    Sensation:  Normal Procedure details:    Laterality:  Left   Location:  Wrist   Wrist:  L wrist   Splint type:  Wrist   Supplies:  Prefabricated  splint Post-procedure details:    Pain:  Improved   Sensation:  Normal   Patient tolerance of procedure:  Tolerated well, no immediate complications      Medications  Tdap (BOOSTRIX) injection 0.5 mL (0.5 mLs Intramuscular Given 01/18/18 1550)     ____________________________________________   INITIAL IMPRESSION / ASSESSMENT AND PLAN / ED COURSE  Pertinent labs &  imaging results that were available during my care of the patient were reviewed by me and considered in my medical decision making (see chart for details).  Review of the [place] CSRS was performed in accordance of the [place] prior to dispensing any controlled drugs.      Patient's diagnosis is consistent with puncture wound.  Patient presents emergency department complaining of puncture wound to the left wrist.  On exam, area does have edema but no signs of infection or ongoing hemorrhaging.  Given nature of injury and location, patient was evaluated with x-ray which reveals no osseous abnormality or retained foreign body.  No indication of acute ligamentous laceration as patient is able to flex and extend all digits appropriately.  Patient is given an updated tetanus shot at this time.  She will be placed on antibiotics prophylactically.  Velcro splint for protection.  Follow-up with hand surgery as needed.. Patient is given ED precautions to return to the ED for any worsening or new symptoms.     ____________________________________________  FINAL CLINICAL IMPRESSION(S) / ED DIAGNOSES  Final diagnoses:  Puncture wound of left wrist, initial encounter      NEW MEDICATIONS STARTED DURING THIS VISIT:  ED Discharge Orders         Ordered    sulfamethoxazole-trimethoprim (BACTRIM DS,SEPTRA DS) 800-160 MG tablet  2 times daily     01/18/18 1647              This chart was dictated using voice recognition software/Dragon. Despite best efforts to proofread, errors can occur which can change the meaning. Any change  was purely unintentional.    Darletta Moll, PA-C 01/18/18 1649    Nena Polio, MD 01/19/18 [phone]

## 2018-01-18 NOTE — ED Triage Notes (Signed)
Pt presents to ED with c/o puncture wound with knife to posterior L hand yesterday. Pt states was cutting a box of tape. No bleeding noted, associated swelling noted at this time.

## 2018-01-18 NOTE — ED Notes (Signed)
See triage note  Presents with puncture wound to left hand   States she was using a knife yesterday and slipped

## 2018-02-05 ENCOUNTER — Telehealth: Payer: Self-pay | Admitting: Pulmonary Disease

## 2018-02-05 NOTE — Telephone Encounter (Signed)
Called and spoke with pt stating to her that we needed her to come in for an appt for f/u. Pt stated to me that social services closed her medicaid and until she has her medicaid back, she is unable to afford to come in for an appt. Pt stated she is still waiting to hear if she will be able to get her medicaid back.  Pt stated to me that she needs her albuterol neb sol and wanted to know if VS would okay it to be filled even without an appt. Dr. Halford Chessman, please advise on this for pt. Thanks!

## 2018-02-05 NOTE — Telephone Encounter (Signed)
Pt was last seen by VS 09/14/17 and was told to return in 1 month. Pt has no showed for several OVs. Pt does not have a current f/u scheduled.   Attempted to call pt but unable to reach her. Left message for pt to return call.

## 2018-02-07 NOTE — Telephone Encounter (Signed)
Okay to send refill for albuterol.

## 2018-02-08 MED ORDER — ALBUTEROL SULFATE (2.5 MG/3ML) 0.083% IN NEBU: 2.5000 mg | INHALATION_SOLUTION | Freq: Four times a day (QID) | RESPIRATORY_TRACT | 3 refills | Status: DC | PRN

## 2018-02-08 NOTE — Telephone Encounter (Signed)
rx sent to preferred pharmacy.  Pt aware. Pt currently does not have insurance, is working to get insurance again.  Pt will call to schedule appt once her insurance is reinstated.    Nothing further needed at this time.

## 2018-02-17 DIAGNOSIS — J449 Chronic obstructive pulmonary disease, unspecified: Secondary | ICD-10-CM | POA: Diagnosis not present

## 2018-02-17 DIAGNOSIS — Z1389 Encounter for screening for other disorder: Secondary | ICD-10-CM | POA: Diagnosis not present

## 2018-02-17 DIAGNOSIS — E7800 Pure hypercholesterolemia, unspecified: Secondary | ICD-10-CM | POA: Diagnosis not present

## 2018-02-17 DIAGNOSIS — K219 Gastro-esophageal reflux disease without esophagitis: Secondary | ICD-10-CM | POA: Diagnosis not present

## 2018-02-17 DIAGNOSIS — E559 Vitamin D deficiency, unspecified: Secondary | ICD-10-CM | POA: Diagnosis not present

## 2018-02-24 DIAGNOSIS — J449 Chronic obstructive pulmonary disease, unspecified: Secondary | ICD-10-CM | POA: Diagnosis not present

## 2018-02-24 DIAGNOSIS — E042 Nontoxic multinodular goiter: Secondary | ICD-10-CM | POA: Diagnosis not present

## 2018-02-24 DIAGNOSIS — K219 Gastro-esophageal reflux disease without esophagitis: Secondary | ICD-10-CM | POA: Diagnosis not present

## 2018-02-24 DIAGNOSIS — E559 Vitamin D deficiency, unspecified: Secondary | ICD-10-CM | POA: Diagnosis not present

## 2018-03-01 ENCOUNTER — Other Ambulatory Visit: Payer: Self-pay | Admitting: Gastroenterology

## 2018-03-01 ENCOUNTER — Other Ambulatory Visit (HOSPITAL_COMMUNITY): Payer: Self-pay | Admitting: Gastroenterology

## 2018-03-01 DIAGNOSIS — R1310 Dysphagia, unspecified: Secondary | ICD-10-CM | POA: Diagnosis not present

## 2018-03-01 DIAGNOSIS — K5909 Other constipation: Secondary | ICD-10-CM | POA: Diagnosis not present

## 2018-03-01 DIAGNOSIS — K625 Hemorrhage of anus and rectum: Secondary | ICD-10-CM | POA: Diagnosis not present

## 2018-03-01 DIAGNOSIS — R1013 Epigastric pain: Secondary | ICD-10-CM

## 2018-03-01 DIAGNOSIS — R112 Nausea with vomiting, unspecified: Secondary | ICD-10-CM

## 2018-03-01 DIAGNOSIS — R630 Anorexia: Secondary | ICD-10-CM

## 2018-03-05 ENCOUNTER — Ambulatory Visit: Admission: RE | Admit: 2018-03-05 | Payer: Medicare Other | Source: Ambulatory Visit

## 2018-03-10 ENCOUNTER — Ambulatory Visit
Admission: RE | Admit: 2018-03-10 | Discharge: 2018-03-10 | Disposition: A | Discharge status: Home / Self Care | Payer: Medicare Other | Source: Ambulatory Visit | Attending: Gastroenterology | Admitting: Gastroenterology

## 2018-03-10 DIAGNOSIS — R630 Anorexia: Secondary | ICD-10-CM | POA: Diagnosis present

## 2018-03-10 DIAGNOSIS — R112 Nausea with vomiting, unspecified: Secondary | ICD-10-CM | POA: Insufficient documentation

## 2018-03-10 DIAGNOSIS — K625 Hemorrhage of anus and rectum: Secondary | ICD-10-CM | POA: Insufficient documentation

## 2018-03-10 DIAGNOSIS — D3500 Benign neoplasm of unspecified adrenal gland: Secondary | ICD-10-CM | POA: Diagnosis not present

## 2018-03-10 DIAGNOSIS — R1013 Epigastric pain: Secondary | ICD-10-CM | POA: Insufficient documentation

## 2018-03-10 MED ORDER — IOPAMIDOL (ISOVUE-300) INJECTION 61%
85.0000 mL | Freq: Once | INTRAVENOUS | Status: AC | PRN
  Administered 2018-03-10: 85 mL via INTRAVENOUS

## 2018-04-04 ENCOUNTER — Other Ambulatory Visit: Payer: Self-pay | Admitting: Pulmonary Disease

## 2018-04-15 DIAGNOSIS — J029 Acute pharyngitis, unspecified: Secondary | ICD-10-CM | POA: Diagnosis not present

## 2018-04-15 DIAGNOSIS — I4891 Unspecified atrial fibrillation: Secondary | ICD-10-CM | POA: Diagnosis not present

## 2018-04-15 DIAGNOSIS — J449 Chronic obstructive pulmonary disease, unspecified: Secondary | ICD-10-CM | POA: Diagnosis not present

## 2018-04-15 DIAGNOSIS — E559 Vitamin D deficiency, unspecified: Secondary | ICD-10-CM | POA: Diagnosis not present

## 2018-04-15 DIAGNOSIS — Z1389 Encounter for screening for other disorder: Secondary | ICD-10-CM | POA: Diagnosis not present

## 2018-04-18 ENCOUNTER — Other Ambulatory Visit: Payer: Self-pay | Admitting: Pulmonary Disease

## 2018-04-24 ENCOUNTER — Telehealth: Payer: Self-pay | Admitting: Pulmonary Disease

## 2018-04-24 NOTE — Telephone Encounter (Signed)
Patient called answering service  I did return patient's call at 12 noon Call went straight to voicemail, left a message that I was returning a call in response to call to the answering service

## 2018-04-24 NOTE — Telephone Encounter (Signed)
Patient stated she had been to see her primary doctor during last week Was told about a concern for meningitis Was placed on antibiotics-2 different antibiotics  Symptoms are worsening  Neck pain, nausea, she stated her throat is swelling  I did advise her that I am covering for Dr. Halford Chessman who is her pulmonologist She asked for advice about what to do and I did recommend that she go to the emergency room to be evaluated because of concern about her throat swelling and worsening symptoms

## 2018-04-26 ENCOUNTER — Telehealth: Payer: Self-pay | Admitting: Pulmonary Disease

## 2018-04-26 NOTE — Telephone Encounter (Signed)
Called and spoke with Patient.  Elizabeth Walsh, NP, recommendations given.  Understanding stated.  Nothing further at this time. 

## 2018-04-26 NOTE — Telephone Encounter (Signed)
Primary Pulmonologist: Sood Last office visit and with whom: 09/14/17, Sood What do we see them for (pulmonary problems): COPD Last OV assessment/plan:  Instructions      Return in about 1 month (around 10/15/2017).  Prednisone 5 mg pill >> 3 pills daily for 2 days, 2 pills daily for 2 days, then 1 pill daily until follow up   Follow up in 1 month with Dr. Halford Chessman or Nurse Practitioner       Was appointment offered to patient (explain)?  No, per office protocol   Reason for call:  Patient stated she was diagnosed with Meningitis a week ago, with [facility], Dr Margreta Journey.  Patient stated she was prescribed 2 antibiotics,  z pack, that she has finished, and Cephalexin, that she is currently taking.   Patient stated her symptoms are feeling worse.  Patient stated her throat feels sore, swollen, and raw.  Patient stated the back of her head is hurting.  Patient stated she is nauseated. Patient denies fever. Patient is not wanting to go out at this time. Patient was told by Dr Ander Slade to go to ED, 04/24/18.  Patient stated she thought about going, but changed her mind.  Patient has f/u with her PCP next month.  Per Dr Ander Slade 04/24/18- Patient stated she had been to see her primary doctor during last week Was told about a concern for meningitis Was placed on antibiotics-2 different antibiotics  Symptoms are worsening  Neck pain, nausea, she stated her throat is swelling  I did advise her that I am covering for Dr. Halford Chessman who is her pulmonologist She asked for advice about what to do and I did recommend that she go to the emergency room to be evaluated because of concern about her throat swelling and worsening symptoms   Message routed to Geraldo Pitter, NP

## 2018-04-26 NOTE — Telephone Encounter (Signed)
She should call her PCP and let them know that her symptoms are worse as they are the ones who diagnosed her and started her on ABX. Recommend she stay well hydrated. If she is having difficulty breathing, trouble swallowing, high fever, worsening neck pain or severe HA's she needs to go to ED.

## 2018-04-27 ENCOUNTER — Ambulatory Visit: Payer: Medicare Other | Admitting: Pulmonary Disease

## 2018-05-03 ENCOUNTER — Other Ambulatory Visit: Payer: Self-pay | Admitting: Pulmonary Disease

## 2018-05-05 ENCOUNTER — Ambulatory Visit: Admit: 2018-05-05 | Payer: Medicare Other | Admitting: Internal Medicine

## 2018-05-05 SURGERY — COLONOSCOPY WITH PROPOFOL
Anesthesia: General

## 2018-05-06 ENCOUNTER — Encounter: Payer: Self-pay | Admitting: Pulmonary Disease

## 2018-05-06 ENCOUNTER — Encounter: Payer: Self-pay | Admitting: *Deleted

## 2018-05-06 ENCOUNTER — Emergency Department: Payer: Medicare Other

## 2018-05-06 ENCOUNTER — Other Ambulatory Visit: Payer: Self-pay

## 2018-05-06 ENCOUNTER — Emergency Department
Admission: EM | Admit: 2018-05-06 | Discharge: 2018-05-06 | Disposition: A | Discharge status: Home / Self Care | Payer: Medicare Other | Attending: Emergency Medicine | Admitting: Emergency Medicine

## 2018-05-06 ENCOUNTER — Ambulatory Visit (INDEPENDENT_AMBULATORY_CARE_PROVIDER_SITE_OTHER): Payer: Medicare Other | Admitting: Pulmonary Disease

## 2018-05-06 DIAGNOSIS — Z720 Tobacco use: Secondary | ICD-10-CM | POA: Diagnosis not present

## 2018-05-06 DIAGNOSIS — J189 Pneumonia, unspecified organism: Secondary | ICD-10-CM

## 2018-05-06 DIAGNOSIS — Z79899 Other long term (current) drug therapy: Secondary | ICD-10-CM | POA: Diagnosis not present

## 2018-05-06 DIAGNOSIS — J181 Lobar pneumonia, unspecified organism: Secondary | ICD-10-CM | POA: Diagnosis not present

## 2018-05-06 DIAGNOSIS — Z7982 Long term (current) use of aspirin: Secondary | ICD-10-CM | POA: Insufficient documentation

## 2018-05-06 DIAGNOSIS — J438 Other emphysema: Secondary | ICD-10-CM

## 2018-05-06 DIAGNOSIS — J449 Chronic obstructive pulmonary disease, unspecified: Secondary | ICD-10-CM | POA: Insufficient documentation

## 2018-05-06 DIAGNOSIS — F17210 Nicotine dependence, cigarettes, uncomplicated: Secondary | ICD-10-CM | POA: Insufficient documentation

## 2018-05-06 DIAGNOSIS — I10 Essential (primary) hypertension: Secondary | ICD-10-CM | POA: Insufficient documentation

## 2018-05-06 DIAGNOSIS — R042 Hemoptysis: Secondary | ICD-10-CM

## 2018-05-06 DIAGNOSIS — R05 Cough: Secondary | ICD-10-CM | POA: Diagnosis not present

## 2018-05-06 DIAGNOSIS — R0602 Shortness of breath: Secondary | ICD-10-CM | POA: Diagnosis not present

## 2018-05-06 LAB — COMPREHENSIVE METABOLIC PANEL
ALT: 25 U/L (ref 0–44)
AST: 25 U/L (ref 15–41)
Albumin: 4.4 g/dL (ref 3.5–5.0)
Alkaline Phosphatase: 86 U/L (ref 38–126)
Anion gap: 11 (ref 5–15)
BUN: 17 mg/dL (ref 6–20)
CO2: 21 mmol/L — ABNORMAL LOW (ref 22–32)
Calcium: 9.7 mg/dL (ref 8.9–10.3)
Chloride: 105 mmol/L (ref 98–111)
Creatinine, Ser: 0.96 mg/dL (ref 0.44–1.00)
GFR calc Af Amer: >60 mL/min (ref >60)
GFR calc non Af Amer: >60 mL/min (ref >60)
Glucose, Bld: 104 mg/dL — ABNORMAL HIGH (ref 70–99)
Potassium: 4 mmol/L (ref 3.5–5.1)
Sodium: 137 mmol/L (ref 135–145)
Total Bilirubin: 0.7 mg/dL (ref 0.3–1.2)
Total Protein: 7.8 g/dL (ref 6.5–8.1)

## 2018-05-06 LAB — CBC
HCT: 38.4 % (ref 36.0–46.0)
Hemoglobin: 13.4 g/dL (ref 12.0–15.0)
MCH: 31.1 pg (ref 26.0–34.0)
MCHC: 34.9 g/dL (ref 30.0–36.0)
MCV: 89.1 fL (ref 80.0–100.0)
Platelets: 250 10*3/uL (ref 150–400)
RBC: 4.31 MIL/uL (ref 3.87–5.11)
RDW: 12.9 % (ref 11.5–15.5)
WBC: 8.2 10*3/uL (ref 4.0–10.5)
nRBC: 0 % (ref 0.0–0.2)

## 2018-05-06 LAB — TROPONIN I: Troponin I: <0.03 ng/mL (ref <0.03)

## 2018-05-06 MED ORDER — LEVOFLOXACIN 750 MG PO TABS
750.0000 mg | ORAL_TABLET | Freq: Once | ORAL | Status: AC
  Administered 2018-05-06: 750 mg via ORAL
  Filled 2018-05-06: qty 1

## 2018-05-06 MED ORDER — LEVOFLOXACIN 750 MG PO TABS: 750.0000 mg | ORAL_TABLET | Freq: Every day | ORAL | 0 refills | Status: AC

## 2018-05-06 NOTE — Assessment & Plan Note (Signed)
Assessment: mMRC 4 today Rescue inhaler every 4-6 hours scheduled due to need Patient reports that she has been managed on Pulmicort She is unsure if she is taking [place]vana Worsened shortness of breath per patient  Plan: Proceed to an emergency room for further evaluation Likely needs nebulized breathing treatments as well as steroids You need to stop smoking 1 week follow-up with Wyn Quaker, FNP telephonic outreach

## 2018-05-06 NOTE — Assessment & Plan Note (Signed)
Assessment: Continues to smoke Has been coughing up blood for the last 6 months Worsened shortness of breath over the last 6 months Laurence Ferrari use every 4 to 6 hours daily  Plan: He need to stop smoking We can further evaluate at next week's telephonic outreach call

## 2018-05-06 NOTE — ED Provider Notes (Signed)
[facility] Emergency Department Provider Note  Time seen: 5:36 PM  I have reviewed the triage vital signs and the nursing notes.   HISTORY  Chief Complaint Shortness of Breath and Cough    HPI Kimberly Kemp is a 53 y.o. female with a past medical history of asthma, anxiety, chronic pain, COPD, gastric reflux, hypertension, CVA, past PE, presents to the emergency department for cough and congestion.  According to the patient she has had a cough over the past 2 to 3 months.  States occasional blood specks in her sputum although denies any currently.  No chest pain.  No pleuritic pain.  Patient has a history of COPD/bronchitis.  States she is tried a course of steroids several weeks ago with minimal improvement.  Was supposed to see her doctor on the seventh but they could not get her into the office so I called her at home and recommended she come to the ER for evaluation.  Here the patient overall appears well, satting 99% on room air with reassuring vitals.  Patient denies any fever.  Denies any travel.  Denies any known sick exposure.  Past Medical History:  Diagnosis Date  . Anxiety   . Asthma   . Chronic pain   . Cirrhosis ([place])   . COPD (chronic obstructive pulmonary disease) ([place])   . Depression   . Emphysema   . GERD (gastroesophageal reflux disease)   . Hepatitis B   . Hypertension   . Paroxysmal atrial fibrillation (H[facility])   . Pulmonary embolism ([place])   . Stroke ([place])   . Tobacco abuse     Patient Active Problem List   Diagnosis Date Noted  . Generalized anxiety disorder 10/10/2015  . COPD exacerbation ([place]) 09/22/2015  . Essential hypertension 09/22/2015  . History of stroke 09/22/2015  . Leukocytosis 09/22/2015  . Acute respiratory failure ([place])   . Mixed hyperlipidemia   . Acute exacerbation of chronic obstructive pulmonary disease (COPD) ([place]) 09/04/2015  . Acute bronchitis 04/04/2015  . Hemoptysis 06/30/2014  . Thrush 07/22/2013  .  Chest pain 04/27/2013  . GERD (gastroesophageal reflux disease) 06/18/2012  . Chronic cough 06/18/2012  . EMPHYSEMA 05/27/2007  . Tobacco abuse 12/11/2006    Past Surgical History:  Procedure Laterality Date  . APPENDECTOMY     microscopic   . BRONCHOSCOPY  March 2008, Feb 2012  . CESAREAN SECTION      Prior to Admission medications   Medication Sig Start Date End Date Taking? Authorizing Provider  albuterol (PROAIR HFA) 108 (90 Base) MCG/ACT inhaler INHALE 2 PUFFS INTO THE LUNGS EVERY FOUR HOURS AS NEEDED FOR WHEEZING Patient taking differently: Inhale 2 puffs into the lungs every 4 (four) hours as needed for wheezing or shortness of breath.  08/07/17   Chesley Mires, MD  albuterol (PROVENTIL) (2.5 MG/3ML) 0.083% nebulizer solution Take 3 mLs (2.5 mg total) by nebulization 4 (four) times daily as needed for wheezing or shortness of breath. 02/08/18   Chesley Mires, MD  aspirin EC 81 MG tablet Take 81 mg by mouth every morning.    [provider]  atorvastatin (LIPITOR) 80 MG tablet Take 80 mg by mouth at bedtime. 10/17/17   [provider]  benzonatate (TESSALON) 100 MG capsule TAKE 1 CAPSULE EVERY 6 HOURS AS NEEDED COUGH. NEEDS OFFICE VISIT FOR REFILL. Patient taking differently: Take 100 mg by mouth every 6 (six) hours as needed for cough.  08/05/17   Chesley Mires, MD  budesonide (PULMICORT)  0.25 MG/2ML nebulizer solution TAKE 2 MLS (0.25 MG TOTAL) BY NEBULIZATION 2 (TWO) TIMES DAILY. 05/03/18   Chesley Mires, MD  butalbital-acetaminophen-caffeine (FIORICET, ESGIC) [phone] MG tablet Take 1 tablet by mouth 3 (three) times daily as needed for headache. 11/19/17   [provider]  dextromethorphan-guaiFENesin (MUCINEX DM) 30-600 MG per 12 hr tablet Take 1 tablet by mouth every 12 (twelve) hours.      [provider]  DULoxetine (CYMBALTA) 20 MG capsule TAKE ONE CAPSULE BY MOUTH EVERY DAY Patient taking differently: Take 20 mg by mouth daily.  03/03/16   Lorella Nimrod, MD  gabapentin (NEURONTIN) 300 MG capsule Take 300 mg by mouth at bedtime. 11/14/17   [provider]  hydrOXYzine (VISTARIL) 50 MG capsule Take 50 mg by mouth 2 (two) times daily as needed for anxiety. 11/11/17   [provider]  ipratropium (ATROVENT) 0.02 % nebulizer solution TAKE 1 VIAL BY NEBULIZER EVERY 4 HOURS AS NEEDED 04/07/18   Chesley Mires, MD  meloxicam (MOBIC) 15 MG tablet Take 1 tablet (15 mg total) by mouth daily. 01/18/18   Cuthriell, Charline Bills, PA-C  nystatin (MYCOSTATIN) 100000 UNIT/ML suspension Take 5 mLs (500,000 Units total) by mouth 4 (four) times daily. Swish and swallow 12/09/17   Virgel Manifold, MD  omeprazole (PRILOSEC) 40 MG capsule TAKE 1 CAPSULE BY MOUTH EVERY DAY Patient taking differently: Take 40 mg by mouth daily.  06/02/17   Chesley Mires, MD  ondansetron (ZOFRAN) 4 MG tablet Take 4 mg by mouth 3 (three) times daily. 10/14/17   [provider]  PAZEO 0.7 % SOLN Place 1 drop into both eyes daily. 11/26/17   [provider]  predniSONE (DELTASONE) 5 MG tablet Take 1 tablet (5 mg total) by mouth daily with breakfast. 09/14/17   Chesley Mires, MD  Probiotic Product (PROBIOTIC-10 PO) Take 1 tablet by mouth daily.    [provider]  promethazine (PHENERGAN) 25 MG tablet Take 25 mg by mouth 4 (four) times daily. 11/09/17   [provider]  sulfamethoxazole-trimethoprim (BACTRIM DS,SEPTRA DS) 800-160 MG tablet Take 1 tablet by mouth 2 (two) times daily. 01/18/18   Cuthriell, Charline Bills, PA-C    Allergies  Allergen Reactions  . Penicillins Hives    Has patient had a PCN reaction causing immediate rash, facial/tongue/throat swelling, SOB or lightheadedness with hypotension: Yes Has patient had a PCN reaction causing severe rash involving mucus membranes or skin necrosis: No Has patient had a PCN reaction that required hospitalization No Has patient had a PCN reaction occurring within the last 10 years: No If all of  the above answers are "NO", then may proceed with Cephalosporin use.     Family History  Problem Relation Age of Onset  . Emphysema Father   . Prostate cancer Father   . Liver disease Father   . Emphysema Maternal Grandmother   . Diabetes Mother   . Heart disease Mother   . Colon cancer Neg Hx     Social History Social History   Tobacco Use  . Smoking status: Current Every Day Smoker    Packs/day: 0.25    Years: 43.00    Pack years: 10.75    Types: Cigarettes  . Smokeless tobacco: Never Used  . Tobacco comment: cutting back  Substance Use Topics  . Alcohol use: No    Alcohol/week: 0.0 standard drinks  . Drug use: No    Review of Systems Constitutional: Negative for fever. Cardiovascular: Negative for chest pain.  Respiratory: Minimal shortness of breath.  Positive for cough Gastrointestinal: Negative for abdominal pain, vomiting and diarrhea. Musculoskeletal: Negative for musculoskeletal complaints Neurological: Negative for headache All other ROS negative  ____________________________________________   PHYSICAL EXAM:  VITAL SIGNS: ED Triage Vitals  Enc Vitals Group     BP 05/06/18 1643 (!) 147/84     Pulse Rate 05/06/18 1643 99     Resp 05/06/18 1643 20     Temp 05/06/18 1643 99 F (37.2 C)     Temp Source 05/06/18 1643 Oral     SpO2 05/06/18 1643 97 %     Weight 05/06/18 1644 124 lb (56.2 kg)     Height 05/06/18 1644 5\' 3"  (1.6 m)     Head Circumference --      Peak Flow --      Pain Score 05/06/18 1644 7     Pain Loc --      Pain Edu? --      Excl. in [place]? --    Constitutional: Alert and oriented. Well appearing and in no distress. Eyes: Normal exam ENT   Head: Normocephalic and atraumatic.   Mouth/Throat: Mucous membranes are moist. Cardiovascular: Normal rate, regular rhythm. No murmur Respiratory: Normal respiratory effort without tachypnea nor retractions. Breath sounds are clear, without current wheeze rales or  rhonchi. Gastrointestinal: Soft and nontender. No distention.   Musculoskeletal: Nontender with normal range of motion in all extremities.  Neurologic:  Normal speech and language. No gross focal neurologic deficits  Skin:  Skin is warm, dry and intact.  Psychiatric: Mood and affect are normal.   ____________________________________________   RADIOLOGY  Possible right early pneumonia  ____________________________________________   INITIAL IMPRESSION / ASSESSMENT AND PLAN / ED COURSE  Pertinent labs & imaging results that were available during my care of the patient were reviewed by me and considered in my medical decision making (see chart for details).  Patient presents to the emergency department for continued cough.  States cough has been ongoing for 2 to 3 months.  Denies any chest pain.  No fever.  No travel or sick exposures.  Patient does state occasional speck of blood in her sputum.  Currently the patient appears very well.  Differential this time would include pneumonia, bronchitis, COPD exacerbation, URI, viral illness, ACS.  We will check labs including cardiac enzymes obtain a chest x-ray and continue to closely monitor.  If work-up is reassuring anticipate likely discharge home with steroids and antibiotics with PCP follow-up.  At this time minimal suspicion for corona.  Possible indications of early pneumonia on the right side.  We will cover with antibiotics and have the patient follow-up with her doctor.  Patient agreeable to plan of care.  Kimberly Kemp was evaluated in Emergency Department on 05/06/2018 for the symptoms described in the history of present illness. She was evaluated in the context of the global COVID-19 pandemic, which necessitated consideration that the patient might be at risk for infection with the SARS-CoV-2 virus that causes COVID-19. Institutional protocols and algorithms that pertain to the evaluation of patients at risk for COVID-19 are in a state of  rapid change based on information released by regulatory bodies including the CDC and federal and state organizations. These policies and algorithms were followed during the patient's care in the ED.   ____________________________________________   FINAL CLINICAL IMPRESSION(S) / ED DIAGNOSES  Pneumonia   Harvest Dark, MD 05/06/18 1820

## 2018-05-06 NOTE — ED Notes (Signed)
Pt reports feeling sob for several weeks.  Sx worse this week.  No chest pain.  Pt has a nonproductive cough.  cig smoker.  Pt alert  Speech clear.

## 2018-05-06 NOTE — ED Notes (Signed)
States small amounts of blood in sputum with coughing. Denies fever, SOB or any contact with sick people. EDP at bedside.

## 2018-05-06 NOTE — ED Triage Notes (Signed)
Pt ambulatory to the tent   Pt reports sob and a cough for several weeks.  No chest pain   States not any better.  No fever.  cig smoker.  Pt alert  Speech clear.

## 2018-05-06 NOTE — Patient Instructions (Addendum)
We recommend today that you proceed forward to [facility] the emergency room for further evaluation of your hemoptysis, shortness of breath, and likely COPD exacerbation  Return in about 1 week (around 05/13/2018), or if symptoms worsen or fail to improve, for Follow up with Wyn Quaker FNP-C.   Coronavirus (COVID-19) Are you at risk?  Are you at risk for the Coronavirus (COVID-19)?  To be considered HIGH RISK for Coronavirus (COVID-19), you have to meet the following criteria:  . Traveled to [place], [place], [place], [place] or [place]; or in the [place] to [place], [place], [place], or [place]; and have fever, cough, and shortness of breath within the last 2 weeks of travel OR . Been in close contact with a person diagnosed with COVID-19 within the last 2 weeks and have fever, cough, and shortness of breath . IF YOU DO NOT MEET THESE CRITERIA, YOU ARE CONSIDERED LOW RISK FOR COVID-19.  What to do if you are HIGH RISK for COVID-19?  Marland Kitchen If you are having a medical emergency, call 911. . Seek medical care right away. Before you go to a doctor's office, urgent care or emergency department, call ahead and tell them about your recent travel, contact with someone diagnosed with COVID-19, and your symptoms. You should receive instructions from your physician's office regarding next steps of care.  . When you arrive at healthcare provider, tell the healthcare staff immediately you have returned from visiting [place], [place], [place], [place] or [place]; or traveled in the [place] to [place], [place], [place], or [place]; in the last two weeks or you have been in close contact with a person diagnosed with COVID-19 in the last 2 weeks.   . Tell the health care staff about your symptoms: fever, cough and shortness of breath. . After you have been seen by a medical provider, you will be either: o Tested for (COVID-19) and discharged home on quarantine except to  seek medical care if symptoms worsen, and asked to  - Stay home and avoid contact with others until you get your results (4-5 days)  - Avoid travel on public transportation if possible (such as bus, train, or airplane) or o Sent to the Emergency Department by EMS for evaluation, COVID-19 testing, and possible admission depending on your condition and test results.  What to do if you are LOW RISK for COVID-19?  Reduce your risk of any infection by using the same precautions used for avoiding the common cold or flu:  Marland Kitchen Wash your hands often with soap and warm water for at least 20 seconds.  If soap and water are not readily available, use an alcohol-based hand sanitizer with at least 60% alcohol.  . If coughing or sneezing, cover your mouth and nose by coughing or sneezing into the elbow areas of your shirt or coat, into a tissue or into your sleeve (not your hands). . Avoid shaking hands with others and consider head nods or verbal greetings only. . Avoid touching your eyes, nose, or mouth with unwashed hands.  . Avoid close contact with people who are sick. . Avoid places or events with large numbers of people in one location, like concerts or sporting events. . Carefully consider travel plans you have or are making. . If you are planning any travel outside or inside the [place], visit the CDC's Travelers' Health webpage for the latest health notices. . If you have some symptoms but not all symptoms,  continue to monitor at home and seek medical attention if your symptoms worsen. . If you are having a medical emergency, call 911.   [place] / e-Visit: [email or web address]         [facility]: [facility]: [phone]                   [facility]: [phone]           It is flu season:   >>> Best ways to protect herself from the  flu: Receive the yearly flu vaccine, practice good hand hygiene washing with soap and also using hand sanitizer when available, eat a nutritious meals, get adequate rest, hydrate appropriately   Please contact the office if your symptoms worsen or you have concerns that you are not improving.   Thank you for choosing [facility] for your healthcare, and for allowing [place] to partner with you on your healthcare journey. I am thankful to be able to provide care to you today.   Wyn Quaker FNP-C

## 2018-05-06 NOTE — Progress Notes (Signed)
Virtual Visit via Telephone Note  I connected with Kimberly Kemp on 05/06/18 at  3:30 PM EDT by telephone and verified that I am speaking with the correct person using two identifiers.   I discussed the limitations, risks, security and privacy concerns of performing an evaluation and management service by telephone and the availability of in person appointments. I also discussed with the patient that there may be a patient responsible charge related to this service. The patient expressed understanding and agreed to proceed.   History of Present Illness: 53 y.o. female smoker with cough, emphysema, rhinitis, and GERD.  Patient consented to consult via telephone: Yes People present and their role in pt care: Pt  Chief complaint: Emphysema   53 year old female current every day smoker reached today for a telephonic office visit.  She was last seen in August/2019 for COPD exacerbation.  Through chart review seems that patient was having acute symptoms in March/2020 was recommended to go to the emergency room.  Patient reports that she did not go to the emergency room as instructed.  She reports she has been using her rescue inhaler 4 to 6 hours scheduled for the last 6 months.  She also has been coughing up blood for the last 6 months.  She has not had any recent chest x-ray or CT imaging.  She continues to smoke.  She is having persistent shortness of breath.  She noticed that her breathing is worsened.  MMRC - Breathlessness Score 4 - I am too breathless to leave the house or I am breathlessness when dressing    Observations/Objective:  Cardiac tests: Echo 04/15/06>>EF 55 to 65%  Pulmonary tests A1AT 09/04/06 >> 236 Spirometry 10/26/09>>FEV1 2.59(88%), FEV1% 73 RAST 01/22/10 >> negative, IgE 11.9 Labs 01/22/10 >> ACE 46, RF < 20, ANA negative, ANCA negative CT chest 06/26/10 >> upper lobe emphysema CT chest 06/10/13 >> minimal centrilobular emphysema  Sleep tests Home sleep study  09/13/10>>AHI 1, SpO2 low 91%   No results found for: NITRICOXIDE    Assessment and Plan:  Hemoptysis Assessment: Has been coughing up blood for the last 6 months per patient Current smoker  Plan: Proceed to an emergency room for further evaluation, lab work and necessary imaging  Tobacco abuse Assessment: Continues to smoke Has been coughing up blood for the last 6 months Worsened shortness of breath over the last 6 months Saba use every 4 to 6 hours daily  Plan: He need to stop smoking We can further evaluate at next week's telephonic outreach call  EMPHYSEMA Assessment: mMRC 4 today Rescue inhaler every 4-6 hours scheduled due to need Patient reports that she has been managed on Pulmicort She is unsure if she is taking Brovana Worsened shortness of breath per patient  Plan: Proceed to an emergency room for further evaluation Likely needs nebulized breathing treatments as well as steroids You need to stop smoking 1 week follow-up with Wyn Quaker, FNP telephonic outreach    Follow Up Instructions:  Proceed to [facility] emergency room for further evaluation  Return in about 1 week (around 05/13/2018), or if symptoms worsen or fail to improve, for Follow up with Wyn Quaker FNP-C.    I discussed the assessment and treatment plan with the patient. The patient was provided an opportunity to ask questions and all were answered. The patient agreed with the plan and demonstrated an understanding of the instructions.   The patient was advised to call back or seek an in-person evaluation  if the symptoms worsen or if the condition fails to improve as anticipated.  I provided 22 minutes of non-face-to-face time during this encounter.   Kimberly Rinne, NP

## 2018-05-06 NOTE — Assessment & Plan Note (Signed)
Assessment: Has been coughing up blood for the last 6 months per patient Current smoker  Plan: Proceed to an emergency room for further evaluation, lab work and necessary imaging

## 2018-05-10 ENCOUNTER — Telehealth: Payer: Self-pay | Admitting: Pulmonary Disease

## 2018-05-10 ENCOUNTER — Telehealth: Payer: Self-pay

## 2018-05-10 NOTE — Telephone Encounter (Signed)
Pt advised to use drive-thru, wear a mask if one available. She ask about visiting grandchildren again advised to use a mask. Nothing further needed.

## 2018-05-10 NOTE — Telephone Encounter (Signed)
Left message informing patient NP Warner Mccreedy will be at hospital Wednesday and we need to r/s. She can have the tele-visit tomorrow instead of Wednesday if available.

## 2018-05-11 ENCOUNTER — Ambulatory Visit: Payer: Medicare Other | Admitting: Pulmonary Disease

## 2018-05-11 NOTE — Telephone Encounter (Signed)
Appt has been made. Nothing further is needed at this time.

## 2018-05-12 ENCOUNTER — Other Ambulatory Visit: Payer: Self-pay

## 2018-05-12 ENCOUNTER — Encounter: Payer: Self-pay | Admitting: Adult Health

## 2018-05-12 ENCOUNTER — Ambulatory Visit (INDEPENDENT_AMBULATORY_CARE_PROVIDER_SITE_OTHER): Payer: Medicare Other | Admitting: Adult Health

## 2018-05-12 ENCOUNTER — Ambulatory Visit: Payer: Medicare Other

## 2018-05-12 DIAGNOSIS — J189 Pneumonia, unspecified organism: Secondary | ICD-10-CM

## 2018-05-12 DIAGNOSIS — R042 Hemoptysis: Secondary | ICD-10-CM

## 2018-05-12 DIAGNOSIS — J441 Chronic obstructive pulmonary disease with (acute) exacerbation: Secondary | ICD-10-CM | POA: Diagnosis not present

## 2018-05-12 DIAGNOSIS — F17200 Nicotine dependence, unspecified, uncomplicated: Secondary | ICD-10-CM | POA: Diagnosis not present

## 2018-05-12 DIAGNOSIS — J181 Lobar pneumonia, unspecified organism: Secondary | ICD-10-CM

## 2018-05-12 MED ORDER — PROMETHAZINE-CODEINE 6.25-10 MG/5ML PO SYRP: 5.0000 mL | ORAL_SOLUTION | Freq: Four times a day (QID) | ORAL | 0 refills | Status: DC | PRN

## 2018-05-12 MED ORDER — BENZONATATE 200 MG PO CAPS: 200.0000 mg | ORAL_CAPSULE | Freq: Three times a day (TID) | ORAL | 3 refills | Status: DC | PRN

## 2018-05-12 NOTE — Patient Instructions (Addendum)
Finish Levaquin as directed.  Mucinex DM .Twice daily  As needed  Cough/congestion  Tessalon Three times a day  As needed  Cough  Restart Prednisone 5mg  daily .  Phenergan with codeine 1 tsp every 6hr as needed for severe cough , may make you sleepy .  Do not take this with other pain medications or sedating medications Work on not smoking . Follow up in 4 weeks with chest xray with Dr. Halford Chessman  Or Danamarie Minami NP and As needed   Please contact office for sooner follow up if symptoms do not improve or worsen or seek emergency care

## 2018-05-12 NOTE — Addendum Note (Signed)
Addended by: Valerie Salts on: 05/12/2018 12:10 PM   Modules accepted: Orders

## 2018-05-12 NOTE — Progress Notes (Signed)
Virtual Visit via Telephone Note  I connected with Kimberly Kemp on 05/12/18 at 11:00 AM EDT by telephone and verified that I am speaking with the correct person using two identifiers.   I discussed the limitations, risks, security and privacy concerns of performing an evaluation and management service by telephone and the availability of in person appointments. I also discussed with the patient that there may be a patient responsible charge related to this service. The patient expressed understanding and agreed to proceed.   History of Present Illness: Today's tele-visit is for follow-up of COPD, pneumonia and emergency room visit Patient is present for today's visit at home, myself present for today's visit at office  53 year old female active smoker followed for COPD/emphysema.   Patient had a COPD exacerbation last week with increased cough and persistent blood-tinged mucus.  She been coughing up blood over the on and off for the last 6 months.  She was sent to the emergency room.  Labs showed a normal CBC, C met and troponin.  Chest x-ray showed increased markings along the right lower lobe suspicious for pneumonia.  She was started Levaquin 783m daily for 7 days.  Patient has COPD with emphysema is on Pulmicort and Atrovent and albuterol nebulizers.  She is on chronic steroids with prednisone 5 mg daily. -did stop this briefly .  Says she is doing better. Blood tinged mucus has decreased. Still having cough that is keeping her up at night . No chest pain or calf pain .  Patient continues to smoke.  Smoking cessation was discussed Discussed COVID precautions   Observations/Objective: Echo 04/15/06>>EF 55 to 65%  Pulmonary tests A1AT 09/04/06 >> 236 Spirometry 10/26/09>>FEV1 2.59(88%), FEV1% 73 RAST 01/22/10 >> negative, IgE 11.9 Labs 01/22/10 >> ACE 46, RF < 20, ANA negative, ANCA negative CT chest 06/26/10 >> upper lobe emphysema CT chest 06/10/13 >> minimal centrilobular  emphysema  Sleep tests Home sleep study 09/13/10>>AHI 1, SpO2 low 91%  Assessment and Plan: COPD exacerbation +/- RLL CAP -improving with antibiotics follow up cxr on return  Add cough syrup for cough control, patient education on not to take with other sedating rx .   Blood tinged mucus - ? Related to recurrent COPD flare . Check CXR on return if not resolved, will need CT chest with contrast to evaluate as she is smoker .   Smoker  Smoking cessation   Plan  Patient Instructions  Finish Levaquin as directed.  Mucinex DM .Twice daily  As needed  Cough/congestion  Tessalon Three times a day  As needed  Cough  Restart Prednisone 570mdaily .  Phenergan with codeine 1 tsp every 6hr as needed for severe cough , may make you sleepy .  Do not take this with other pain medications or sedating medications Work on not smoking . Follow up in 4 weeks with chest xray with Dr. SoHalford ChessmanOr Parrett NP and As needed   Please contact office for sooner follow up if symptoms do not improve or worsen or seek emergency care       Follow Up Instructions: Follow-up in 4 weeks with chest x-ray and As needed   Please contact office for sooner follow up if symptoms do not improve or worsen or seek emergency care     I discussed the assessment and treatment plan with the patient. The patient was provided an opportunity to ask questions and all were answered. The patient agreed with the plan and demonstrated an understanding of  the instructions.   The patient was advised to call back or seek an in-person evaluation if the symptoms worsen or if the condition fails to improve as anticipated.  I provided 24 minutes of non-face-to-face time during this encounter.   Tammy Parrett, NP   

## 2018-05-19 DIAGNOSIS — E559 Vitamin D deficiency, unspecified: Secondary | ICD-10-CM | POA: Diagnosis not present

## 2018-05-19 DIAGNOSIS — E7800 Pure hypercholesterolemia, unspecified: Secondary | ICD-10-CM | POA: Diagnosis not present

## 2018-05-19 DIAGNOSIS — I4891 Unspecified atrial fibrillation: Secondary | ICD-10-CM | POA: Diagnosis not present

## 2018-05-19 DIAGNOSIS — J449 Chronic obstructive pulmonary disease, unspecified: Secondary | ICD-10-CM | POA: Diagnosis not present

## 2018-05-24 ENCOUNTER — Ambulatory Visit
Admission: RE | Admit: 2018-05-24 | Discharge: 2018-05-24 | Disposition: A | Discharge status: Home / Self Care | Payer: Medicare Other | Attending: Family Medicine | Admitting: Family Medicine

## 2018-05-24 ENCOUNTER — Ambulatory Visit
Admission: RE | Admit: 2018-05-24 | Discharge: 2018-05-24 | Disposition: A | Discharge status: Home / Self Care | Payer: Medicare Other | Source: Ambulatory Visit | Attending: Family Medicine | Admitting: Family Medicine

## 2018-05-24 ENCOUNTER — Other Ambulatory Visit: Payer: Self-pay | Admitting: Family Medicine

## 2018-05-24 ENCOUNTER — Other Ambulatory Visit: Payer: Self-pay

## 2018-05-24 DIAGNOSIS — Z831 Family history of other infectious and parasitic diseases: Secondary | ICD-10-CM | POA: Diagnosis not present

## 2018-05-24 DIAGNOSIS — Z8701 Personal history of pneumonia (recurrent): Secondary | ICD-10-CM | POA: Diagnosis not present

## 2018-05-24 DIAGNOSIS — J189 Pneumonia, unspecified organism: Secondary | ICD-10-CM | POA: Diagnosis not present

## 2018-05-24 DIAGNOSIS — Z09 Encounter for follow-up examination after completed treatment for conditions other than malignant neoplasm: Secondary | ICD-10-CM | POA: Diagnosis not present

## 2018-05-29 ENCOUNTER — Other Ambulatory Visit: Payer: Self-pay | Admitting: Pulmonary Disease

## 2018-06-11 ENCOUNTER — Encounter: Payer: Self-pay | Admitting: Adult Health

## 2018-06-11 ENCOUNTER — Other Ambulatory Visit: Payer: Self-pay

## 2018-06-11 ENCOUNTER — Ambulatory Visit (INDEPENDENT_AMBULATORY_CARE_PROVIDER_SITE_OTHER): Payer: Medicare Other | Admitting: Adult Health

## 2018-06-11 DIAGNOSIS — J189 Pneumonia, unspecified organism: Secondary | ICD-10-CM

## 2018-06-11 DIAGNOSIS — R042 Hemoptysis: Secondary | ICD-10-CM

## 2018-06-11 DIAGNOSIS — J441 Chronic obstructive pulmonary disease with (acute) exacerbation: Secondary | ICD-10-CM

## 2018-06-11 DIAGNOSIS — J181 Lobar pneumonia, unspecified organism: Secondary | ICD-10-CM

## 2018-06-11 NOTE — Addendum Note (Signed)
Addended by: Parke Poisson E on: 06/11/2018 11:41 AM   Modules accepted: Orders

## 2018-06-11 NOTE — Patient Instructions (Addendum)
Continue on Budesonide Neb Twice daily  .  Continue on Albuterol and Ipratropium Nebs Four times a day  .  Work on not smoking .  Set up for CT chest .  Follow up in office with Dr. Halford Chessman  In 4 weeks and As needed   Please contact office for sooner follow up if symptoms do not improve or worsen or seek emergency care    Will need PFT in future once restrictions lifted.

## 2018-06-11 NOTE — Progress Notes (Signed)
@Patient  ID: Kimberly Kemp, female    DOB: 10/07/1965, 53 y.o.   MRN: [id number]  Chief Complaint  Patient presents with  . Follow-up    COPD     Referring provider: No ref. provider found  HPI: 53 year old female active smoker followed for COPD and emphysema.  TEST/EVENTS :  Echo 04/15/06>>EF 55 to 65%  Pulmonary tests A1AT 09/04/06 >> 236 Spirometry 10/26/09>>FEV1 2.59(88%), FEV1% 73 RAST 01/22/10 >> negative, IgE 11.9 Labs 01/22/10 >> ACE 46, RF < 20, ANA negative, ANCA negative CT chest 06/26/10 >> upper lobe emphysema CT chest 06/10/13 >> minimal centrilobular emphysema  Sleep tests Home sleep study 09/13/10>>AHI 1, SpO2 low 91%  06/11/2018 Follow up : COPD and PNA  Patient returns for a one-month follow-up.  Patient was recently seen for a tele-visit for COPD exacerbation and pneumonia.  Patient had some blood-tinged mucus.  She was seen in the emergency room and diagnosed with a right lower lobe pneumonia.  She was treated with Levaquin 750 mg for 7 days.  Since last visit.  Patient says she is feeling better with less cough and congestion . Patient has had decreased blood tinged mucus ,  None for 3 days. Does have CAD/PVD on daily aspirin 81mg  daily. No frank hemopytsis. This blood tinged mucus has been on/off for last 6 months . Has chronic hoarseness.  Weight has been steady with no weight loss.  Patient does continue to smoke.  Smoking cessation was discussed. Remains Budesonide Neb Twice daily  , Atrovent /Albuterol Neb Four times a day   Was previously on prednisone 5mg  daily , but has stopped this . Does not want to be on steroids due to immune system.    Patient had a repeat chest x-ray done on 05/24/18 by her primary care provider.  This showed resolution of the right lower lobe pneumonia.  With no evidence of focal airspace disease or suspicious nodules or masses.   Allergies  Allergen Reactions  . Penicillins Hives    Has patient had a PCN reaction causing  immediate rash, facial/tongue/throat swelling, SOB or lightheadedness with hypotension: Yes Has patient had a PCN reaction causing severe rash involving mucus membranes or skin necrosis: No Has patient had a PCN reaction that required hospitalization No Has patient had a PCN reaction occurring within the last 10 years: No If all of the above answers are "NO", then may proceed with Cephalosporin use.     Immunization History  Administered Date(s) Administered  . Influenza Split 11/05/2011  . Influenza,inj,Quad PF,6+ Mos 11/03/2012  . Pneumococcal Polysaccharide-23 11/04/2003  . Tdap 01/18/2018    Past Medical History:  Diagnosis Date  . Anxiety   . Asthma   . Chronic pain   . Cirrhosis ([place])   . COPD (chronic obstructive pulmonary disease) ([place])   . Depression   . Emphysema   . GERD (gastroesophageal reflux disease)   . Hepatitis B   . Hypertension   . Paroxysmal atrial fibrillation ([facility])   . Pulmonary embolism ([place])   . Stroke ([place])   . Tobacco abuse     Tobacco History: Social History   Tobacco Use  Smoking Status Current Every Day Smoker  . Packs/day: 0.25  . Years: 43.00  . Pack years: 10.75  . Types: Cigarettes  Smokeless Tobacco Never Used  Tobacco Comment   cutting back   Ready to quit: No Counseling given: Yes Comment: cutting back   Outpatient Medications Prior to Visit  Medication Sig Dispense  Refill  . albuterol (PROAIR HFA) 108 (90 Base) MCG/ACT inhaler INHALE 2 PUFFS INTO THE LUNGS EVERY FOUR HOURS AS NEEDED FOR WHEEZING (Patient taking differently: Inhale 2 puffs into the lungs every 4 (four) hours as needed for wheezing or shortness of breath. ) 8.5 Inhaler 5  . albuterol (PROVENTIL) (2.5 MG/3ML) 0.083% nebulizer solution Take 3 mLs (2.5 mg total) by nebulization 4 (four) times daily as needed for wheezing or shortness of breath. 360 mL 3  . aspirin EC 81 MG tablet Take 81 mg by mouth every morning.    Marland Kitchen atorvastatin (LIPITOR) 80 MG tablet Take  80 mg by mouth at bedtime.  5  . benzonatate (TESSALON) 200 MG capsule Take 1 capsule (200 mg total) by mouth 3 (three) times daily as needed for cough. 30 capsule 3  . budesonide (PULMICORT) 0.25 MG/2ML nebulizer solution TAKE 2 MLS (0.25 MG TOTAL) BY NEBULIZATION 2 (TWO) TIMES DAILY. 120 mL 1  . butalbital-acetaminophen-caffeine (FIORICET, ESGIC) 50-325-40 MG tablet Take 1 tablet by mouth 3 (three) times daily as needed for headache.  2  . dextromethorphan-guaiFENesin (MUCINEX DM) 30-600 MG per 12 hr tablet Take 1 tablet by mouth every 12 (twelve) hours.      . DULoxetine (CYMBALTA) 20 MG capsule TAKE ONE CAPSULE BY MOUTH EVERY DAY (Patient taking differently: Take 20 mg by mouth daily. ) 30 capsule 3  . gabapentin (NEURONTIN) 300 MG capsule Take 300 mg by mouth at bedtime.  1  . hydrOXYzine (VISTARIL) 50 MG capsule Take 50 mg by mouth 2 (two) times daily as needed for anxiety.  2  . ipratropium (ATROVENT) 0.02 % nebulizer solution TAKE 1 VIAL BY NEBULIZER EVERY 4 HOURS AS NEEDED 437.5 mL 1  . meloxicam (MOBIC) 15 MG tablet Take 1 tablet (15 mg total) by mouth daily. 30 tablet 0  . nystatin (MYCOSTATIN) 100000 UNIT/ML suspension Take 5 mLs (500,000 Units total) by mouth 4 (four) times daily. Swish and swallow 473 mL 0  . omeprazole (PRILOSEC) 40 MG capsule TAKE 1 CAPSULE BY MOUTH EVERY DAY (Patient taking differently: Take 40 mg by mouth daily. ) 30 capsule 0  . ondansetron (ZOFRAN) 4 MG tablet Take 4 mg by mouth 3 (three) times daily.  0  . PAZEO 0.7 % SOLN Place 1 drop into both eyes daily.    . Probiotic Product (PROBIOTIC-10 PO) Take 1 tablet by mouth daily.    . promethazine (PHENERGAN) 25 MG tablet Take 25 mg by mouth 4 (four) times daily.  2  . promethazine-codeine (PHENERGAN WITH CODEINE) 6.25-10 MG/5ML syrup Take 5 mLs by mouth every 6 (six) hours as needed for cough. 120 mL 0  . benzonatate (TESSALON) 100 MG capsule TAKE 1 CAPSULE EVERY 6 HOURS AS NEEDED COUGH. NEEDS OFFICE VISIT FOR  REFILL. (Patient not taking: No sig reported) 30 capsule 0   No facility-administered medications prior to visit.      Review of Systems:   Constitutional:   No  weight loss, night sweats,  Fevers, chills,  +fatigue, or  lassitude.  HEENT:   No headaches,  Difficulty swallowing,  Tooth/dental problems, or  Sore throat,                No sneezing, itching, ear ache, nasal congestion, post nasal drip,   CV:  No chest pain,  Orthopnea, PND, swelling in lower extremities, anasarca, dizziness, palpitations, syncope.   GI  No heartburn, indigestion, abdominal pain, nausea, vomiting, diarrhea, change in bowel habits, loss of  appetite, bloody stools.   Resp:   No chest wall deformity  Skin: no rash or lesions.  GU: no dysuria, change in color of urine, no urgency or frequency.  No flank pain, no hematuria   MS:  No joint pain or swelling.  No decreased range of motion.  No back pain.    Physical Exam  BP 140/86 (BP Location: Left Arm, Cuff Size: Normal)   Pulse 85   Temp 98 F (36.7 C) (Oral)   Ht 5\' 3"  (1.6 m)   Wt 133 lb 9.6 oz (60.6 kg)   LMP 07/18/2016   SpO2 99%   BMI 23.67 kg/m   GEN: A/Ox3; pleasant , NAD, thin female    HEENT:  [place]/AT,   NOSE-clear, THROAT-clear, no lesions, no postnasal drip or exudate noted.  Poor dentition   NECK:  Supple w/ fair ROM; no JVD; normal carotid impulses w/o bruits; no thyromegaly or nodules palpated; no lymphadenopathy.    RESP  Clear  P & A; w/o, wheezes/ rales/ or rhonchi. no accessory muscle use, no dullness to percussion  CARD:  RRR, no m/r/g, no peripheral edema, pulses intact, no cyanosis or clubbing.  GI:   Soft & nt; nml bowel sounds; no organomegaly or masses detected.   Musco: Warm bil, no deformities or joint swelling noted.   Neuro: alert, no focal deficits noted.    Skin: Warm, no lesions or rashes    Lab Results:  CBC    Component Value Date/Time   WBC 8.2 05/06/2018 1734   RBC 4.31 05/06/2018 1734    HGB 13.4 05/06/2018 1734   HCT 38.4 05/06/2018 1734   PLT 250 05/06/2018 1734   MCV 89.1 05/06/2018 1734   MCH 31.1 05/06/2018 1734   MCHC 34.9 05/06/2018 1734   RDW 12.9 05/06/2018 1734   LYMPHSABS 2.9 08/08/2016 1258   MONOABS 0.9 08/08/2016 1258   EOSABS 0.4 08/08/2016 1258   BASOSABS 0.1 08/08/2016 1258    BMET    Component Value Date/Time   NA 137 05/06/2018 1734   K 4.0 05/06/2018 1734   CL 105 05/06/2018 1734   CO2 21 (L) 05/06/2018 1734   GLUCOSE 104 (H) 05/06/2018 1734   BUN 17 05/06/2018 1734   CREATININE 0.96 05/06/2018 1734   CALCIUM 9.7 05/06/2018 1734   GFRNONAA >60 05/06/2018 1734   GFRAA >60 05/06/2018 1734    BNP No results found for: BNP  ProBNP    Component Value Date/Time   PROBNP 58.2 01/14/2010 1052    Imaging: Dg Chest 2 View  Result Date: 05/24/2018 CLINICAL DATA:  Follow-up pneumonia. EXAM: CHEST - 2 VIEW COMPARISON:  05/06/2018 FINDINGS: The cardiomediastinal silhouette is unremarkable. RIGHT LOWER lung airspace opacity has resolved. There is no evidence of focal airspace disease, pulmonary edema, suspicious pulmonary nodule/mass, pleural effusion, or pneumothorax. No acute bony abnormalities are identified. IMPRESSION: No evidence of active cardiopulmonary disease. Resolved RIGHT LOWER lung pneumonia. Electronically Signed   By: Margarette Canada M.D.   On: 05/24/2018 10:47      No flowsheet data found.  No results found for: NITRICOXIDE      Assessment & Plan:   COPD exacerbation ([place]) Recent exacerbation with right lower lobe pneumonia now improved.  Patient had passive leg raise blood-tinged mucus.  This is also been chronic over the last 6 months.  Will need further investigation as patient is an active smoker.  Would recommend a CT chest.  And pending these  results decide on  possible undergoing a bronchoscopy if blood-tinged mucus persists. Will need PFTs once COVID-19 restrictions are lifted  Plan  Patient Instructions  Continue  on Budesonide Neb Twice daily  .  Continue on Albuterol and Ipratropium Nebs Four times a day  .  Work on not smoking .  Set up for CT chest .  Follow up in office with Dr. Halford Chessman  In 4 weeks and As needed   Please contact office for sooner follow up if symptoms do not improve or worsen or seek emergency care    Will need PFT in future once restrictions lifted.       Hemoptysis Blood-tinged mucus seems to be improving.  However is been ongoing over the last 6 months.  This seems to be improved after recent right lower lobe pneumonia.  Chest x-ray showed resolution of right lower lobe airspace disease.  However with patient active smoking she is at increased risk for underlying malignancy.  We will set up a CT chest with contrast.  Once these results are available.  We will decide if she needs to undergo a bronchoscopy. She has no active hemoptysis.  She does have significant coronary artery disease and peripheral vascular disease.  Will continue on aspirin last she becomes anemic or hemoptysis increases   Plan  Patient Instructions  Continue on Budesonide Neb Twice daily  .  Continue on Albuterol and Ipratropium Nebs Four times a day  .  Work on not smoking .  Set up for CT chest .  Follow up in office with Dr. Halford Chessman  In 4 weeks and As needed   Please contact office for sooner follow up if symptoms do not improve or worsen or seek emergency care    Will need PFT in future once restrictions lifted.       Right lower lobe pneumonia ([place]) Right lower lobe pneumonia.  Improved clinically on Levaquin.  Chest x-ray showed clearance of right lower lobe airspace disease.    Plan  Patient Instructions  Continue on Budesonide Neb Twice daily  .  Continue on Albuterol and Ipratropium Nebs Four times a day  .  Work on not smoking .  Set up for CT chest .  Follow up in office with Dr. Halford Chessman  In 4 weeks and As needed   Please contact office for sooner follow up if symptoms do not improve or  worsen or seek emergency care    Will need PFT in future once restrictions lifted.          Rexene Edison, NP 06/11/2018

## 2018-06-11 NOTE — Assessment & Plan Note (Signed)
Recent exacerbation with right lower lobe pneumonia now improved.  Patient had passive leg raise blood-tinged mucus.  This is also been chronic over the last 6 months.  Will need further investigation as patient is an active smoker.  Would recommend a CT chest.  And pending these  results decide on possible undergoing a bronchoscopy if blood-tinged mucus persists. Will need PFTs once COVID-19 restrictions are lifted  Plan  Patient Instructions  Continue on Budesonide Neb Twice daily  .  Continue on Albuterol and Ipratropium Nebs Four times a day  .  Work on not smoking .  Set up for CT chest .  Follow up in office with Dr. Halford Chessman  In 4 weeks and As needed   Please contact office for sooner follow up if symptoms do not improve or worsen or seek emergency care    Will need PFT in future once restrictions lifted.

## 2018-06-11 NOTE — Assessment & Plan Note (Signed)
Blood-tinged mucus seems to be improving.  However is been ongoing over the last 6 months.  This seems to be improved after recent right lower lobe pneumonia.  Chest x-ray showed resolution of right lower lobe airspace disease.  However with patient active smoking she is at increased risk for underlying malignancy.  We will set up a CT chest with contrast.  Once these results are available.  We will decide if she needs to undergo a bronchoscopy. She has no active hemoptysis.  She does have significant coronary artery disease and peripheral vascular disease.  Will continue on aspirin last she becomes anemic or hemoptysis increases   Plan  Patient Instructions  Continue on Budesonide Neb Twice daily  .  Continue on Albuterol and Ipratropium Nebs Four times a day  .  Work on not smoking .  Set up for CT chest .  Follow up in office with Dr. Halford Chessman  In 4 weeks and As needed   Please contact office for sooner follow up if symptoms do not improve or worsen or seek emergency care    Will need PFT in future once restrictions lifted.

## 2018-06-11 NOTE — Assessment & Plan Note (Signed)
Right lower lobe pneumonia.  Improved clinically on Levaquin.  Chest x-ray showed clearance of right lower lobe airspace disease.    Plan  Patient Instructions  Continue on Budesonide Neb Twice daily  .  Continue on Albuterol and Ipratropium Nebs Four times a day  .  Work on not smoking .  Set up for CT chest .  Follow up in office with Dr. Halford Chessman  In 4 weeks and As needed   Please contact office for sooner follow up if symptoms do not improve or worsen or seek emergency care    Will need PFT in future once restrictions lifted.

## 2018-06-18 ENCOUNTER — Telehealth: Payer: Self-pay | Admitting: Adult Health

## 2018-06-18 ENCOUNTER — Other Ambulatory Visit: Payer: Self-pay

## 2018-06-18 ENCOUNTER — Ambulatory Visit
Admission: RE | Admit: 2018-06-18 | Discharge: 2018-06-18 | Disposition: A | Discharge status: Home / Self Care | Payer: Medicare Other | Source: Ambulatory Visit | Attending: Adult Health | Admitting: Adult Health

## 2018-06-18 ENCOUNTER — Ambulatory Visit: Payer: Medicare Other

## 2018-06-18 DIAGNOSIS — R042 Hemoptysis: Secondary | ICD-10-CM

## 2018-06-18 MED ORDER — DIPHENHYDRAMINE HCL 50 MG PO TABS: ORAL_TABLET | ORAL | 0 refills | Status: DC

## 2018-06-18 MED ORDER — PREDNISONE 50 MG PO TABS: ORAL_TABLET | ORAL | 0 refills | Status: DC

## 2018-06-18 NOTE — Telephone Encounter (Signed)
Received a call from [facility] with [place] who stated that they had to reschedule pt's CT which was supposed to have been performed today 5/15. Colletta Maryland stated the scan had to be rescheduled due to side effects pt had from a previous CT.  Due to this, pt's scan has now been rescheduled for 07/12/2018. Colletta Maryland stated that pt needs to be medicated prior to having CT performed and is asking for [place] to call in prednisone and benadryl for pt for her to be able to premedicate herself prior to the CT. The CT will be performed 6/8 at [facility], please advise if you are okay with sending Benadryl and Prednisone to pt's pharmacy of [facility] for premedication prior to her CT. Thanks!

## 2018-06-18 NOTE — Telephone Encounter (Signed)
Yes. This is ok as long as patient does not have allergies listed for these medications. Please send per CT protocol. Thanks.

## 2018-06-18 NOTE — Telephone Encounter (Signed)
alled and spoke with Colletta Maryland from [facility] to clarify how much benadryl and prednisone needed to be called in for pt for the premedication prior to CT. Colletta Maryland stated that we needed to call in 50mg  benadryl for pt to take 1 hour prior to the CT ad needed to call in 50mg  prednisone x3 tablets. With the prednisone, pt will need to take 1 tablet 13 hours prior to the CT, another tablet 7 hours prior to the CT, and the last tablet would need to be taken 1 hour prior to the CT.  Called and spoke with pt letting her know that we were going to send in meds for her to pharmacy for premedication prior to CT 6/8. I verbally told pt the meds and the instructions on how the meds were to be taken but have also made sure the instructions were on both Rx. I have sent meds to pharmacy for pt. I also rescheduled pt's appt which was originally scheduled with TP 6/5 and have scheduled pt's appt with TP 6/10 that way the appt can be after CT so TP can go over results of scan with pt. Nothing further needed.

## 2018-06-23 ENCOUNTER — Other Ambulatory Visit: Payer: Self-pay | Admitting: Pulmonary Disease

## 2018-06-27 ENCOUNTER — Other Ambulatory Visit: Payer: Self-pay | Admitting: Pulmonary Disease

## 2018-07-06 ENCOUNTER — Other Ambulatory Visit: Payer: Self-pay | Admitting: Pulmonary Disease

## 2018-07-08 ENCOUNTER — Other Ambulatory Visit: Payer: Self-pay | Admitting: Pulmonary Disease

## 2018-07-08 ENCOUNTER — Telehealth: Payer: Self-pay | Admitting: Adult Health

## 2018-07-08 MED ORDER — PROMETHAZINE-CODEINE 6.25-10 MG/5ML PO SYRP: 5.0000 mL | ORAL_SOLUTION | Freq: Four times a day (QID) | ORAL | 0 refills | Status: DC | PRN

## 2018-07-08 NOTE — Telephone Encounter (Signed)
That is fine. Done

## 2018-07-08 NOTE — Telephone Encounter (Signed)
Called and spoke with pt in regards to the benadryl and prednisone Rx and stated to her due to her having a prior reaction to contrast from a recent CT, she would need to take the benadryl and prednisone as directed on the Rx which was sent to pharmacy for her 5/15 that she picked up. Stated to her that these meds as directed on the prescription bottles for premedication prior to her CT and pt verbalized understanding.   While speaking with pt, pt stated that the cough syrup which was previously prescribed 05/12/2018 which was promethazine-codeine 6.25-10mg /76ml has really helped with her cough and she is requesting to have a refill sent to pharmacy. Pharmacy this needs to be sent to is [facility]. Tammy, please advise if you are okay refilling med for pt. Thanks!

## 2018-07-08 NOTE — Telephone Encounter (Signed)
Pt aware. Nothing further needed 

## 2018-07-09 ENCOUNTER — Other Ambulatory Visit (HOSPITAL_COMMUNITY)
Admission: RE | Admit: 2018-07-09 | Discharge: 2018-07-09 | Disposition: A | Discharge status: Home / Self Care | Payer: Medicare Other | Source: Ambulatory Visit | Attending: Pulmonary Disease | Admitting: Pulmonary Disease

## 2018-07-09 ENCOUNTER — Ambulatory Visit: Payer: Medicare Other | Admitting: Adult Health

## 2018-07-09 ENCOUNTER — Other Ambulatory Visit: Payer: Self-pay

## 2018-07-09 DIAGNOSIS — Z01812 Encounter for preprocedural laboratory examination: Secondary | ICD-10-CM | POA: Insufficient documentation

## 2018-07-09 DIAGNOSIS — Z1159 Encounter for screening for other viral diseases: Secondary | ICD-10-CM | POA: Diagnosis not present

## 2018-07-10 LAB — NOVEL CORONAVIRUS, NAA (HOSP ORDER, SEND-OUT TO REF LAB; TAT 18-24 HRS): SARS-CoV-2, NAA: NOT DETECTED

## 2018-07-12 ENCOUNTER — Other Ambulatory Visit: Payer: Self-pay

## 2018-07-12 ENCOUNTER — Ambulatory Visit: Admission: RE | Admit: 2018-07-12 | Payer: Medicare Other | Source: Ambulatory Visit

## 2018-07-13 ENCOUNTER — Ambulatory Visit
Admission: RE | Admit: 2018-07-13 | Discharge: 2018-07-13 | Disposition: A | Discharge status: Home / Self Care | Payer: Medicare Other | Source: Ambulatory Visit | Attending: Adult Health | Admitting: Adult Health

## 2018-07-13 DIAGNOSIS — R042 Hemoptysis: Secondary | ICD-10-CM | POA: Insufficient documentation

## 2018-07-13 DIAGNOSIS — J439 Emphysema, unspecified: Secondary | ICD-10-CM | POA: Diagnosis not present

## 2018-07-13 LAB — POCT I-STAT CREATININE: Creatinine, Ser: 0.8 mg/dL (ref 0.44–1.00)

## 2018-07-13 MED ORDER — IOHEXOL 300 MG/ML  SOLN
75.0000 mL | Freq: Once | INTRAMUSCULAR | Status: AC | PRN
  Administered 2018-07-13: 09:00:00 75 mL via INTRAVENOUS

## 2018-07-14 ENCOUNTER — Other Ambulatory Visit: Payer: Self-pay

## 2018-07-14 ENCOUNTER — Ambulatory Visit (INDEPENDENT_AMBULATORY_CARE_PROVIDER_SITE_OTHER): Payer: Medicare Other | Admitting: Pulmonary Disease

## 2018-07-14 ENCOUNTER — Encounter: Payer: Self-pay | Admitting: Adult Health

## 2018-07-14 ENCOUNTER — Ambulatory Visit (INDEPENDENT_AMBULATORY_CARE_PROVIDER_SITE_OTHER): Payer: Medicare Other | Admitting: Adult Health

## 2018-07-14 VITALS — BP 128/72 | HR 88 | Temp 97.9°F | Ht 63.0 in | Wt 134.2 lb

## 2018-07-14 DIAGNOSIS — J189 Pneumonia, unspecified organism: Secondary | ICD-10-CM

## 2018-07-14 DIAGNOSIS — I10 Essential (primary) hypertension: Secondary | ICD-10-CM

## 2018-07-14 DIAGNOSIS — E782 Mixed hyperlipidemia: Secondary | ICD-10-CM

## 2018-07-14 DIAGNOSIS — F17200 Nicotine dependence, unspecified, uncomplicated: Secondary | ICD-10-CM

## 2018-07-14 DIAGNOSIS — J441 Chronic obstructive pulmonary disease with (acute) exacerbation: Secondary | ICD-10-CM

## 2018-07-14 DIAGNOSIS — R042 Hemoptysis: Secondary | ICD-10-CM | POA: Diagnosis not present

## 2018-07-14 LAB — PULMONARY FUNCTION TEST
DL/VA % pred: 74 %
DL/VA: 3.23 ml/min/mmHg/L
DLCO unc % pred: 70 %
DLCO unc: 14.23 ml/min/mmHg
FEF 25-75 Post: 1.57 L/sec
FEF 25-75 Pre: 1.12 L/sec
FEF2575-%Change-Post: 40 %
FEF2575-%Pred-Post: 59 %
FEF2575-%Pred-Pre: 42 %
FEV1-%Change-Post: 10 %
FEV1-%Pred-Post: 86 %
FEV1-%Pred-Pre: 77 %
FEV1-Post: 2.3 L
FEV1-Pre: 2.08 L
FEV1FVC-%Change-Post: 7 %
FEV1FVC-%Pred-Pre: 82 %
FEV6-%Change-Post: 3 %
FEV6-%Pred-Post: 98 %
FEV6-%Pred-Pre: 94 %
FEV6-Post: 3.23 L
FEV6-Pre: 3.1 L
FEV6FVC-%Change-Post: 0 %
FEV6FVC-%Pred-Post: 101 %
FEV6FVC-%Pred-Pre: 100 %
FVC-%Change-Post: 3 %
FVC-%Pred-Post: 97 %
FVC-%Pred-Pre: 94 %
FVC-Post: 3.28 L
FVC-Pre: 3.18 L
Post FEV1/FVC ratio: 70 %
Post FEV6/FVC ratio: 98 %
Pre FEV1/FVC ratio: 65 %
Pre FEV6/FVC Ratio: 98 %

## 2018-07-14 NOTE — Assessment & Plan Note (Signed)
Recent exacerbation now resolved  COPD is stable with mild obstruction. Smoking cessation is key  Declines maintenance inhalers , will need to continue on nebs  Plan  Patient Instructions  Continue on Budesonide Neb Twice daily  .  Continue on Albuterol and Ipratropium Nebs Four times a day  .  Work on not smoking . This is your #1 goal .  Activity as tolerated.  Refer to cardiology for cardiac risk factors.  Discuss with Primary Provider regarding CT results with abdominal cyst .  Will discuss with Dr. Halford Chessman  Regarding Bronchoscopy .  Follow up with Dr. Halford Chessman  [place] in 2 months and As needed   Please contact office for sooner follow up if symptoms do not improve or worsen or seek emergency care

## 2018-07-14 NOTE — Progress Notes (Signed)
@Patient  ID: Kimberly Kemp, female    DOB: [birth year], 53 y.o.   MRN: [id number]  Chief Complaint  Patient presents with  . Follow-up    COPD     Referring provider: Martin Majestic, *  HPI: 53 year old female active smoker followed for COPD and emphysema  TEST/EVENTS :  Echo 04/15/06>>EF 55 to 65%  Pulmonary tests A1AT 09/04/06 >> 236 Spirometry 10/26/09>>FEV1 2.59(88%), FEV1% 73 RAST 01/22/10 >> negative, IgE 11.9 Labs 01/22/10 >> ACE 46, RF < 20, ANA negative, ANCA negative CT chest 06/26/10 >> upper lobe emphysema CT chest 06/10/13 >> minimal centrilobular emphysema  Sleep tests Home sleep study 09/13/10>>AHI 1, SpO2 low 91%  07/14/2018 Follow up : COPD  Patient presents for a one-month follow-up.  Patient had had a COPD exacerbation and pneumonia earlier this year.  She was treated with 7-day course of Levaquin.  Patient had significant improvement in symptoms with decreased cough and congestion.  However she has been having ongoing intermittent blood-tinged mucus is been going on for about 6 months.  Despite feeling better she continues to have intermittent cough and congestion and does on occasion see blood mixed in it either dark burgundy blood or bright red blood occasionally.  She remains on budesonide nebulizer twice daily and DuoNeb 4 times daily.  She has been tried on Symbicort in the past.  And declines inhaler says that they do not work.  Patient had PFTs done today that showed mild airflow obstruction , FEV1 86% , ratio 70, + BD response, Midflow obstruction/reversibility. DLCO 70% .  CT chest done 6/9 showed emphysema , no adenopathy, no acute process.  Smokes 1/2 PPD  Says overall doing okay , at baseline get winded with heavy activity .    Allergies  Allergen Reactions  . Penicillins Hives    Has patient had a PCN reaction causing immediate rash, facial/tongue/throat swelling, SOB or lightheadedness with hypotension: Yes Has patient had a PCN reaction  causing severe rash involving mucus membranes or skin necrosis: No Has patient had a PCN reaction that required hospitalization No Has patient had a PCN reaction occurring within the last 10 years: No If all of the above answers are "NO", then may proceed with Cephalosporin use.   . Omnipaque [Iohexol] Itching    Pt on 03/2018 when having a CT Abd/Pelvis develped itching after the CT and needs premedication for future CT scans. SPM    Immunization History  Administered Date(s) Administered  . Influenza Split 11/05/2011  . Influenza,inj,Quad PF,6+ Mos 11/03/2012  . Pneumococcal Polysaccharide-23 11/04/2003  . Tdap 01/18/2018    Past Medical History:  Diagnosis Date  . Anxiety   . Asthma   . Cancer ([place])    colon ca   . Chronic pain   . Cirrhosis ([place])   . COPD (chronic obstructive pulmonary disease) ([place])   . Depression   . Emphysema   . GERD (gastroesophageal reflux disease)   . Hepatitis B   . Hypertension   . Paroxysmal atrial fibrillation ([facility])   . Pulmonary embolism ([place])   . Stroke ([place])   . Tobacco abuse     Tobacco History: Social History   Tobacco Use  Smoking Status Current Every Day Smoker  . Packs/day: 0.25  . Years: 43.00  . Pack years: 10.75  . Types: Cigarettes  Smokeless Tobacco Never Used  Tobacco Comment   cutting back   Ready to quit: No Counseling given: Yes Comment: cutting back   Outpatient Medications  Prior to Visit  Medication Sig Dispense Refill  . albuterol (PROAIR HFA) 108 (90 Base) MCG/ACT inhaler INHALE 2 PUFFS INTO THE LUNGS EVERY FOUR HOURS AS NEEDED FOR WHEEZING (Patient taking differently: Inhale 2 puffs into the lungs every 4 (four) hours as needed for wheezing or shortness of breath. ) 8.5 Inhaler 5  . albuterol (PROVENTIL) (2.5 MG/3ML) 0.083% nebulizer solution TAKE 3 MLS (2.5 MG TOTAL) BY NEBULIZATION 4 TIMES DAILY AS NEEDED FOR WHEEZING OR SHORTNESS OF BREATH 375 mL 3  . aspirin EC 81 MG tablet Take 81 mg by mouth every  morning.    Marland Kitchen atorvastatin (LIPITOR) 80 MG tablet Take 80 mg by mouth at bedtime.  5  . benzonatate (TESSALON) 200 MG capsule Take 1 capsule (200 mg total) by mouth 3 (three) times daily as needed for cough. 30 capsule 3  . budesonide (PULMICORT) 0.25 MG/2ML nebulizer solution TAKE 2 MLS (0.25 MG TOTAL) BY NEBULIZATION 2 (TWO) TIMES DAILY. 120 mL 3  . butalbital-acetaminophen-caffeine (FIORICET, ESGIC) 50-325-40 MG tablet Take 1 tablet by mouth 3 (three) times daily as needed for headache.  2  . dextromethorphan-guaiFENesin (MUCINEX DM) 30-600 MG per 12 hr tablet Take 1 tablet by mouth every 12 (twelve) hours.      . diphenhydrAMINE (BENADRYL) 50 MG tablet Take 1 hour prior to CT for premedication. 1 tablet 0  . DULoxetine (CYMBALTA) 20 MG capsule TAKE ONE CAPSULE BY MOUTH EVERY DAY (Patient taking differently: Take 20 mg by mouth daily. ) 30 capsule 3  . gabapentin (NEURONTIN) 300 MG capsule Take 300 mg by mouth at bedtime.  1  . hydrOXYzine (VISTARIL) 50 MG capsule Take 50 mg by mouth 2 (two) times daily as needed for anxiety.  2  . ipratropium (ATROVENT) 0.02 % nebulizer solution TAKE 1 VIAL BY NEBULIZER EVERY 4 HOURS AS NEEDED 437.5 mL 3  . meloxicam (MOBIC) 15 MG tablet Take 1 tablet (15 mg total) by mouth daily. 30 tablet 0  . nystatin (MYCOSTATIN) 100000 UNIT/ML suspension Take 5 mLs (500,000 Units total) by mouth 4 (four) times daily. Swish and swallow 473 mL 0  . omeprazole (PRILOSEC) 40 MG capsule TAKE 1 CAPSULE BY MOUTH EVERY DAY (Patient taking differently: Take 40 mg by mouth daily. ) 30 capsule 0  . ondansetron (ZOFRAN) 4 MG tablet Take 4 mg by mouth 3 (three) times daily.  0  . PAZEO 0.7 % SOLN Place 1 drop into both eyes daily.    . Probiotic Product (PROBIOTIC-10 PO) Take 1 tablet by mouth daily.    . promethazine (PHENERGAN) 25 MG tablet Take 25 mg by mouth 4 (four) times daily.  2  . promethazine-codeine (PHENERGAN WITH CODEINE) 6.25-10 MG/5ML syrup Take 5 mLs by mouth every 6  (six) hours as needed for cough. 120 mL 0  . predniSONE (DELTASONE) 50 MG tablet Take 1tab 13 hours prior to CT, then 1 tab 7 hours prior to CT, then 1 tab 1 hour prior to CT for premedication. 3 tablet 0   No facility-administered medications prior to visit.      Review of Systems:   Constitutional:   No  weight loss, night sweats,  Fevers, chills,  +fatigue, or  lassitude.  HEENT:   No headaches,  Difficulty swallowing,  Tooth/dental problems, or  Sore throat,                No sneezing, itching, ear ache, nasal congestion, post nasal drip,   CV:  No chest pain,  Orthopnea, PND, swelling in lower extremities, anasarca, dizziness, palpitations, syncope.   GI  No heartburn, indigestion, abdominal pain, nausea, vomiting, diarrhea, change in bowel habits, loss of appetite, bloody stools.   Resp: No coughing up of blood.  No change in color of mucus.  No wheezing.  No chest wall deformity  Skin: no rash or lesions.  GU: no dysuria, change in color of urine, no urgency or frequency.  No flank pain, no hematuria   MS:  No joint pain or swelling.  No decreased range of motion.  No back pain.    Physical Exam  BP 128/72 (BP Location: Left Arm, Patient Position: Sitting, Cuff Size: Normal)   Pulse 88   Temp 97.9 F (36.6 C) (Oral)   Ht 5\' 3"  (1.6 m)   Wt 134 lb 3.2 oz (60.9 kg)   LMP 07/18/2016   SpO2 99%   BMI 23.77 kg/m   GEN: A/Ox3; pleasant , NAD, well nourished    HEENT:  [place]/AT,  EACs-clear, TMs-wnl, NOSE-clear, THROAT-clear, no lesions, no postnasal drip or exudate noted.   NECK:  Supple w/ fair ROM; no JVD; normal carotid impulses w/o bruits; no thyromegaly or nodules palpated; no lymphadenopathy.    RESP  Clear  P & A; w/o, wheezes/ rales/ or rhonchi. no accessory muscle use, no dullness to percussion  CARD:  RRR, no m/r/g, tr  peripheral edema, pulses intact, no cyanosis or clubbing.  GI:   Soft & nt; nml bowel sounds; no organomegaly or masses detected.    Musco: Warm bil, no deformities or joint swelling noted.   Neuro: alert, no focal deficits noted.    Skin: Warm, no lesions or rashes    Lab Results:  CBC    Component Value Date/Time   WBC 8.2 05/06/2018 1734   RBC 4.31 05/06/2018 1734   HGB 13.4 05/06/2018 1734   HCT 38.4 05/06/2018 1734   PLT 250 05/06/2018 1734   MCV 89.1 05/06/2018 1734   MCH 31.1 05/06/2018 1734   MCHC 34.9 05/06/2018 1734   RDW 12.9 05/06/2018 1734   LYMPHSABS 2.9 08/08/2016 1258   MONOABS 0.9 08/08/2016 1258   EOSABS 0.4 08/08/2016 1258   BASOSABS 0.1 08/08/2016 1258    BMET    Component Value Date/Time   NA 137 05/06/2018 1734   K 4.0 05/06/2018 1734   CL 105 05/06/2018 1734   CO2 21 (L) 05/06/2018 1734   GLUCOSE 104 (H) 05/06/2018 1734   BUN 17 05/06/2018 1734   CREATININE 0.80 07/13/2018 0923   CALCIUM 9.7 05/06/2018 1734   GFRNONAA >60 05/06/2018 1734   GFRAA >60 05/06/2018 1734    BNP No results found for: BNP  ProBNP    Component Value Date/Time   PROBNP 58.2 01/14/2010 1052    Imaging: Ct Chest W Contrast  Result Date: 07/13/2018 CLINICAL DATA:  Hemoptysis and chest pain for months. No recent trauma. Remote history of colon cancer. Long smoking history. EXAM: CT CHEST WITH CONTRAST TECHNIQUE: Multidetector CT imaging of the chest was performed during intravenous contrast administration. CONTRAST:  71mL OMNIPAQUE IOHEXOL 300 MG/ML  SOLN COMPARISON:  None. FINDINGS: Cardiovascular: The heart is normal in size. No pericardial effusion. There is mild tortuosity and scattered calcification involving the thoracic aorta and branch vessel ostia. Suspect scattered coronary artery calcifications. Mediastinum/Nodes: No mediastinal or hilar mass or lymphadenopathy. Scattered lymph nodes are noted. The esophagus is grossly normal. Thyroid gland is normal. Lungs/Pleura: Moderate to advanced emphysematous changes for age. No  acute pulmonary findings or worrisome pulmonary lesions. No pulmonary  nodules. No interstitial lung disease or bronchiectasis. No obvious endobronchial lesions. Upper Abdomen: No significant upper abdominal findings. Again demonstrated is a benign-appearing cystic lesion measuring 17 mm between the left adrenal gland and the gastric fundus. This could be a benign peritoneal closures cyst or possibly a duplication cyst associated with the stomach. No worrisome CT features and no change since February. Age advanced calcifications involving the upper abdominal aorta. Musculoskeletal: No significant bony findings. IMPRESSION: 1. Emphysematous changes but no acute pulmonary findings or worrisome pulmonary lesions. 2. No mediastinal or hilar mass or adenopathy. 3. Age advanced vascular calcifications. 4. Stable 17 mm cystic lesion in the left upper quadrant as detailed above. Aortic Atherosclerosis (ICD10-I70.0) and Emphysema (ICD10-J43.9). Electronically Signed   By: Marijo Sanes M.D.   On: 07/13/2018 10:29      PFT Results Latest Ref Rng & Units 07/14/2018  FVC-Pre L 3.18  FVC-Predicted Pre % 94  FVC-Post L 3.28  FVC-Predicted Post % 97  Pre FEV1/FVC % % 65  Post FEV1/FCV % % 70  FEV1-Pre L 2.08  FEV1-Predicted Pre % 77  FEV1-Post L 2.30  DLCO UNC% % 70  DLCO COR %Predicted % 74    No results found for: NITRICOXIDE      Assessment & Plan:   COPD exacerbation (HCC) Recent exacerbation now resolved  COPD is stable with mild obstruction. Smoking cessation is key  Declines maintenance inhalers , will need to continue on nebs  Plan  Patient Instructions  Continue on Budesonide Neb Twice daily  .  Continue on Albuterol and Ipratropium Nebs Four times a day  .  Work on not smoking . This is your #1 goal .  Activity as tolerated.  Refer to cardiology for cardiac risk factors.  Discuss with Primary Provider regarding CT results with abdominal cyst .  Will discuss with Dr. Halford Chessman  Regarding Bronchoscopy .  Follow up with Dr. Halford Chessman  Or Iszabella Hebenstreit in 2 months and As  needed   Please contact office for sooner follow up if symptoms do not improve or worsen or seek emergency care         Hemoptysis Chronic blood tinged mucus >6 months  CT chest w/ contrast unrevealing , may  need to consider bronchoscopy to further evaluate On ASA , not on anticoagulants ,  Cbc ok in April  Atherosclerosis on CT -has card risk factors will need cards referral   Plan  Patient Instructions  Continue on Budesonide Neb Twice daily  .  Continue on Albuterol and Ipratropium Nebs Four times a day  .  Work on not smoking . This is your #1 goal .  Activity as tolerated.  Refer to cardiology for cardiac risk factors.  Discuss with Primary Provider regarding CT results with abdominal cyst .  Will discuss with Dr. Halford Chessman  Regarding Bronchoscopy .  Follow up with Dr. Halford Chessman  Or Keyron Pokorski in 2 months and As needed   Please contact office for sooner follow up if symptoms do not improve or worsen or seek emergency care            Rexene Edison, NP 07/14/2018

## 2018-07-14 NOTE — Progress Notes (Signed)
PFT done today. 

## 2018-07-14 NOTE — Assessment & Plan Note (Addendum)
Chronic blood tinged mucus >6 months  CT chest w/ contrast unrevealing , may  need to consider bronchoscopy to further evaluate On ASA , not on anticoagulants ,  Cbc ok in April  Atherosclerosis on CT -has card risk factors will need cards referral   Plan  Patient Instructions  Continue on Budesonide Neb Twice daily  .  Continue on Albuterol and Ipratropium Nebs Four times a day  .  Work on not smoking . This is your #1 goal .  Activity as tolerated.  Refer to cardiology for cardiac risk factors.  Discuss with Primary Provider regarding CT results with abdominal cyst .  Will discuss with Dr. Halford Chessman  Regarding Bronchoscopy .  Follow up with Dr. Halford Chessman  Or Ashad Fawbush in 2 months and As needed   Please contact office for sooner follow up if symptoms do not improve or worsen or seek emergency care

## 2018-07-14 NOTE — Patient Instructions (Addendum)
Continue on Budesonide Neb Twice daily  .  Continue on Albuterol and Ipratropium Nebs Four times a day  .  Work on not smoking . This is your #1 goal .  Activity as tolerated.  Refer to cardiology for cardiac risk factors.  Discuss with Primary Provider regarding CT results with abdominal cyst .  Will discuss with Dr. Halford Chessman  Regarding Bronchoscopy .  Follow up with Dr. Halford Chessman  Or Ashby Moskal in 2 months and As needed   Please contact office for sooner follow up if symptoms do not improve or worsen or seek emergency care

## 2018-08-18 DIAGNOSIS — K219 Gastro-esophageal reflux disease without esophagitis: Secondary | ICD-10-CM | POA: Diagnosis not present

## 2018-08-18 DIAGNOSIS — E559 Vitamin D deficiency, unspecified: Secondary | ICD-10-CM | POA: Diagnosis not present

## 2018-08-18 DIAGNOSIS — Z1389 Encounter for screening for other disorder: Secondary | ICD-10-CM | POA: Diagnosis not present

## 2018-08-18 DIAGNOSIS — R1011 Right upper quadrant pain: Secondary | ICD-10-CM | POA: Diagnosis not present

## 2018-08-18 DIAGNOSIS — R1013 Epigastric pain: Secondary | ICD-10-CM | POA: Diagnosis not present

## 2018-08-18 DIAGNOSIS — E7800 Pure hypercholesterolemia, unspecified: Secondary | ICD-10-CM | POA: Diagnosis not present

## 2018-08-19 DIAGNOSIS — Z03818 Encounter for observation for suspected exposure to other biological agents ruled out: Secondary | ICD-10-CM | POA: Diagnosis not present

## 2018-08-23 DIAGNOSIS — K219 Gastro-esophageal reflux disease without esophagitis: Secondary | ICD-10-CM | POA: Diagnosis not present

## 2018-08-23 DIAGNOSIS — R1310 Dysphagia, unspecified: Secondary | ICD-10-CM | POA: Diagnosis not present

## 2018-08-23 DIAGNOSIS — K921 Melena: Secondary | ICD-10-CM | POA: Diagnosis not present

## 2018-08-23 DIAGNOSIS — Z86010 Personal history of colonic polyps: Secondary | ICD-10-CM | POA: Diagnosis not present

## 2018-08-23 DIAGNOSIS — R1013 Epigastric pain: Secondary | ICD-10-CM | POA: Diagnosis not present

## 2018-09-03 NOTE — Progress Notes (Signed)
Reviewed and agree with assessment/plan.   Kimberly Whipple, MD [facility] 01/30/2016, 12:24 PM Pager:  [phone]  

## 2018-09-03 NOTE — Progress Notes (Signed)
Reviewed and agree with assessment/plan.   Owen Pratte, MD [facility] 01/30/2016, 12:24 PM Pager:  [phone]  

## 2018-09-10 ENCOUNTER — Encounter: Payer: Self-pay | Admitting: Emergency Medicine

## 2018-09-10 ENCOUNTER — Emergency Department
Admission: EM | Admit: 2018-09-10 | Discharge: 2018-09-10 | Disposition: A | Discharge status: Home / Self Care | Payer: Medicare Other | Attending: Emergency Medicine | Admitting: Emergency Medicine

## 2018-09-10 ENCOUNTER — Other Ambulatory Visit: Payer: Self-pay

## 2018-09-10 DIAGNOSIS — J449 Chronic obstructive pulmonary disease, unspecified: Secondary | ICD-10-CM | POA: Diagnosis not present

## 2018-09-10 DIAGNOSIS — Z79899 Other long term (current) drug therapy: Secondary | ICD-10-CM | POA: Diagnosis not present

## 2018-09-10 DIAGNOSIS — F17210 Nicotine dependence, cigarettes, uncomplicated: Secondary | ICD-10-CM | POA: Diagnosis not present

## 2018-09-10 DIAGNOSIS — Z8673 Personal history of transient ischemic attack (TIA), and cerebral infarction without residual deficits: Secondary | ICD-10-CM | POA: Diagnosis not present

## 2018-09-10 DIAGNOSIS — J029 Acute pharyngitis, unspecified: Secondary | ICD-10-CM | POA: Diagnosis not present

## 2018-09-10 DIAGNOSIS — Z85038 Personal history of other malignant neoplasm of large intestine: Secondary | ICD-10-CM | POA: Diagnosis not present

## 2018-09-10 DIAGNOSIS — Z7982 Long term (current) use of aspirin: Secondary | ICD-10-CM | POA: Diagnosis not present

## 2018-09-10 DIAGNOSIS — I10 Essential (primary) hypertension: Secondary | ICD-10-CM | POA: Diagnosis not present

## 2018-09-10 DIAGNOSIS — Z20828 Contact with and (suspected) exposure to other viral communicable diseases: Secondary | ICD-10-CM | POA: Diagnosis not present

## 2018-09-10 LAB — GROUP A STREP BY PCR: Group A Strep by PCR: NOT DETECTED

## 2018-09-10 LAB — SARS CORONAVIRUS 2 BY RT PCR (HOSPITAL ORDER, PERFORMED IN ~~LOC~~ HOSPITAL LAB): SARS Coronavirus 2: NEGATIVE

## 2018-09-10 MED ORDER — MAGIC MOUTHWASH W/LIDOCAINE: 10.0000 mL | Freq: Four times a day (QID) | ORAL | 0 refills | Status: AC | PRN

## 2018-09-10 NOTE — ED Triage Notes (Signed)
Pt presents to ED c/o sore throat since Tuesday. Pt states "it looks like there's blisters in my throat." Also c/o headache.

## 2018-09-10 NOTE — ED Provider Notes (Signed)
[facility] Emergency Department Provider Note  ____________________________________________  Time seen: Approximately 6:48 PM  I have reviewed the triage vital signs and the nursing notes.   HISTORY  Chief Complaint Sore Throat    HPI Kimberly Kemp is a 53 y.o. female who presents to the emergency department for treatment and evaluation of sore throat that is been present for the past 3 days.  She states it looks and feels like there are blisters in her throat.  She is also having a headache.  No known exposure to strep or COVID-19. No relief with cough drops.   Past Medical History:  Diagnosis Date  . Anxiety   . Asthma   . Cancer ([place])    colon ca   . Chronic pain   . Cirrhosis ([place])   . COPD (chronic obstructive pulmonary disease) ([place])   . Depression   . Emphysema   . GERD (gastroesophageal reflux disease)   . Hepatitis B   . Hypertension   . Paroxysmal atrial fibrillation ([facility])   . Pulmonary embolism ([place])   . Stroke ([place])   . Tobacco abuse     Patient Active Problem List   Diagnosis Date Noted  . Right lower lobe pneumonia ([place]) 06/11/2018  . Generalized anxiety disorder 10/10/2015  . COPD exacerbation ([place]) 09/22/2015  . Essential hypertension 09/22/2015  . History of stroke 09/22/2015  . Leukocytosis 09/22/2015  . Acute respiratory failure ([place])   . Mixed hyperlipidemia   . Acute exacerbation of chronic obstructive pulmonary disease (COPD) ([place]) 09/04/2015  . Acute bronchitis 04/04/2015  . Hemoptysis 06/30/2014  . Thrush 07/22/2013  . Chest pain 04/27/2013  . GERD (gastroesophageal reflux disease) 06/18/2012  . Chronic cough 06/18/2012  . EMPHYSEMA 05/27/2007  . Tobacco abuse 12/11/2006    Past Surgical History:  Procedure Laterality Date  . APPENDECTOMY     microscopic   . BRONCHOSCOPY  March 2008, Feb 2012  . CESAREAN SECTION      Prior to Admission medications   Medication Sig Start Date End Date Taking? Authorizing  Provider  albuterol (PROAIR HFA) 108 (90 Base) MCG/ACT inhaler INHALE 2 PUFFS INTO THE LUNGS EVERY FOUR HOURS AS NEEDED FOR WHEEZING Patient taking differently: Inhale 2 puffs into the lungs every 4 (four) hours as needed for wheezing or shortness of breath.  08/07/17   Chesley Mires, MD  albuterol (PROVENTIL) (2.5 MG/3ML) 0.083% nebulizer solution TAKE 3 MLS (2.5 MG TOTAL) BY NEBULIZATION 4 TIMES DAILY AS NEEDED FOR WHEEZING OR SHORTNESS OF BREATH 07/06/18   Chesley Mires, MD  aspirin EC 81 MG tablet Take 81 mg by mouth every morning.    [provider]  atorvastatin (LIPITOR) 80 MG tablet Take 80 mg by mouth at bedtime. 10/17/17   [provider]  benzonatate (TESSALON) 200 MG capsule Take 1 capsule (200 mg total) by mouth 3 (three) times daily as needed for cough. 05/12/18 05/12/19  Parrett, Tammy S, NP  budesonide (PULMICORT) 0.25 MG/2ML nebulizer solution TAKE 2 MLS (0.25 MG TOTAL) BY NEBULIZATION 2 (TWO) TIMES DAILY. 06/23/18   Chesley Mires, MD  butalbital-acetaminophen-caffeine (FIORICET, ESGIC) [phone] MG tablet Take 1 tablet by mouth 3 (three) times daily as needed for headache. 11/19/17   [provider]  dextromethorphan-guaiFENesin (MUCINEX DM) 30-600 MG per 12 hr tablet Take 1 tablet by mouth every 12 (twelve) hours.      [provider]  diphenhydrAMINE (BENADRYL) 50 MG tablet Take 1 hour prior to CT for premedication.  06/18/18   Fenton Foy, NP  DULoxetine (CYMBALTA) 20 MG capsule TAKE ONE CAPSULE BY MOUTH EVERY DAY Patient taking differently: Take 20 mg by mouth daily.  03/03/16   Lorella Nimrod, MD  gabapentin (NEURONTIN) 300 MG capsule Take 300 mg by mouth at bedtime. 11/14/17   [provider]  hydrOXYzine (VISTARIL) 50 MG capsule Take 50 mg by mouth 2 (two) times daily as needed for anxiety. 11/11/17   [provider]  ipratropium (ATROVENT) 0.02 % nebulizer solution TAKE 1 VIAL BY NEBULIZER EVERY 4 HOURS AS NEEDED 06/29/18   Chesley Mires, MD  magic mouthwash w/lidocaine SOLN Take 10 mLs by mouth 4 (four) times daily as needed for mouth pain. 09/10/18   Jameka Ivie, Johnette Abraham B, FNP  meloxicam (MOBIC) 15 MG tablet Take 1 tablet (15 mg total) by mouth daily. 01/18/18   Cuthriell, Charline Bills, PA-C  nystatin (MYCOSTATIN) 100000 UNIT/ML suspension Take 5 mLs (500,000 Units total) by mouth 4 (four) times daily. Swish and swallow 12/09/17   Virgel Manifold, MD  omeprazole (PRILOSEC) 40 MG capsule TAKE 1 CAPSULE BY MOUTH EVERY DAY Patient taking differently: Take 40 mg by mouth daily.  06/02/17   Chesley Mires, MD  ondansetron (ZOFRAN) 4 MG tablet Take 4 mg by mouth 3 (three) times daily. 10/14/17   [provider]  PAZEO 0.7 % SOLN Place 1 drop into both eyes daily. 11/26/17   [provider]  Probiotic Product (PROBIOTIC-10 PO) Take 1 tablet by mouth daily.    [provider]  promethazine (PHENERGAN) 25 MG tablet Take 25 mg by mouth 4 (four) times daily. 11/09/17   [provider]  promethazine-codeine (PHENERGAN WITH CODEINE) 6.25-10 MG/5ML syrup Take 5 mLs by mouth every 6 (six) hours as needed for cough. 07/08/18   Parrett, Fonnie Mu, NP    Allergies Penicillins and Omnipaque [iohexol]  Family History  Problem Relation Age of Onset  . Emphysema Father   . Prostate cancer Father   . Liver disease Father   . Emphysema Maternal Grandmother   . Diabetes Mother   . Heart disease Mother   . Colon cancer Neg Hx     Social History Social History   Tobacco Use  . Smoking status: Current Every Day Smoker    Packs/day: 0.25    Years: 43.00    Pack years: 10.75    Types: Cigarettes  . Smokeless tobacco: Never Used  . Tobacco comment: cutting back  Substance Use Topics  . Alcohol use: No    Alcohol/week: 0.0 standard drinks  . Drug use: No    Review of Systems Constitutional: Negative for fever. Eyes: No visual changes. ENT: Positive for sore throat; negative for difficulty  swallowing. Respiratory: Denies shortness of breath. Gastrointestinal: Negative for abdominal pain.  No nausea, no vomiting.  No diarrhea.  Genitourinary: Negative for dysuria.  Negative for decrease in need to void. Musculoskeletal: Negative for generalized body aches. Skin: Negative for rash. Neurological: Positive for headaches, negative for focal weakness or numbness.  ____________________________________________   PHYSICAL EXAM:  VITAL SIGNS: ED Triage Vitals  Enc Vitals Group     BP 09/10/18 1544 127/79     Pulse Rate 09/10/18 1544 (!) 101     Resp 09/10/18 1544 18     Temp 09/10/18 1544 99 F (37.2 C)     Temp Source 09/10/18 1544 Oral     SpO2 09/10/18 1544 95 %     Weight 09/10/18 1544 124 lb (56.2  kg)     Height 09/10/18 1544 5\' 3"  (1.6 m)     Head Circumference --      Peak Flow --      Pain Score 09/10/18 1546 8     Pain Loc --      Pain Edu? --      Excl. in [place]? --     Constitutional: Alert and oriented. Well appearing and in no acute distress. Eyes: Conjunctivae are normal.  Head: Atraumatic. Nose: No congestion/rhinnorhea. Mouth/Throat: Mucous membranes are moist.  Oropharynx erythematous, tonsils not visualized without exudate. Uvula is midline. Ears: Right tympanic membrane appears normal.  Left tympanic membrane appears normal. Neck: No stridor. Voice clear Lymphatic: Anterior cervical nodes nontender Cardiovascular: Normal rate, regular rhythm. Good peripheral circulation. Respiratory: Normal respiratory effort. Lungs CTAB. Gastrointestinal: Soft and nontender. Musculoskeletal: FROM of neck, upper and lower extremities. Neurologic:  Normal speech and language. No gross focal neurologic deficits are appreciated. Skin:  Skin is warm, dry and intact.  No rash noted Psychiatric: Mood and affect are normal. Speech and behavior are normal.  ____________________________________________   LABS (all labs ordered are listed, but only abnormal results are  displayed)  Labs Reviewed  GROUP A STREP BY PCR  SARS CORONAVIRUS 2 (HOSPITAL ORDER, [facility])   ____________________________________________  EKG  Not indiated ____________________________________________  RADIOLOGY  Not indicated ____________________________________________   PROCEDURES  Procedure(s) performed: None  Critical Care performed: No ____________________________________________   INITIAL IMPRESSION / ASSESSMENT AND PLAN / ED COURSE  53 year old female presenting to the emergency department for treatment and evaluation of headache and sore throat.  Strep screen is negative.  COVID-19 is unlikely but send out testing completed.  This is most likely related to postnasal drip or viral pharyngitis.  She will be treated with the Magic mouthwash that contains lidocaine.  She was advised to take Tylenol or ibuprofen to help with her headache and sore throat.  She was advised to follow-up with her primary care provider for symptoms that are not improving over the next few days.  She was encouraged to return to the emergency department for symptoms change or worsen if she is unable to schedule appointment.  Kimberly Kemp was evaluated in Emergency Department on 09/10/2018 for the symptoms described in the history of present illness. She was evaluated in the context of the global COVID-19 pandemic, which necessitated consideration that the patient might be at risk for infection with the SARS-CoV-2 virus that causes COVID-19. Institutional protocols and algorithms that pertain to the evaluation of patients at risk for COVID-19 are in a state of rapid change based on information released by regulatory bodies including the CDC and federal and state organizations. These policies and algorithms were followed during the patient's care in the ED.   Pertinent labs & imaging results that were available during my care of the patient were reviewed by me and  considered in my medical decision making (see chart for details). ____________________________________________  Discharge Medication List as of 09/10/2018  5:27 PM    START taking these medications   Details  magic mouthwash w/lidocaine SOLN Take 10 mLs by mouth 4 (four) times daily as needed for mouth pain., Starting Fri 09/10/2018, Print        FINAL CLINICAL IMPRESSION(S) / ED DIAGNOSES  Final diagnoses:  Pharyngitis, unspecified etiology    If controlled substance prescribed during this visit, 12 month history viewed on the [place] prior to issuing an initial prescription for  Schedule II or III opiod.   Note:  This document was prepared using Dragon voice recognition software and may include unintentional dictation errors.   Victorino Dike, FNP 09/10/18 1950    Schuyler Amor, MD 09/10/18 [phone]

## 2018-09-14 NOTE — Progress Notes (Signed)
Reviewed and agree with assessment/plan.   Kelaiah Escalona, MD [facility] 01/30/2016, 12:24 PM Pager:  [phone]  

## 2018-09-15 DIAGNOSIS — J449 Chronic obstructive pulmonary disease, unspecified: Secondary | ICD-10-CM | POA: Diagnosis not present

## 2018-09-15 DIAGNOSIS — J029 Acute pharyngitis, unspecified: Secondary | ICD-10-CM | POA: Diagnosis not present

## 2018-09-15 DIAGNOSIS — J40 Bronchitis, not specified as acute or chronic: Secondary | ICD-10-CM | POA: Diagnosis not present

## 2018-09-15 DIAGNOSIS — J441 Chronic obstructive pulmonary disease with (acute) exacerbation: Secondary | ICD-10-CM | POA: Diagnosis not present

## 2018-09-16 ENCOUNTER — Encounter: Payer: Self-pay | Admitting: Internal Medicine

## 2018-09-16 ENCOUNTER — Other Ambulatory Visit: Payer: Self-pay

## 2018-09-16 ENCOUNTER — Ambulatory Visit (INDEPENDENT_AMBULATORY_CARE_PROVIDER_SITE_OTHER): Payer: Medicare Other | Admitting: Internal Medicine

## 2018-09-16 VITALS — BP 158/95 | HR 94 | Ht 63.0 in | Wt 132.2 lb

## 2018-09-16 DIAGNOSIS — R0602 Shortness of breath: Secondary | ICD-10-CM

## 2018-09-16 DIAGNOSIS — R002 Palpitations: Secondary | ICD-10-CM | POA: Diagnosis not present

## 2018-09-16 DIAGNOSIS — I10 Essential (primary) hypertension: Secondary | ICD-10-CM

## 2018-09-16 DIAGNOSIS — R079 Chest pain, unspecified: Secondary | ICD-10-CM | POA: Diagnosis not present

## 2018-09-16 DIAGNOSIS — E785 Hyperlipidemia, unspecified: Secondary | ICD-10-CM | POA: Diagnosis not present

## 2018-09-16 NOTE — Progress Notes (Signed)
New Outpatient Visit Date: 09/16/2018  Referring Provider: Melvenia Needles, NP [address] [place],  [place] [address]  Chief Complaint: Shortness of breath, chest pain, and palpitations  HPI:  Ms. Jenne is a 53 y.o. female who is being seen today for the evaluation of shortness of breath in the setting of COPD and multiple cardiac risk factors at the request of Parrett, Fonnie Mu, NP. She has a history of hypertension, hyperlipidemia, stroke, questionable paroxysmal atrial fibrillation, questionable pulmonary embolism, COPD, colon cancer, hepatitis B, anxiety, and tobacco use.  Ms. Jiron reports that she is a long history of COPD and shortness of breath but is able to walk up to 2 miles without having to stop.  She notes occasional chest pain that she describes as central tightness rating to the left arm with associated numbness and tingling.  It happens multiple days a week but is not clearly related to any activity.  It typically resolves spontaneously over the course of a few minutes.  She has some associated shortness of breath.  Ms. Granados reports "bad palpitations" that she has had since childhood.  She believes that she was diagnosed with atrial fibrillation at some point, though there are no EKGs or event monitors to confirm this.  Patient was also never advised to begin anticoagulation despite report of atrial fibrillation and pulmonary embolism (no CTA in our system never documented a PE).  Over the last few months, Ms. Maultsby was also noted occasional scant hemoptysis.  Work-up by pulmonary has been unrevealing thus far.  Ms. Pals is also undergoing GI work-up for colon cancer as well as a "knot" in her liver and neck.  Ms. Scarpati notes that her blood pressure has been intermittently elevated in the past.  However, she has been intolerant of antihypertensive medications due to hypotension per her report. Madeline declined  --------------------------------------------------------------------------------------------------  Cardiovascular History & Procedures: Cardiovascular Problems:  Paroxysmal atrial fibrillation  Pulmonary embolism  Shortness of breath  Risk Factors:  Prior stroke, hypertension, hyperlipidemia, and tobacco use  Cath/PCI:  None  CV Surgery:  None  EP Procedures and Devices:  None  Non-Invasive Evaluation(s):  None  Recent CV Pertinent Labs: Lab Results  Component Value Date   CHOL 305 (H) 09/22/2015   HDL 83 09/22/2015   LDLCALC 174 (H) 09/22/2015   TRIG 241 (H) 09/22/2015   CHOLHDL 3.7 09/22/2015   K 4.0 05/06/2018   BUN 17 05/06/2018   CREATININE 0.80 07/13/2018    --------------------------------------------------------------------------------------------------  Past Medical History:  Diagnosis Date  . Anxiety   . Asthma   . Cancer ([place])    colon ca   . Chronic pain   . Cirrhosis ([place])   . COPD (chronic obstructive pulmonary disease) ([place])   . Depression   . Emphysema   . GERD (gastroesophageal reflux disease)   . Heart murmur   . Hepatitis B   . Hyperlipidemia   . Hypertension   . Paroxysmal atrial fibrillation ([facility])   . Pulmonary embolism ([place])   . Stroke ([place])   . Tobacco abuse     Past Surgical History:  Procedure Laterality Date  . APPENDECTOMY     microscopic   . BRONCHOSCOPY  March 2008, Feb 2012  . CESAREAN SECTION      Current Meds  Medication Sig  . albuterol (PROAIR HFA) 108 (90 Base) MCG/ACT inhaler INHALE 2 PUFFS INTO THE LUNGS EVERY FOUR HOURS AS NEEDED FOR WHEEZING (Patient taking differently: Inhale 2  puffs into the lungs every 4 (four) hours as needed for wheezing or shortness of breath. )  . albuterol (PROVENTIL) (2.5 MG/3ML) 0.083% nebulizer solution TAKE 3 MLS (2.5 MG TOTAL) BY NEBULIZATION 4 TIMES DAILY AS NEEDED FOR WHEEZING OR SHORTNESS OF BREATH  . aspirin EC 81 MG tablet Take 81 mg by mouth every morning.  Marland Kitchen  atorvastatin (LIPITOR) 80 MG tablet Take 80 mg by mouth at bedtime.  . benzonatate (TESSALON) 200 MG capsule Take 1 capsule (200 mg total) by mouth 3 (three) times daily as needed for cough.  . budesonide (PULMICORT) 0.25 MG/2ML nebulizer solution TAKE 2 MLS (0.25 MG TOTAL) BY NEBULIZATION 2 (TWO) TIMES DAILY.  . butalbital-acetaminophen-caffeine (FIORICET, ESGIC) 50-325-40 MG tablet Take 1 tablet by mouth 3 (three) times daily as needed for headache.  . dextromethorphan-guaiFENesin (MUCINEX DM) 30-600 MG per 12 hr tablet Take 1 tablet by mouth every 12 (twelve) hours.    . diphenhydrAMINE (BENADRYL) 50 MG tablet Take 1 hour prior to CT for premedication.  . DULoxetine (CYMBALTA) 20 MG capsule TAKE ONE CAPSULE BY MOUTH EVERY DAY (Patient taking differently: Take 20 mg by mouth daily. )  . gabapentin (NEURONTIN) 300 MG capsule Take 300 mg by mouth at bedtime.  . hydrOXYzine (VISTARIL) 50 MG capsule Take 50 mg by mouth 2 (two) times daily as needed for anxiety.  Marland Kitchen ipratropium (ATROVENT) 0.02 % nebulizer solution TAKE 1 VIAL BY NEBULIZER EVERY 4 HOURS AS NEEDED  . magic mouthwash w/lidocaine SOLN Take 10 mLs by mouth 4 (four) times daily as needed for mouth pain.  . meloxicam (MOBIC) 15 MG tablet Take 1 tablet (15 mg total) by mouth daily.  Marland Kitchen nystatin (MYCOSTATIN) 100000 UNIT/ML suspension Take 5 mLs (500,000 Units total) by mouth 4 (four) times daily. Swish and swallow  . omeprazole (PRILOSEC) 40 MG capsule TAKE 1 CAPSULE BY MOUTH EVERY DAY (Patient taking differently: Take 40 mg by mouth daily. )  . ondansetron (ZOFRAN) 4 MG tablet Take 4 mg by mouth 3 (three) times daily.  Marland Kitchen PAZEO 0.7 % SOLN Place 1 drop into both eyes daily.  . Probiotic Product (PROBIOTIC-10 PO) Take 1 tablet by mouth daily.  . promethazine (PHENERGAN) 25 MG tablet Take 25 mg by mouth 4 (four) times daily.  . promethazine-codeine (PHENERGAN WITH CODEINE) 6.25-10 MG/5ML syrup Take 5 mLs by mouth every 6 (six) hours as needed for  cough.    Allergies: Penicillins, Red dye, and Omnipaque [iohexol]  Social History   Tobacco Use  . Smoking status: Current Every Day Smoker    Packs/day: 0.50    Years: 43.00    Pack years: 21.50    Types: Cigarettes  . Smokeless tobacco: Never Used  . Tobacco comment: cutting back  Substance Use Topics  . Alcohol use: No    Alcohol/week: 0.0 standard drinks  . Drug use: No    Family History  Problem Relation Age of Onset  . Emphysema Father   . Prostate cancer Father   . Liver disease Father   . Emphysema Maternal Grandmother   . Diabetes Mother   . Heart disease Mother   . Colon cancer Neg Hx     Review of Systems: A 12-system review of systems was performed and was negative except as noted in the HPI.  --------------------------------------------------------------------------------------------------  Physical Exam: BP (!) 158/95 (BP Location: Right Arm, Patient Position: Sitting, Cuff Size: Normal)   Pulse 94   Ht 5\' 3"  (1.6 m)   Wt 132 lb  4 oz (60 kg)   LMP 07/18/2016   BMI 23.43 kg/m   General: NAD. HEENT: No conjunctival pallor or scleral icterus.  Facemask in place. Neck: Supple without lymphadenopathy, thyromegaly, JVD, or HJR. No carotid bruit. Lungs: Normal work of breathing.  Mildly diminished breath sounds throughout without wheezes or crackles. Heart: Regular rate and rhythm without murmurs, rubs, or gallops. Non-displaced PMI. Abd: Bowel sounds present.  Soft with right upper quadrant tenderness and voluntary guarding to soft palpation (patient attributes this to knots in her liver).  No focal mass appreciated. Ext: No lower extremity edema. Radial, PT, and DP pulses are 2+ bilaterally Skin: Warm and dry without rash. Neuro: CNIII-XII intact. Strength and fine-touch sensation intact in upper and lower extremities bilaterally. Psych: Normal mood and affect.  EKG: Normal sinus rhythm without significant abnormality.  Lab Results  Component  Value Date   WBC 8.2 05/06/2018   HGB 13.4 05/06/2018   HCT 38.4 05/06/2018   MCV 89.1 05/06/2018   PLT 250 05/06/2018    Lab Results  Component Value Date   NA 137 05/06/2018   K 4.0 05/06/2018   CL 105 05/06/2018   CO2 21 (L) 05/06/2018   BUN 17 05/06/2018   CREATININE 0.80 07/13/2018   GLUCOSE 104 (H) 05/06/2018   ALT 25 05/06/2018    Lab Results  Component Value Date   CHOL 305 (H) 09/22/2015   HDL 83 09/22/2015   LDLCALC 174 (H) 09/22/2015   TRIG 241 (H) 09/22/2015   CHOLHDL 3.7 09/22/2015     --------------------------------------------------------------------------------------------------  ASSESSMENT AND PLAN: Chest pain and shortness of breath: Shortness of breath has been a longstanding issue and is most likely driven by underlying COPD.  Patient also reports intermittent chest pain that is not consistently exertional.  Ms. Marone has multiple cardiac risk factors but does not have any coronary artery calcification on my personal review of her chest CT from 07/13/2018.  We have agreed to perform an exercise tolerance test to evaluate for coronary insufficiency as well as assess the patient's functional status.  If this study is nondiagnostic, we would need to consider a dobutamine stress echocardiogram or cardiac CTA as next line testing.  Patient should continue to follow-up with pulmonology for management of her COPD.  Smoking cessation was encouraged.  Hypertension: Blood pressure mildly elevated today, though not on a consistent basis in the past.  Patient has been intolerant of antihypertensive medications in the past due to resultant hypotension.  Defer treatment at this time.  Low-sodium diet was encouraged.  Palpitations with questionable history of atrial fibrillation and pulmonary embolism: There is no objective evidence that either atrial fibrillation or PE have been diagnosed.  The patient denies ever having been placed on long-term anticoagulation he can  also not provide specifics about either diagnosis.  We discussed placement of an event monitor, given longstanding palpitations, but have agreed to defer this until after completion of the aforementioned exercise tolerance test.  Hyperlipidemia: No recent LDL in our system.  Recommend continued management per PCP with target LDL < 70 given history of stroke (currently on atorvastatin 80 mg daily).  Follow-up: Return to clinic in 1 month  [name], MD 09/16/2018 2:40 PM

## 2018-09-16 NOTE — Patient Instructions (Signed)
Medication Instructions:  Your physician recommends that you continue on your current medications as directed. Please refer to the Current Medication list given to you today.  If you need a refill on your cardiac medications before your next appointment, please call your pharmacy.   Lab work: - None ordered.  If you have labs (blood work) drawn today and your tests are completely normal, you will receive your results only by: Marland Kitchen MyChart Message (if you have MyChart) OR . A paper copy in the mail If you have any lab test that is abnormal or we need to change your treatment, we will call you to review the results.  Testing/Procedures: Your physician has requested that you have an exercise tolerance test. For further information please visit [email or web address]. Please also follow instruction sheet, as given.   DO NOT drink or eat foods with caffeine for 24 hours before the test. (Chocolate, coffee, tea, decaf coffee/tea, or energy drinks)  DO NOT smoke for 4 hours before your test.  If you use an inhaler, bring it with you to the test.  Wear comfortable shoes and clothing. Women do not wear dresses.   Follow-Up: At [facility], you and your health needs are our priority.  As part of our continuing mission to provide you with exceptional heart care, we have created designated Provider Care Teams.  These Care Teams include your primary Cardiologist (physician) and Advanced Practice Providers (APPs -  Physician Assistants and Nurse Practitioners) who all work together to provide you with the care you need, when you need it. You will need a follow up appointment in 1 months.  Please call our office 2 months in advance to schedule this appointment.  You may see DR Harrell Gave END or one of the following Advanced Practice Providers on your designated Care Team:   Murray Hodgkins, NP Christell Faith, PA-C . Marrianne Mood, PA-C     Exercise Stress Test An exercise stress test is a test to  check how your heart works during exercise. You will need to walk on a treadmill or ride an exercise bike for this test. An electrocardiogram (ECG) will record your heartbeat when you are at rest and when you are exercising. You may have an ultrasound or nuclear test after the exercise test. The test is done to check for coronary artery disease (CAD). It is also done to:  See how well you can exercise.  Watch for high blood pressure during exercise.  Test how well you can exercise after treatment.  Check the blood flow to your arms and legs. If your test result is not normal, more testing may be needed. What happens before the procedure?  Follow instructions from your doctor about what you cannot eat or drink. ? Do not have any drinks or foods that have caffeine in them for 24 hours before the test, or as told by your doctor. This includes coffee, tea (even decaf tea), sodas, chocolate, and cocoa.  Ask your doctor about changing or stopping your normal medicines. This is important if you: ? Take diabetes medicines. ? Take beta-blocker medicines. ? Wear a nitroglycerin patch.  If you use an inhaler, bring it with you to the test.  Do not put lotions, powders, creams, or oils on your chest before the test.  Wear comfortable shoes and clothing.  Do not use any products that have nicotine or tobacco in them, such as cigarettes and e-cigarettes. Stop using them at least 4 hours before the test. If you  need help quitting, ask your doctor. What happens during the procedure?   Patches (electrodes) will be put on your chest.  Wires will be connected to the patches. The wires will send signals to a machine to record your heartbeat.  Your heart rate will be watched while you are resting and while you are exercising. Your blood pressure will also be watched during the test.  You will walk on a treadmill or use a stationary bike. If you cannot use these, you may be asked to turn a crank with  your hands.  The activity will get harder and will raise your heart rate.  You may be asked to breathe into a tube a few times during the test. This measures the gases that you breathe out.  You will be asked how you are feeling throughout the test.  You will exercise until your heart reaches a target heart rate. You will stop early if: ? You feel dizzy. ? You have chest pain. ? You are out of breath. ? Your blood pressure is too high or too low. ? You have an irregular heartbeat. ? You have pain or aching in your arms or legs. The procedure may vary among doctors and hospitals. What happens after the procedure?  Your blood pressure, heart rate, breathing rate, and blood oxygen level will be watched after the test.  You may return to your normal diet and activities as told by your doctor.  It is up to you to get the results of your test. Ask your doctor, or the department that is doing the test, when your results will be ready. Summary  An exercise stress test is a test to check how your heart works during exercise.  This test is done to check for coronary artery disease.  Your heart rate will be watched while you are resting and while you are exercising.  Follow instructions from your doctor about what you cannot eat or drink before the test. This information is not intended to replace advice given to you by your health care provider. Make sure you discuss any questions you have with your health care provider. Document Released: 07/09/2007 Document Revised: 05/04/2018 Document Reviewed: 04/22/2016 Elsevier Patient Education  2020 [employer].

## 2018-09-17 ENCOUNTER — Encounter: Payer: Self-pay | Admitting: Internal Medicine

## 2018-09-17 DIAGNOSIS — R0602 Shortness of breath: Secondary | ICD-10-CM | POA: Insufficient documentation

## 2018-09-17 DIAGNOSIS — R002 Palpitations: Secondary | ICD-10-CM | POA: Insufficient documentation

## 2018-09-20 ENCOUNTER — Ambulatory Visit (INDEPENDENT_AMBULATORY_CARE_PROVIDER_SITE_OTHER): Payer: Medicare Other

## 2018-09-20 ENCOUNTER — Other Ambulatory Visit: Payer: Self-pay

## 2018-09-20 DIAGNOSIS — R079 Chest pain, unspecified: Secondary | ICD-10-CM | POA: Diagnosis not present

## 2018-09-21 LAB — EXERCISE TOLERANCE TEST
Estimated workload: 6.5 METS
Exercise duration (min): 4 min
Exercise duration (sec): 37 s
MPHR: 167 {beats}/min
Peak HR: 146 {beats}/min
Percent HR: 87 %
RPE: 13
Rest HR: 95 {beats}/min

## 2018-09-22 ENCOUNTER — Telehealth: Payer: Self-pay | Admitting: Internal Medicine

## 2018-09-22 DIAGNOSIS — R079 Chest pain, unspecified: Secondary | ICD-10-CM

## 2018-09-22 DIAGNOSIS — R0602 Shortness of breath: Secondary | ICD-10-CM

## 2018-09-22 NOTE — Telephone Encounter (Signed)
Notes recorded by Kimberly Bush, MD on 09/22/2018 at 11:30 AM EDT  Please let Kimberly Kemp know that her stress test was inconclusive due to motion artifact and limited exercise capacity. If she is agreeable, I recommend that we arrange for a dobutamine stress echo in [place] at her convenience.    Patient verbalized understanding of the results and is agreeable to plan to proceed with dobutamine stress echo in [place]. She is aware someone from scheduling will reach out to her concerning this. Message sent to scheduling.

## 2018-09-22 NOTE — Telephone Encounter (Signed)
Patient would like treadmill test results.

## 2018-09-22 NOTE — Progress Notes (Signed)
Reviewed and agree with assessment/plan.   Blondell Laperle, MD [facility] 01/30/2016, 12:24 PM Pager:  [phone]  

## 2018-09-24 NOTE — Telephone Encounter (Signed)
Called patient and she verbalized understanding to go to the [facility] testing for Covid on Monday, 09/27/18. She also verbalized understanding of the pre-procedural instructions below:  DO NOT drink or eat foods with caffeine for 24 hours before the test. (Chocolate, coffee, tea, decaf coffee/tea, or energy drinks)  DO NOT smoke for 4 hours before your test.  If you use an inhaler, bring it with you to the test.  Wear comfortable shoes and clothing. Women do not wear dresses.  Called Pre-admit testing and left message that patient coming for Covid swab on Monday.

## 2018-09-27 ENCOUNTER — Telehealth: Payer: Self-pay | Admitting: Internal Medicine

## 2018-09-27 ENCOUNTER — Other Ambulatory Visit: Admission: RE | Admit: 2018-09-27 | Payer: Medicare Other | Source: Ambulatory Visit

## 2018-09-27 ENCOUNTER — Telehealth (HOSPITAL_COMMUNITY): Payer: Self-pay | Admitting: *Deleted

## 2018-09-27 NOTE — Telephone Encounter (Signed)
Left message on voicemail in reference to upcoming appointment scheduled for 09/29/18. Phone number given for a call back so details instructions can be given. Kimberly Kemp

## 2018-09-27 NOTE — Telephone Encounter (Signed)
Call received from Kimberly Kemp today that the patient no showed for her pre-procedure COVID swab. She was to come today in preparation for her dobutamine stress echo scheduled for 09/29/18.   I called and spoke with the patient.  She advised that she had to unexpectedly go to [place] to visit her sister who is not doing well and is not expected to make it much longer.  She needing to cancel her test for now.   I advised I will notify Dr. Darnelle Bos nurse. I have asked that she call back when she is available to reschedule.  She voices understanding and is agreeable.

## 2018-09-28 NOTE — Telephone Encounter (Signed)
Patient will need to be contacted to schedule Stress Echo in September. Please call and schedule patient once september calendar is available

## 2018-09-29 ENCOUNTER — Other Ambulatory Visit (HOSPITAL_COMMUNITY): Payer: Medicare Other

## 2018-09-29 NOTE — Telephone Encounter (Signed)
Patient was called to set up stress echo, unable to leave voicemail

## 2018-10-01 NOTE — Telephone Encounter (Signed)
9/18  Scheduled by [facility] B   Patient aware  To go Tuesday for covid testing a to self quarantine until stress echo.

## 2018-10-05 ENCOUNTER — Other Ambulatory Visit: Admission: RE | Admit: 2018-10-05 | Payer: Medicare Other | Source: Ambulatory Visit

## 2018-10-06 ENCOUNTER — Other Ambulatory Visit: Payer: Self-pay | Admitting: Pulmonary Disease

## 2018-10-19 ENCOUNTER — Other Ambulatory Visit
Admission: RE | Admit: 2018-10-19 | Discharge: 2018-10-19 | Disposition: A | Discharge status: Home / Self Care | Payer: Medicare Other | Source: Ambulatory Visit | Attending: Internal Medicine | Admitting: Internal Medicine

## 2018-10-19 ENCOUNTER — Other Ambulatory Visit: Payer: Self-pay

## 2018-10-19 DIAGNOSIS — Z01812 Encounter for preprocedural laboratory examination: Secondary | ICD-10-CM | POA: Insufficient documentation

## 2018-10-19 DIAGNOSIS — Z20828 Contact with and (suspected) exposure to other viral communicable diseases: Secondary | ICD-10-CM | POA: Diagnosis present

## 2018-10-19 LAB — SARS CORONAVIRUS 2 (TAT 6-24 HRS): SARS Coronavirus 2: NEGATIVE

## 2018-10-21 ENCOUNTER — Ambulatory Visit: Payer: Medicare Other | Admitting: Internal Medicine

## 2018-10-22 ENCOUNTER — Other Ambulatory Visit: Payer: Medicare Other

## 2018-11-01 ENCOUNTER — Telehealth: Payer: Self-pay | Admitting: Internal Medicine

## 2018-11-01 NOTE — Telephone Encounter (Signed)
Patient states she had an episode yesterday that she felt her heart was fluttering and "jumping", states she feels like she was being crushed in her chest. Pt c/o of Chest Pain: STAT if CP now or developed within 24 hours  1. Are you having CP right now? No, yesterday felt like she was being crushed  2. Are you experiencing any other symptoms (ex. SOB, nausea, vomiting, sweating)? Fluttering and jumping in her heart  3. How long have you been experiencing CP? Past couple weeks  4. Is your CP continuous or coming and going? Comes and goes  5. Have you taken Nitroglycerin? no?

## 2018-11-01 NOTE — Telephone Encounter (Signed)
Spoke to patient. She reports 10-15 min episode of chest fluttering and jumping, pt reports that she had "crushing feeling in the chest". She was lying down watching tv and jumped up.   After this episode the felt fluttering and skipping throughout the day but no return of chest pain.   It looks like according to last OV note, stress echo was ordered and pt wants it to be performed at [facility] location.   After chart review, and speaking with ordering RN, the stress test is with dobutamine and will need to take place in [place]. Anderson Malta reached out again to [facility] to expedite order so it can be done in time for f/u appt.   I spoke with Dr. Saunders Revel who stated that best plan would be to get testing done. If patient has return chest pain, she should seek urgent medical care.   Called pt back and let her know POC. She verbalized understanding and will seek urgent medical care if chest pain returns.   Advised pt to call for any further questions or concerns.

## 2018-11-02 ENCOUNTER — Encounter (HOSPITAL_COMMUNITY): Payer: Self-pay | Admitting: Internal Medicine

## 2018-11-05 ENCOUNTER — Telehealth: Payer: Self-pay | Admitting: Internal Medicine

## 2018-11-05 NOTE — Telephone Encounter (Signed)
Patient had covid test today and that was too soon for 10/13 stress echo.    Previous appt on 10/5 .    Per pre admit patient will be added to 10/12 schedule to retest   Patient called and notified but she says she is being tested today for a GI procedure on 10/6   Patient is now confused about when to have testing and when to quarantine as she was told also to test on 10/9 by someone.  Please call to clarify .

## 2018-11-05 NOTE — Telephone Encounter (Signed)
Confirmed with PAT testing, Kimberly Kemp and patient that she will need to repeat CV testing next Friday for upcoming gxt in [place].   Advised pt to call for any further questions or concerns.

## 2018-11-08 ENCOUNTER — Other Ambulatory Visit (HOSPITAL_COMMUNITY): Payer: Medicare Other

## 2018-11-09 ENCOUNTER — Telehealth: Payer: Self-pay | Admitting: Internal Medicine

## 2018-11-09 NOTE — Telephone Encounter (Signed)
[facility] states they ordered a COVID test and it was resulted under cardiology. Please call to give results to [facility]. Fax [phone]

## 2018-11-09 NOTE — Telephone Encounter (Signed)
Results faxed to number provided via [facility] fax.

## 2018-11-10 ENCOUNTER — Encounter: Admission: RE | Payer: Self-pay | Source: Home / Self Care

## 2018-11-10 ENCOUNTER — Ambulatory Visit: Admission: RE | Admit: 2018-11-10 | Payer: Medicare Other | Source: Home / Self Care | Admitting: Internal Medicine

## 2018-11-10 SURGERY — COLONOSCOPY WITH PROPOFOL
Anesthesia: General

## 2018-11-12 ENCOUNTER — Other Ambulatory Visit: Admission: RE | Admit: 2018-11-12 | Payer: Medicare Other | Source: Ambulatory Visit

## 2018-11-12 ENCOUNTER — Other Ambulatory Visit (HOSPITAL_COMMUNITY): Payer: Medicare Other

## 2018-11-12 ENCOUNTER — Telehealth (HOSPITAL_COMMUNITY): Payer: Self-pay | Admitting: *Deleted

## 2018-11-12 ENCOUNTER — Other Ambulatory Visit (HOSPITAL_COMMUNITY)
Admission: RE | Admit: 2018-11-12 | Discharge: 2018-11-12 | Disposition: A | Discharge status: Home / Self Care | Payer: Medicare Other | Source: Ambulatory Visit | Attending: Internal Medicine | Admitting: Internal Medicine

## 2018-11-12 DIAGNOSIS — Z20828 Contact with and (suspected) exposure to other viral communicable diseases: Secondary | ICD-10-CM | POA: Diagnosis not present

## 2018-11-12 DIAGNOSIS — Z01812 Encounter for preprocedural laboratory examination: Secondary | ICD-10-CM | POA: Insufficient documentation

## 2018-11-12 NOTE — Telephone Encounter (Signed)
Patient given detailed instructions per Stress Test Requisition Sheet for test on 11/16/18 at 2:00.Patient Notified to arrive 30 minutes early, and that it is imperative to arrive on time for appointment to keep from having the test rescheduled.  Patient verbalized understanding. Kimberly Kemp

## 2018-11-15 LAB — NOVEL CORONAVIRUS, NAA (HOSP ORDER, SEND-OUT TO REF LAB; TAT 18-24 HRS): SARS-CoV-2, NAA: NOT DETECTED

## 2018-11-16 ENCOUNTER — Ambulatory Visit (HOSPITAL_COMMUNITY): Payer: Medicare Other

## 2018-11-16 ENCOUNTER — Other Ambulatory Visit: Payer: Self-pay

## 2018-11-16 ENCOUNTER — Ambulatory Visit (HOSPITAL_COMMUNITY): Payer: Medicare Other | Attending: Internal Medicine

## 2018-11-16 DIAGNOSIS — R0602 Shortness of breath: Secondary | ICD-10-CM | POA: Diagnosis not present

## 2018-11-16 DIAGNOSIS — R079 Chest pain, unspecified: Secondary | ICD-10-CM | POA: Diagnosis not present

## 2018-11-16 MED ORDER — SODIUM CHLORIDE 0.9 % IV SOLN
30.0000 ug/kg/min | Freq: Once | INTRAVENOUS | Status: AC
  Administered 2018-11-16: 1800 ug/min via INTRAVENOUS

## 2018-11-17 ENCOUNTER — Encounter: Payer: Self-pay | Admitting: Internal Medicine

## 2018-11-17 DIAGNOSIS — Z1389 Encounter for screening for other disorder: Secondary | ICD-10-CM | POA: Diagnosis not present

## 2018-11-17 DIAGNOSIS — N760 Acute vaginitis: Secondary | ICD-10-CM | POA: Diagnosis not present

## 2018-11-17 DIAGNOSIS — R300 Dysuria: Secondary | ICD-10-CM | POA: Diagnosis not present

## 2018-11-17 DIAGNOSIS — M1990 Unspecified osteoarthritis, unspecified site: Secondary | ICD-10-CM | POA: Diagnosis not present

## 2018-11-17 DIAGNOSIS — E7800 Pure hypercholesterolemia, unspecified: Secondary | ICD-10-CM | POA: Diagnosis not present

## 2018-11-17 DIAGNOSIS — E559 Vitamin D deficiency, unspecified: Secondary | ICD-10-CM | POA: Diagnosis not present

## 2018-11-17 DIAGNOSIS — I4891 Unspecified atrial fibrillation: Secondary | ICD-10-CM | POA: Diagnosis not present

## 2018-11-22 NOTE — Progress Notes (Signed)
Follow-up Outpatient Visit Date: 11/24/2018  Primary Care Provider: Martin Majestic, FNP [place] [id number]  Chief Complaint: Follow-up chest pain, palpitations, and shortness of breath  HPI:  Kimberly Kemp is a 53 y.o. year-old female with history of hypertension, hyperlipidemia, stroke, questionable paroxysmal atrial fibrillation, questionable pulmonary embolism, COPD, colon cancer, hepatitis B, anxiety, and tobacco use, who presents for follow-up of chest pain, shortness of breath, and palpitations.  I met her in mid-August, at which time she she had multiple complaints.  Exercise tolerance test was low to intermediate risk, significantly limited by decreased exercise capacity and significant motion artifact.  Subsequent dobutamine stress echocardiogram was normal without evidence of ischemia.  Today, Kimberly Kemp reports that her chronic shortness of breath is stable.  She continues to have chest pain "off and on," most recently this morning while taking her son to the dentist.  The pain initially began in her left arm and then moved to her chest.  There were no associated symptoms, with the discomfort gradually resolving on its own.  She continues to have palpitations most days with associated dizziness.  These have been present for a long time and are unchanged.  She is concerned that her palpitations may be associated with atorvastatin, as she began to feel like her heart would slow down at times after the medication was started years ago.  She denies orthopnea, PND, and edema.  Home BP readings are similar to today's office measurement.  --------------------------------------------------------------------------------------------------  Cardiovascular History & Procedures: Cardiovascular Problems:  Paroxysmal atrial fibrillation (questionable; no objective documentation in the patient's chart)  Pulmonary embolism (questionable; no objective documentation in the patient's  chart)  Shortness of breath  Risk Factors:  Prior stroke, hypertension, hyperlipidemia, and tobacco use  Cath/PCI:  None  CV Surgery:  None  EP Procedures and Devices:  None  Non-Invasive Evaluation(s):  Dobutamine stress echocardiogram (11/16/2018): Low risk study with normal baseline LVEF and no evidence of inducible wall motion abnormality.  LVEF 55-60%.  Exercise tolerance test (09/20/2018): Low to intermediate risk study with decreased exercise capacity and significant motion artifact.  Recent CV Pertinent Labs: Lab Results  Component Value Date   CHOL 305 (H) 09/22/2015   HDL 83 09/22/2015   LDLCALC 174 (H) 09/22/2015   TRIG 241 (H) 09/22/2015   CHOLHDL 3.7 09/22/2015   K 4.0 05/06/2018   BUN 17 05/06/2018   CREATININE 0.80 07/13/2018    Past medical and surgical history were reviewed and updated in EPIC.  Current Meds  Medication Sig  . albuterol (PROAIR HFA) 108 (90 Base) MCG/ACT inhaler INHALE 2 PUFFS INTO THE LUNGS EVERY FOUR HOURS AS NEEDED FOR WHEEZING (Patient taking differently: Inhale 2 puffs into the lungs every 4 (four) hours as needed for wheezing or shortness of breath. )  . albuterol (PROVENTIL) (2.5 MG/3ML) 0.083% nebulizer solution TAKE 3 MLS (2.5 MG TOTAL) BY NEBULIZATION 4 TIMES DAILY AS NEEDED FOR WHEEZING OR SHORTNESS OF BREATH  . aspirin EC 81 MG tablet Take 81 mg by mouth every morning.  Marland Kitchen atorvastatin (LIPITOR) 80 MG tablet Take 80 mg by mouth at bedtime.  . benzonatate (TESSALON) 200 MG capsule Take 1 capsule (200 mg total) by mouth 3 (three) times daily as needed for cough.  . budesonide (PULMICORT) 0.25 MG/2ML nebulizer solution TAKE 2 MLS (0.25 MG TOTAL) BY NEBULIZATION 2 (TWO) TIMES DAILY.  . butalbital-acetaminophen-caffeine (FIORICET, ESGIC) 50-325-40 MG tablet Take 1 tablet by mouth 3 (three) times daily as  needed for headache.  . dextromethorphan-guaiFENesin (MUCINEX DM) 30-600 MG per 12 hr tablet Take 1 tablet by mouth every  12 (twelve) hours.    . DULoxetine (CYMBALTA) 20 MG capsule TAKE ONE CAPSULE BY MOUTH EVERY DAY (Patient taking differently: Take 20 mg by mouth daily. )  . gabapentin (NEURONTIN) 300 MG capsule Take 300 mg by mouth at bedtime.  . hydrOXYzine (VISTARIL) 50 MG capsule Take 50 mg by mouth 2 (two) times daily as needed for anxiety.  Marland Kitchen ipratropium (ATROVENT) 0.02 % nebulizer solution TAKE 1 VIAL BY NEBULIZER EVERY 4 HOURS AS NEEDED  . magic mouthwash w/lidocaine SOLN Take 10 mLs by mouth 4 (four) times daily as needed for mouth pain.  . meloxicam (MOBIC) 15 MG tablet Take 1 tablet (15 mg total) by mouth daily.  Marland Kitchen nystatin (MYCOSTATIN) 100000 UNIT/ML suspension Take 5 mLs (500,000 Units total) by mouth 4 (four) times daily. Swish and swallow  . omeprazole (PRILOSEC) 40 MG capsule TAKE 1 CAPSULE BY MOUTH EVERY DAY (Patient taking differently: Take 40 mg by mouth daily. )  . ondansetron (ZOFRAN) 4 MG tablet Take 4 mg by mouth 3 (three) times daily.  Marland Kitchen PAZEO 0.7 % SOLN Place 1 drop into both eyes daily.  . Probiotic Product (PROBIOTIC-10 PO) Take 1 tablet by mouth daily.  . promethazine (PHENERGAN) 25 MG tablet Take 25 mg by mouth 4 (four) times daily.  . promethazine-codeine (PHENERGAN WITH CODEINE) 6.25-10 MG/5ML syrup Take 5 mLs by mouth every 6 (six) hours as needed for cough.    Allergies: Penicillins, Red dye, and Omnipaque [iohexol]  Social History   Tobacco Use  . Smoking status: Current Every Day Smoker    Packs/day: 0.50    Years: 43.00    Pack years: 21.50    Types: Cigarettes  . Smokeless tobacco: Never Used  . Tobacco comment: cutting back  Substance Use Topics  . Alcohol use: No    Alcohol/week: 0.0 standard drinks  . Drug use: No    Family History  Problem Relation Age of Onset  . Emphysema Father   . Prostate cancer Father   . Liver disease Father   . Emphysema Maternal Grandmother   . Diabetes Mother   . Heart disease Mother 2       CABG and valve replacement  .  Colon cancer Neg Hx     Review of Systems: A 12-system review of systems was performed and was negative except as noted in the HPI.  --------------------------------------------------------------------------------------------------  Physical Exam: BP 120/72 (BP Location: Left Arm, Patient Position: Sitting, Cuff Size: Normal)   Pulse 84   Ht 5' 3"  (1.6 m)   Wt 130 lb 12 oz (59.3 kg)   LMP 07/18/2016   SpO2 99%   BMI 23.16 kg/m   General:  NAD. HEENT: No conjunctival pallor or scleral icterus. Neck: Supple without lymphadenopathy, thyromegaly, JVD, or HJR. Lungs: Normal work of breathing. Clear to auscultation bilaterally without wheezes or crackles. Heart: Regular rate and rhythm without murmurs, rubs, or gallops. Non-displaced PMI. Abd: Bowel sounds present. Soft, NT/ND without hepatosplenomegaly Ext: No lower extremity edema. Radial, PT, and DP pulses are 2+ bilaterally. Skin: Warm and dry without rash.  EKG:  Normal sinus rhythm without abnormality.  Lab Results  Component Value Date   WBC 8.2 05/06/2018   HGB 13.4 05/06/2018   HCT 38.4 05/06/2018   MCV 89.1 05/06/2018   PLT 250 05/06/2018    Lab Results  Component Value Date  NA 137 05/06/2018   K 4.0 05/06/2018   CL 105 05/06/2018   CO2 21 (L) 05/06/2018   BUN 17 05/06/2018   CREATININE 0.80 07/13/2018   GLUCOSE 104 (H) 05/06/2018   ALT 25 05/06/2018    Lab Results  Component Value Date   CHOL 305 (H) 09/22/2015   HDL 83 09/22/2015   LDLCALC 174 (H) 09/22/2015   TRIG 241 (H) 09/22/2015   CHOLHDL 3.7 09/22/2015    --------------------------------------------------------------------------------------------------  ASSESSMENT AND PLAN: Chest pain: Pain remains atypical, coming on randomly during the day.  It is not exertional.  Recent dobutamine stress echo was low risk without evidence of ischemia.  LVEF was normal at baseline.  Today's EKG is also normal.  I have a low suspicion for obstructive CAD  underlying her chest pain, though likely familial hyperlipidemia places her at increased risk for ASCVD.  Given frequent palpitations, we will try start diltiazem 120 mg daily in case there is also a component of coronary vasospasm.  No further testing is recommended at this time, though is symptoms persist, we may need to consider cardiac catheterization.  Palpitations: Longstanding and stable.  We have agreed to a trial of diltiazem 120 mg daily.  If symptoms persist/worsen, we will pursue ambulatory cardiac monitoring.  Shortness of breath: Most likely related to underlying lung disease.  Recent DSE without evidence of cardiomyopathy or ischemia.  Continue current inhalers and pulmonary follow-up.  Hyperlipidemia: Recent outside lipid panel was notable for LDL of 222 in spite of taking atorvastatin 80 mg daily.  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  I will refer her to the lipid clinic for further evaluation and management.  Goal LDL is less than 70, given history of stroke.  Follow-up: Return to clinic in 6 weeks.  [name], MD 11/24/2018 1:28 PM

## 2018-11-24 ENCOUNTER — Ambulatory Visit (INDEPENDENT_AMBULATORY_CARE_PROVIDER_SITE_OTHER): Payer: Medicare Other | Admitting: Internal Medicine

## 2018-11-24 ENCOUNTER — Other Ambulatory Visit: Payer: Self-pay

## 2018-11-24 ENCOUNTER — Encounter: Payer: Self-pay | Admitting: Internal Medicine

## 2018-11-24 VITALS — BP 120/72 | HR 84 | Ht 63.0 in | Wt 130.8 lb

## 2018-11-24 DIAGNOSIS — R079 Chest pain, unspecified: Secondary | ICD-10-CM

## 2018-11-24 DIAGNOSIS — R002 Palpitations: Secondary | ICD-10-CM

## 2018-11-24 DIAGNOSIS — R0602 Shortness of breath: Secondary | ICD-10-CM | POA: Diagnosis not present

## 2018-11-24 DIAGNOSIS — E785 Hyperlipidemia, unspecified: Secondary | ICD-10-CM | POA: Diagnosis not present

## 2018-11-24 MED ORDER — DILTIAZEM HCL ER COATED BEADS 120 MG PO CP24: 120.0000 mg | ORAL_CAPSULE | Freq: Every day | ORAL | 3 refills | Status: DC

## 2018-11-24 NOTE — Patient Instructions (Signed)
Medication Instructions:  Your physician has recommended you make the following change in your medication:  1- START Diltiazem 120mg   (1 tablet) by mouth once a day.  *If you need a refill on your cardiac medications before your next appointment, please call your pharmacy*  Lab Work: none If you have labs (blood work) drawn today and your tests are completely normal, you will receive your results only by: Marland Kitchen MyChart Message (if you have MyChart) OR . A paper copy in the mail If you have any lab test that is abnormal or we need to change your treatment, we will call you to review the results.  Testing/Procedures: none  Follow-Up: At [facility], you and your health needs are our priority.  As part of our continuing mission to provide you with exceptional heart care, we have created designated Provider Care Teams.  These Care Teams include your primary Cardiologist (physician) and Advanced Practice Providers (APPs -  Physician Assistants and Nurse Practitioners) who all work together to provide you with the care you need, when you need it.  Your next appointment:   6 weeks.   The format for your next appointment:   In Person  Provider:    You may see DR Harrell Gave END or one of the following Advanced Practice Providers on your designated Care Team:    Murray Hodgkins, NP  Christell Faith, PA-C  Marrianne Mood, PA-C   Other Instructions  Referral to [facility] - Dr Debara Pickett - prefers virtual visit.

## 2018-11-25 ENCOUNTER — Encounter: Payer: Self-pay | Admitting: Internal Medicine

## 2018-12-08 ENCOUNTER — Encounter: Payer: Self-pay | Admitting: Primary Care

## 2018-12-08 ENCOUNTER — Emergency Department: Payer: Medicare Other

## 2018-12-08 ENCOUNTER — Other Ambulatory Visit: Payer: Self-pay

## 2018-12-08 ENCOUNTER — Ambulatory Visit (INDEPENDENT_AMBULATORY_CARE_PROVIDER_SITE_OTHER): Payer: Medicare Other | Admitting: Primary Care

## 2018-12-08 ENCOUNTER — Encounter: Payer: Self-pay | Admitting: Emergency Medicine

## 2018-12-08 ENCOUNTER — Telehealth: Payer: Self-pay | Admitting: Pulmonary Disease

## 2018-12-08 ENCOUNTER — Emergency Department
Admission: EM | Admit: 2018-12-08 | Discharge: 2018-12-08 | Disposition: A | Discharge status: Home / Self Care | Payer: Medicare Other | Attending: Emergency Medicine | Admitting: Emergency Medicine

## 2018-12-08 DIAGNOSIS — I10 Essential (primary) hypertension: Secondary | ICD-10-CM | POA: Diagnosis not present

## 2018-12-08 DIAGNOSIS — Z20828 Contact with and (suspected) exposure to other viral communicable diseases: Secondary | ICD-10-CM | POA: Insufficient documentation

## 2018-12-08 DIAGNOSIS — Z880 Allergy status to penicillin: Secondary | ICD-10-CM | POA: Insufficient documentation

## 2018-12-08 DIAGNOSIS — J441 Chronic obstructive pulmonary disease with (acute) exacerbation: Secondary | ICD-10-CM

## 2018-12-08 DIAGNOSIS — Z91041 Radiographic dye allergy status: Secondary | ICD-10-CM | POA: Diagnosis not present

## 2018-12-08 DIAGNOSIS — I480 Paroxysmal atrial fibrillation: Secondary | ICD-10-CM | POA: Diagnosis not present

## 2018-12-08 DIAGNOSIS — F17210 Nicotine dependence, cigarettes, uncomplicated: Secondary | ICD-10-CM | POA: Insufficient documentation

## 2018-12-08 DIAGNOSIS — Z7952 Long term (current) use of systemic steroids: Secondary | ICD-10-CM | POA: Insufficient documentation

## 2018-12-08 DIAGNOSIS — J439 Emphysema, unspecified: Secondary | ICD-10-CM | POA: Diagnosis not present

## 2018-12-08 DIAGNOSIS — Z85038 Personal history of other malignant neoplasm of large intestine: Secondary | ICD-10-CM | POA: Insufficient documentation

## 2018-12-08 DIAGNOSIS — Z7982 Long term (current) use of aspirin: Secondary | ICD-10-CM | POA: Insufficient documentation

## 2018-12-08 DIAGNOSIS — Z79899 Other long term (current) drug therapy: Secondary | ICD-10-CM | POA: Insufficient documentation

## 2018-12-08 DIAGNOSIS — J069 Acute upper respiratory infection, unspecified: Secondary | ICD-10-CM | POA: Diagnosis not present

## 2018-12-08 DIAGNOSIS — B9789 Other viral agents as the cause of diseases classified elsewhere: Secondary | ICD-10-CM | POA: Diagnosis not present

## 2018-12-08 DIAGNOSIS — E782 Mixed hyperlipidemia: Secondary | ICD-10-CM | POA: Insufficient documentation

## 2018-12-08 DIAGNOSIS — R062 Wheezing: Secondary | ICD-10-CM | POA: Diagnosis present

## 2018-12-08 MED ORDER — PROMETHAZINE-CODEINE 6.25-10 MG/5ML PO SYRP: 5.0000 mL | ORAL_SOLUTION | Freq: Four times a day (QID) | ORAL | 0 refills | Status: DC | PRN

## 2018-12-08 MED ORDER — PREDNISONE 10 MG PO TABS: ORAL_TABLET | ORAL | 0 refills | Status: DC

## 2018-12-08 MED ORDER — BENZONATATE 100 MG PO CAPS: 100.0000 mg | ORAL_CAPSULE | Freq: Four times a day (QID) | ORAL | 0 refills | Status: DC | PRN

## 2018-12-08 MED ORDER — PREDNISONE 50 MG PO TABS: 50.0000 mg | ORAL_TABLET | Freq: Every day | ORAL | 0 refills | Status: DC

## 2018-12-08 NOTE — Patient Instructions (Addendum)
Orders: CXR r/o PNA  Rx: Prednisone taper as prescribed Promethazine-codeine 72ml q 6 hours for cough suppression  Follow-up If symptoms do not improve or worsen

## 2018-12-08 NOTE — Progress Notes (Signed)
Virtual Visit via Telephone Note  I connected with Kimberly Kemp on 12/08/18 at  3:30 PM EST by telephone and verified that I am speaking with the correct person using two identifiers.  Location: Patient: Home Provider: Office   I discussed the limitations, risks, security and privacy concerns of performing an evaluation and management service by telephone and the availability of in person appointments. I also discussed with the patient that there may be a patient responsible charge related to this service. The patient expressed understanding and agreed to proceed.   History of Present Illness: 53 year old female, current every day smoker. PMH significant for COPD, emphysema, GERD, hypertension, stroke, hyperlipidemia, anxiety. Patient of Dr. Halford Chessman, last seen by pulmonary nurse practitioner on 07/14/18. Maintained on Budesonide BID and ipratropium/albuterol QID.   12/08/2018 Patient contacted today for acute televisit. She reports chest congestion with non-productive cough, chest tightness and wheezing x 3 days. She is not significantly short of breath. She has been taking Pulmicort nebulizer as prescribed twice daily. She was around her grandson who was recently sick. She was covid negative 3 weeks ago. Denies fever, chills, nasal congestion, N/V/D.   TEST/EVENTS :  Echo 04/15/06>>EF 55 to 65%  Pulmonary tests A1AT 09/04/06 >> 236 Spirometry 10/26/09>>FEV1 2.59(88%), FEV1% 73 RAST 01/22/10 >> negative, IgE 11.9 Labs 01/22/10 >> ACE 46, RF < 20, ANA negative, ANCA negative CT chest 06/26/10 >> upper lobe emphysema CT chest 06/10/13 >> minimal centrilobular emphysema 07/14/18 PFT- FEV1 86% , ratio 70, + BD response, Midflow obstruction/reversibility. DLCO 70% .   Sleep tests Home sleep study 09/13/10>>AHI 1, SpO2 low 91%  CT chest done 6/9 showed emphysema , no adenopathy, no acute process   Observations/Objective:  - Appears moderately short of breath during phone conversation,  answering in 1-2 words  - No overt wheezing or cough  Assessment and Plan:  COPD exacerbation - Non-productive cough with associated wheezing - RX prednisone taper (40mg  x 2 days; 30mg  x 2 days; 20mg  x 2 days; 10mg  x 2 days) - Promethazine-codeine 16ml q 6 hours for cough suppression - CXR to r/o pneumonia   Follow Up Instructions:   - Call or return if symptoms do not improve or worsen  I discussed the assessment and treatment plan with the patient. The patient was provided an opportunity to ask questions and all were answered. The patient agreed with the plan and demonstrated an understanding of the instructions.   The patient was advised to call back or seek an in-person evaluation if the symptoms worsen or if the condition fails to improve as anticipated.  I provided 18 minutes of non-face-to-face time during this encounter.   Martyn Ehrich, NP

## 2018-12-08 NOTE — ED Triage Notes (Signed)
Pt in via POV, states, "I just havent been feeling well, 2 of my grandbabies had a cold."  Reports chest congestion and states PCP advised to be seen here for chest xray.  Ambulatory to triage, vitals WDL, NAD noted at this time.

## 2018-12-08 NOTE — Telephone Encounter (Signed)
Spoke with the pt  She states that she kept her grandkids over the wkend and she is now having sore throat, cough- non prod, wheezing, increased SOB  Televisit with Beth scheduled for today  I offered the pt a video visit but she states not video capable

## 2018-12-08 NOTE — ED Provider Notes (Signed)
[facility] Emergency Department Provider Note  ____________________________________________  Time seen: Approximately 6:46 PM  I have reviewed the triage vital signs and the nursing notes.   HISTORY  Chief Complaint URI    HPI Kimberly Kemp is a 53 y.o. female who presents emergency department for cough, wheezing.  Patient had a virtual visit with her primary care today and was referred to emergency department for x-ray of her chest.  Patient has a history of emphysema/COPD.  Patient took care of her grandkids who had a common "cold."  Patient has developed some scratchy throat as well as a cough.  Cough is nonproductive.  No fevers or chills, nasal congestion, shortness of breath, abdominal pain, nausea vomiting, diarrhea or constipation.  Patient takes daily medications for her COPD and has a rescue inhaler if needed.  Patient has not had to use her rescue inhaler for her symptoms.  Patient states that her primary care was concerned for pneumonia as she does have a history of pneumonia and sent her to the emergency department for evaluation.         Past Medical History:  Diagnosis Date  . Anxiety   . Asthma   . Cancer ([place])    colon ca   . Chronic pain   . Cirrhosis ([place])   . COPD (chronic obstructive pulmonary disease) ([place])   . Depression   . Emphysema   . GERD (gastroesophageal reflux disease)   . Heart murmur   . Hepatitis B   . Hyperlipidemia   . Hypertension   . Paroxysmal atrial fibrillation ([facility])   . Pulmonary embolism ([place])   . Stroke ([place])   . Tobacco abuse     Patient Active Problem List   Diagnosis Date Noted  . Shortness of breath 09/17/2018  . Palpitations 09/17/2018  . Right lower lobe pneumonia 06/11/2018  . Generalized anxiety disorder 10/10/2015  . COPD exacerbation ([place]) 09/22/2015  . Essential hypertension 09/22/2015  . History of stroke 09/22/2015  . Leukocytosis 09/22/2015  . Acute respiratory failure ([place])   .  Hyperlipidemia LDL goal <70   . Acute exacerbation of chronic obstructive pulmonary disease (COPD) ([place]) 09/04/2015  . Acute bronchitis 04/04/2015  . Hemoptysis 06/30/2014  . Thrush 07/22/2013  . Chest pain 04/27/2013  . GERD (gastroesophageal reflux disease) 06/18/2012  . Chronic cough 06/18/2012  . EMPHYSEMA 05/27/2007  . Tobacco abuse 12/11/2006    Past Surgical History:  Procedure Laterality Date  . APPENDECTOMY     microscopic   . BRONCHOSCOPY  March 2008, Feb 2012  . CESAREAN SECTION      Prior to Admission medications   Medication Sig Start Date End Date Taking? Authorizing Provider  albuterol (PROAIR HFA) 108 (90 Base) MCG/ACT inhaler INHALE 2 PUFFS INTO THE LUNGS EVERY FOUR HOURS AS NEEDED FOR WHEEZING Patient taking differently: Inhale 2 puffs into the lungs every 4 (four) hours as needed for wheezing or shortness of breath.  08/07/17   Chesley Mires, MD  albuterol (PROVENTIL) (2.5 MG/3ML) 0.083% nebulizer solution TAKE 3 MLS (2.5 MG TOTAL) BY NEBULIZATION 4 TIMES DAILY AS NEEDED FOR WHEEZING OR SHORTNESS OF BREATH 07/06/18   Chesley Mires, MD  aspirin EC 81 MG tablet Take 81 mg by mouth every morning.    [provider]  atorvastatin (LIPITOR) 80 MG tablet Take 80 mg by mouth at bedtime. 10/17/17   [provider]  benzonatate (TESSALON PERLES) 100 MG capsule Take 1 capsule (100 mg total) by mouth  every 6 (six) hours as needed. 12/08/18 12/08/19  Cuthriell, Roderic Palau D, PA-C  budesonide (PULMICORT) 0.25 MG/2ML nebulizer solution TAKE 2 MLS (0.25 MG TOTAL) BY NEBULIZATION 2 (TWO) TIMES DAILY. 06/23/18   Chesley Mires, MD  butalbital-acetaminophen-caffeine (FIORICET, ESGIC) [phone] MG tablet Take 1 tablet by mouth 3 (three) times daily as needed for headache. 11/19/17   [provider]  dextromethorphan-guaiFENesin (MUCINEX DM) 30-600 MG per 12 hr tablet Take 1 tablet by mouth every 12 (twelve) hours.      [provider]  diltiazem (CARDIZEM CD) 120  MG 24 hr capsule Take 1 capsule (120 mg total) by mouth daily. 11/24/18 02/22/19  End, Harrell Gave, MD  DULoxetine (CYMBALTA) 20 MG capsule TAKE ONE CAPSULE BY MOUTH EVERY DAY Patient taking differently: Take 20 mg by mouth daily.  03/03/16   Lorella Nimrod, MD  gabapentin (NEURONTIN) 300 MG capsule Take 300 mg by mouth at bedtime. 11/14/17   [provider]  hydrOXYzine (VISTARIL) 50 MG capsule Take 50 mg by mouth 2 (two) times daily as needed for anxiety. 11/11/17   [provider]  ipratropium (ATROVENT) 0.02 % nebulizer solution TAKE 1 VIAL BY NEBULIZER EVERY 4 HOURS AS NEEDED 10/06/18   Chesley Mires, MD  magic mouthwash w/lidocaine SOLN Take 10 mLs by mouth 4 (four) times daily as needed for mouth pain. 09/10/18   Triplett, Johnette Abraham B, FNP  meloxicam (MOBIC) 15 MG tablet Take 1 tablet (15 mg total) by mouth daily. 01/18/18   Cuthriell, Charline Bills, PA-C  omeprazole (PRILOSEC) 40 MG capsule TAKE 1 CAPSULE BY MOUTH EVERY DAY Patient taking differently: Take 40 mg by mouth daily.  06/02/17   Chesley Mires, MD  ondansetron (ZOFRAN) 4 MG tablet Take 4 mg by mouth 3 (three) times daily. 10/14/17   [provider]  PAZEO 0.7 % SOLN Place 1 drop into both eyes daily. 11/26/17   [provider]  predniSONE (DELTASONE) 50 MG tablet Take 1 tablet (50 mg total) by mouth daily with breakfast. 12/08/18   Cuthriell, Charline Bills, PA-C  Probiotic Product (PROBIOTIC-10 PO) Take 1 tablet by mouth daily.    [provider]  promethazine (PHENERGAN) 25 MG tablet Take 25 mg by mouth 4 (four) times daily. 11/09/17   [provider]  promethazine-codeine (PHENERGAN WITH CODEINE) 6.25-10 MG/5ML syrup Take 5 mLs by mouth every 6 (six) hours as needed for cough. 12/08/18   Martyn Ehrich, NP    Allergies Penicillins, Omnipaque [iohexol], and Red dye  Family History  Problem Relation Age of Onset  . Emphysema Father   . Prostate cancer Father   . Liver disease Father   .  Emphysema Maternal Grandmother   . Diabetes Mother   . Heart disease Mother 77       CABG and valve replacement  . Colon cancer Neg Hx     Social History Social History   Tobacco Use  . Smoking status: Current Every Day Smoker    Packs/day: 0.50    Years: 43.00    Pack years: 21.50    Types: Cigarettes  . Smokeless tobacco: Never Used  . Tobacco comment: cutting back  Substance Use Topics  . Alcohol use: No    Alcohol/week: 0.0 standard drinks  . Drug use: No     Review of Systems  Constitutional: No fever/chills Eyes: No visual changes. No discharge ENT: Positive for "scratchy" throat Cardiovascular: no chest pain. Respiratory: Positive cough.  Positive for wheezing.  No SOB. Gastrointestinal: No  abdominal pain.  No nausea, no vomiting.  Musculoskeletal: Negative for musculoskeletal pain. Skin: Negative for rash, abrasions, lacerations, ecchymosis. Neurological: Negative for headaches, focal weakness or numbness. 10-point ROS otherwise negative.  ____________________________________________   PHYSICAL EXAM:  VITAL SIGNS: ED Triage Vitals [12/08/18 1656]  Enc Vitals Group     BP (!) 141/101     Pulse Rate 90     Resp 16     Temp 98.7 F (37.1 C)     Temp Source Oral     SpO2 99 %     Weight 125 lb (56.7 kg)     Height 5\' 3"  (1.6 m)     Head Circumference      Peak Flow      Pain Score 8     Pain Loc      Pain Edu?      Excl. in [place]?      Constitutional: Alert and oriented. Well appearing and in no acute distress. Eyes: Conjunctivae are normal. PERRL. EOMI. Head: Atraumatic. ENT:      Ears:       Nose: No congestion/rhinnorhea.      Mouth/Throat: Mucous membranes are moist.  Neck: No stridor.  Neck is supple full range of motion Hematological/Lymphatic/Immunilogical: No cervical lymphadenopathy. Cardiovascular: Normal rate, regular rhythm. Normal S1 and S2.  Good peripheral circulation. Respiratory: Normal respiratory effort without tachypnea or  retractions. Lungs with few scattered respiratory wheezes.  No rales or rhonchi.  No inspiratory wheezing good air entry to the bases with no decreased or absent breath sounds. Musculoskeletal: Full range of motion to all extremities. No gross deformities appreciated. Neurologic:  Normal speech and language. No gross focal neurologic deficits are appreciated.  Skin:  Skin is warm, dry and intact. No rash noted. Psychiatric: Mood and affect are normal. Speech and behavior are normal. Patient exhibits appropriate insight and judgement.   ____________________________________________   LABS (all labs ordered are listed, but only abnormal results are displayed)  Labs Reviewed - No data to display ____________________________________________  EKG   ____________________________________________  RADIOLOGY I personally viewed and evaluated these images as part of my medical decision making, as well as reviewing the written report by the radiologist.  Dg Chest 2 View  Result Date: 12/08/2018 CLINICAL DATA:  Chest congestion. EXAM: CHEST - 2 VIEW COMPARISON:  05/24/2018 FINDINGS: Normal heart size. Lungs are hyperinflated and there are coarsened interstitial markings identified bilaterally. No airspace consolidation identified. The visualized osseous structures are unremarkable. IMPRESSION: 1. No acute cardiopulmonary abnormality. 2. Emphysema. Electronically Signed   By: Kerby Moors M.D.   On: 12/08/2018 18:05    ____________________________________________    PROCEDURES  Procedure(s) performed:    Procedures    Medications - No data to display   ____________________________________________   INITIAL IMPRESSION / ASSESSMENT AND PLAN / ED COURSE  Pertinent labs & imaging results that were available during my care of the patient were reviewed by me and considered in my medical decision making (see chart for details).  Review of the [place] CSRS was performed in accordance of the  [place] prior to dispensing any controlled drugs.           Patient's diagnosis is consistent with viral URI/COPD exacerbation.  Patient presented to the emergency department for chest x-ray.  Patient had been evaluated by her primary care provider via telemedicine earlier today.  Patient was sent to the emergency department for chest x-ray to ensure no pneumonia.  Exam showed few scattered expiratory  wheezes but no significant inspiratory or expiratory wheezing.  No rales or rhonchi.  Chest x-ray was reassuring with no consolidation concerning for pneumonia.  I suspect viral respiratory infection with mild COPD exacerbation.  Patient will be placed on a short course of steroids, she is instructed to use her albuterol every 2-4 hours whether she experiences wheezing or not.  Return precautions are discussed with the patient.  Patient will return for any increased shortness of breath, fevers or chills that do not respond to medication, worsening overall of her condition.  Patient will be tested for COVID-19, however I feel this is likely a viral URI which is COPD exacerbation..  No indication at this time for further work-up.  Patient is stable for discharge.  Patient is given ED precautions to return to the ED for any worsening or new symptoms.     ____________________________________________  FINAL CLINICAL IMPRESSION(S) / ED DIAGNOSES  Final diagnoses:  Viral URI  COPD exacerbation ([place])      NEW MEDICATIONS STARTED DURING THIS VISIT:  ED Discharge Orders         Ordered    predniSONE (DELTASONE) 50 MG tablet  Daily with breakfast     12/08/18 1858    benzonatate (TESSALON PERLES) 100 MG capsule  Every 6 hours PRN     12/08/18 1858              This chart was dictated using voice recognition software/Dragon. Despite best efforts to proofread, errors can occur which can change the meaning. Any change was purely unintentional.    Darletta Moll, PA-C 12/08/18 Sharyon Medicus, MD 12/08/18 [phone]

## 2018-12-10 ENCOUNTER — Encounter: Payer: Self-pay | Admitting: Primary Care

## 2018-12-10 LAB — NOVEL CORONAVIRUS, NAA (HOSP ORDER, SEND-OUT TO REF LAB; TAT 18-24 HRS): SARS-CoV-2, NAA: NOT DETECTED

## 2018-12-10 NOTE — Progress Notes (Signed)
Reviewed and agree with assessment/plan.   Auriella Wieand, MD [facility] 01/30/2016, 12:24 PM Pager:  [phone]  

## 2018-12-13 ENCOUNTER — Ambulatory Visit (INDEPENDENT_AMBULATORY_CARE_PROVIDER_SITE_OTHER): Payer: Medicare Other | Admitting: Pulmonary Disease

## 2018-12-13 ENCOUNTER — Other Ambulatory Visit: Payer: Self-pay

## 2018-12-13 ENCOUNTER — Encounter: Payer: Self-pay | Admitting: Pulmonary Disease

## 2018-12-13 VITALS — BP 132/86 | HR 96 | Temp 98.2°F | Ht 63.0 in | Wt 127.8 lb

## 2018-12-13 DIAGNOSIS — J432 Centrilobular emphysema: Secondary | ICD-10-CM | POA: Diagnosis not present

## 2018-12-13 DIAGNOSIS — Z720 Tobacco use: Secondary | ICD-10-CM | POA: Diagnosis not present

## 2018-12-13 DIAGNOSIS — J441 Chronic obstructive pulmonary disease with (acute) exacerbation: Secondary | ICD-10-CM

## 2018-12-13 MED ORDER — BREZTRI AEROSPHERE 160-9-4.8 MCG/ACT IN AERO: 2.0000 | INHALATION_SPRAY | Freq: Two times a day (BID) | RESPIRATORY_TRACT | 0 refills | Status: DC

## 2018-12-13 MED ORDER — DOXYCYCLINE HYCLATE 100 MG PO TABS: 100.0000 mg | ORAL_TABLET | Freq: Two times a day (BID) | ORAL | 0 refills | Status: DC

## 2018-12-13 MED ORDER — TRELEGY ELLIPTA 100-62.5-25 MCG/INH IN AEPB: 1.0000 | INHALATION_SPRAY | Freq: Every day | RESPIRATORY_TRACT | 0 refills | Status: DC

## 2018-12-13 NOTE — Patient Instructions (Addendum)
Finish course of prednisone from the ER Doxycycline 100 mg pill twice per day for 7 days Trelegy one puff daily, and rinse mouth after each use Albuterol every 4 to 6 hours as needed for cough, wheeze, chest congestion, or shortness of breath Stop using pulmicort and atrovent in your nebulizer while using Trelegy  Follow up in 2 weeks - can be tele visit

## 2018-12-13 NOTE — Progress Notes (Addendum)
[facility]  Chief Complaint  Patient presents with  . COPD    URI per ER visit on 12/08/18. Was told to use rescue inhaler 2 hours instead of every 4 - 6. Also using all 3 nebulizer solution.    Constitutional:  BP 132/86 (BP Location: Right Arm, Patient Position: Sitting, Cuff Size: Normal)   Pulse 96   Temp 98.2 F (36.8 C)   Ht 5\' 3"  (1.6 m)   Wt 127 lb 12.8 oz (58 kg)   LMP 07/18/2016   SpO2 99% Comment: on room air  BMI 22.64 kg/m   Past Medical History:  CVA, PE, PAF, HTN, HLD, Hep B, GERD, Depression, Cirrhosis, Colon cancer, chronic pain, Anxiety  Brief Summary:  Kimberly Kemp is a 53 y.o. female smoker with COPD and emphysema.  She continues to smoke cigarettes - 4 on good days and 8 on bad days.  She was in ER recently.  Started on prednisone again.  Not given ABx.  Has cough, wheeze, chest congestion, and bringing up thick sputum.  Not having fever, skin rash, or leg swelling.   Physical Exam:   Appearance - well kempt   ENMT - clear nasal mucosa, midline nasal  septum, no oral exudates, no LAN, trachea midline  Respiratory - normal chest wall, normal respiratory effort, no accessory muscle use, no wheeze/rales  CV - s1s2 regular rate and rhythm, no murmurs, no peripheral edema, radial pulses symmetric  GI - soft, non tender, no masses  Lymph - no adenopathy noted in neck and axillary areas  MSK - normal gait  Ext - no cyanosis, clubbing, or joint inflammation noted  Skin - no rashes, lesions, or ulcers  Neuro - normal strength, oriented x 3  Psych - normal mood and affect   Assessment/Plan:   COPD with emphysema. - she has recurrent exacerbation - will try her on trelegy >> sample given - complete prednisone from ER - will give course of doxycycline - continue prn albuterol - hold use of pulmicort and atrovent for now while on trelegy  Tobacco abuse. - reviewed options to help with smoking  cessation   Patient Instructions  Finish course of prednisone from the ER Doxycycline 100 mg pill twice per day for 7 days Trelegy one puff daily, and rinse mouth after each use Albuterol every 4 to 6 hours as needed for cough, wheeze, chest congestion, or shortness of breath Stop using pulmicort and atrovent in your nebulizer while using Trelegy  Follow up in 2 weeks - can be tele visit   A total of  27 minutes were spent face to face with the patient and more than half of that time involved counseling or coordination of care.   Chesley Mires, MD [facility] Pager: [phone] 12/13/2018, 4:56 PM  Flow Sheet     Pulmonary tests:  A1AT 09/04/06 >> 236 Spirometry 10/26/09>>FEV1 2.59(88%), FEV1% 73 RAST 01/22/10 >> negative, IgE 11.9 Labs 01/22/10 >> ACE 46, RF < 20, ANA negative, ANCA negative PFT 07/14/18 >> FEV1 2.08 (77%), FEV1% 65, TLC 4.11 (83%), DLCO 70%  Chest imaging:  CT chest 06/26/10 >> upper lobe emphysema CT chest 06/10/13 >> minimal centrilobular emphysema CT chest 07/13/18 >> moderate to advanced emphysema  Sleep tests:  HST 09/13/10 >> AHI 1, SpO2 low 91%  Cardiac tests:  Echo 04/15/06 >> EF 55 to 65%  Medications:   Allergies as of 12/13/2018      Reactions  Penicillins Hives   Has patient had a PCN reaction causing immediate rash, facial/tongue/throat swelling, SOB or lightheadedness with hypotension: Yes Has patient had a PCN reaction causing severe rash involving mucus membranes or skin necrosis: No Has patient had a PCN reaction that required hospitalization No Has patient had a PCN reaction occurring within the last 10 years: No If all of the above answers are "NO", then may proceed with Cephalosporin use.   Omnipaque [iohexol] Itching      Red Dye Itching      Medication List       Accurate as of December 13, 2018  4:56 PM. If you have any questions, ask your nurse or doctor.        STOP taking these medications    budesonide 0.25 MG/2ML nebulizer solution Commonly known as: PULMICORT Stopped by: Chesley Mires, MD   ipratropium 0.02 % nebulizer solution Commonly known as: ATROVENT Stopped by: Chesley Mires, MD     TAKE these medications   albuterol 108 (90 Base) MCG/ACT inhaler Commonly known as: ProAir HFA INHALE 2 PUFFS INTO THE LUNGS EVERY FOUR HOURS AS NEEDED FOR WHEEZING What changed:   how much to take  how to take this  when to take this  reasons to take this  additional instructions   albuterol (2.5 MG/3ML) 0.083% nebulizer solution Commonly known as: PROVENTIL TAKE 3 MLS (2.5 MG TOTAL) BY NEBULIZATION 4 TIMES DAILY AS NEEDED FOR WHEEZING OR SHORTNESS OF BREATH What changed: Another medication with the same name was changed. Make sure you understand how and when to take each.   aspirin EC 81 MG tablet Take 81 mg by mouth every morning.   atorvastatin 80 MG tablet Commonly known as: LIPITOR Take 80 mg by mouth at bedtime.   benzonatate 100 MG capsule Commonly known as: Tessalon Perles Take 1 capsule (100 mg total) by mouth every 6 (six) hours as needed.   butalbital-acetaminophen-caffeine 50-325-40 MG tablet Commonly known as: FIORICET Take 1 tablet by mouth 3 (three) times daily as needed for headache.   dextromethorphan-guaiFENesin 30-600 MG 12hr tablet Commonly known as: MUCINEX DM Take 1 tablet by mouth every 12 (twelve) hours.   diltiazem 120 MG 24 hr capsule Commonly known as: CARDIZEM CD Take 1 capsule (120 mg total) by mouth daily.   doxycycline 100 MG tablet Commonly known as: VIBRA-TABS Take 1 tablet (100 mg total) by mouth 2 (two) times daily. Started by: Chesley Mires, MD   DULoxetine 20 MG capsule Commonly known as: CYMBALTA TAKE ONE CAPSULE BY MOUTH EVERY DAY   gabapentin 300 MG capsule Commonly known as: NEURONTIN Take 300 mg by mouth at bedtime.   hydrOXYzine 50 MG capsule Commonly known as: VISTARIL Take 50 mg by mouth 2 (two) times daily as  needed for anxiety.   magic mouthwash w/lidocaine Soln Take 10 mLs by mouth 4 (four) times daily as needed for mouth pain.   meloxicam 15 MG tablet Commonly known as: MOBIC Take 1 tablet (15 mg total) by mouth daily.   omeprazole 40 MG capsule Commonly known as: PRILOSEC TAKE 1 CAPSULE BY MOUTH EVERY DAY What changed:   how much to take  how to take this  when to take this  additional instructions   ondansetron 4 MG tablet Commonly known as: ZOFRAN Take 4 mg by mouth 3 (three) times daily.   Pazeo 0.7 % Soln Generic drug: Olopatadine HCl Place 1 drop into both eyes daily.   predniSONE 50 MG tablet Commonly  known as: DELTASONE Take 1 tablet (50 mg total) by mouth daily with breakfast.   PROBIOTIC-10 PO Take 1 tablet by mouth daily.   promethazine 25 MG tablet Commonly known as: PHENERGAN Take 25 mg by mouth 4 (four) times daily.   promethazine-codeine 6.25-10 MG/5ML syrup Commonly known as: PHENERGAN with CODEINE Take 5 mLs by mouth every 6 (six) hours as needed for cough.   Trelegy Ellipta 100-62.5-25 MCG/INH Aepb Generic drug: Fluticasone-Umeclidin-Vilant Inhale 1 puff into the lungs daily. Started by: Chesley Mires, MD       Past Surgical History:  She  has a past surgical history that includes Bronchoscopy (March 2008, Feb 2012); Cesarean section; and Appendectomy.  Family History:  Her family history includes Diabetes in her mother; Emphysema in her father and maternal grandmother; Heart disease (age of onset: 62) in her mother; Liver disease in her father; Prostate cancer in her father.  Social History:  She  reports that she has been smoking cigarettes. She has a 21.50 pack-year smoking history. She has never used smokeless tobacco. She reports that she does not drink alcohol or use drugs.

## 2018-12-13 NOTE — Addendum Note (Signed)
Addended by: Chesley Mires on: 12/13/2018 04:56 PM   Modules accepted: Orders

## 2019-01-03 ENCOUNTER — Ambulatory Visit: Payer: Medicare Other | Admitting: Pulmonary Disease

## 2019-01-05 ENCOUNTER — Other Ambulatory Visit: Payer: Self-pay

## 2019-01-05 ENCOUNTER — Ambulatory Visit (INDEPENDENT_AMBULATORY_CARE_PROVIDER_SITE_OTHER): Payer: Medicare Other | Admitting: Internal Medicine

## 2019-01-05 ENCOUNTER — Encounter: Payer: Self-pay | Admitting: Internal Medicine

## 2019-01-05 ENCOUNTER — Ambulatory Visit (INDEPENDENT_AMBULATORY_CARE_PROVIDER_SITE_OTHER): Payer: Medicare Other

## 2019-01-05 VITALS — BP 160/90 | HR 93 | Ht 63.0 in | Wt 127.5 lb

## 2019-01-05 DIAGNOSIS — R002 Palpitations: Secondary | ICD-10-CM

## 2019-01-05 DIAGNOSIS — R079 Chest pain, unspecified: Secondary | ICD-10-CM

## 2019-01-05 DIAGNOSIS — E785 Hyperlipidemia, unspecified: Secondary | ICD-10-CM | POA: Diagnosis not present

## 2019-01-05 DIAGNOSIS — R0602 Shortness of breath: Secondary | ICD-10-CM | POA: Diagnosis not present

## 2019-01-05 NOTE — Progress Notes (Signed)
Follow-up Outpatient Visit Date: 01/05/2019  Primary Care Provider: Martin Majestic, FNP [place] [id number]  Chief Complaint: Follow-up chest pain, shortness of breath, and palpitations  HPI:  [name] is a 53 y.o. female with history of hypertension, hyperlipidemia, stroke, questionable paroxysmal atrial fibrillation, questionable pulmonary embolism, COPD, colon cancer, hepatitis B, anxiety, and tobacco use, who presents for follow-up of chest pain and shortness of breath.  I last saw Kimberly Kemp in late October, which time she reported stable chronic shortness of breath as well as intermittent chest pain.  She also complained of almost daily palpitations associated with dizziness.  Chest pain was felt to be atypical; diltiazem was started in case there is an element of coronary vasospasm as well as to treat her palpitations.  Given significant LDL elevation (222) in spite of taking atorvastatin 80 mg daily, Kimberly Kemp was referred to the lipid clinic to discuss further treatment options.  She is scheduled to see Dr. Debara Pickett next month.  Of note, chest CT from 07/2018 showed no significant coronary artery calcification.  Today, Kimberly Kemp reports that she has been under increased stress the last week.  Her sister became acutely ill and died of multiorgan failure in the setting of sepsis this past weekend.  Leading up to this Kimberly Kemp reports that she was feeling about the same as when we last spoke.  She continued to have intermittent chest pain that is not exertional as well as palpitations that often accompany the chest pain.  Exertional dyspnea is also unchanged.  She took only one dose of diltiazem, as it made her blood pressure "bottom out."  It also made her feel very weak with the sensation that her chest was caving in.  She continues to have the aforementioned chest pain and palpitations most days of the week.  The episodes are self-limited and last a few minutes.  Kimberly Kemp  notes continued dependent edema in both legs.  It improves some with elevation.  She has not tried wearing compression stockings.  She is trying to quit smoking.  --------------------------------------------------------------------------------------------------  Cardiovascular History & Procedures: Cardiovascular Problems:  Paroxysmal atrial fibrillation (questionable; no objective documentation in the patient's chart)  Pulmonary embolism (questionable; no objective documentation in the patient's chart)  Shortness of breath  Risk Factors:  Prior stroke, hypertension, hyperlipidemia, and tobacco use  Cath/PCI:  None  CV Surgery:  None  EP Procedures and Devices:  None  Non-Invasive Evaluation(s):  Dobutamine stress echocardiogram (11/16/2018): Low risk study with normal baseline LVEF and no evidence of inducible wall motion abnormality.  LVEF 55-60%.  Exercise tolerance test (09/20/2018): Low to intermediate risk study with decreased exercise capacity and significant motion artifact.  Recent CV Pertinent Labs: Lab Results  Component Value Date   CHOL 305 (H) 09/22/2015   HDL 83 09/22/2015   LDLCALC 174 (H) 09/22/2015   TRIG 241 (H) 09/22/2015   CHOLHDL 3.7 09/22/2015   K 4.0 05/06/2018   BUN 17 05/06/2018   CREATININE 0.80 07/13/2018    Past medical and surgical history were reviewed and updated in EPIC.  Current Meds  Medication Sig  . albuterol (PROAIR HFA) 108 (90 Base) MCG/ACT inhaler INHALE 2 PUFFS INTO THE LUNGS EVERY FOUR HOURS AS NEEDED FOR WHEEZING (Patient taking differently: Inhale 2 puffs into the lungs every 4 (four) hours as needed for wheezing or shortness of breath. )  . albuterol (PROVENTIL) (2.5 MG/3ML) 0.083% nebulizer solution TAKE 3 MLS (2.5 MG  TOTAL) BY NEBULIZATION 4 TIMES DAILY AS NEEDED FOR WHEEZING OR SHORTNESS OF BREATH  . aspirin EC 81 MG tablet Take 81 mg by mouth every morning.  Marland Kitchen atorvastatin (LIPITOR) 80 MG tablet Take 80 mg by  mouth at bedtime.  . benzonatate (TESSALON PERLES) 100 MG capsule Take 1 capsule (100 mg total) by mouth every 6 (six) hours as needed.  . butalbital-acetaminophen-caffeine (FIORICET, ESGIC) 50-325-40 MG tablet Take 1 tablet by mouth 3 (three) times daily as needed for headache.  . dextromethorphan-guaiFENesin (MUCINEX DM) 30-600 MG per 12 hr tablet Take 1 tablet by mouth every 12 (twelve) hours.    . DULoxetine (CYMBALTA) 20 MG capsule TAKE ONE CAPSULE BY MOUTH EVERY DAY (Patient taking differently: Take 20 mg by mouth daily. )  . gabapentin (NEURONTIN) 300 MG capsule Take 300 mg by mouth at bedtime.  . hydrOXYzine (VISTARIL) 50 MG capsule Take 50 mg by mouth 2 (two) times daily as needed for anxiety.  . magic mouthwash w/lidocaine SOLN Take 10 mLs by mouth 4 (four) times daily as needed for mouth pain.  . meloxicam (MOBIC) 15 MG tablet Take 1 tablet (15 mg total) by mouth daily.  Marland Kitchen omeprazole (PRILOSEC) 40 MG capsule TAKE 1 CAPSULE BY MOUTH EVERY DAY (Patient taking differently: Take 40 mg by mouth daily. )  . ondansetron (ZOFRAN) 4 MG tablet Take 4 mg by mouth 3 (three) times daily.  Marland Kitchen PAZEO 0.7 % SOLN Place 1 drop into both eyes daily.  . Probiotic Product (PROBIOTIC-10 PO) Take 1 tablet by mouth daily.  . promethazine (PHENERGAN) 25 MG tablet Take 25 mg by mouth 4 (four) times daily.  . promethazine-codeine (PHENERGAN WITH CODEINE) 6.25-10 MG/5ML syrup Take 5 mLs by mouth every 6 (six) hours as needed for cough.  . [DISCONTINUED] diltiazem (CARDIZEM CD) 120 MG 24 hr capsule Take 1 capsule (120 mg total) by mouth daily.    Allergies: Penicillins, Omnipaque [iohexol], and Red dye  Social History   Tobacco Use  . Smoking status: Current Every Day Smoker    Packs/day: 0.50    Years: 43.00    Pack years: 21.50    Types: Cigarettes  . Smokeless tobacco: Never Used  . Tobacco comment: cutting back  Substance Use Topics  . Alcohol use: No    Alcohol/week: 0.0 standard drinks  . Drug  use: No    Family History  Problem Relation Age of Onset  . Emphysema Father   . Prostate cancer Father   . Liver disease Father   . Emphysema Maternal Grandmother   . Diabetes Mother   . Heart disease Mother 2       CABG and valve replacement  . Colon cancer Neg Hx     Review of Systems: A 12-system review of systems was performed and was negative except as noted in the HPI.  --------------------------------------------------------------------------------------------------  Physical Exam: BP (!) 160/90 (BP Location: Left Arm, Patient Position: Sitting, Cuff Size: Normal)   Pulse 93   Ht 5\' 3"  (1.6 m)   Wt 127 lb 8 oz (57.8 kg)   LMP 07/18/2016   SpO2 98%   BMI 22.59 kg/m   General:  NAD. HEENT: No conjunctival pallor or scleral icterus. Facemask in place. Neck: Supple without lymphadenopathy, thyromegaly, JVD, or HJR. Lungs: Normal work of breathing. Clear to auscultation bilaterally without wheezes or crackles. Heart: Regular rate and rhythm without murmurs, rubs, or gallops. Non-displaced PMI. Abd: Bowel sounds present. Soft, NT/ND without hepatosplenomegaly Ext: 1+ pretibial edema  bilaterarlly. Skin: Warm and dry without rash.  EKG:  Normal sinus rhythm with right atrial enlargement.  No significant change since prior tracing on 12/07/2008.  Lab Results  Component Value Date   WBC 8.2 05/06/2018   HGB 13.4 05/06/2018   HCT 38.4 05/06/2018   MCV 89.1 05/06/2018   PLT 250 05/06/2018    Lab Results  Component Value Date   NA 137 05/06/2018   K 4.0 05/06/2018   CL 105 05/06/2018   CO2 21 (L) 05/06/2018   BUN 17 05/06/2018   CREATININE 0.80 07/13/2018   GLUCOSE 104 (H) 05/06/2018   ALT 25 05/06/2018    Lab Results  Component Value Date   CHOL 305 (H) 09/22/2015   HDL 83 09/22/2015   LDLCALC 174 (H) 09/22/2015   TRIG 241 (H) 09/22/2015   CHOLHDL 3.7 09/22/2015     --------------------------------------------------------------------------------------------------  ASSESSMENT AND PLAN: Chest pain, shortness of breath, and palpitations: Symptoms continue to occur daily.  Given intolerance to diltiazem, we have agreed to obtain a 14-day event monitor for further evaluation before trying other medications.  If event monitor is unrevealing and symptoms persist, we may need to consider right and left heart catheterization.  Hyperlipidemia: LDL remains quite elevated in spite of high-intensity statin therapy.  Given history of stroke, our goal LDL is < 70.  Lipid clinic appointment with Dr. Debara Pickett is scheduled for next month.  We will continue atorvastatin 80 mg daily in the meantime.  Follow-up: Return to clinic in 1 month.  [name], MD 01/06/2019 2:29 PM

## 2019-01-05 NOTE — Patient Instructions (Signed)
Medication Instructions:  Your physician recommends that you continue on your current medications as directed. Please refer to the Current Medication list given to you today.  *If you need a refill on your cardiac medications before your next appointment, please call your pharmacy*  Lab Work: none If you have labs (blood work) drawn today and your tests are completely normal, you will receive your results only by: Marland Kitchen MyChart Message (if you have MyChart) OR . A paper copy in the mail If you have any lab test that is abnormal or we need to change your treatment, we will call you to review the results.  Testing/Procedures: Your physician has recommended that you wear an 14 DAY ZIO event monitor. Event monitors are medical devices that record the heart's electrical activity. Doctors most often [place] these monitors to diagnose arrhythmias. Arrhythmias are problems with the speed or rhythm of the heartbeat. The monitor is a small, portable device. You can wear one while you do your normal daily activities. This is usually used to diagnose what is causing palpitations/syncope (passing out). A Zio Patch Event Heart monitor will be applied to your chest today.  You will wear the patch for 14 days. After 24 hours, you may shower with the heart monitor on. If you feel any SYMPTOMS, you may press and release the button in the middle of the monitor.  Follow-Up: At [facility], you and your health needs are our priority.  As part of our continuing mission to provide you with exceptional heart care, we have created designated Provider Care Teams.  These Care Teams include your primary Cardiologist (physician) and Advanced Practice Providers (APPs -  Physician Assistants and Nurse Practitioners) who all work together to provide you with the care you need, when you need it.  Your next appointment:   1 month(s)  The format for your next appointment:   In Person  Provider:    You may see DR Harrell Gave  END or one of the following Advanced Practice Providers on your designated Care Team:    Murray Hodgkins, NP  Christell Faith, PA-C  Marrianne Mood, PA-C

## 2019-01-06 ENCOUNTER — Encounter: Payer: Self-pay | Admitting: Internal Medicine

## 2019-01-07 ENCOUNTER — Other Ambulatory Visit: Payer: Self-pay | Admitting: General Surgery

## 2019-01-07 ENCOUNTER — Other Ambulatory Visit: Payer: Self-pay | Admitting: Pulmonary Disease

## 2019-01-07 DIAGNOSIS — J432 Centrilobular emphysema: Secondary | ICD-10-CM

## 2019-01-07 DIAGNOSIS — J441 Chronic obstructive pulmonary disease with (acute) exacerbation: Secondary | ICD-10-CM

## 2019-01-07 MED ORDER — ALBUTEROL SULFATE (2.5 MG/3ML) 0.083% IN NEBU: INHALATION_SOLUTION | RESPIRATORY_TRACT | 3 refills | Status: DC

## 2019-01-17 ENCOUNTER — Telehealth: Payer: Self-pay | Admitting: Internal Medicine

## 2019-01-17 NOTE — Telephone Encounter (Signed)
NO answer. No VM.

## 2019-01-17 NOTE — Telephone Encounter (Signed)
Patient calling in to clarify if she is needing lab work prior to 1 month fu in January. Please advise when able

## 2019-01-18 ENCOUNTER — Ambulatory Visit: Payer: Medicare Other | Admitting: Pulmonary Disease

## 2019-01-18 ENCOUNTER — Other Ambulatory Visit: Payer: Self-pay

## 2019-01-18 DIAGNOSIS — J449 Chronic obstructive pulmonary disease, unspecified: Secondary | ICD-10-CM | POA: Diagnosis not present

## 2019-01-19 ENCOUNTER — Ambulatory Visit (INDEPENDENT_AMBULATORY_CARE_PROVIDER_SITE_OTHER): Payer: Medicare Other | Admitting: Licensed Clinical Social Worker

## 2019-01-19 ENCOUNTER — Other Ambulatory Visit: Payer: Self-pay

## 2019-01-19 DIAGNOSIS — F4323 Adjustment disorder with mixed anxiety and depressed mood: Secondary | ICD-10-CM | POA: Diagnosis not present

## 2019-01-19 DIAGNOSIS — R002 Palpitations: Secondary | ICD-10-CM | POA: Diagnosis not present

## 2019-01-19 DIAGNOSIS — F411 Generalized anxiety disorder: Secondary | ICD-10-CM | POA: Diagnosis not present
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
## 2019-01-25 ENCOUNTER — Other Ambulatory Visit: Payer: Self-pay

## 2019-01-25 ENCOUNTER — Ambulatory Visit (INDEPENDENT_AMBULATORY_CARE_PROVIDER_SITE_OTHER): Payer: Medicare Other | Admitting: Licensed Clinical Social Worker

## 2019-01-25 DIAGNOSIS — F411 Generalized anxiety disorder: Secondary | ICD-10-CM

## 2019-01-25 DIAGNOSIS — F4323 Adjustment disorder with mixed anxiety and depressed mood: Secondary | ICD-10-CM

## 2019-01-25 NOTE — Progress Notes (Signed)
Virtual Visit via Telephone Note   I connected with Sudiksha Rippy on 01/25/19 at 10:00am by telephone and verified that I am speaking with the correct person using two identifiers.   I discussed the limitations, risks, security and privacy concerns of performing an evaluation and management service by telephone and the availability of in person appointments. I also discussed with the patient that there may be a patient responsible charge related to this service. The patient expressed understanding and agreed to proceed.   I discussed the assessment and treatment plan with the patient. The patient was provided an opportunity to ask questions and all were answered. The patient agreed with the plan and demonstrated an understanding of the instructions.   The patient was advised to call back or seek an in-person evaluation if the symptoms worsen or if the condition fails to improve as anticipated.   I provided 45 minutes of non-face-to-face time during this encounter.     Shade Flood, LCSW, LCASA ________________________ THERAPIST PROGRESS NOTE  Session Time: 10:00am - 10:45am   Participation Level: Active   Behavioral Response: Alert, anxious, depressed   Type of Therapy:  Individual Therapy  Treatment Goals addressed: Depression and anxiety reduction, grieving   Interventions: CBT  Summary: Gessica presented for telephone therapy session today and spoke in a manner that was alert, oriented x5, with no evidence or self-report of SI/HI or A/V H.  Sya reported that things have been "So so" today and explained that her recently deceased sister's birthday is on the 24th, and she has been thinking about her every day, which has led to emotional ratings of 9/10 for depression and anxiety.  Caitlin reported that she has attempted to preoccupy herself with staying busy each day by reading, cleaning up around the house, but thoughts of her sister cross her mind regularly.  Amri was responsive to questions  regarding her sister and explained that she feels guilty about what happened to her sister, since she thinks she could have intervened somehow and gotten her to the hospital sooner so that she wouldn't have died.  Through conversation about the subject, Joellyn eventually acknowledged that her sister went to the doctor regularly, so she would have known about underlying medical conditions, and likely hid it from [place], since she wouldn't have wanted to burden her, and was still dealing with pain from the loss of her daughter.  Liticia stated "I think my sister wanted to be a peace from the pain.  She wanted to see her daughter again".  Curissa reported that she will try to think of her sister being at peace now instead of in the hospital, stating "I do believe in god, and I'm hoping and praying she is with her daughter again finally, and happy".  Brenisha reported that her mood was better after this conversation and her plan this week will be to increase openness about her feelings rather than running from them so that she can continue healing process, as well as spend time with her children over the holiday having fun, laughing, and enjoying each others company. She reported that she would follow up in one week.    Suicidal/Homicidal: None, without plan or intent   Therapist Response: Clinician contacted Jackaline for telephone therapy session today.  Clinician assessed for safety.  Clinician inquired about Aleeah's present emotional ratings, as well as any significant changes in her thoughts, feelings, and behaviors since last conversation.  Clincian encouraged Lashea to openly discuss her current thoughts and feelings  regarding her sister's passing to identify where she is currently at in the grieving process, and explained the stages one moves through when dealing with loss.  Clinician also utilzied socratic questioning to assist with cognitive restructuring so that Elexius's irrational thoughts could be identified and replaced with  more rational thinking that would facilitate healthier transition through grieving process. Clinician praised Liron for showing willingness to view recent tragedy through a different lens and encouraged her to continue challenging irrational thoughts as they arise each day, and stay around supportive family members over the holiday.  Clinician will continue to monitor.      Plan: Follow up in 1 week virtually.    Diagnosis: Adjustment disorder with mixed anxiety and depressed mood; Generalized Anxiety disorder   Shade Flood, [place], LCASA 01/25/19

## 2019-01-26 DIAGNOSIS — R002 Palpitations: Secondary | ICD-10-CM | POA: Diagnosis not present

## 2019-02-03 ENCOUNTER — Other Ambulatory Visit: Payer: Self-pay | Admitting: Pulmonary Disease

## 2019-02-03 ENCOUNTER — Telehealth: Payer: Self-pay | Admitting: Pulmonary Disease

## 2019-02-03 NOTE — Telephone Encounter (Signed)
Attempted to call patient and no answer and VM box is full. Albuterol nebulizer solution was sent to her pharmacy on 01/07/19 with 3 refills. Nothing further is needed.

## 2019-02-09 ENCOUNTER — Encounter: Payer: Self-pay | Admitting: Internal Medicine

## 2019-02-09 ENCOUNTER — Other Ambulatory Visit: Payer: Self-pay

## 2019-02-09 ENCOUNTER — Ambulatory Visit (INDEPENDENT_AMBULATORY_CARE_PROVIDER_SITE_OTHER): Payer: Medicare Other | Admitting: Internal Medicine

## 2019-02-09 VITALS — BP 153/83 | HR 88 | Ht 63.0 in | Wt 129.6 lb

## 2019-02-09 DIAGNOSIS — R079 Chest pain, unspecified: Secondary | ICD-10-CM | POA: Diagnosis not present

## 2019-02-09 DIAGNOSIS — E7801 Familial hypercholesterolemia: Secondary | ICD-10-CM | POA: Diagnosis not present

## 2019-02-09 DIAGNOSIS — R002 Palpitations: Secondary | ICD-10-CM

## 2019-02-09 MED ORDER — ROSUVASTATIN CALCIUM 40 MG PO TABS: 40.0000 mg | ORAL_TABLET | Freq: Every day | ORAL | 3 refills | Status: DC

## 2019-02-09 NOTE — Patient Instructions (Signed)
Medication Instructions:  STOP ATORVASTATIN  START ROSUVASTATIN 40 MG ONCE DAILY *If you need a refill on your cardiac medications before your next appointment, please call your pharmacy*  Lab Work: Your physician recommends that you return for lab work in: [place] If you have labs (blood work) drawn today and your tests are completely normal, you will receive your results only by: Marland Kitchen MyChart Message (if you have MyChart) OR . A paper copy in the mail If you have any lab test that is abnormal or we need to change your treatment, we will call you to review the results.  Follow-Up: At [facility], you and your health needs are our priority.  As part of our continuing mission to provide you with exceptional heart care, we have created designated Provider Care Teams.  These Care Teams include your primary Cardiologist (physician) and Advanced Practice Providers (APPs -  Physician Assistants and Nurse Practitioners) who all work together to provide you with the care you need, when you need it.  Your next appointment:   3 month(s)  The format for your next appointment:   Either In Person or Virtual  Provider:   Raliegh Ip Mali Hilty, MD

## 2019-02-09 NOTE — Progress Notes (Signed)
LIPID CLINIC CONSULT NOTE  Chief Complaint:  Evaluate dyslipidemia  Primary Care Physician: Martin Majestic, FNP  Primary Cardiologist:  No primary care provider on file.  HPI:  Kimberly Kemp is a 54 y.o. female who is being seen today for the evaluation of dyslipidemia at the request of End, Harrell Gave, MD.  This is a pleasant 54 year old female kindly referred by Dr. Delane Ginger for evaluation management of dyslipidemia.  There is concern for familial hyperlipidemia.  She has a longstanding history of significantly elevated cholesterol in the 3-4 100s.  Most recently a lipid profile in October 2020 showed total cholesterol 298, triglycerides 88, HDL 58 and LDL of 222.  This is on therapy with atorvastatin 80 mg daily, indicating that her LDL is likely more than 50% higher than that without treatment.  This is highly concerning for familial hyperlipidemia.  In addition her mother had coronary disease and had a bypass in her 62s and ultimately died of an MI.  She also has other siblings and 4 children.  She is been struggling on and off with chest pain which is been evaluated by Dr. Saunders Revel.  Was felt to be somewhat atypical.  He mentioned that she had no significant coronary calcifications however her chest CT in June 2020 did comment "suspect scattered coronary artery calcifications".  I personally reviewed the CT scan and agree there are likely scattered coronary artery calcifications.  She has some persistent chest pain symptoms.  In addition, she feels like she may be having side effects related to atorvastatin.  She reports some tachycardia and palpitations but no significant myalgias.  Although this is atypical, given her significantly elevated lipid profile, further coronary evaluation may be warranted.  PMHx:  Past Medical History:  Diagnosis Date  . Anxiety   . Asthma   . Cancer ([place])    colon ca   . Chronic pain   . Cirrhosis ([place])   . COPD (chronic obstructive pulmonary disease)  ([place])   . Depression   . Emphysema   . GERD (gastroesophageal reflux disease)   . Heart murmur   . Hepatitis B   . Hyperlipidemia   . Hypertension   . Paroxysmal atrial fibrillation ([facility])   . Pulmonary embolism ([place])   . Stroke ([place])   . Tobacco abuse     Past Surgical History:  Procedure Laterality Date  . APPENDECTOMY     microscopic   . BRONCHOSCOPY  March 2008, Feb 2012  . CESAREAN SECTION      FAMHx:  Family History  Problem Relation Age of Onset  . Emphysema Father   . Prostate cancer Father   . Liver disease Father   . Emphysema Maternal Grandmother   . Diabetes Mother   . Heart disease Mother 35       CABG and valve replacement  . Colon cancer Neg Hx     SOCHx:   reports that she has been smoking cigarettes. She has a 21.50 pack-year smoking history. She has never used smokeless tobacco. She reports that she does not drink alcohol or use drugs.  ALLERGIES:  Allergies  Allergen Reactions  . Penicillins Hives    Has patient had a PCN reaction causing immediate rash, facial/tongue/throat swelling, SOB or lightheadedness with hypotension: Yes Has patient had a PCN reaction causing severe rash involving mucus membranes or skin necrosis: No Has patient had a PCN reaction that required hospitalization No Has patient had a PCN reaction occurring within the last 10  years: No If all of the above answers are "NO", then may proceed with Cephalosporin use.   . Omnipaque [Iohexol] Itching       . Red Dye Itching    ROS: Pertinent items noted in HPI and remainder of comprehensive ROS otherwise negative.  HOME MEDS: Current Outpatient Medications on File Prior to Visit  Medication Sig Dispense Refill  . albuterol (PROAIR HFA) 108 (90 Base) MCG/ACT inhaler INHALE 2 PUFFS INTO THE LUNGS EVERY FOUR HOURS AS NEEDED FOR WHEEZING (Patient taking differently: Inhale 2 puffs into the lungs every 4 (four) hours as needed for wheezing or shortness of breath. ) 8.5 Inhaler 5    . albuterol (PROVENTIL) (2.5 MG/3ML) 0.083% nebulizer solution TAKE 3 MLS (2.5 MG TOTAL) BY NEBULIZATION 4 TIMES DAILY AS NEEDED FOR WHEEZING OR SHORTNESS OF BREATH 375 mL 3  . aspirin EC 81 MG tablet Take 81 mg by mouth every morning.    Marland Kitchen atorvastatin (LIPITOR) 80 MG tablet Take 80 mg by mouth at bedtime.  5  . benzonatate (TESSALON PERLES) 100 MG capsule Take 1 capsule (100 mg total) by mouth every 6 (six) hours as needed. 30 capsule 0  . budesonide (PULMICORT) 0.25 MG/2ML nebulizer solution TAKE 2 MLS (0.25 MG TOTAL) BY NEBULIZATION 2 (TWO) TIMES DAILY. 360 mL 1  . butalbital-acetaminophen-caffeine (FIORICET, ESGIC) 50-325-40 MG tablet Take 1 tablet by mouth 3 (three) times daily as needed for headache.  2  . dextromethorphan-guaiFENesin (MUCINEX DM) 30-600 MG per 12 hr tablet Take 1 tablet by mouth every 12 (twelve) hours.      . DULoxetine (CYMBALTA) 20 MG capsule TAKE ONE CAPSULE BY MOUTH EVERY DAY (Patient taking differently: Take 20 mg by mouth daily. ) 30 capsule 3  . gabapentin (NEURONTIN) 300 MG capsule Take 300 mg by mouth at bedtime.  1  . hydrOXYzine (VISTARIL) 50 MG capsule Take 50 mg by mouth 2 (two) times daily as needed for anxiety.  2  . magic mouthwash w/lidocaine SOLN Take 10 mLs by mouth 4 (four) times daily as needed for mouth pain. 120 mL 0  . meloxicam (MOBIC) 15 MG tablet Take 1 tablet (15 mg total) by mouth daily. 30 tablet 0  . omeprazole (PRILOSEC) 40 MG capsule TAKE 1 CAPSULE BY MOUTH EVERY DAY (Patient taking differently: Take 40 mg by mouth daily. ) 30 capsule 0  . ondansetron (ZOFRAN) 4 MG tablet Take 4 mg by mouth 3 (three) times daily.  0  . PAZEO 0.7 % SOLN Place 1 drop into both eyes daily.    . Probiotic Product (PROBIOTIC-10 PO) Take 1 tablet by mouth daily.    . promethazine (PHENERGAN) 25 MG tablet Take 25 mg by mouth 4 (four) times daily.  2  . promethazine-codeine (PHENERGAN WITH CODEINE) 6.25-10 MG/5ML syrup Take 5 mLs by mouth every 6 (six) hours as  needed for cough. 120 mL 0   No current facility-administered medications on file prior to visit.    LABS/IMAGING: No results found for this or any previous visit (from the past 48 hour(s)). LONG TERM MONITOR (3-14 DAYS)  Result Date: 02/08/2019  The patient was monitored for 13 days, 20 hours.  The predominant rhythm was sinus with an average rate of 83 bpm (range 55-135 bpm in sinus).  There were rare PAC's and PVC's. Four atrial runs lasting up to 7 beats were observed with a maximum rate of 146 bpm.  No sustained arrhythmia or prolonged pause was noted.  Patient triggered events  correspond to sinus rhythm and artifact.  Predominantly sinus rhythm with rare PAC's and PVC's, as well as rare episodes of brief PSVT.  Patient triggered events correspond to sinus rhythm.    LIPID PANEL:    Component Value Date/Time   CHOL 305 (H) 09/22/2015 0347   TRIG 241 (H) 09/22/2015 0347   HDL 83 09/22/2015 0347   CHOLHDL 3.7 09/22/2015 0347   VLDL 48 (H) 09/22/2015 0347   LDLCALC 174 (H) 09/22/2015 0347    WEIGHTS: Wt Readings from Last 3 Encounters:  02/09/19 129 lb 9.6 oz (58.8 kg)  01/05/19 127 lb 8 oz (57.8 kg)  12/13/18 127 lb 12.8 oz (58 kg)    VITALS: BP (!) 153/83   Pulse 88   Ht 5\' 3"  (1.6 m)   Wt 129 lb 9.6 oz (58.8 kg)   LMP 07/18/2016   SpO2 100%   BMI 22.96 kg/m   EXAM: General appearance: alert and no distress Neck: no carotid bruit, no JVD and thyroid not enlarged, symmetric, no tenderness/mass/nodules Lungs: clear to auscultation bilaterally Heart: regular rate and rhythm, S1, S2 normal and systolic murmur: early systolic 2/6, blowing at apex Abdomen: soft, non-tender; bowel sounds normal; no masses,  no organomegaly Extremities: extremities normal, atraumatic, no cyanosis or edema Pulses: 2+ and symmetric Skin: Skin color, texture, turgor normal. No rashes or lesions Neurologic: Grossly normal Psych: Pleasant  EKG: Deferred  ASSESSMENT: 1. 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 Family history of premature coronary disease in her mother 3. Untreated LDL-C greater than 330 4. Possible intolerance to high potency atorvastatin 5. Scattered coronary artery calcification  PLAN: 1.   Ms. Gingras is a pleasant 54 year old female with likely familial hyperlipidemia and a high Dutch score.  She early onset family history of coronary disease with her mother who had bypass in her 47s and ultimately died of an MI.  Her LDL C untreated is greater than 330 and on high potency atorvastatin is at best now 222.  She may be having side effects from atorvastatin and I would like to switch her over to high potency rosuvastatin 40 mg daily.  In addition I think we should further pursue her chest pain symptoms.  Although they are atypical, she was noted to have scattered coronary calcification and it may be reasonable for her to undergo a CT coronary angiogram which would provide information regarding a true calcium score as well as whether or not there is any significant atherosclerosis.  She has an appointment with her cardiologist tomorrow and I will defer this testing to him.  Additionally, she will likely be a candidate for PCSK9 inhibitor and we will likely pursue this concomitantly with her change in statin.  Finally, given her family history of significant early onset coronary disease, high likelihood of familial hyperlipidemia, genetic testing is warranted and she will be referred to Dr. Broadus John.  Thanks again for the kind referral.  Follow-up with me in 3 to 4 months.  Pixie Casino, MD, [facility], [place] Director of the [facility] Lipid Disorders &  Cardiovascular Risk Reduction Clinic Diplomate of the American Board of Clinical Lipidology Attending Cardiologist  Direct Dial: [phone]  Fax: [phone]  Website:  [email or web address] 02/09/2019, 9:37 AM

## 2019-02-10 ENCOUNTER — Ambulatory Visit (INDEPENDENT_AMBULATORY_CARE_PROVIDER_SITE_OTHER): Payer: Medicare Other | Admitting: Internal Medicine

## 2019-02-10 ENCOUNTER — Encounter: Payer: Self-pay | Admitting: Internal Medicine

## 2019-02-10 VITALS — BP 160/64 | HR 80 | Ht 63.0 in | Wt 129.0 lb

## 2019-02-10 DIAGNOSIS — E7801 Familial hypercholesterolemia: Secondary | ICD-10-CM | POA: Diagnosis not present

## 2019-02-10 DIAGNOSIS — R079 Chest pain, unspecified: Secondary | ICD-10-CM | POA: Diagnosis not present

## 2019-02-10 DIAGNOSIS — R0602 Shortness of breath: Secondary | ICD-10-CM | POA: Diagnosis not present

## 2019-02-10 DIAGNOSIS — Z01810 Encounter for preprocedural cardiovascular examination: Secondary | ICD-10-CM

## 2019-02-10 NOTE — Progress Notes (Signed)
Follow-up Outpatient Visit Date: 02/10/2019  Primary Care Provider: Martin Majestic, FNP [place] [id number]  Chief Complaint: Follow-up chest pain and hyperlipidemia  HPI:  [name] is a 54 y.o. female with history of hypertension, hyperlipidemia, stroke, questionable paroxysmal atrial fibrillation, questionable pulmonary embolism, COPD, colon cancer, hepatitis B, anxiety, and tobacco use, who presents for follow-up of chest pain and shortness of breath.  I last saw her in early December, at which time Ms. [name] reported increasing stress after the death of her sister.  We had previously recommended using diltiazem for management of her chest pain and palpitations, though she did not tolerate this due to symptomatic hypotension and generalized weakness.  Subsequent 14-day event monitor showed no significant arrhythmia.  She was seen yesterday by Dr. Debara Pickett for evaluation and management of her familial hypercholesterolemia.  He switched her atorvastatin to rosuvastatin and raise the possibility of adding a PCSK9 inhibitor, though it does not look like further plans were made to explore this therapy.  Today, Ms. [name] reports that she feels about the same with intermittent chest pain, dyspnea, palpitations, and lightheadedness.  Episodes happen randomly and are not exertional.  She usually begins to notice lightheadedness following by sudden shortness of breath and chest tightness.  She then becomes aware of the sensation of her heart jumping.  Episodes usually last 5-10 minutes and are occurring several days per week.  There are stable in frequency and severity from prior visits.  She also endorses stable exertional dyspnea.  She denies edema.  --------------------------------------------------------------------------------------------------  Cardiovascular History & Procedures: Cardiovascular Problems:  Paroxysmal atrial fibrillation(questionable; no objective documentation  in the patient's chart)  Pulmonary embolism(questionable; no objective documentation in the patient's chart)  Shortness of breath  Risk Factors:  Prior stroke, hypertension, hyperlipidemia, and tobacco use  Cath/PCI:  None  CV Surgery:  None  EP Procedures and Devices:  14-day event monitor (01/05/2019): Predominantly sinus rhythm with rare PAC's and PVC's, as well as rare episodes of brief PSVT.  Patient triggered events correspond to sinus rhythm.  Non-Invasive Evaluation(s):  Dobutamine stress echocardiogram (11/16/2018): Low risk study with normal baseline LVEF and no evidence of inducible wall motion abnormality. LVEF 55-60%.  Exercise tolerance test (09/20/2018): Low to intermediate risk study with decreased exercise capacity and significant motion artifact.  Recent CV Pertinent Labs: Lab Results  Component Value Date   CHOL 305 (H) 09/22/2015   HDL 83 09/22/2015   LDLCALC 174 (H) 09/22/2015   TRIG 241 (H) 09/22/2015   CHOLHDL 3.7 09/22/2015   K 4.0 05/06/2018   BUN 17 05/06/2018   CREATININE 0.80 07/13/2018    Past medical and surgical history were reviewed and updated in EPIC.  Current Meds  Medication Sig  . albuterol (PROAIR HFA) 108 (90 Base) MCG/ACT inhaler INHALE 2 PUFFS INTO THE LUNGS EVERY FOUR HOURS AS NEEDED FOR WHEEZING (Patient taking differently: Inhale 2 puffs into the lungs every 4 (four) hours as needed for wheezing or shortness of breath. )  . albuterol (PROVENTIL) (2.5 MG/3ML) 0.083% nebulizer solution TAKE 3 MLS (2.5 MG TOTAL) BY NEBULIZATION 4 TIMES DAILY AS NEEDED FOR WHEEZING OR SHORTNESS OF BREATH  . aspirin EC 81 MG tablet Take 81 mg by mouth every morning.  . benzonatate (TESSALON PERLES) 100 MG capsule Take 1 capsule (100 mg total) by mouth every 6 (six) hours as needed.  . budesonide (PULMICORT) 0.25 MG/2ML nebulizer solution TAKE 2 MLS (0.25 MG TOTAL) BY NEBULIZATION 2 (TWO)  TIMES DAILY.  . butalbital-acetaminophen-caffeine  (FIORICET, ESGIC) 50-325-40 MG tablet Take 1 tablet by mouth 3 (three) times daily as needed for headache.  . dextromethorphan-guaiFENesin (MUCINEX DM) 30-600 MG per 12 hr tablet Take 1 tablet by mouth every 12 (twelve) hours.    . DULoxetine (CYMBALTA) 20 MG capsule TAKE ONE CAPSULE BY MOUTH EVERY DAY (Patient taking differently: Take 20 mg by mouth daily. )  . gabapentin (NEURONTIN) 300 MG capsule Take 300 mg by mouth at bedtime.  . hydrOXYzine (VISTARIL) 50 MG capsule Take 50 mg by mouth 2 (two) times daily as needed for anxiety.  . magic mouthwash w/lidocaine SOLN Take 10 mLs by mouth 4 (four) times daily as needed for mouth pain.  . meloxicam (MOBIC) 15 MG tablet Take 1 tablet (15 mg total) by mouth daily.  Marland Kitchen omeprazole (PRILOSEC) 40 MG capsule TAKE 1 CAPSULE BY MOUTH EVERY DAY (Patient taking differently: Take 40 mg by mouth daily. )  . ondansetron (ZOFRAN) 4 MG tablet Take 4 mg by mouth 3 (three) times daily.  Marland Kitchen PAZEO 0.7 % SOLN Place 1 drop into both eyes daily.  . Probiotic Product (PROBIOTIC-10 PO) Take 1 tablet by mouth daily.  . promethazine (PHENERGAN) 25 MG tablet Take 25 mg by mouth 4 (four) times daily.  . promethazine-codeine (PHENERGAN WITH CODEINE) 6.25-10 MG/5ML syrup Take 5 mLs by mouth every 6 (six) hours as needed for cough.  . rosuvastatin (CRESTOR) 40 MG tablet Take 1 tablet (40 mg total) by mouth daily.    Allergies: Penicillins, Omnipaque [iohexol], and Red dye  Social History   Tobacco Use  . Smoking status: Current Every Day Smoker    Packs/day: 0.50    Years: 43.00    Pack years: 21.50    Types: Cigarettes  . Smokeless tobacco: Never Used  . Tobacco comment: cutting back  Substance Use Topics  . Alcohol use: No    Alcohol/week: 0.0 standard drinks  . Drug use: No    Family History  Problem Relation Age of Onset  . Emphysema Father   . Prostate cancer Father   . Liver disease Father   . Emphysema Maternal Grandmother   . Diabetes Mother   . Heart  disease Mother 37       CABG and valve replacement  . Colon cancer Neg Hx     Review of Systems: A 12-system review of systems was performed and was negative except as noted in the HPI.  --------------------------------------------------------------------------------------------------  Physical Exam: BP (!) 160/64 (BP Location: Left Arm, Patient Position: Sitting, Cuff Size: Normal)   Pulse 80   Ht 5\' 3"  (1.6 m)   Wt 129 lb (58.5 kg)   LMP 07/18/2016   SpO2 98%   BMI 22.85 kg/m   General:  NAD. HEENT: No conjunctival pallor or scleral icterus. Facemask in place. Neck: Supple without lymphadenopathy, thyromegaly, JVD, or HJR. Lungs: Normal work of breathing. Coarse breath sounds without wheezes or crackles. Heart: Regular rate and rhythm without murmurs, rubs, or gallops. Non-displaced PMI. Abd: Bowel sounds present. Soft, NT/ND without hepatosplenomegaly Ext: No lower extremity edema. Radial, PT, and DP pulses are 2+ bilaterally. Skin: Warm and dry without rash.  EKG:  Normal sinus rhythm with possible left atria enlargement.  Otherwise, no significant abnormality.  Lab Results  Component Value Date   WBC 8.2 05/06/2018   HGB 13.4 05/06/2018   HCT 38.4 05/06/2018   MCV 89.1 05/06/2018   PLT 250 05/06/2018    Lab Results  Component Value Date   NA 137 05/06/2018   K 4.0 05/06/2018   CL 105 05/06/2018   CO2 21 (L) 05/06/2018   BUN 17 05/06/2018   CREATININE 0.80 07/13/2018   GLUCOSE 104 (H) 05/06/2018   ALT 25 05/06/2018    Lab Results  Component Value Date   CHOL 305 (H) 09/22/2015   HDL 83 09/22/2015   LDLCALC 174 (H) 09/22/2015   TRIG 241 (H) 09/22/2015   CHOLHDL 3.7 09/22/2015    --------------------------------------------------------------------------------------------------  ASSESSMENT AND PLAN: Atypical chest pain, shortness of breath, and palpitations: Symptoms have been stable for at least 6-12 months.  Workup this far, including TTE, DSE, and  14-day event monitor, has been unrevealing.  Given continued symptoms and familial hyperlipidemia, which places her at high risk for CAD, I have recommended that we proceed with [facility] and possible PCI.  I have reviewed the risks, indications, and alternatives to cardiac catheterization, possible angioplasty, and stenting with the patient. Risks include but are not limited to bleeding, infection, vascular injury, stroke, myocardial infection, arrhythmia, kidney injury, radiation-related injury in the case of prolonged fluoroscopy use, emergency cardiac surgery, and death. The patient understands the risks of serious complication is 1-2 in [id number] with diagnostic cardiac cath and 1-2% or less with angioplasty/stenting.  Given history of contrast reaction, we will premedicate her with prednisone on diphenhydramine.  Continue current medications.  Familial hyperlipidemia: LDL has remained quite elevated despite being on atorvastatin 80 mg daily.  Ms. Kosiorek was switched to rosuvastatin by Dr. Debara Pickett yesterday, which may provide modest further reduction in LDL, though she will likely need additional therapy in the future.  Ms. Mccone should continue to follow with Dr. Debara Pickett for management of her lipids.  Follow-up: Return to clinic in 2 weeks after catheterization.  [name], MD 02/12/2019 12:55 PM

## 2019-02-10 NOTE — Patient Instructions (Addendum)
Medication Instructions:  Your physician recommends that you continue on your current medications as directed. Please refer to the Current Medication list given to you today.  *If you need a refill on your cardiac medications before your next appointment, please call your pharmacy*  Lab Work: 1- Your physician recommends that you return for lab work in: [place] [facility] [place]. Please go to the [facility]. You will check in at the front desk to the right as you walk into the atrium. Valet Parking is offered if needed.  2- COVID PRE- TEST: You will need a COVID TEST prior to the procedure:  LOCATION: [facility] site.  DATE/TIME:  Friday, February 18, 2019 between 0800 am and 3 pm.   If you have labs (blood work) drawn today and your tests are completely normal, you will receive your results only by: Marland Kitchen MyChart Message (if you have MyChart) OR . A paper copy in the mail If you have any lab test that is abnormal or we need to change your treatment, we will call you to review the results.  Testing/Procedures: Your physician has requested that you have a cardiac catheterization. Cardiac catheterization is used to diagnose and/or treat various heart conditions. Doctors may recommend this procedure for a number of different reasons. The most common reason is to evaluate chest pain. Chest pain can be a symptom of coronary artery disease (CAD), and cardiac catheterization can show whether plaque is narrowing or blocking your heart's arteries. This procedure is also used to evaluate the valves, as well as measure the blood flow and oxygen levels in different parts of your heart. For further information please visit [email or web address]. Please follow instruction sheet, as given.  [facility] Cardiac Cath Instructions   You are scheduled for a RIGHT/LEFT Cardiac Cath on:____1/19/2021_______  Please arrive at __06:30__am on the day of your procedure  Please expect  a call from our [place] to pre-register you  Do not eat/drink anything after midnight  Someone will need to drive you home  It is recommended someone be with you for the first 24 hours after your procedure  Wear clothes that are easy to get on/off and wear slip on shoes if possible   Medications bring a current list of all medications with you  _xx_ You may take all of your medications the morning of your procedure with enough water to swallow safely  Day of your procedure: Arrive at the [place] entrance.  Free valet service is available.  After entering the [place] please check-in at the registration desk (1st desk on your right) to receive your armband. After receiving your armband someone will escort you to the cardiac cath/special procedures waiting area.  The usual length of stay after your procedure is about 2 to 3 hours.  This can vary.  If you have any questions, please call our office at [phone], or you may call the cardiac cath lab at [facility] directly at [phone]   Follow-Up: At [facility], you and your health needs are our priority.  As part of our continuing mission to provide you with exceptional heart care, we have created designated Provider Care Teams.  These Care Teams include your primary Cardiologist (physician) and Advanced Practice Providers (APPs -  Physician Assistants and Nurse Practitioners) who all work together to provide you with the care you need, when you need it.  Your next appointment:   2 week(s) after cath  The format for your next appointment:   In Person  Provider:    You may see DR Harrell Gave END or one of the following Advanced Practice Providers on your designated Care Team:    Murray Hodgkins, NP  Christell Faith, PA-C  Marrianne Mood, PA-C   Coronary Angiogram With Stent Coronary angiogram with stent placement is a procedure to widen or open a narrow blood vessel of the heart (coronary  artery). Arteries may become blocked by cholesterol buildup (plaques) in the lining of the artery wall. When a coronary artery becomes partially blocked, blood flow to that area decreases. This may lead to chest pain or a heart attack (myocardial infarction). A stent is a small piece of metal that looks like mesh or spring. Stent placement may be done as treatment after a heart attack, or to prevent a heart attack if a blocked artery is found by a coronary angiogram. Let your health care provider know about:  Any allergies you have, including allergies to medicines or contrast dye.  All medicines you are taking, including vitamins, herbs, eye drops, creams, and over-the-counter medicines.  Any problems you or family members have had with anesthetic medicines.  Any blood disorders you have.  Any surgeries you have had.  Any medical conditions you have, including kidney problems or kidney failure.  Whether you are pregnant or may be pregnant.  Whether you are breastfeeding. What are the risks? Generally, this is a safe procedure. However, serious problems may occur, including:  Damage to nearby structures or organs, such as the heart, blood vessels, or kidneys.  A return of blockage.  Bleeding, infection, or bruising at the insertion site.  A collection of blood under the skin (hematoma) at the insertion site.  A blood clot in another part of the body.  Allergic reaction to medicines or dyes.  Bleeding into the abdomen (retroperitoneal bleeding).  Stroke (rare).  Heart attack (rare). What happens before the procedure? Staying hydrated Follow instructions from your health care provider about hydration, which may include:  Up to 2 hours before the procedure - you may continue to drink clear liquids, such as water, clear fruit juice, black coffee, and plain tea.  Eating and drinking restrictions Follow instructions from your health care provider about eating and drinking,  which may include:  8 hours before the procedure - stop eating heavy meals or foods, such as meat, fried foods, or fatty foods.  6 hours before the procedure - stop eating light meals or foods, such as toast or cereal.  2 hours before the procedure - stop drinking clear liquids. Medicines Ask your health care provider about:  Changing or stopping your regular medicines. This is especially important if you are taking diabetes medicines or blood thinners.  Taking medicines such as aspirin and ibuprofen. These medicines can thin your blood. Do not take these medicines unless your health care provider tells you to take them. ? Generally, aspirin is recommended before a thin tube, called a catheter, is passed through a blood vessel and inserted into the heart (cardiac catheterization).  Taking over-the-counter medicines, vitamins, herbs, and supplements. General instructions  Do not use any products that contain nicotine or tobacco for at least 4 weeks before the procedure. These products include cigarettes, e-cigarettes, and chewing tobacco. If you need help quitting, ask your health care provider.  Plan to have someone take you home from the hospital or clinic.  If you will be going home right after the procedure, plan to  have someone with you for 24 hours.  You may have tests and imaging procedures.  Ask your health care provider: ? How your insertion site will be marked. Ask which artery will be used for the procedure. ? What steps will be taken to help prevent infection. These may include:  Removing hair at the insertion site.  Washing skin with a germ-killing soap.  Taking antibiotic medicine. What happens during the procedure?   An IV will be inserted into one of your veins.  Electrodes may be placed on your chest to monitor your heart rate during the procedure.  You will be given one or more of the following: ? A medicine to help you relax (sedative). ? A medicine to  numb the area (local anesthetic) for catheter insertion.  A small incision will be made for catheter insertion.  The catheter will be inserted into an artery using a guide wire. The location may be in your groin, your wrist, or the fold of your arm (near your elbow).  An X-ray procedure (fluoroscopy) will be used to help guide the catheter to the opening of the heart arteries.  A dye will be injected into the catheter. X-rays will be taken. The dye helps to show where any narrowing or blockages are located in the arteries.  Tell your health care provider if you have chest pain or trouble breathing.  A tiny wire will be guided to the blocked spot, and a balloon will be inflated to make the artery wider.  The stent will be expanded to crush the plaques into the wall of the vessel. The stent will hold the area open and improve the blood flow. Most stents have a drug coating to reduce the risk of the stent narrowing over time.  The artery may be made wider using a drill, laser, or other tools that remove plaques.  The catheter will be removed when the blood flow improves. The stent will stay where it was placed, and the lining of the artery will grow over it.  A bandage (dressing) will be placed on the insertion site. Pressure will be applied to stop bleeding.  The IV will be removed. This procedure may vary among health care providers and hospitals. What happens after the procedure?  Your blood pressure, heart rate, breathing rate, and blood oxygen level will be monitored until you leave the hospital or clinic.  If the procedure is done through the leg, you will lie flat in bed for a few hours or for as long as told by your health care provider. You will be instructed not to bend or cross your legs.  The insertion site and the pulse in your foot or wrist will be checked often.  You may have more blood tests, X-rays, and a test that records the electrical activity of your heart  (electrocardiogram, or ECG).  Do not drive for 24 hours if you were given a sedative during your procedure. Summary  Coronary angiogram with stent placement is a procedure to widen or open a narrowed coronary artery. This is done to treat heart problems.  Before the procedure, let your health care provider know about all the medical conditions and surgeries you have or have had.  This is a safe procedure. However, some problems may occur, including damage to nearby structures or organs, bleeding, blood clots, or allergies.  Follow your health care provider's instructions about eating, drinking, medicines, and other lifestyle changes, such as quitting tobacco use before the procedure. This  information is not intended to replace advice given to you by your health care provider. Make sure you discuss any questions you have with your health care provider. Document Revised: 08/11/2018 Document Reviewed: 08/11/2018 Elsevier Patient Education  [place].

## 2019-02-10 NOTE — H&P (View-Only) (Signed)
Follow-up Outpatient Visit Date: 02/10/2019  Primary Care Provider: Martin Majestic, FNP [place] [id number]  Chief Complaint: Follow-up chest pain and hyperlipidemia  HPI:  Kimberly Kemp is a 54 y.o. female with history of hypertension, hyperlipidemia, stroke, questionable paroxysmal atrial fibrillation, questionable pulmonary embolism, COPD, colon cancer, hepatitis B, anxiety, and tobacco use, who presents for follow-up of chest pain and shortness of breath.  I last saw her in early December, at which time Kimberly Kemp reported increasing stress after the death of her sister.  We had previously recommended using diltiazem for management of her chest pain and palpitations, though she did not tolerate this due to symptomatic hypotension and generalized weakness.  Subsequent 14-day event monitor showed no significant arrhythmia.  She was seen yesterday by Dr. Debara Pickett for evaluation and management of her familial hypercholesterolemia.  He switched her atorvastatin to rosuvastatin and raise the possibility of adding a PCSK9 inhibitor, though it does not look like further plans were made to explore this therapy.  Today, Kimberly Kemp reports that she feels about the same with intermittent chest pain, dyspnea, palpitations, and lightheadedness.  Episodes happen randomly and are not exertional.  She usually begins to notice lightheadedness following by sudden shortness of breath and chest tightness.  She then becomes aware of the sensation of her heart jumping.  Episodes usually last 5-10 minutes and are occurring several days per week.  There are stable in frequency and severity from prior visits.  She also endorses stable exertional dyspnea.  She denies edema.  --------------------------------------------------------------------------------------------------  Cardiovascular History & Procedures: Cardiovascular Problems:  Paroxysmal atrial fibrillation(questionable; no objective documentation  in the patient's chart)  Pulmonary embolism(questionable; no objective documentation in the patient's chart)  Shortness of breath  Risk Factors:  Prior stroke, hypertension, hyperlipidemia, and tobacco use  Cath/PCI:  None  CV Surgery:  None  EP Procedures and Devices:  14-day event monitor (01/05/2019): Predominantly sinus rhythm with rare PAC's and PVC's, as well as rare episodes of brief PSVT.  Patient triggered events correspond to sinus rhythm.  Non-Invasive Evaluation(s):  Dobutamine stress echocardiogram (11/16/2018): Low risk study with normal baseline LVEF and no evidence of inducible wall motion abnormality. LVEF 55-60%.  Exercise tolerance test (09/20/2018): Low to intermediate risk study with decreased exercise capacity and significant motion artifact.  Recent CV Pertinent Labs: Lab Results  Component Value Date   CHOL 305 (H) 09/22/2015   HDL 83 09/22/2015   LDLCALC 174 (H) 09/22/2015   TRIG 241 (H) 09/22/2015   CHOLHDL 3.7 09/22/2015   K 4.0 05/06/2018   BUN 17 05/06/2018   CREATININE 0.80 07/13/2018    Past medical and surgical history were reviewed and updated in EPIC.  Current Meds  Medication Sig  . albuterol (PROAIR HFA) 108 (90 Base) MCG/ACT inhaler INHALE 2 PUFFS INTO THE LUNGS EVERY FOUR HOURS AS NEEDED FOR WHEEZING (Patient taking differently: Inhale 2 puffs into the lungs every 4 (four) hours as needed for wheezing or shortness of breath. )  . albuterol (PROVENTIL) (2.5 MG/3ML) 0.083% nebulizer solution TAKE 3 MLS (2.5 MG TOTAL) BY NEBULIZATION 4 TIMES DAILY AS NEEDED FOR WHEEZING OR SHORTNESS OF BREATH  . aspirin EC 81 MG tablet Take 81 mg by mouth every morning.  . benzonatate (TESSALON PERLES) 100 MG capsule Take 1 capsule (100 mg total) by mouth every 6 (six) hours as needed.  . budesonide (PULMICORT) 0.25 MG/2ML nebulizer solution TAKE 2 MLS (0.25 MG TOTAL) BY NEBULIZATION 2 (TWO)  TIMES DAILY.  . butalbital-acetaminophen-caffeine  (FIORICET, ESGIC) 50-325-40 MG tablet Take 1 tablet by mouth 3 (three) times daily as needed for headache.  . dextromethorphan-guaiFENesin (MUCINEX DM) 30-600 MG per 12 hr tablet Take 1 tablet by mouth every 12 (twelve) hours.    . DULoxetine (CYMBALTA) 20 MG capsule TAKE ONE CAPSULE BY MOUTH EVERY DAY (Patient taking differently: Take 20 mg by mouth daily. )  . gabapentin (NEURONTIN) 300 MG capsule Take 300 mg by mouth at bedtime.  . hydrOXYzine (VISTARIL) 50 MG capsule Take 50 mg by mouth 2 (two) times daily as needed for anxiety.  . magic mouthwash w/lidocaine SOLN Take 10 mLs by mouth 4 (four) times daily as needed for mouth pain.  . meloxicam (MOBIC) 15 MG tablet Take 1 tablet (15 mg total) by mouth daily.  Marland Kitchen omeprazole (PRILOSEC) 40 MG capsule TAKE 1 CAPSULE BY MOUTH EVERY DAY (Patient taking differently: Take 40 mg by mouth daily. )  . ondansetron (ZOFRAN) 4 MG tablet Take 4 mg by mouth 3 (three) times daily.  Marland Kitchen PAZEO 0.7 % SOLN Place 1 drop into both eyes daily.  . Probiotic Product (PROBIOTIC-10 PO) Take 1 tablet by mouth daily.  . promethazine (PHENERGAN) 25 MG tablet Take 25 mg by mouth 4 (four) times daily.  . promethazine-codeine (PHENERGAN WITH CODEINE) 6.25-10 MG/5ML syrup Take 5 mLs by mouth every 6 (six) hours as needed for cough.  . rosuvastatin (CRESTOR) 40 MG tablet Take 1 tablet (40 mg total) by mouth daily.    Allergies: Penicillins, Omnipaque [iohexol], and Red dye  Social History   Tobacco Use  . Smoking status: Current Every Day Smoker    Packs/day: 0.50    Years: 43.00    Pack years: 21.50    Types: Cigarettes  . Smokeless tobacco: Never Used  . Tobacco comment: cutting back  Substance Use Topics  . Alcohol use: No    Alcohol/week: 0.0 standard drinks  . Drug use: No    Family History  Problem Relation Age of Onset  . Emphysema Father   . Prostate cancer Father   . Liver disease Father   . Emphysema Maternal Grandmother   . Diabetes Mother   . Heart  disease Mother 3       CABG and valve replacement  . Colon cancer Neg Hx     Review of Systems: A 12-system review of systems was performed and was negative except as noted in the HPI.  --------------------------------------------------------------------------------------------------  Physical Exam: BP (!) 160/64 (BP Location: Left Arm, Patient Position: Sitting, Cuff Size: Normal)   Pulse 80   Ht 5\' 3"  (1.6 m)   Wt 129 lb (58.5 kg)   LMP 07/18/2016   SpO2 98%   BMI 22.85 kg/m   General:  NAD. HEENT: No conjunctival pallor or scleral icterus. Facemask in place. Neck: Supple without lymphadenopathy, thyromegaly, JVD, or HJR. Lungs: Normal work of breathing. Coarse breath sounds without wheezes or crackles. Heart: Regular rate and rhythm without murmurs, rubs, or gallops. Non-displaced PMI. Abd: Bowel sounds present. Soft, NT/ND without hepatosplenomegaly Ext: No lower extremity edema. Radial, PT, and DP pulses are 2+ bilaterally. Skin: Warm and dry without rash.  EKG:  Normal sinus rhythm with possible left atria enlargement.  Otherwise, no significant abnormality.  Lab Results  Component Value Date   WBC 8.2 05/06/2018   HGB 13.4 05/06/2018   HCT 38.4 05/06/2018   MCV 89.1 05/06/2018   PLT 250 05/06/2018    Lab Results  Component Value Date   NA 137 05/06/2018   K 4.0 05/06/2018   CL 105 05/06/2018   CO2 21 (L) 05/06/2018   BUN 17 05/06/2018   CREATININE 0.80 07/13/2018   GLUCOSE 104 (H) 05/06/2018   ALT 25 05/06/2018    Lab Results  Component Value Date   CHOL 305 (H) 09/22/2015   HDL 83 09/22/2015   LDLCALC 174 (H) 09/22/2015   TRIG 241 (H) 09/22/2015   CHOLHDL 3.7 09/22/2015    --------------------------------------------------------------------------------------------------  ASSESSMENT AND PLAN: Atypical chest pain, shortness of breath, and palpitations: Symptoms have been stable for at least 6-12 months.  Workup this far, including TTE, DSE, and  14-day event monitor, has been unrevealing.  Given continued symptoms and familial hyperlipidemia, which places her at high risk for CAD, I have recommended that we proceed with [facility] and possible PCI.  I have reviewed the risks, indications, and alternatives to cardiac catheterization, possible angioplasty, and stenting with the patient. Risks include but are not limited to bleeding, infection, vascular injury, stroke, myocardial infection, arrhythmia, kidney injury, radiation-related injury in the case of prolonged fluoroscopy use, emergency cardiac surgery, and death. The patient understands the risks of serious complication is 1-2 in [id number] with diagnostic cardiac cath and 1-2% or less with angioplasty/stenting.  Given history of contrast reaction, we will premedicate her with prednisone on diphenhydramine.  Continue current medications.  Familial hyperlipidemia: LDL has remained quite elevated despite being on atorvastatin 80 mg daily.  Kimberly Kemp was switched to rosuvastatin by Dr. Debara Pickett yesterday, which may provide modest further reduction in LDL, though she will likely need additional therapy in the future.  Kimberly Kemp should continue to follow with Dr. Debara Pickett for management of her lipids.  Follow-up: Return to clinic in 2 weeks after catheterization.  [name], MD 02/12/2019 12:55 PM

## 2019-02-12 ENCOUNTER — Encounter: Payer: Self-pay | Admitting: Internal Medicine

## 2019-02-14 ENCOUNTER — Telehealth: Payer: Self-pay | Admitting: *Deleted

## 2019-02-14 NOTE — Telephone Encounter (Signed)
-----   Message from [name], MD sent at 02/12/2019  1:07 PM EST ----- Hi Kimberly Kemp,I forgot to mention that Kimberly Kemp has history of contrast allergy.  Could you touch base with her and have her take prednisone 50 mg 13, 7, and 1 hour before the procedure and Benadryl 25 mg 1 hour before the procedure?  Thanks.Gerald Stabs

## 2019-02-16 IMAGING — DX DG WRIST COMPLETE 3+V*L*
4 series · 4 of 4 positions shown · non-contrast
Comparison: Radiographs March 29, 2016.

CLINICAL DATA: Puncture wound to left wrist.

EXAM:
LEFT WRIST - COMPLETE 3+ VIEW

[wrist ap (1 of 2)]
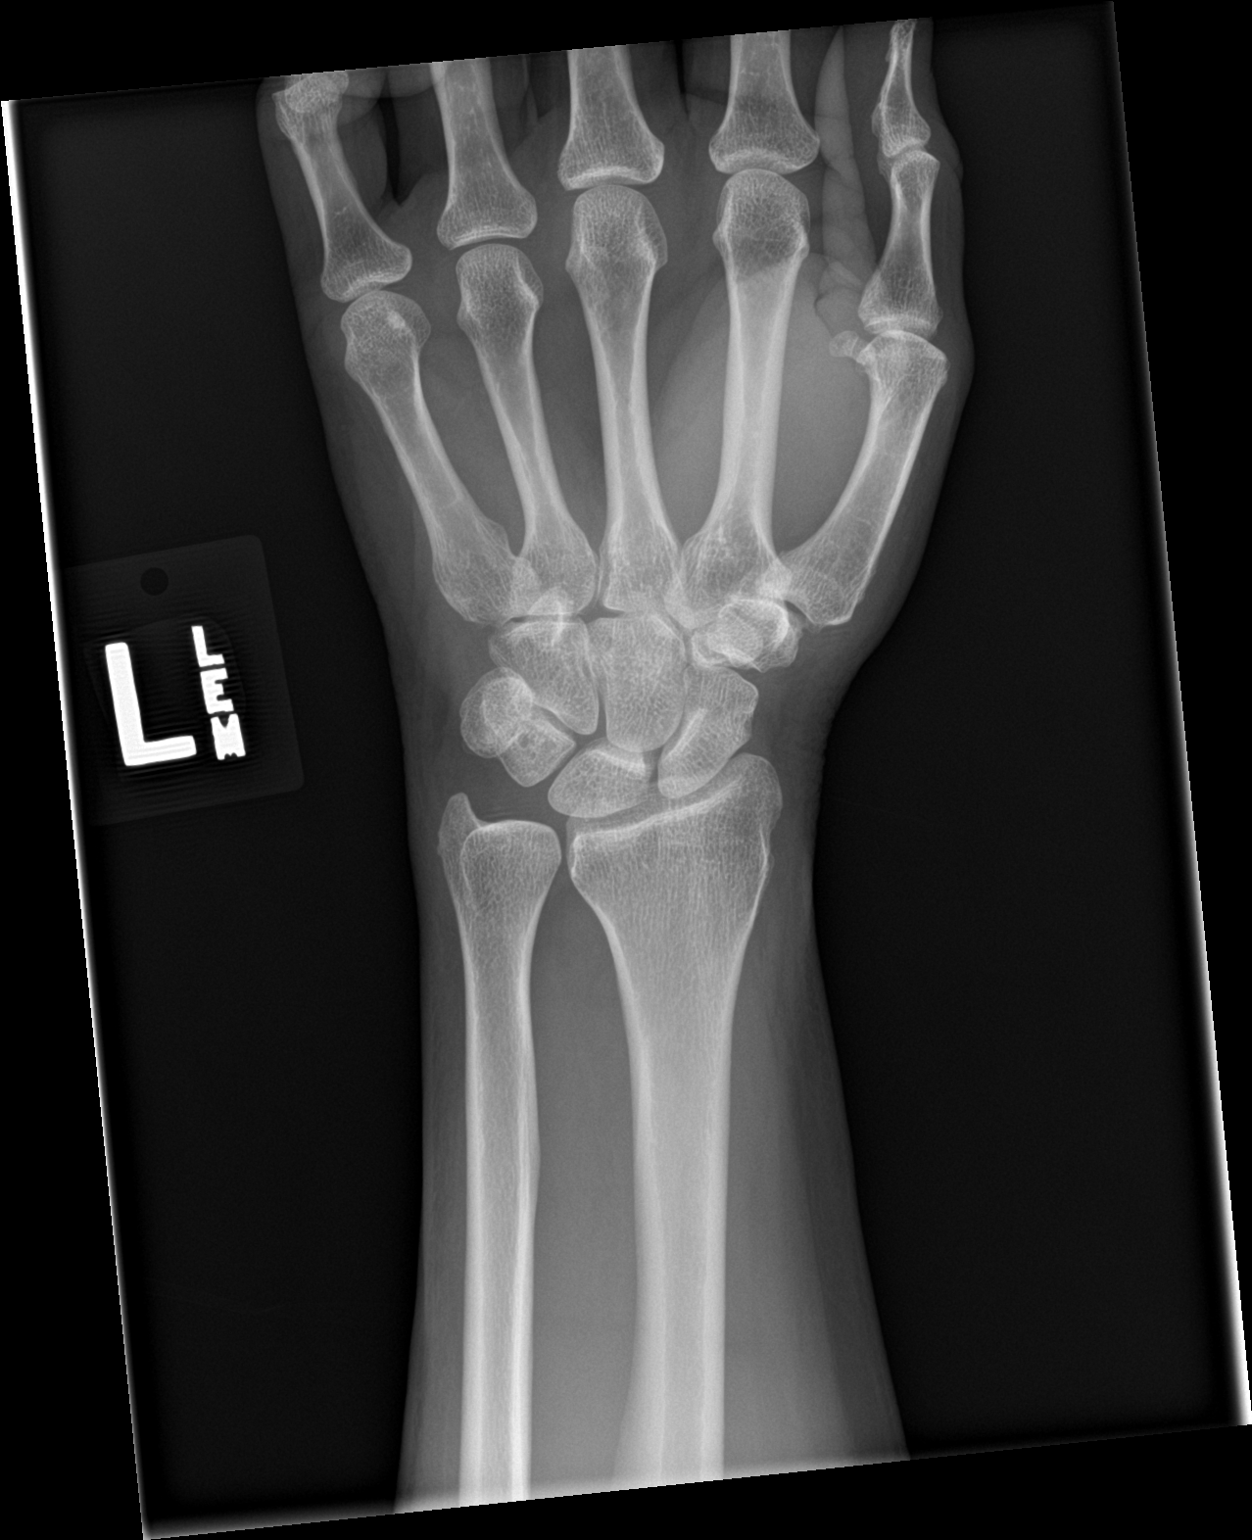

[wrist obl]
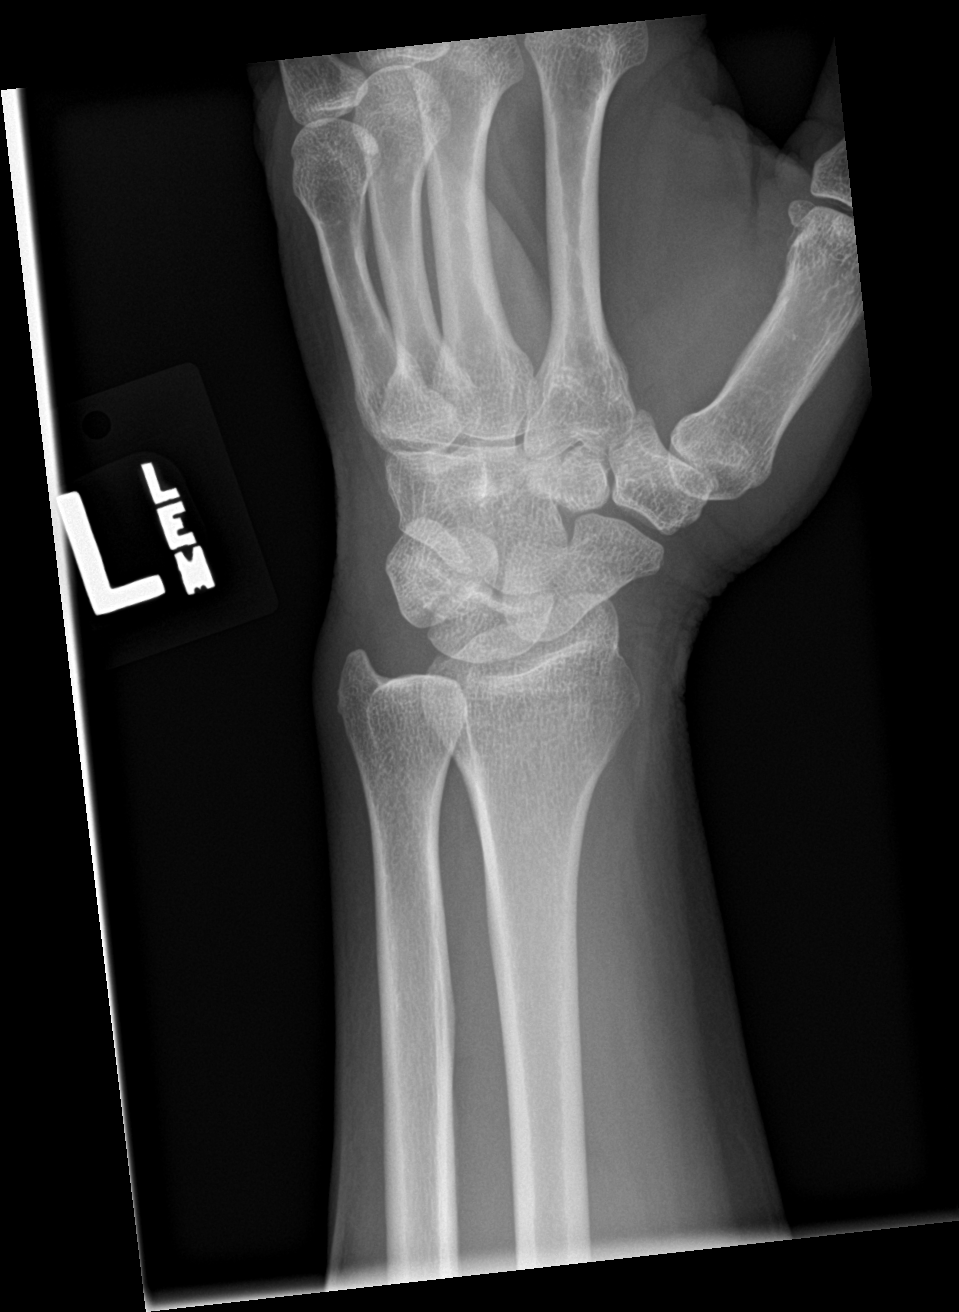

[wrist lat]
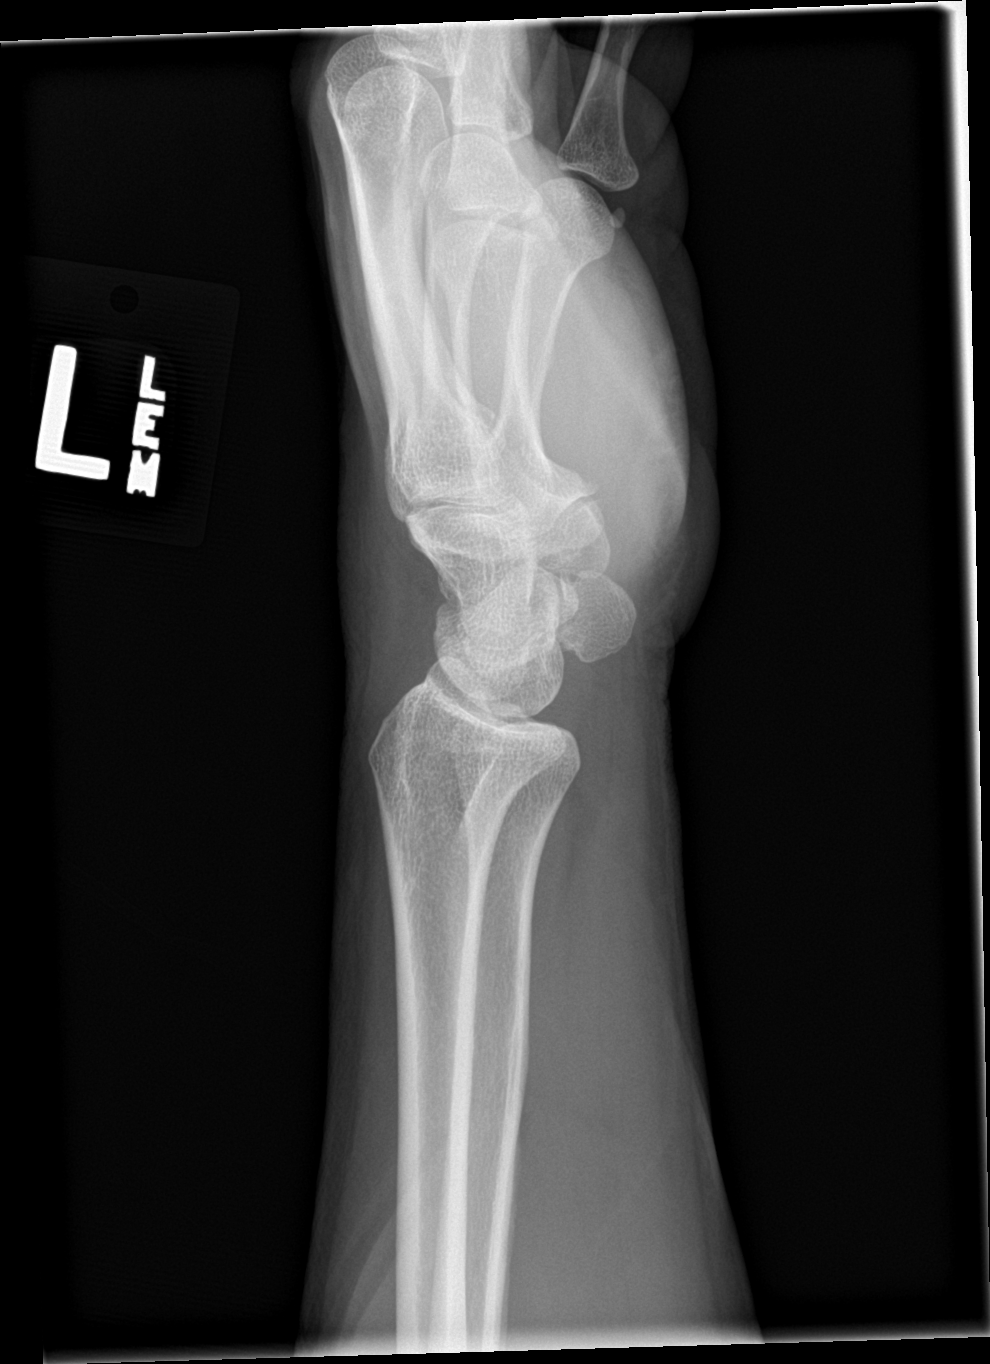

[wrist ap (2 of 2)]
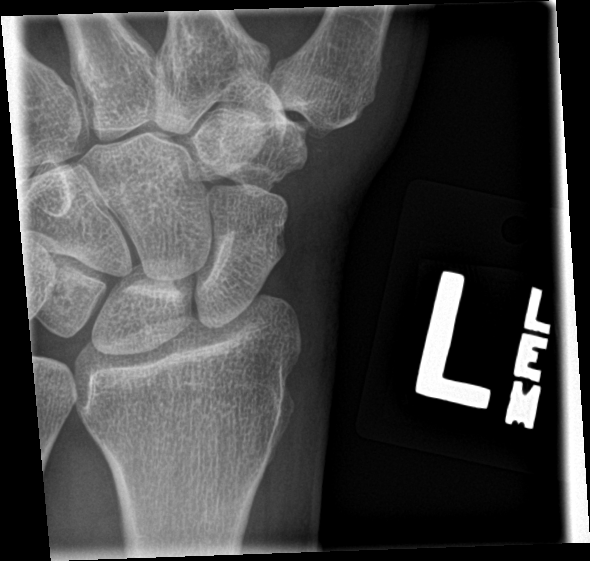

[4 of 4 positions shown; findings below may reference images not displayed]

FINDINGS: There is no evidence of fracture or dislocation. There is no
evidence of arthropathy or other focal bone abnormality. Soft
tissues are unremarkable.
IMPRESSION: Negative.

## 2019-02-16 MED ORDER — PREDNISONE 50 MG PO TABS: ORAL_TABLET | ORAL | 0 refills | Status: DC

## 2019-02-16 NOTE — Telephone Encounter (Signed)
No answer. Left message to call back.   

## 2019-02-16 NOTE — Telephone Encounter (Signed)
Called patient and she verbalized understanding of the taking prednisone 50 mg at 13, 7, and 1 hour prior to procedure and Benadryl 25 mg 1 hour prior to procedure. Rx sent to pharmacy.

## 2019-02-18 ENCOUNTER — Other Ambulatory Visit
Admission: RE | Admit: 2019-02-18 | Discharge: 2019-02-18 | Disposition: A | Discharge status: Home / Self Care | Payer: Medicare Other | Source: Ambulatory Visit | Attending: Pulmonary Disease | Admitting: Pulmonary Disease

## 2019-02-18 ENCOUNTER — Other Ambulatory Visit: Payer: Self-pay

## 2019-02-18 DIAGNOSIS — J449 Chronic obstructive pulmonary disease, unspecified: Secondary | ICD-10-CM | POA: Diagnosis not present

## 2019-02-18 DIAGNOSIS — Z01812 Encounter for preprocedural laboratory examination: Secondary | ICD-10-CM | POA: Diagnosis not present

## 2019-02-18 DIAGNOSIS — Z1389 Encounter for screening for other disorder: Secondary | ICD-10-CM | POA: Diagnosis not present

## 2019-02-18 DIAGNOSIS — E559 Vitamin D deficiency, unspecified: Secondary | ICD-10-CM | POA: Diagnosis not present

## 2019-02-18 DIAGNOSIS — K219 Gastro-esophageal reflux disease without esophagitis: Secondary | ICD-10-CM | POA: Diagnosis not present

## 2019-02-18 DIAGNOSIS — Z20822 Contact with and (suspected) exposure to covid-19: Secondary | ICD-10-CM | POA: Insufficient documentation

## 2019-02-18 DIAGNOSIS — E7800 Pure hypercholesterolemia, unspecified: Secondary | ICD-10-CM | POA: Diagnosis not present

## 2019-02-19 LAB — SARS CORONAVIRUS 2 (TAT 6-24 HRS): SARS Coronavirus 2: NEGATIVE

## 2019-02-21 ENCOUNTER — Other Ambulatory Visit
Admission: RE | Admit: 2019-02-21 | Discharge: 2019-02-21 | Disposition: A | Discharge status: Home / Self Care | Payer: Medicare Other | Source: Home / Self Care | Attending: Internal Medicine | Admitting: Internal Medicine

## 2019-02-21 ENCOUNTER — Telehealth: Payer: Self-pay | Admitting: *Deleted

## 2019-02-21 DIAGNOSIS — K219 Gastro-esophageal reflux disease without esophagitis: Secondary | ICD-10-CM | POA: Diagnosis not present

## 2019-02-21 DIAGNOSIS — I2510 Atherosclerotic heart disease of native coronary artery without angina pectoris: Secondary | ICD-10-CM | POA: Diagnosis not present

## 2019-02-21 DIAGNOSIS — I2119 ST elevation (STEMI) myocardial infarction involving other coronary artery of inferior wall: Secondary | ICD-10-CM | POA: Diagnosis not present

## 2019-02-21 DIAGNOSIS — F419 Anxiety disorder, unspecified: Secondary | ICD-10-CM | POA: Diagnosis present

## 2019-02-21 DIAGNOSIS — E7849 Other hyperlipidemia: Secondary | ICD-10-CM | POA: Diagnosis not present

## 2019-02-21 DIAGNOSIS — Z85038 Personal history of other malignant neoplasm of large intestine: Secondary | ICD-10-CM | POA: Diagnosis not present

## 2019-02-21 DIAGNOSIS — I10 Essential (primary) hypertension: Secondary | ICD-10-CM | POA: Diagnosis not present

## 2019-02-21 DIAGNOSIS — R0602 Shortness of breath: Secondary | ICD-10-CM | POA: Diagnosis not present

## 2019-02-21 DIAGNOSIS — J441 Chronic obstructive pulmonary disease with (acute) exacerbation: Secondary | ICD-10-CM | POA: Diagnosis not present

## 2019-02-21 DIAGNOSIS — Z880 Allergy status to penicillin: Secondary | ICD-10-CM | POA: Diagnosis not present

## 2019-02-21 DIAGNOSIS — Z9102 Food additives allergy status: Secondary | ICD-10-CM | POA: Diagnosis not present

## 2019-02-21 DIAGNOSIS — I480 Paroxysmal atrial fibrillation: Secondary | ICD-10-CM | POA: Diagnosis not present

## 2019-02-21 DIAGNOSIS — Z8673 Personal history of transient ischemic attack (TIA), and cerebral infarction without residual deficits: Secondary | ICD-10-CM | POA: Diagnosis not present

## 2019-02-21 DIAGNOSIS — R079 Chest pain, unspecified: Secondary | ICD-10-CM | POA: Diagnosis not present

## 2019-02-21 DIAGNOSIS — Z01810 Encounter for preprocedural cardiovascular examination: Secondary | ICD-10-CM

## 2019-02-21 DIAGNOSIS — Z7982 Long term (current) use of aspirin: Secondary | ICD-10-CM | POA: Diagnosis not present

## 2019-02-21 DIAGNOSIS — Z79899 Other long term (current) drug therapy: Secondary | ICD-10-CM | POA: Diagnosis not present

## 2019-02-21 DIAGNOSIS — Z888 Allergy status to other drugs, medicaments and biological substances status: Secondary | ICD-10-CM | POA: Diagnosis not present

## 2019-02-21 LAB — CBC WITH DIFFERENTIAL/PLATELET
Abs Immature Granulocytes: 0.03 10*3/uL (ref 0.00–0.07)
Basophils Absolute: 0.1 10*3/uL (ref 0.0–0.1)
Basophils Relative: 1 %
Eosinophils Absolute: 0.3 10*3/uL (ref 0.0–0.5)
Eosinophils Relative: 3 %
HCT: 35.5 % — ABNORMAL LOW (ref 36.0–46.0)
Hemoglobin: 12 g/dL (ref 12.0–15.0)
Immature Granulocytes: 0 %
Lymphocytes Relative: 37 %
Lymphs Abs: 3.9 10*3/uL (ref 0.7–4.0)
MCH: 30.8 pg (ref 26.0–34.0)
MCHC: 33.8 g/dL (ref 30.0–36.0)
MCV: 91 fL (ref 80.0–100.0)
Monocytes Absolute: 0.8 10*3/uL (ref 0.1–1.0)
Monocytes Relative: 8 %
Neutro Abs: 5.3 10*3/uL (ref 1.7–7.7)
Neutrophils Relative %: 51 %
Platelets: 218 10*3/uL (ref 150–400)
RBC: 3.9 MIL/uL (ref 3.87–5.11)
RDW: 13.6 % (ref 11.5–15.5)
WBC: 10.4 10*3/uL (ref 4.0–10.5)
nRBC: 0 % (ref 0.0–0.2)

## 2019-02-21 LAB — BASIC METABOLIC PANEL
Anion gap: 11 (ref 5–15)
BUN: 17 mg/dL (ref 6–20)
CO2: 21 mmol/L — ABNORMAL LOW (ref 22–32)
Calcium: 9.4 mg/dL (ref 8.9–10.3)
Chloride: 109 mmol/L (ref 98–111)
Creatinine, Ser: 1.25 mg/dL — ABNORMAL HIGH (ref 0.44–1.00)
GFR calc Af Amer: 57 mL/min — ABNORMAL LOW (ref >60)
GFR calc non Af Amer: 49 mL/min — ABNORMAL LOW (ref >60)
Glucose, Bld: 96 mg/dL (ref 70–99)
Potassium: 3.8 mmol/L (ref 3.5–5.1)
Sodium: 141 mmol/L (ref 135–145)

## 2019-02-21 NOTE — Telephone Encounter (Signed)
Patient has not gotten pre-procedural lab work for heart cath in the morning. Called patient and [facility]. She verbalized understanding of pre-procedural instructions and has her prednisone and benadryl ready as well.

## 2019-02-22 ENCOUNTER — Telehealth: Payer: Self-pay | Admitting: Student

## 2019-02-22 ENCOUNTER — Encounter
Admission: AD | Disposition: A | Discharge status: Home / Self Care | Payer: Self-pay | Source: Home / Self Care | Attending: Internal Medicine

## 2019-02-22 ENCOUNTER — Other Ambulatory Visit: Payer: Self-pay

## 2019-02-22 ENCOUNTER — Inpatient Hospital Stay
Admission: AD | Admit: 2019-02-22 | Discharge: 2019-02-23 | DRG: 247 | Disposition: A | Discharge status: Home / Self Care | Payer: Medicare Other | Attending: Internal Medicine | Admitting: Internal Medicine

## 2019-02-22 ENCOUNTER — Ambulatory Visit: Payer: Medicare Other | Admitting: Pulmonary Disease

## 2019-02-22 ENCOUNTER — Encounter: Payer: Self-pay | Admitting: Internal Medicine

## 2019-02-22 DIAGNOSIS — Z720 Tobacco use: Secondary | ICD-10-CM | POA: Diagnosis present

## 2019-02-22 DIAGNOSIS — I480 Paroxysmal atrial fibrillation: Secondary | ICD-10-CM | POA: Diagnosis present

## 2019-02-22 DIAGNOSIS — Z888 Allergy status to other drugs, medicaments and biological substances status: Secondary | ICD-10-CM | POA: Diagnosis not present

## 2019-02-22 DIAGNOSIS — Z7982 Long term (current) use of aspirin: Secondary | ICD-10-CM

## 2019-02-22 DIAGNOSIS — Z8673 Personal history of transient ischemic attack (TIA), and cerebral infarction without residual deficits: Secondary | ICD-10-CM | POA: Diagnosis not present

## 2019-02-22 DIAGNOSIS — Z880 Allergy status to penicillin: Secondary | ICD-10-CM | POA: Diagnosis not present

## 2019-02-22 DIAGNOSIS — E785 Hyperlipidemia, unspecified: Secondary | ICD-10-CM | POA: Diagnosis present

## 2019-02-22 DIAGNOSIS — I2119 ST elevation (STEMI) myocardial infarction involving other coronary artery of inferior wall: Principal | ICD-10-CM

## 2019-02-22 DIAGNOSIS — R079 Chest pain, unspecified: Secondary | ICD-10-CM | POA: Diagnosis present

## 2019-02-22 DIAGNOSIS — Z79899 Other long term (current) drug therapy: Secondary | ICD-10-CM | POA: Diagnosis not present

## 2019-02-22 DIAGNOSIS — E7849 Other hyperlipidemia: Secondary | ICD-10-CM | POA: Diagnosis present

## 2019-02-22 DIAGNOSIS — K219 Gastro-esophageal reflux disease without esophagitis: Secondary | ICD-10-CM | POA: Diagnosis present

## 2019-02-22 DIAGNOSIS — I200 Unstable angina: Secondary | ICD-10-CM | POA: Insufficient documentation

## 2019-02-22 DIAGNOSIS — J441 Chronic obstructive pulmonary disease with (acute) exacerbation: Secondary | ICD-10-CM | POA: Diagnosis present

## 2019-02-22 DIAGNOSIS — I10 Essential (primary) hypertension: Secondary | ICD-10-CM | POA: Diagnosis present

## 2019-02-22 DIAGNOSIS — Z85038 Personal history of other malignant neoplasm of large intestine: Secondary | ICD-10-CM

## 2019-02-22 DIAGNOSIS — Z9102 Food additives allergy status: Secondary | ICD-10-CM

## 2019-02-22 DIAGNOSIS — I2510 Atherosclerotic heart disease of native coronary artery without angina pectoris: Secondary | ICD-10-CM | POA: Diagnosis present

## 2019-02-22 DIAGNOSIS — I25118 Atherosclerotic heart disease of native coronary artery with other forms of angina pectoris: Secondary | ICD-10-CM

## 2019-02-22 DIAGNOSIS — E782 Mixed hyperlipidemia: Secondary | ICD-10-CM | POA: Diagnosis present

## 2019-02-22 DIAGNOSIS — I25119 Atherosclerotic heart disease of native coronary artery with unspecified angina pectoris: Secondary | ICD-10-CM

## 2019-02-22 DIAGNOSIS — R0602 Shortness of breath: Secondary | ICD-10-CM | POA: Diagnosis not present

## 2019-02-22 DIAGNOSIS — F411 Generalized anxiety disorder: Secondary | ICD-10-CM | POA: Diagnosis present

## 2019-02-22 DIAGNOSIS — F419 Anxiety disorder, unspecified: Secondary | ICD-10-CM | POA: Diagnosis present

## 2019-02-22 DIAGNOSIS — R002 Palpitations: Secondary | ICD-10-CM | POA: Diagnosis present

## 2019-02-22 HISTORY — PX: CORONARY PRESSURE/FFR STUDY: CATH118243

## 2019-02-22 HISTORY — DX: Malignant neoplasm of colon, unspecified: C18.9

## 2019-02-22 HISTORY — DX: Other chest pain: R07.89

## 2019-02-22 HISTORY — DX: Atherosclerotic heart disease of native coronary artery without angina pectoris: I25.10

## 2019-02-22 HISTORY — PX: RIGHT/LEFT HEART CATH AND CORONARY ANGIOGRAPHY: CATH118266

## 2019-02-22 HISTORY — DX: Personal history of other medical treatment: Z92.89

## 2019-02-22 HISTORY — PX: CORONARY STENT INTERVENTION: CATH118234

## 2019-02-22 HISTORY — DX: Palpitations: R00.2

## 2019-02-22 LAB — POCT ACTIVATED CLOTTING TIME
Activated Clotting Time: 230 seconds
Activated Clotting Time: 246 seconds
Activated Clotting Time: 312 seconds

## 2019-02-22 LAB — GLUCOSE, CAPILLARY: Glucose-Capillary: 133 mg/dL — ABNORMAL HIGH (ref 70–99)

## 2019-02-22 LAB — MRSA PCR SCREENING: MRSA by PCR: NEGATIVE

## 2019-02-22 SURGERY — RIGHT/LEFT HEART CATH AND CORONARY ANGIOGRAPHY
Anesthesia: Moderate Sedation

## 2019-02-22 MED ORDER — TICAGRELOR 90 MG PO TABS
ORAL_TABLET | ORAL | Status: DC | PRN
  Administered 2019-02-22: 180 mg via ORAL

## 2019-02-22 MED ORDER — MIDAZOLAM HCL 2 MG/2ML IJ SOLN
INTRAMUSCULAR | Status: AC
  Filled 2019-02-22: qty 2

## 2019-02-22 MED ORDER — HEPARIN SODIUM (PORCINE) 1000 UNIT/ML IJ SOLN
INTRAMUSCULAR | Status: AC
  Filled 2019-02-22: qty 1

## 2019-02-22 MED ORDER — BUTALBITAL-APAP-CAFFEINE 50-325-40 MG PO TABS
1.0000 | ORAL_TABLET | Freq: Three times a day (TID) | ORAL | Status: DC | PRN
  Filled 2019-02-22: qty 1

## 2019-02-22 MED ORDER — ACETAMINOPHEN 325 MG PO TABS
650.0000 mg | ORAL_TABLET | ORAL | Status: DC | PRN
  Administered 2019-02-23 (×2): 650 mg via ORAL
  Filled 2019-02-22 (×2): qty 2

## 2019-02-22 MED ORDER — IOHEXOL 300 MG/ML  SOLN
INTRAMUSCULAR | Status: DC | PRN
  Administered 2019-02-22: 11:00:00 155 mL

## 2019-02-22 MED ORDER — HEPARIN (PORCINE) IN NACL 1000-0.9 UT/500ML-% IV SOLN
INTRAVENOUS | Status: DC | PRN
  Administered 2019-02-22: 1000 mL

## 2019-02-22 MED ORDER — ALBUTEROL SULFATE (2.5 MG/3ML) 0.083% IN NEBU
INHALATION_SOLUTION | RESPIRATORY_TRACT | Status: AC
  Filled 2019-02-22: qty 3

## 2019-02-22 MED ORDER — ONDANSETRON HCL 4 MG/2ML IJ SOLN: 4.0000 mg | Freq: Four times a day (QID) | INTRAMUSCULAR | Status: DC | PRN

## 2019-02-22 MED ORDER — NITROGLYCERIN IN D5W 200-5 MCG/ML-% IV SOLN
INTRAVENOUS | Status: AC | PRN
  Administered 2019-02-22: 10 ug/min via INTRAVENOUS

## 2019-02-22 MED ORDER — PROMETHAZINE HCL 25 MG/ML IJ SOLN
12.5000 mg | Freq: Four times a day (QID) | INTRAMUSCULAR | Status: DC | PRN
  Administered 2019-02-22: 12.5 mg via INTRAVENOUS
  Filled 2019-02-22: qty 1

## 2019-02-22 MED ORDER — IPRATROPIUM BROMIDE 0.02 % IN SOLN
0.5000 mg | Freq: Four times a day (QID) | RESPIRATORY_TRACT | Status: DC
  Administered 2019-02-22: 15:00:00 0.5 mg via RESPIRATORY_TRACT
  Filled 2019-02-22: qty 2.5

## 2019-02-22 MED ORDER — CANGRELOR BOLUS VIA INFUSION
INTRAVENOUS | Status: DC | PRN
  Administered 2019-02-22: 10:00:00 1686 ug via INTRAVENOUS

## 2019-02-22 MED ORDER — VERAPAMIL HCL 2.5 MG/ML IV SOLN
INTRAVENOUS | Status: DC | PRN
  Administered 2019-02-22: 2.5 mg via INTRA_ARTERIAL

## 2019-02-22 MED ORDER — ENOXAPARIN SODIUM 40 MG/0.4ML ~~LOC~~ SOLN
40.0000 mg | SUBCUTANEOUS | Status: DC
  Administered 2019-02-23: 40 mg via SUBCUTANEOUS
  Filled 2019-02-22: qty 0.4

## 2019-02-22 MED ORDER — HEPARIN (PORCINE) IN NACL 1000-0.9 UT/500ML-% IV SOLN
INTRAVENOUS | Status: AC
  Filled 2019-02-22: qty 1000

## 2019-02-22 MED ORDER — NITROGLYCERIN IN D5W 200-5 MCG/ML-% IV SOLN: 0.0000 ug/min | INTRAVENOUS | Status: DC

## 2019-02-22 MED ORDER — MIDAZOLAM HCL 2 MG/2ML IJ SOLN
INTRAMUSCULAR | Status: AC
  Filled 2019-02-22: qty 4

## 2019-02-22 MED ORDER — SODIUM CHLORIDE 0.9 % IV SOLN: INTRAVENOUS | Status: AC

## 2019-02-22 MED ORDER — FAMOTIDINE 20 MG PO TABS
40.0000 mg | ORAL_TABLET | Freq: Every day | ORAL | Status: DC
  Administered 2019-02-22: 40 mg via ORAL
  Filled 2019-02-22: qty 2

## 2019-02-22 MED ORDER — SODIUM CHLORIDE 0.9 % WEIGHT BASED INFUSION
3.0000 mL/kg/h | INTRAVENOUS | Status: DC
  Administered 2019-02-22: 08:00:00 3 mL/kg/h via INTRAVENOUS

## 2019-02-22 MED ORDER — GABAPENTIN 300 MG PO CAPS
300.0000 mg | ORAL_CAPSULE | Freq: Every day | ORAL | Status: DC
  Administered 2019-02-22: 21:00:00 300 mg via ORAL
  Filled 2019-02-22: qty 1

## 2019-02-22 MED ORDER — SODIUM CHLORIDE 0.9% FLUSH: 3.0000 mL | INTRAVENOUS | Status: DC | PRN

## 2019-02-22 MED ORDER — ONDANSETRON HCL 4 MG/2ML IJ SOLN
INTRAMUSCULAR | Status: AC
  Filled 2019-02-22: qty 2

## 2019-02-22 MED ORDER — ROSUVASTATIN CALCIUM 20 MG PO TABS
40.0000 mg | ORAL_TABLET | Freq: Every day | ORAL | Status: DC
  Administered 2019-02-23: 11:00:00 40 mg via ORAL
  Filled 2019-02-22: qty 4
  Filled 2019-02-22: qty 2

## 2019-02-22 MED ORDER — BUDESONIDE 0.25 MG/2ML IN SUSP
0.2500 mg | Freq: Two times a day (BID) | RESPIRATORY_TRACT | Status: DC
  Administered 2019-02-22: 15:00:00 0.25 mg via RESPIRATORY_TRACT
  Filled 2019-02-22 (×6): qty 2

## 2019-02-22 MED ORDER — TICAGRELOR 90 MG PO TABS
ORAL_TABLET | ORAL | Status: AC
  Filled 2019-02-22: qty 2

## 2019-02-22 MED ORDER — VERAPAMIL HCL 2.5 MG/ML IV SOLN
INTRAVENOUS | Status: AC
  Filled 2019-02-22: qty 2

## 2019-02-22 MED ORDER — NITROGLYCERIN 1 MG/10 ML FOR IR/CATH LAB
INTRA_ARTERIAL | Status: DC | PRN
  Administered 2019-02-22 (×3): 200 ug via INTRACORONARY

## 2019-02-22 MED ORDER — ONDANSETRON HCL 4 MG/2ML IJ SOLN
INTRAMUSCULAR | Status: DC | PRN
  Administered 2019-02-22: 4 mg via INTRAVENOUS

## 2019-02-22 MED ORDER — MONTELUKAST SODIUM 10 MG PO TABS
10.0000 mg | ORAL_TABLET | Freq: Every day | ORAL | Status: DC
  Administered 2019-02-23: 10 mg via ORAL
  Filled 2019-02-22: qty 1

## 2019-02-22 MED ORDER — MIDAZOLAM HCL 2 MG/2ML IJ SOLN
INTRAMUSCULAR | Status: DC | PRN
  Administered 2019-02-22 (×3): 1 mg via INTRAVENOUS

## 2019-02-22 MED ORDER — SODIUM CHLORIDE 0.9 % WEIGHT BASED INFUSION: 1.0000 mL/kg/h | INTRAVENOUS | Status: DC

## 2019-02-22 MED ORDER — METOPROLOL TARTRATE 25 MG PO TABS
12.5000 mg | ORAL_TABLET | Freq: Two times a day (BID) | ORAL | Status: DC
  Administered 2019-02-22 – 2019-02-23 (×2): 12.5 mg via ORAL
  Filled 2019-02-22 (×2): qty 1

## 2019-02-22 MED ORDER — SODIUM CHLORIDE 0.9% FLUSH
3.0000 mL | Freq: Two times a day (BID) | INTRAVENOUS | Status: DC
  Administered 2019-02-22 – 2019-02-23 (×3): 3 mL via INTRAVENOUS

## 2019-02-22 MED ORDER — ALBUTEROL SULFATE (2.5 MG/3ML) 0.083% IN NEBU
2.5000 mg | INHALATION_SOLUTION | Freq: Four times a day (QID) | RESPIRATORY_TRACT | Status: DC
  Administered 2019-02-22: 15:00:00 2.5 mg via RESPIRATORY_TRACT

## 2019-02-22 MED ORDER — OLOPATADINE HCL 0.1 % OP SOLN
1.0000 [drp] | Freq: Every day | OPHTHALMIC | Status: DC
  Administered 2019-02-23: 1 [drp] via OPHTHALMIC
  Filled 2019-02-22: qty 5

## 2019-02-22 MED ORDER — ATROPINE SULFATE 1 MG/10ML IJ SOSY
PREFILLED_SYRINGE | INTRAMUSCULAR | Status: AC
  Filled 2019-02-22: qty 10

## 2019-02-22 MED ORDER — FENTANYL CITRATE (PF) 100 MCG/2ML IJ SOLN
INTRAMUSCULAR | Status: AC
  Filled 2019-02-22: qty 2

## 2019-02-22 MED ORDER — SODIUM CHLORIDE 0.9 % IV SOLN
INTRAVENOUS | Status: AC | PRN
  Administered 2019-02-22: 10:00:00 3.986 ug/kg/min via INTRAVENOUS

## 2019-02-22 MED ORDER — TICAGRELOR 90 MG PO TABS
90.0000 mg | ORAL_TABLET | Freq: Two times a day (BID) | ORAL | Status: DC
  Administered 2019-02-22 – 2019-02-23 (×2): 90 mg via ORAL
  Filled 2019-02-22 (×2): qty 1

## 2019-02-22 MED ORDER — NITROGLYCERIN IN D5W 200-5 MCG/ML-% IV SOLN
INTRAVENOUS | Status: AC
  Filled 2019-02-22: qty 250

## 2019-02-22 MED ORDER — CANGRELOR TETRASODIUM 50 MG IV SOLR
INTRAVENOUS | Status: AC
  Filled 2019-02-22: qty 50

## 2019-02-22 MED ORDER — PANTOPRAZOLE SODIUM 40 MG PO TBEC
40.0000 mg | DELAYED_RELEASE_TABLET | Freq: Every day | ORAL | Status: DC
  Administered 2019-02-22 – 2019-02-23 (×2): 40 mg via ORAL
  Filled 2019-02-22 (×3): qty 1

## 2019-02-22 MED ORDER — NITROGLYCERIN 1 MG/10 ML FOR IR/CATH LAB
INTRA_ARTERIAL | Status: AC
  Filled 2019-02-22: qty 10

## 2019-02-22 MED ORDER — SODIUM CHLORIDE 0.9 % IV SOLN: 250.0000 mL | INTRAVENOUS | Status: DC | PRN

## 2019-02-22 MED ORDER — FENTANYL CITRATE (PF) 100 MCG/2ML IJ SOLN
INTRAMUSCULAR | Status: DC | PRN
  Administered 2019-02-22 (×4): 25 ug via INTRAVENOUS

## 2019-02-22 MED ORDER — BENZONATATE 100 MG PO CAPS
200.0000 mg | ORAL_CAPSULE | Freq: Three times a day (TID) | ORAL | Status: DC | PRN
  Filled 2019-02-22: qty 2

## 2019-02-22 MED ORDER — CHLORHEXIDINE GLUCONATE CLOTH 2 % EX PADS
6.0000 | MEDICATED_PAD | Freq: Every day | CUTANEOUS | Status: DC
  Administered 2019-02-22: 20:00:00 6 via TOPICAL

## 2019-02-22 MED ORDER — ASPIRIN 81 MG PO CHEW: 81.0000 mg | CHEWABLE_TABLET | ORAL | Status: DC

## 2019-02-22 MED ORDER — ASPIRIN EC 81 MG PO TBEC
81.0000 mg | DELAYED_RELEASE_TABLET | Freq: Two times a day (BID) | ORAL | Status: DC
  Administered 2019-02-23: 81 mg via ORAL
  Filled 2019-02-22: qty 1

## 2019-02-22 MED ORDER — HEPARIN SODIUM (PORCINE) 1000 UNIT/ML IJ SOLN
INTRAMUSCULAR | Status: DC | PRN
  Administered 2019-02-22: 2000 [IU] via INTRAVENOUS
  Administered 2019-02-22: 3000 [IU] via INTRAVENOUS
  Administered 2019-02-22: 2000 [IU] via INTRAVENOUS
  Administered 2019-02-22: 3000 [IU] via INTRAVENOUS

## 2019-02-22 MED ORDER — SODIUM CHLORIDE 0.9% FLUSH: 3.0000 mL | Freq: Two times a day (BID) | INTRAVENOUS | Status: DC

## 2019-02-22 MED ORDER — IPRATROPIUM-ALBUTEROL 0.5-2.5 (3) MG/3ML IN SOLN
3.0000 mL | Freq: Four times a day (QID) | RESPIRATORY_TRACT | Status: DC
  Administered 2019-02-22 – 2019-02-23 (×2): 3 mL via RESPIRATORY_TRACT
  Filled 2019-02-22 (×3): qty 3

## 2019-02-22 MED ORDER — HYDRALAZINE HCL 20 MG/ML IJ SOLN: 10.0000 mg | INTRAMUSCULAR | Status: AC | PRN

## 2019-02-22 SURGICAL SUPPLY — 19 items
BALLN ~~LOC~~ EUPHORA RX 3.0X20 (BALLOONS) ×3
BALLOON ~~LOC~~ EUPHORA RX 3.0X20 (BALLOONS) IMPLANT
CATH 5F 110X4 TIG (CATHETERS) ×2 IMPLANT
CATH BALLN WEDGE 5F 110CM (CATHETERS) ×2 IMPLANT
CATH INFINITI 5FR ANG PIGTAIL (CATHETERS) ×2 IMPLANT
CATH LAUNCHER 5F JR4 (CATHETERS) ×2 IMPLANT
DEVICE INFLAT 30 PLUS (MISCELLANEOUS) ×2 IMPLANT
DEVICE RAD TR BAND REGULAR (VASCULAR PRODUCTS) ×2 IMPLANT
GLIDESHEATH SLEND SS 6F .021 (SHEATH) ×2 IMPLANT
KIT MANI 3VAL PERCEP (MISCELLANEOUS) ×3 IMPLANT
PACK CARDIAC CATH (CUSTOM PROCEDURE TRAY) ×3 IMPLANT
SHEATH GLIDE SLENDER 4/5FR (SHEATH) ×2 IMPLANT
STENT RESOLUTE ONYX 2.75X38 (Permanent Stent) ×2 IMPLANT
STENT RESOLUTE ONYX3.0X38 (Permanent Stent) ×2 IMPLANT
WIRE HI TORQ WHISPER MS 190CM (WIRE) ×2 IMPLANT
WIRE HITORQ VERSACORE ST 145CM (WIRE) ×2 IMPLANT
WIRE PRESSURE VERRATA (WIRE) ×2 IMPLANT
WIRE ROSEN-J .035X260CM (WIRE) ×2 IMPLANT
WIRE RUNTHROUGH .014X180CM (WIRE) ×2 IMPLANT

## 2019-02-22 NOTE — Interval H&P Note (Signed)
History and Physical Interval Note:  02/22/2019 9:03 AM  Kimberly Kemp  has presented today for surgery, with the diagnosis of chest pain and shortness of breath.  The various methods of treatment have been discussed with the patient and family. After consideration of risks, benefits and other options for treatment, the patient has consented to  Procedure(s): RIGHT/LEFT HEART CATH AND CORONARY ANGIOGRAPHY (N/A) as a surgical intervention.  The patient's history has been reviewed, patient examined, no change in status, stable for surgery.  I have reviewed the patient's chart and labs.  Questions were answered to the patient's satisfaction.    Cath Lab Visit (complete for each Cath Lab visit)  Clinical Evaluation Leading to the Procedure:   ACS: No.  Non-ACS:    Anginal Classification: CCS IV  Anti-ischemic medical therapy: Minimal Therapy (1 class of medications)  Non-Invasive Test Results: Low-risk stress test findings: cardiac mortality <1%/year  Prior CABG: No previous CABG  Kimberly Kemp

## 2019-02-22 NOTE — OR Nursing (Signed)
Pt a bit less restless, nausea improved and eating a bit of lunch, still chest pain free

## 2019-02-22 NOTE — Discharge Instructions (Signed)
**  PLEASE REMEMBER TO BRING ALL OF YOUR MEDICATIONS TO EACH OF YOUR FOLLOW-UP OFFICE VISITS. ° °NO HEAVY LIFTING OR SEXUAL ACTIVITY X 7 DAYS. °NO DRIVING X 3-5 DAYS. °NO SOAKING BATHS, HOT TUBS, POOLS, ETC., X 7 DAYS. ° °Radial Site Care °Refer to this sheet in the next few weeks. These instructions provide you with information on caring for yourself after your procedure. Your caregiver may also give you more specific instructions. Your treatment has been planned according to current medical practices, but problems sometimes occur. Call your caregiver if you have any problems or questions after your procedure. °HOME CARE INSTRUCTIONS °You may shower the day after the procedure. Remove the bandage (dressing) and gently wash the site with plain soap and water. Gently pat the site dry.  °Do not apply powder or lotion to the site.  °Do not submerge the affected site in water for 3 to 5 days.  °Inspect the site at least twice daily.  °Do not flex or bend the affected arm for 24 hours.  °No lifting over 5 pounds (2.3 kg) for 5 days after your procedure.  °Do not drive home if you are discharged the same day of the procedure. Have someone else drive you.  ° °What to expect: °Any bruising will usually fade within 1 to 2 weeks.  °Blood that collects in the tissue (hematoma) may be painful to the touch. It should usually decrease in size and tenderness within 1 to 2 weeks.  °SEEK IMMEDIATE MEDICAL CARE IF: °You have unusual pain at the radial site.  °You have redness, warmth, swelling, or pain at the radial site.  °You have drainage (other than a small amount of blood on the dressing).  °You have chills.  °You have a fever or persistent symptoms for more than 72 hours.  °You have a fever and your symptoms suddenly get worse.  °Your arm becomes pale, cool, tingly, or numb.  °You have heavy bleeding from the site. Hold pressure on the site.  °_____________  ° °  ° °10 Habits of Highly Healthy People ° °[place] wants to help  you get well and stay well.  Live a longer, healthier life by practicing healthy habits every day. ° °1.  Visit your primary care provider regularly. °2.  Make time for family and friends.  Healthy relationships are important. °3.  Take medications as directed by your provider. °4.  Maintain a healthy weight and a trim waistline. °5.  Eat healthy meals and snacks, rich in fruits, vegetables, whole grains, and lean proteins. °6.  Get moving every day - aim for 150 minutes of moderate physical activity each week. °7.  Don't smoke. °8.  Avoid alcohol or drink in moderation. °9.  Manage stress through meditation or mindful relaxation. °10.  Get seven to nine hours of quality sleep each night. ° °Want more information on healthy habits?  To learn more about these and other healthy habits, visit [email or web address]. °_____________ °   °

## 2019-02-22 NOTE — Discharge Summary (Addendum)
Discharge Summary    Patient ID: Kimberly Kemp MRN: [id number]; DOB: 10-21-1965  Admit date: 02/22/2019 Discharge date: 02/23/2019  Primary Care Provider: Martin Majestic, FNP  Primary Cardiologist: [name], MD   Discharge Diagnoses    Principal Problem:   Peri-procedural inferior STEMI  **s/p PCI/DES to the RCA this admission.  Active Problems:   CAD (coronary artery disease)   Essential hypertension   Hyperlipidemia LDL goal <70   Tobacco abuse   GERD (gastroesophageal reflux disease)   COPD exacerbation (HCC)   Generalized anxiety disorder   Palpitations  Diagnostic Studies/Procedures    Cardiac Catheterization and Percutaneous Coronary Intervention 1.19.2021  Left Main  Vessel is moderate in size. Vessel is angiographically normal.  Left Anterior Descending  Vessel is moderate in size. Vessel is angiographically normal. The vessel is moderately tortuous.  First Diagonal Branch  Vessel is small in size.  Second Diagonal Branch  Vessel is small in size.  Third Diagonal Branch  Vessel is small in size.  Left Circumflex  Vessel is moderate in size.  First Obtuse Marginal Branch  Vessel is moderate in size.  Second Obtuse Marginal Branch  Vessel is small in size.  Right Coronary Artery  Vessel is large.  Mid RCA lesion 65% stenosed  Mid RCA lesion is 65% stenosed.      **The proximal and mid RCA was successfully treated using a 3.0 x 58mm Resolute Onyx DES proximally, and a 2.75 x 30mm Resolute Onyx DES mid/distal.  Right Posterior Descending Artery  Vessel is moderate in size.  Right Posterior Atrioventricular Artery  Vessel is moderate in size.  RPAV lesion 0% stenosed  Non-stenotic RPAV lesion.   Right Heart Pressures RA (mean): 9 mmHg RV (S/EDP): 33/9 mmHg PA (S/D, mean): 26/13 (19) mmHg PCWP (mean): 13 mmHg  Ao sat: 100% PA sat: 81%  Fick CO: 7.8 L/min Fick CI: 4.9 L/min/m^2   Left Ventricle The left ventricular size is normal.  There is hyperdynamic left ventricular systolic function. LV end diastolic pressure is normal. The left ventricular ejection fraction is greater than 65% by visual estimate. No regional wall motion abnormalities.  _____________  2D Echocardiogram 1.20.21   1. Left ventricular ejection fraction, by visual estimation, is 55 to 60%. The left ventricle has normal function. There is no left ventricular hypertrophy.  2. The left ventricle demonstrates regional wall motion abnormalities.  3. Akinesis noted in the LV inferoseptal wall.  4. Global right ventricle has normal systolic function.The right ventricular size is normal. No increase in right ventricular wall thickness.  5. Left atrial size was normal.  6. Right atrial size was normal.  7. The mitral valve is normal in structure. No evidence of mitral valve regurgitation.  8. The tricuspid valve is normal in structure.  9. The tricuspid valve is normal in structure. Tricuspid valve regurgitation is not demonstrated. 10. The aortic valve is normal in structure. Aortic valve regurgitation is not visualized. 11. The pulmonic valve was not well visualized. Pulmonic valve regurgitation is not visualized. 12. Normal pulmonary artery systolic pressure. _____________    History of Present Illness     Kimberly Kemp is a 54 y.o. female with a h/o HTN, HL, prior stroke, palpitations (? PAF), ? H/o PE, tob abuse, COPD, colon cancer, hepatitis B, and anxiety.  She has a 6-12 month h/o atypical chest pain with prior exercise treadmill testing in 09/2018 notable for poor exercise tolerance.  This was followed by a dobutamine stress techo  in Oct 2020, which was a low risk study, showing nl LV function, and no evidence of wall motion abnormalities.  In December, in the setting of palpitations, event monitoring was carried out and showed rare PACs, PVCs, and rare, brief episodes of PSVT.  Patient triggered events corresponded to sinus rhythm.  She was recently  re-evaluated by Dr. Saunders Revel on Jan. 7th, with ongoing complaints of intermittent chest pain associated with dyspnea, lightheadedness, and palpitations.  Symptoms typically occur several time per week at rest, last 5-10 mins, and resolve spontaneously.  In light of ongoing symptoms with previous negative work-up, decision was made to pursue diagnostic catheterization for further evaluation.  Hospital Course     Consultants: None   Patient presented to the [facility] cardiac catheterization laboratory and underwent diagnostic catheterization revealing moderate proximal RCA disease and otherwise normal coronary arteries.   In order to assess the hemodynamic significance of the RCA disease, instantaneous-wave free ratio measurement was attempted, however, this resulted in dissection of the right coronary artery.  With this, she developed chest pain and inferior ST segment elevation.  The RCA was then successfully treated using 2 Resolute Onyx drug-eluting stents.  In the setting of dissection and stenting, the RPDA was jailed.  She was placed on intravenous nitroglycerin with improvement in chest pain and subsequently admitted and monitored in the intensive care unit.  Overnight, she did well and has had no recurrent chest discomfort this morning.  Nitroglycerin has been discontinued and she has ambulated without recurrent symptoms or limitations.  She has had no ectopy on telemetry.  Echocardiogram performed this AM continued to show normal LV function.  We have added low-dose beta-blocker and amlodipine therapy, along with dual antiplatelet therapy (aspirin and Brilinta).  She is very eager to go home and we have agreed to discharge her home later this afternoon.  She will follow-up in clinic in 1 week.  Did the patient have an acute coronary syndrome (MI, NSTEMI, STEMI, etc) this admission?:  Yes                               AHA/ACC Clinical Performance & Quality Measures: 1. Aspirin prescribed? - Yes 2. ADP  Receptor Inhibitor (Plavix/Clopidogrel, Brilinta/Ticagrelor or Effient/Prasugrel) prescribed (includes medically managed patients)? - Yes 3. Beta Blocker prescribed? - Yes 4. High Intensity Statin (Lipitor 40-80mg  or Crestor 20-40mg ) prescribed? - Yes 5. EF assessed during THIS hospitalization? - Yes 6. For EF <40%, was ACEI/ARB prescribed? - Not Applicable (EF >/= [id number]) 7. For EF <40%, Aldosterone Antagonist (Spironolactone or Eplerenone) prescribed? - Not Applicable (EF >/= [id number]) 8. Cardiac Rehab Phase II ordered (Included Medically managed Patients)? - Yes  _____________  Discharge Vitals Blood pressure (!) 159/77, pulse 74, temperature 98 F (36.7 C), temperature source Oral, resp. rate (!) 23, height 5\' 3"  (1.6 m), weight 57.9 kg, last menstrual period 07/18/2016, SpO2 98 %.  Filed Weights   02/22/19 0810 02/22/19 1939  Weight: 56.2 kg 57.9 kg    Labs & Radiologic Studies    CBC Recent Labs    02/21/19 1658 02/23/19 0510  WBC 10.4 13.5*  NEUTROABS 5.3  --   HGB 12.0 11.6*  HCT 35.5* 33.9*  MCV 91.0 90.6  PLT 218 [id number]   Basic Metabolic Panel Recent Labs    02/21/19 1658 02/23/19 0510  NA 141 140  K 3.8 3.8  CL 109 110  CO2 21* 23  GLUCOSE 96 129*  BUN 17 16  CREATININE 1.25* 0.90  CALCIUM 9.4 8.8*  _____________   Disposition   Pt is being discharged home today in good condition.  Follow-up Plans & Appointments    Follow-up Information    End, Harrell Gave, MD Follow up on 03/02/2019.   Specialty: Cardiology Why: 11:00 AM Contact information: [address] [place] [address] [phone]        [facility] Follow up.   Specialty: Cardiac Rehabilitation Why: Your Cardiologist has referred you to outpatient Cardiac Rehab at [facility].  The Cardiac Rehab department will contact you within one to two weeks of discharge to schedule y our first appointment.   Contact information: [place] [id number] [address] [place] [phone]         Discharge Instructions    AMB Referral to Cardiac Rehabilitation - Phase II   Complete by: As directed    Diagnosis: Coronary Stents   After initial evaluation and assessments completed: Virtual Based Care may be provided alone or in conjunction with Phase 2 Cardiac Rehab based on patient barriers.: Yes   Call MD for:  difficulty breathing, headache or visual disturbances   Complete by: As directed    Call MD for:  redness, tenderness, or signs of infection (pain, swelling, redness, odor or green/yellow discharge around incision site)   Complete by: As directed    Call MD for:  severe uncontrolled pain   Complete by: As directed    Diet - low sodium heart healthy   Complete by: As directed    Increase activity slowly   Complete by: As directed       Discharge Medications   Allergies as of 02/23/2019      Reactions   Penicillins Hives   Did it involve swelling of the face/tongue/throat, SOB, or low BP? No Did it involve sudden or severe rash/hives, skin peeling, or any reaction on the inside of your mouth or nose? Yes Did you need to seek medical attention at a hospital or doctor's office? Yes When did it last happen?10 Years If all above answers are "NO", may proceed with cephalosporin use.   Omnipaque [iohexol] Itching      Red Dye Itching      Medication List    STOP taking these medications   dextromethorphan-guaiFENesin 30-600 MG 12hr tablet Commonly known as: MUCINEX DM   diltiazem 120 MG 24 hr capsule Commonly known as: DILACOR XR   diphenhydrAMINE 25 MG tablet Commonly known as: BENADRYL   DULoxetine 20 MG capsule Commonly known as: CYMBALTA   meloxicam 15 MG tablet Commonly known as: MOBIC   meloxicam 7.5 MG tablet Commonly known as: MOBIC   predniSONE 50 MG tablet Commonly known as: DELTASONE     TAKE these medications   albuterol 108 (90 Base) MCG/ACT  inhaler Commonly known as: ProAir HFA INHALE 2 PUFFS INTO THE LUNGS EVERY FOUR HOURS AS NEEDED FOR WHEEZING What changed:   how much to take  how to take this  when to take this  reasons to take this  additional instructions   albuterol (2.5 MG/3ML) 0.083% nebulizer solution Commonly known as: PROVENTIL TAKE 3 MLS (2.5 MG TOTAL) BY NEBULIZATION 4 TIMES DAILY AS NEEDED FOR WHEEZING OR SHORTNESS OF BREATH What changed:   how much to take  how to take this  when to take this  additional instructions   amLODipine 2.5 MG tablet Commonly known as: NORVASC Take 1  tablet (2.5 mg total) by mouth daily.   aspirin EC 81 MG tablet Take 81 mg by mouth 2 (two) times daily.   benzonatate 200 MG capsule Commonly known as: TESSALON Take 200 mg by mouth 3 (three) times daily as needed for cough.   budesonide 0.25 MG/2ML nebulizer solution Commonly known as: PULMICORT TAKE 2 MLS (0.25 MG TOTAL) BY NEBULIZATION 2 (TWO) TIMES DAILY.   butalbital-acetaminophen-caffeine 50-325-40 MG tablet Commonly known as: FIORICET Take 1 tablet by mouth 3 (three) times daily as needed for headache.   famotidine 40 MG tablet Commonly known as: PEPCID Take 40 mg by mouth at bedtime.   FISH OIL PO Take 1 capsule by mouth 2 (two) times daily.   gabapentin 300 MG capsule Commonly known as: NEURONTIN Take 300 mg by mouth at bedtime.   ipratropium 0.02 % nebulizer solution Commonly known as: ATROVENT Take 0.5 mg by nebulization 4 (four) times daily.   magic mouthwash w/lidocaine Soln Take 10 mLs by mouth 4 (four) times daily as needed for mouth pain. What changed: when to take this   metoprolol tartrate 25 MG tablet Commonly known as: LOPRESSOR Take 0.5 tablets (12.5 mg total) by mouth 2 (two) times daily.   montelukast 10 MG tablet Commonly known as: SINGULAIR Take 10 mg by mouth daily.   omeprazole 40 MG capsule Commonly known as: PRILOSEC TAKE 1 CAPSULE BY MOUTH EVERY DAY What  changed:   how much to take  how to take this  when to take this  additional instructions   Pazeo 0.7 % Soln Generic drug: Olopatadine HCl Place 1 drop into both eyes daily.   promethazine 25 MG tablet Commonly known as: PHENERGAN Take 25 mg by mouth 4 (four) times daily as needed for nausea or vomiting.   promethazine-codeine 6.25-10 MG/5ML syrup Commonly known as: PHENERGAN with CODEINE Take 5 mLs by mouth every 6 (six) hours as needed for cough. What changed: when to take this   rosuvastatin 40 MG tablet Commonly known as: CRESTOR Take 1 tablet (40 mg total) by mouth daily.   ticagrelor 90 MG Tabs tablet Commonly known as: BRILINTA Take 1 tablet (90 mg total) by mouth 2 (two) times daily.        Outstanding Labs/Studies   None  Duration of Discharge Encounter   Greater than 30 minutes including physician time.  Signed, Murray Hodgkins, NP 02/23/2019, 1:28 PM

## 2019-02-22 NOTE — Progress Notes (Signed)
[facility] Post-PCI Note  Date: 02/22/19 Time: 6:52 PM  Subjective: Patient feels well, denying chest pain, shortness of breath, and nausea.  Restlessness has also resolved.  She remains on NTG infusion at 40 mcg/min.  Objective: Temp:  [98.1 F (36.7 C)] 98.1 F (36.7 C) (01/19 0810) Pulse Rate:  [66-88] 84 (01/19 1830) Resp:  [13-23] 15 (01/19 1830) BP: (114-144)/(63-93) 119/63 (01/19 1830) SpO2:  [97 %-100 %] 98 % (01/19 1830) Weight:  [56.2 kg] 56.2 kg (01/19 0810) Gen: lying in bed comfortably. Ext: Soft with mild bruising proximal to right radial arteriotomy.  Assessment/Plan: Pt is s/p diagnostic cath revealing moderate stenosis in the mid RCA.  Hemodynamic assessment with iFR was attempted by was complicated by wire dissection leading to abrupt closure of rPDA and small RV marginal branches.  RCA was treated with long stented segment from ostium through distal vessel, jailing PDA (TIMI-0 flow).  Chest pain and nausea were managed with NTG, Zofran, and Phenergan with resolution of symptoms.  We will wean NTG infusion as tolerated.  I will try a small dose of metoprolol tartrate, though we will need to be mindful of her underlying COPD.  Patient is awaiting transfer to stepdown tomorrow.  Echo planned for tomorrow to reassess LV function.  [name], MD [facility]

## 2019-02-22 NOTE — OR Nursing (Signed)
Chest pain responded to increase of nitroglycerin infusion with result of 0/10 and IV phenergan given for nausea.

## 2019-02-22 NOTE — OR Nursing (Signed)
Pt returned from cath lab post PCI, pt complaining of 6/10 chest pain (much better) but most of her complaints and focus was on right antecubital venous sheath. Iv started on left hand and removed right antecubital venous sheath. Nitroglycerin titrated up to 28mcg/min. Pt reports nausea, Dr End notified. Phenergan IV orderd to come up from pharmacy.

## 2019-02-22 NOTE — Telephone Encounter (Signed)
   Receive page from Answering Service that patient is running late for right/left heart catheterization this morning at 7:30am with Dr. Saunders Revel. Tried calling patient but she did not answer and was unable to leave a voicemail. I also tried to call [facility] cath lab to notify them but did not get an answer. However, I sent a message to Dr. Saunders Revel.  Darreld Mclean, PA-C 02/22/2019 6:52 AM

## 2019-02-22 NOTE — OR Nursing (Signed)
Pt restless noted small hematoma proximal to TR band manual hold done, brief 1 ml air removed and quickly returned when scant blood noted. Dr end and cath lab staff Audrea Muscat assessed site. reinstructed pt need to keep right hand immobilized.Pt reporting some remaining nausea despite phenergan. Pt reports she is hoping that the medication which is causing her extreme restlessness wears off soon. Pt instructed that she will be spending night in hospital.

## 2019-02-23 ENCOUNTER — Encounter: Payer: Self-pay | Admitting: Cardiology

## 2019-02-23 ENCOUNTER — Inpatient Hospital Stay (HOSPITAL_COMMUNITY)
Admission: AD | Admit: 2019-02-23 | Discharge: 2019-02-23 | Disposition: A | Discharge status: Home / Self Care | Payer: Medicare Other | Source: Home / Self Care | Attending: Internal Medicine | Admitting: Internal Medicine

## 2019-02-23 DIAGNOSIS — I200 Unstable angina: Secondary | ICD-10-CM

## 2019-02-23 DIAGNOSIS — I2119 ST elevation (STEMI) myocardial infarction involving other coronary artery of inferior wall: Secondary | ICD-10-CM

## 2019-02-23 DIAGNOSIS — R079 Chest pain, unspecified: Secondary | ICD-10-CM

## 2019-02-23 LAB — CBC
HCT: 33.9 % — ABNORMAL LOW (ref 36.0–46.0)
Hemoglobin: 11.6 g/dL — ABNORMAL LOW (ref 12.0–15.0)
MCH: 31 pg (ref 26.0–34.0)
MCHC: 34.2 g/dL (ref 30.0–36.0)
MCV: 90.6 fL (ref 80.0–100.0)
Platelets: 190 10*3/uL (ref 150–400)
RBC: 3.74 MIL/uL — ABNORMAL LOW (ref 3.87–5.11)
RDW: 13.9 % (ref 11.5–15.5)
WBC: 13.5 10*3/uL — ABNORMAL HIGH (ref 4.0–10.5)
nRBC: 0 % (ref 0.0–0.2)

## 2019-02-23 LAB — BASIC METABOLIC PANEL
Anion gap: 7 (ref 5–15)
BUN: 16 mg/dL (ref 6–20)
CO2: 23 mmol/L (ref 22–32)
Calcium: 8.8 mg/dL — ABNORMAL LOW (ref 8.9–10.3)
Chloride: 110 mmol/L (ref 98–111)
Creatinine, Ser: 0.9 mg/dL (ref 0.44–1.00)
GFR calc Af Amer: >60 mL/min (ref >60)
GFR calc non Af Amer: >60 mL/min (ref >60)
Glucose, Bld: 129 mg/dL — ABNORMAL HIGH (ref 70–99)
Potassium: 3.8 mmol/L (ref 3.5–5.1)
Sodium: 140 mmol/L (ref 135–145)

## 2019-02-23 LAB — ECHOCARDIOGRAM COMPLETE
Height: 63 in
Weight: 2042.34 oz

## 2019-02-23 MED ORDER — METOPROLOL TARTRATE 25 MG PO TABS: 12.5000 mg | ORAL_TABLET | Freq: Two times a day (BID) | ORAL | 6 refills | Status: DC

## 2019-02-23 MED ORDER — AMLODIPINE BESYLATE 2.5 MG PO TABS: 2.5000 mg | ORAL_TABLET | Freq: Every day | ORAL | 6 refills | Status: DC

## 2019-02-23 MED ORDER — PERFLUTREN LIPID MICROSPHERE
1.0000 mL | INTRAVENOUS | Status: AC | PRN
  Administered 2019-02-23: 3 mL via INTRAVENOUS
  Filled 2019-02-23: qty 10

## 2019-02-23 MED ORDER — TICAGRELOR 90 MG PO TABS: 90.0000 mg | ORAL_TABLET | Freq: Two times a day (BID) | ORAL | 6 refills | Status: DC

## 2019-02-23 MED ORDER — AMLODIPINE BESYLATE 5 MG PO TABS
2.5000 mg | ORAL_TABLET | Freq: Every day | ORAL | Status: DC
  Administered 2019-02-23: 13:00:00 2.5 mg via ORAL
  Filled 2019-02-23: qty 1

## 2019-02-23 NOTE — Progress Notes (Signed)
*  PRELIMINARY RESULTS* Echocardiogram 2D Echocardiogram has been performed.  Kimberly Kemp 02/23/2019, 10:23 AM

## 2019-02-23 NOTE — Progress Notes (Signed)
Patient discharge by wheelchair- IV, heart monitor discontinued.   Discharge instructions given to patient.

## 2019-02-23 NOTE — Progress Notes (Signed)
Progress Note  Patient Name: Kimberly Kemp Date of Encounter: 02/23/2019  Primary Cardiologist: [name], MD   Subjective   No chest pain, shortness of breath, or edema.  Breathing is actually better than pre-cath/PCI.  Right wrist is sore.  Inpatient Medications    Scheduled Meds: . aspirin EC  81 mg Oral BID  . budesonide  0.25 mg Nebulization BID  . Chlorhexidine Gluconate Cloth  6 each Topical Daily  . enoxaparin (LOVENOX) injection  40 mg Subcutaneous Q24H  . famotidine  40 mg Oral QHS  . gabapentin  300 mg Oral QHS  . ipratropium-albuterol  3 mL Nebulization Q6H  . metoprolol tartrate  12.5 mg Oral BID  . montelukast  10 mg Oral Daily  . olopatadine  1 drop Both Eyes Daily  . pantoprazole  40 mg Oral Daily  . rosuvastatin  40 mg Oral Daily  . sodium chloride flush  3 mL Intravenous Q12H  . ticagrelor  90 mg Oral BID   Continuous Infusions: . sodium chloride    . nitroGLYCERIN Stopped (02/22/19 2132)   PRN Meds: sodium chloride, acetaminophen, benzonatate, butalbital-acetaminophen-caffeine, ondansetron (ZOFRAN) IV, promethazine, sodium chloride flush   Vital Signs    Vitals:   02/22/19 2000 02/22/19 2034 02/23/19 0100 02/23/19 0500  BP: 136/71     Pulse: 87     Resp: (!) 22     Temp:   98.3 F (36.8 C) 98.4 F (36.9 C)  TempSrc:   Oral Oral  SpO2: 100% 100%    Weight:      Height:        Intake/Output Summary (Last 24 hours) at 02/23/2019 [id number] Last data filed at 02/23/2019 0600 Gross per 24 hour  Intake 119.19 ml  Output 750 ml  Net -630.81 ml   Last 3 Weights 02/22/2019 02/22/2019 02/10/2019  Weight (lbs) 127 lb 10.3 oz 124 lb 129 lb  Weight (kg) 57.9 kg 56.246 kg 58.514 kg      Telemetry    NSR - Personally Reviewed  ECG    NSR with inferior ST elevation, less pronounced than on yesterday's tracing - Personally Reviewed  Physical Exam   GEN: No acute distress.   Neck: No JVD Cardiac: RRR, no murmurs, rubs, or gallops.   Respiratory: Clear to auscultation bilaterally. GI: Soft, nontender, non-distended  MS: No edema; No deformity.  Mild bruising at right radial cath site without hematoma.  2+ right radial pulse with normal capillary refill in the right hand. Neuro:  Nonfocal  Psych: Normal affect   Labs    High Sensitivity Troponin:  No results for input(s): TROPONINIHS in the last 720 hours.    Chemistry Recent Labs  Lab 02/21/19 1658 02/23/19 0510  NA 141 140  K 3.8 3.8  CL 109 110  CO2 21* 23  GLUCOSE 96 129*  BUN 17 16  CREATININE 1.25* 0.90  CALCIUM 9.4 8.8*  GFRNONAA 49* >60  GFRAA 57* >60  ANIONGAP 11 7     Hematology Recent Labs  Lab 02/21/19 1658 02/23/19 0510  WBC 10.4 13.5*  RBC 3.90 3.74*  HGB 12.0 11.6*  HCT 35.5* 33.9*  MCV 91.0 90.6  MCH 30.8 31.0  MCHC 33.8 34.2  RDW 13.6 13.9  PLT 218 190    BNPNo results for input(s): BNP, PROBNP in the last 168 hours.   DDimer No results for input(s): DDIMER in the last 168 hours.   Radiology    CARDIAC CATHETERIZATION  Result Date:  02/22/2019 Conclusions: 1. Single vessel coronary artery disease with 60-70% mid RCA stenosis.  Attempted iFR of this lesion led to wire dissection extending from the ostial through the distal RCA and occlusion of PDA.  RCA dissection was treated with overlapping Resolute Onyx 2.75 x 38 mm and 3.0 x 38 mm drug-eluting stents (post-dilated to 3.1 mm). 2. No significant disease involving the left coronary artery. 3. Normal left heart filling pressures. 4. Mildly elevated right heart filling pressures. 5. Normal pulmonary artery pressure with mild transpulmonic gradient. 6. Supranormal Fick cardiac output/index. Recommendations: 1. Admit to ICU for post-PCI management of chest pain due to occluded rPDA.  Titrate NTG infusion as blood pressure tolerates for relief of chest pain. 2. Dual antiplatelet therapy with aspirin and ticagrelor for at least 12 months, ideally longer given long stented segment of  the RCA. 3. Aggressive secondary prevention, including high-intensity statin therapy and follow-up with the lipid clinic after discharge. [name], MD [facility]    Cardiac Studies   See cath report above  Patient Profile     54 y.o. female history of hypertension, hyperlipidemia, stroke, questionable atrial fibrillation, questionable pulmonary embolism, COPD, colon cancer, hepatitis B, anxiety, and tobacco abuse, admitted following diagnostic catheterization complicated by RCA dissection requiring PCI and occlusion of PDA.  Assessment & Plan    Inferior STEMI: Patient feels well with resolution of chest pain.  Breathing is actually better than baseline.  Nitroglycerin infusion has been weaned off.  EKG this morning shows persistent but improved inferior ST elevation.  No arrhythmia noted on telemetry.  Follow-up echocardiogram to reassess LVEF.  Increase metoprolol tartrate to 25 mg twice daily.  Patient ambulate to evaluate for angina or dyspnea.  Okay to transfer to telemetry today.  Continue dual antiplatelet therapy with aspirin and ticagrelor for at least 12 months, ideally longer.  Hypertension: Blood pressure elevated since weaning of nitroglycerin.  Increase metoprolol tartrate to 25 mg twice daily.  If LVEF is decreased, we will need to consider adding ACE inhibitor or ARB.  Alternatively, if EF remains preserved, amlodipine might be a good option for blood pressure control and antianginal benefits.  Familial hyperlipidemia:  Continue rosuvastatin 40 mg daily with close follow-up in the lipid clinic.  COPD:  Continue home regimen.  Continue to encourage smoking cessation.  Disposition:  Transfer to telemetry.  Reevaluate this afternoon after ambulation and completion of echocardiogram to determine discharge home today versus tomorrow.  For questions or updates, please contact [name] Please consult [email or web address] for contact info under  [name].  Signed, [name], MD  02/23/2019, 7:22 AM

## 2019-02-23 NOTE — Progress Notes (Signed)
Patient ambulated around the unit once- no complaints of shortness of breath- of increased heart rate. Patient now sitting in chair.

## 2019-02-24 ENCOUNTER — Telehealth: Payer: Self-pay | Admitting: Internal Medicine

## 2019-02-24 NOTE — Telephone Encounter (Signed)
I contacted Kimberly Kemp by phone for follow-up regarding hospitalization earlier this week in the setting of PCI complicated by iatrogenic occlusion of the PDA.  She feels well without chest pain or shortness of breath.  Right forearm swelling at right radial catheterization site is improving.  She still has some soreness in that area.  She was able to get her medications from the pharmacy and has been taking them as prescribed.  She does not have any further questions or concerns.  We will follow-up as planned in the office on 03/02/2019.  [name], MD [facility]

## 2019-02-25 ENCOUNTER — Telehealth: Payer: Self-pay | Admitting: Internal Medicine

## 2019-02-25 NOTE — Telephone Encounter (Signed)
Patient called in reporting that dressing to radial site was coming off and she wanted to see if she should recover it. Advised to assess site and reviewed all discharge instructions regarding care and monitoring that area. She verbalized understanding of our conversation with no further questions at this time.

## 2019-03-01 ENCOUNTER — Other Ambulatory Visit: Payer: Self-pay

## 2019-03-01 NOTE — Patient Outreach (Signed)
Red Emmi Alert: Date of call to patient: 02/28/2019 Reason for alert:  Lost interest in doing things: yes  Placed call to patient and reviewed reason for call. Patient reports she did not answer yes to the alert. But she noted that it was recorded as yes.  Patient denies depression or any lost of interest in doing things. Patient denies any other concerns today. Reports she is doing great!  PLAN: close case as no needs identified.  Tomasa Rand, RN, BSN, CEN [facility] [employer] [phone]

## 2019-03-02 ENCOUNTER — Encounter: Payer: Self-pay | Admitting: Internal Medicine

## 2019-03-02 ENCOUNTER — Other Ambulatory Visit: Payer: Self-pay

## 2019-03-02 ENCOUNTER — Ambulatory Visit (INDEPENDENT_AMBULATORY_CARE_PROVIDER_SITE_OTHER): Payer: Medicare Other | Admitting: Internal Medicine

## 2019-03-02 VITALS — BP 100/62 | HR 78 | Ht 63.0 in | Wt 128.5 lb

## 2019-03-02 DIAGNOSIS — I255 Ischemic cardiomyopathy: Secondary | ICD-10-CM | POA: Diagnosis not present

## 2019-03-02 DIAGNOSIS — I2510 Atherosclerotic heart disease of native coronary artery without angina pectoris: Secondary | ICD-10-CM

## 2019-03-02 DIAGNOSIS — I10 Essential (primary) hypertension: Secondary | ICD-10-CM | POA: Diagnosis not present

## 2019-03-02 DIAGNOSIS — Z720 Tobacco use: Secondary | ICD-10-CM

## 2019-03-02 DIAGNOSIS — E7801 Familial hypercholesterolemia: Secondary | ICD-10-CM | POA: Insufficient documentation

## 2019-03-02 NOTE — Patient Instructions (Signed)
I have sent a message to cardiac rehab nurse to let them know you are ready to begin. If you do not hear anything from them please call.  [phone]  Medication Instructions:  Your physician has recommended you make the following change in your medication:  1- STOP Amlodipine.  *If you need a refill on your cardiac medications before your next appointment, please call your pharmacy*  Lab Work: none If you have labs (blood work) drawn today and your tests are completely normal, you will receive your results only by: Marland Kitchen MyChart Message (if you have MyChart) OR . A paper copy in the mail If you have any lab test that is abnormal or we need to change your treatment, we will call you to review the results.  Testing/Procedures: none  Follow-Up: At [facility], you and your health needs are our priority.  As part of our continuing mission to provide you with exceptional heart care, we have created designated Provider Care Teams.  These Care Teams include your primary Cardiologist (physician) and Advanced Practice Providers (APPs -  Physician Assistants and Nurse Practitioners) who all work together to provide you with the care you need, when you need it.  Your next appointment:   4-6 week(s) with Dr End or APP  The format for your next appointment:   In Person  Provider:    You may see [name], MD or one of the following Advanced Practice Providers on your designated Care Team:    Murray Hodgkins, NP  Christell Faith, PA-C  Marrianne Mood, PA-C

## 2019-03-02 NOTE — Progress Notes (Signed)
Follow-up Outpatient Visit Date: 03/02/2019  Primary Care Provider: Martin Majestic, FNP [place] [address]  Chief Complaint: Follow-up coronary artery disease  HPI:  Kimberly Kemp is a 54 y.o. female with history of coronary artery disease status post PCI to the RCA (02/22/2019), ischemic cardiomyopathy, hypertension, familial hyperlipidemia, stroke, questionable paroxysmal atrial fibrillation, questionable pulmonary embolism, COPD, colon cancer, hepatitis B, anxiety, and tobacco abuse, who presents for follow-up of coronary artery disease and ischemic cardiomyopathy.  Kimberly Kemp underwent catheterization last week for evaluation of persistent shortness of breath and chest pain.  She was noted to have moderate to severe mid RCA stenosis.  During attempted hemodynamic assessment by IFR, wire dissection of the RCA occurred and stenting of the ostial through distal RCA was required.  The RPDA was jailed with resultant loss of flow.  Patient was managed medically and reported improvement in her chest pain and shortness of breath compared to before her PCI at the time of discharge.  LVEF was noted to be normal with focal inferoseptal akinesis.  Today, Kimberly Kemp reports that she is feeling quite well with improved breathing compared with before catheterization.  She has not had any further chest pain.  She reports mild soreness in her right forearm as well as continued bruising.  She does not have any pain, weakness, or numbness in the right hand.  She is tolerating her medications well including aspirin and ticagrelor.  She has noticed a little bit of lightheadedness at times.  She has cut down on smoking (currently 3 cigarettes/day).  --------------------------------------------------------------------------------------------------  Cardiovascular History & Procedures: Cardiovascular Problems:  Coronary artery disease s/p PCI to RCA (02/2019)  Paroxysmal atrial  fibrillation(questionable; no objective documentation in the patient's chart)  Pulmonary embolism(questionable; no objective documentation in the patient's chart)  Shortness of breath  Risk Factors:  Prior stroke, hypertension, hyperlipidemia, and tobacco use  Cath/PCI:  RHC/LHC and PCI (02/22/2019): LMCA normal.  LAD normal.  LCx normal.  Dominant RCA with 60 to 70% mid vessel stenosis.  RA 9, RV 33/9, PA 26/13, PCWP 13, Fick CO/CI 7.8/4.9.  LVEDP 15.  Attempted IFR of mid RCA complicated by guide wire dissection necessitating stenting of the ostial through distal RCA with overlapping Resolute Onyx stents (2.75 x 38 and 3.0 x 38 mm).  RCA was jailed with loss of flow.  CV Surgery:  None  EP Procedures and Devices:  14-day event monitor (01/05/2019): Predominantly sinus rhythm with rare PAC's and PVC's, as well as rare episodes of brief PSVT. Patient triggered events correspond to sinus rhythm.  Non-Invasive Evaluation(s):  TTE (02/23/2019): Normal LV size and wall thickness.  LVEF 55-60% with inferoseptal akinesis.  Normal RV size and function.  No significant valvular abnormality.  Normal PA pressure.  Dobutamine stress echocardiogram (11/16/2018): Low risk study with normal baseline LVEF and no evidence of inducible wall motion abnormality. LVEF 55-60%.  Exercise tolerance test (09/20/2018): Low to intermediate risk study with decreased exercise capacity and significant motion artifact.  Recent CV Pertinent Labs: Lab Results  Component Value Date   CHOL 305 (H) 09/22/2015   HDL 83 09/22/2015   LDLCALC 174 (H) 09/22/2015   TRIG 241 (H) 09/22/2015   CHOLHDL 3.7 09/22/2015   K 3.8 02/23/2019   BUN 16 02/23/2019   CREATININE 0.90 02/23/2019    Past medical and surgical history were reviewed and updated in EPIC.  Current Meds  Medication Sig  . albuterol (PROAIR HFA) 108 (90 Base) MCG/ACT inhaler  INHALE 2 PUFFS INTO THE LUNGS EVERY FOUR HOURS AS NEEDED FOR WHEEZING  (Patient taking differently: Inhale 2 puffs into the lungs every 4 (four) hours as needed for wheezing or shortness of breath. )  . albuterol (PROVENTIL) (2.5 MG/3ML) 0.083% nebulizer solution TAKE 3 MLS (2.5 MG TOTAL) BY NEBULIZATION 4 TIMES DAILY AS NEEDED FOR WHEEZING OR SHORTNESS OF BREATH (Patient taking differently: Take 2.5 mg by nebulization 4 (four) times daily. )  . aspirin EC 81 MG tablet Take 81 mg by mouth 2 (two) times daily.   . benzonatate (TESSALON) 200 MG capsule Take 200 mg by mouth 3 (three) times daily as needed for cough.  . budesonide (PULMICORT) 0.25 MG/2ML nebulizer solution TAKE 2 MLS (0.25 MG TOTAL) BY NEBULIZATION 2 (TWO) TIMES DAILY.  . butalbital-acetaminophen-caffeine (FIORICET, ESGIC) 50-325-40 MG tablet Take 1 tablet by mouth 3 (three) times daily as needed for headache.  . famotidine (PEPCID) 40 MG tablet Take 40 mg by mouth at bedtime.  . gabapentin (NEURONTIN) 300 MG capsule Take 300 mg by mouth at bedtime.  Marland Kitchen ipratropium (ATROVENT) 0.02 % nebulizer solution Take 0.5 mg by nebulization 4 (four) times daily.  . magic mouthwash w/lidocaine SOLN Take 10 mLs by mouth 4 (four) times daily as needed for mouth pain. (Patient taking differently: Take 10 mLs by mouth 2 (two) times daily. )  . metoprolol tartrate (LOPRESSOR) 25 MG tablet Take 0.5 tablets (12.5 mg total) by mouth 2 (two) times daily.  . montelukast (SINGULAIR) 10 MG tablet Take 10 mg by mouth daily.  . Omega-3 Fatty Acids (FISH OIL PO) Take 1 capsule by mouth 2 (two) times daily.  Marland Kitchen omeprazole (PRILOSEC) 40 MG capsule TAKE 1 CAPSULE BY MOUTH EVERY DAY (Patient taking differently: Take 40 mg by mouth 2 (two) times daily. )  . PAZEO 0.7 % SOLN Place 1 drop into both eyes daily.  . promethazine (PHENERGAN) 25 MG tablet Take 25 mg by mouth 4 (four) times daily as needed for nausea or vomiting.   . promethazine-codeine (PHENERGAN WITH CODEINE) 6.25-10 MG/5ML syrup Take 5 mLs by mouth every 6 (six) hours as needed  for cough. (Patient taking differently: Take 5 mLs by mouth at bedtime as needed for cough. )  . rosuvastatin (CRESTOR) 40 MG tablet Take 1 tablet (40 mg total) by mouth daily.  . ticagrelor (BRILINTA) 90 MG TABS tablet Take 1 tablet (90 mg total) by mouth 2 (two) times daily.  . [DISCONTINUED] amLODipine (NORVASC) 2.5 MG tablet Take 1 tablet (2.5 mg total) by mouth daily.    Allergies: Penicillins, Omnipaque [iohexol], and Red dye  Social History   Tobacco Use  . Smoking status: Current Every Day Smoker    Packs/day: 0.20    Years: 43.00    Pack years: 8.60    Types: Cigarettes  . Smokeless tobacco: Never Used  . Tobacco comment: cutting back  Substance Use Topics  . Alcohol use: No    Alcohol/week: 0.0 standard drinks  . Drug use: No    Family History  Problem Relation Age of Onset  . Emphysema Father   . Prostate cancer Father   . Liver disease Father   . Emphysema Maternal Grandmother   . Diabetes Mother   . Heart disease Mother 85       CABG and valve replacement  . Colon cancer Neg Hx     Review of Systems: A 12-system review of systems was performed and was negative except as noted in the HPI.  --------------------------------------------------------------------------------------------------  Physical Exam: BP 100/62 (BP Location: Left Arm, Patient Position: Sitting, Cuff Size: Normal)   Pulse 78   Ht 5\' 3"  (1.6 m)   Wt 128 lb 8 oz (58.3 kg)   LMP 07/18/2016   BMI 22.76 kg/m   General: NAD. HEENT: No conjunctival pallor or scleral icterus. Facemask in place. Neck: Supple without lymphadenopathy, thyromegaly, JVD, or HJR. Lungs: Normal work of breathing. Clear to auscultation bilaterally without wheezes or crackles. Heart: Regular rate and rhythm without murmurs, rubs, or gallops. Non-displaced PMI. Abd: Bowel sounds present. Soft, NT/ND without hepatosplenomegaly Ext: No lower extremity edema.  2+ radial pulses bilaterally.  Bruising of the right forearm  noted without hematoma.  Right hand capillary refill is 2+. Skin: Warm and dry without rash.  EKG: Normal sinus rhythm with possible left atrial enlargement and inferior Q waves.  Inferior ST elevations have resolved since 02/23/19.  Lab Results  Component Value Date   WBC 13.5 (H) 02/23/2019   HGB 11.6 (L) 02/23/2019   HCT 33.9 (L) 02/23/2019   MCV 90.6 02/23/2019   PLT 190 02/23/2019    Lab Results  Component Value Date   NA 140 02/23/2019   K 3.8 02/23/2019   CL 110 02/23/2019   CO2 23 02/23/2019   BUN 16 02/23/2019   CREATININE 0.90 02/23/2019   GLUCOSE 129 (H) 02/23/2019   ALT 25 05/06/2018    Lab Results  Component Value Date   CHOL 305 (H) 09/22/2015   HDL 83 09/22/2015   LDLCALC 174 (H) 09/22/2015   TRIG 241 (H) 09/22/2015   CHOLHDL 3.7 09/22/2015    --------------------------------------------------------------------------------------------------  ASSESSMENT AND PLAN: Coronary artery disease: Kimberly Kemp is feeling better than before her PCI to the mid RCA that was complicated by inferior STEMI secondary to RCA wire dissection and occlusion of the rPDA.  We will continue dual antiplatelet therapy with aspirin and ticagrelor for at least 12 months as well as low-dose metoprolol.  Given the lack of chest pain and soft blood pressure with intermittent lightheadedness, I will discontinue continue aggressive secondary prevention with lipid therapy as directed by Dr. Debara Pickett.  Ischemic cardiomyopathy: LVEF normal with inferoseptal akinesis on echo status post recent PCI.  Continue metoprolol tartrate 12.5 mg twice daily.  Familial hypercholesterolemia: Continue rosuvastatin 40 mg daily with further management per Dr. Debara Pickett in the lipid clinic.  Hypertension: Blood pressure low normal today.  I will discontinue amlodipine.  Continue metoprolol tartrate 12.5 mg twice daily.  Tobacco use: I congratulated Kimberly Kemp on her reduced smoking.  I encouraged her to quit  altogether.  Follow-up: Return to clinic in 4-6 weeks.  [name], MD 03/02/2019 9:51 PM

## 2019-03-07 ENCOUNTER — Other Ambulatory Visit: Payer: Self-pay

## 2019-03-07 ENCOUNTER — Encounter: Payer: Medicare Other | Attending: Internal Medicine | Admitting: *Deleted

## 2019-03-07 DIAGNOSIS — Z955 Presence of coronary angioplasty implant and graft: Secondary | ICD-10-CM | POA: Insufficient documentation

## 2019-03-07 DIAGNOSIS — F17210 Nicotine dependence, cigarettes, uncomplicated: Secondary | ICD-10-CM | POA: Insufficient documentation

## 2019-03-07 DIAGNOSIS — E785 Hyperlipidemia, unspecified: Secondary | ICD-10-CM | POA: Insufficient documentation

## 2019-03-07 DIAGNOSIS — Z7982 Long term (current) use of aspirin: Secondary | ICD-10-CM | POA: Insufficient documentation

## 2019-03-07 DIAGNOSIS — K219 Gastro-esophageal reflux disease without esophagitis: Secondary | ICD-10-CM | POA: Insufficient documentation

## 2019-03-07 DIAGNOSIS — Z8673 Personal history of transient ischemic attack (TIA), and cerebral infarction without residual deficits: Secondary | ICD-10-CM | POA: Insufficient documentation

## 2019-03-07 DIAGNOSIS — I10 Essential (primary) hypertension: Secondary | ICD-10-CM | POA: Insufficient documentation

## 2019-03-07 DIAGNOSIS — I2510 Atherosclerotic heart disease of native coronary artery without angina pectoris: Secondary | ICD-10-CM | POA: Insufficient documentation

## 2019-03-07 DIAGNOSIS — Z7901 Long term (current) use of anticoagulants: Secondary | ICD-10-CM | POA: Insufficient documentation

## 2019-03-07 DIAGNOSIS — J439 Emphysema, unspecified: Secondary | ICD-10-CM | POA: Insufficient documentation

## 2019-03-07 DIAGNOSIS — Z79899 Other long term (current) drug therapy: Secondary | ICD-10-CM | POA: Insufficient documentation

## 2019-03-07 NOTE — Progress Notes (Signed)
Initial phone orientation completed. Diagnosis can be found in [facility] 1/19. EP Orientation scheduled for 2/8

## 2019-03-08 ENCOUNTER — Other Ambulatory Visit: Payer: Self-pay | Admitting: Pulmonary Disease

## 2019-03-09 ENCOUNTER — Ambulatory Visit: Payer: Medicare Other | Admitting: Internal Medicine

## 2019-03-11 ENCOUNTER — Telehealth: Payer: Self-pay | Admitting: Internal Medicine

## 2019-03-11 NOTE — Telephone Encounter (Signed)
Patient had stent procedure on 1/19 and is calling in with concerns of a knot on her elbow. Patient states knot has been there two weeks and is increasing in size. The knot is painful and when patient bends her arm, it goes numb  Please advise when able

## 2019-03-11 NOTE — Telephone Encounter (Signed)
Spoke with the patient. Patient sts that her right radial cath site has a knot that has grown in size and  is painful. The site is dry and intact, patient denies any sign of infection. The knot is grape sized.. Her cardiaccath was performed on 02/22/19.  Discussed with our office DOD Dr. Garen Lah. The patient should be scheduled to be seen next week and instructed to report to the ED if symptoms worsen.  Appt schedule with Laurann Montana, NP on 03/14/19 @ 11am. Advised the patient that if symptoms worsen she is to report to the ED to be evaluated sooner.  Patient verbalized understanding and voiced appreciation for the call back.

## 2019-03-14 ENCOUNTER — Encounter: Payer: Self-pay | Admitting: Family

## 2019-03-14 ENCOUNTER — Other Ambulatory Visit: Payer: Self-pay

## 2019-03-14 ENCOUNTER — Encounter: Payer: Medicare Other | Admitting: *Deleted

## 2019-03-14 ENCOUNTER — Ambulatory Visit (INDEPENDENT_AMBULATORY_CARE_PROVIDER_SITE_OTHER): Payer: Medicare Other | Admitting: Family

## 2019-03-14 VITALS — BP 110/70 | HR 82 | Ht 63.0 in | Wt 130.8 lb

## 2019-03-14 VITALS — Ht 63.5 in | Wt 130.4 lb

## 2019-03-14 DIAGNOSIS — Z79899 Other long term (current) drug therapy: Secondary | ICD-10-CM | POA: Diagnosis not present

## 2019-03-14 DIAGNOSIS — I255 Ischemic cardiomyopathy: Secondary | ICD-10-CM

## 2019-03-14 DIAGNOSIS — K219 Gastro-esophageal reflux disease without esophagitis: Secondary | ICD-10-CM | POA: Diagnosis not present

## 2019-03-14 DIAGNOSIS — I10 Essential (primary) hypertension: Secondary | ICD-10-CM | POA: Diagnosis not present

## 2019-03-14 DIAGNOSIS — I2510 Atherosclerotic heart disease of native coronary artery without angina pectoris: Secondary | ICD-10-CM

## 2019-03-14 DIAGNOSIS — E785 Hyperlipidemia, unspecified: Secondary | ICD-10-CM | POA: Diagnosis not present

## 2019-03-14 DIAGNOSIS — M79602 Pain in left arm: Secondary | ICD-10-CM

## 2019-03-14 DIAGNOSIS — Z720 Tobacco use: Secondary | ICD-10-CM

## 2019-03-14 DIAGNOSIS — J439 Emphysema, unspecified: Secondary | ICD-10-CM | POA: Diagnosis not present

## 2019-03-14 DIAGNOSIS — E7801 Familial hypercholesterolemia: Secondary | ICD-10-CM

## 2019-03-14 DIAGNOSIS — Z955 Presence of coronary angioplasty implant and graft: Secondary | ICD-10-CM | POA: Diagnosis not present

## 2019-03-14 DIAGNOSIS — Z7901 Long term (current) use of anticoagulants: Secondary | ICD-10-CM | POA: Diagnosis not present

## 2019-03-14 DIAGNOSIS — Z7982 Long term (current) use of aspirin: Secondary | ICD-10-CM | POA: Diagnosis not present

## 2019-03-14 DIAGNOSIS — Z8673 Personal history of transient ischemic attack (TIA), and cerebral infarction without residual deficits: Secondary | ICD-10-CM | POA: Diagnosis not present

## 2019-03-14 DIAGNOSIS — F17210 Nicotine dependence, cigarettes, uncomplicated: Secondary | ICD-10-CM | POA: Diagnosis not present

## 2019-03-14 NOTE — Patient Instructions (Signed)
Medication Instructions:  - Your physician recommends that you continue on your current medications as directed. Please refer to the Current Medication list given to you today.  *If you need a refill on your cardiac medications before your next appointment, please call your pharmacy*  Lab Work: - none ordered  If you have labs (blood work) drawn today and your tests are completely normal, you will receive your results only by: Marland Kitchen MyChart Message (if you have MyChart) OR . A paper copy in the mail If you have any lab test that is abnormal or we need to change your treatment, we will call you to review the results.  Testing/Procedures: - none ordered  Follow-Up: At [facility], you and your health needs are our priority.  As part of our continuing mission to provide you with exceptional heart care, we have created designated Provider Care Teams.  These Care Teams include your primary Cardiologist (physician) and Advanced Practice Providers (APPs -  Physician Assistants and Nurse Practitioners) who all work together to provide you with the care you need, when you need it.  Your next appointment:   As scheduled   The format for your next appointment:   In Person  Provider:   [name], MD  Other Instructions - Please try to rest your affected extremity - you may alternate heat/ ice on the area - elevate the extremity when possible

## 2019-03-14 NOTE — Progress Notes (Signed)
Cardiac Individual Treatment Plan  Patient Details  Name: Kimberly Kemp MRN: [id number] Date of Birth: [birth year] Referring Provider:     Cardiac Rehab from 03/14/2019 in [facility] Cardiac and Pulmonary Rehab  Referring Provider  End, Harrell Gave MD      Initial Encounter Date:    Cardiac Rehab from 03/14/2019 in [facility] Cardiac and Pulmonary Rehab  Date  03/14/19      Visit Diagnosis: Status post coronary artery stent placement  Patient's Home Medications on Admission:  Current Outpatient Medications:  .  albuterol (PROAIR HFA) 108 (90 Base) MCG/ACT inhaler, INHALE 2 PUFFS INTO THE LUNGS EVERY FOUR HOURS AS NEEDED FOR WHEEZING (Patient taking differently: Inhale 2 puffs into the lungs every 4 (four) hours as needed for wheezing or shortness of breath. ), Disp: 8.5 Inhaler, Rfl: 5 .  albuterol (PROVENTIL) (2.5 MG/3ML) 0.083% nebulizer solution, TAKE 3 MLS (2.5 MG TOTAL) BY NEBULIZATION 4 TIMES DAILY AS NEEDED FOR WHEEZING OR SHORTNESS OF BREATH (Patient taking differently: Take 2.5 mg by nebulization 4 (four) times daily. ), Disp: 375 mL, Rfl: 3 .  aspirin EC 81 MG tablet, Take 81 mg by mouth 2 (two) times daily. , Disp: , Rfl:  .  benzonatate (TESSALON) 200 MG capsule, Take 200 mg by mouth 3 (three) times daily as needed for cough., Disp: , Rfl:  .  budesonide (PULMICORT) 0.25 MG/2ML nebulizer solution, TAKE 2 MLS (0.25 MG TOTAL) BY NEBULIZATION 2 (TWO) TIMES DAILY., Disp: 360 mL, Rfl: 1 .  butalbital-acetaminophen-caffeine (FIORICET, ESGIC) 50-325-40 MG tablet, Take 1 tablet by mouth 3 (three) times daily as needed for headache., Disp: , Rfl: 2 .  famotidine (PEPCID) 40 MG tablet, Take 40 mg by mouth at bedtime., Disp: , Rfl:  .  gabapentin (NEURONTIN) 300 MG capsule, Take 300 mg by mouth at bedtime., Disp: , Rfl: 1 .  ipratropium (ATROVENT) 0.02 % nebulizer solution, Take 0.5 mg by nebulization 4 (four) times daily., Disp: , Rfl:  .  magic mouthwash w/lidocaine SOLN, Take 10 mLs by mouth 4 (four)  times daily as needed for mouth pain. (Patient taking differently: Take 10 mLs by mouth 2 (two) times daily. ), Disp: 120 mL, Rfl: 0 .  metoprolol tartrate (LOPRESSOR) 25 MG tablet, Take 0.5 tablets (12.5 mg total) by mouth 2 (two) times daily., Disp: 30 tablet, Rfl: 6 .  montelukast (SINGULAIR) 10 MG tablet, Take 10 mg by mouth daily., Disp: , Rfl:  .  Omega-3 Fatty Acids (FISH OIL PO), Take 1 capsule by mouth 2 (two) times daily., Disp: , Rfl:  .  omeprazole (PRILOSEC) 40 MG capsule, TAKE 1 CAPSULE BY MOUTH EVERY DAY (Patient taking differently: Take 40 mg by mouth 2 (two) times daily. ), Disp: 30 capsule, Rfl: 0 .  PAZEO 0.7 % SOLN, Place 1 drop into both eyes daily., Disp: , Rfl:  .  promethazine (PHENERGAN) 25 MG tablet, Take 25 mg by mouth 4 (four) times daily as needed for nausea or vomiting. , Disp: , Rfl: 2 .  promethazine-codeine (PHENERGAN WITH CODEINE) 6.25-10 MG/5ML syrup, Take 5 mLs by mouth every 6 (six) hours as needed for cough. (Patient taking differently: Take 5 mLs by mouth at bedtime as needed for cough. ), Disp: 120 mL, Rfl: 0 .  rosuvastatin (CRESTOR) 40 MG tablet, Take 1 tablet (40 mg total) by mouth daily., Disp: 90 tablet, Rfl: 3 .  ticagrelor (BRILINTA) 90 MG TABS tablet, Take 1 tablet (90 mg total) by mouth 2 (two) times daily., Disp:  60 tablet, Rfl: 6  Past Medical History: Past Medical History:  Diagnosis Date  . ? h/o Paroxysmal atrial fibrillation (HCC)   . ? h/o Pulmonary embolism ([place])   . Anxiety   . Asthma   . Atypical chest pain    a. 09/2018 ETT: poor exercise tolerance; b. 11/2018 Dobutamine Echo: No wall motion abnormalities, EF 55-60%.  Marland Kitchen CAD (coronary artery disease)    a. 02/2019 Cath/PCI: LM nl, LAD nl, LCX nl, RCA 78m(attempted iFR->wire dissection-->3.0x38 Resolute Onyx DES prox, 2.75x38 Resolute Onyx DES mid), RPDA jailed by stent. EF 65%.  . Chronic pain   . [place]osis ([place]   . [place]   . COPD (chronic obstructive pulmonary disease)  [place]   . Depression   . Emphysema   . GERD (gastroesophageal reflux disease)   . Heart murmur   . Hepatitis B   . History of echocardiogram    a. 02/2019 Echo: EF 55-60%. No rwma. No significant valvular disease.  .Marland KitchenHyperlipidemia   . Hypertension   . Palpitations    a. 02/2019 Zio: Sinus, avg 83 (55-135). Rare PACs/PVCs. Four atrial runs, longest 7 beats, fastest 146 bpm. Triggered event->sinus rhythm & artifact.  . [place]   . Tobacco abuse     Tobacco Use: Social History   Tobacco Use  Smoking Status Current Every Day Smoker  . Packs/day: 0.20  . Years: 43.00  . Pack years: 8.60  . Types: Cigarettes  Smokeless Tobacco Never Used  Tobacco Comment   cutting back    Labs: Recent Review Flowsheet Data    Labs for ITP Cardiac and Pulmonary Rehab Latest Ref Rng & Units 09/02/2006 09/04/2006 09/22/2015   Cholestrol 0 - 200 mg/dL - - 305(H)   LDLCALC 0 - 99 mg/dL - - 174(H)   HDL >40 mg/dL - - 83   Trlycerides <150 mg/dL - - 241(H)   PHART 7.350 - 7.450 - 7.453(H) 7.500(H)   PCO2ART 35.0 - 45.0 mmHg - 26.1(L) 25.8(L)   HCO3 20.0 - 24.0 mEq/L 17.0(L) 18.0(L) 20.1   TCO2 0 - 100 mmol/L 18 18.8 21   ACIDBASEDEF 0.0 - 2.0 mmol/L 5.0(H) 5.2(H) 2.0   O2SAT % - 99.2 100.0       Exercise Target Goals: Exercise Program Goal: Individual exercise prescription set using results from initial 6 min walk test and THRR while considering  patient's activity barriers and safety.   Exercise Prescription Goal: Initial exercise prescription builds to 30-45 minutes a day of aerobic activity, 2-3 days per week.  Home exercise guidelines will be given to patient during program as part of exercise prescription that the participant will acknowledge.  Activity Barriers & Risk Stratification: Activity Barriers & Cardiac Risk Stratification - 03/14/19 1407      Activity Barriers & Cardiac Risk Stratification   Activity Barriers  Arthritis;Joint Problems;Deconditioning;Muscular  Weakness;Shortness of Breath    Cardiac Risk Stratification  High       6 Minute Walk: 6 Minute Walk    Row Name 03/14/19 1407         6 Minute Walk   Phase  Initial     Distance  1300 feet     Walk Time  6 minutes     # of Rest Breaks  0     MPH  2.46     METS  4.06     RPE  12     VO2 Peak  14.22     Symptoms  No     Resting HR  95 bpm     Resting BP  126/70     Resting Oxygen Saturation   97 %     Exercise Oxygen Saturation  during 6 min walk  99 %     Max Ex. HR  95 bpm     Max Ex. BP  146/74     2 Minute Post BP  122/56        Oxygen Initial Assessment:   Oxygen Re-Evaluation:   Oxygen Discharge (Final Oxygen Re-Evaluation):   Initial Exercise Prescription: Initial Exercise Prescription - 03/14/19 1400      Date of Initial Exercise RX and Referring Provider   Date  03/14/19    Referring Provider  End, Harrell Gave MD      Treadmill   MPH  2.2    Grade  1    Minutes  15    METs  2.99      Recumbant Bike   Level  3    RPM  50    Watts  38    Minutes  15    METs  3      NuStep   Level  3    SPM  80    Minutes  15    METs  3      Elliptical   Level  1    Speed  3    Minutes  15    METs  3      T5 Nustep   Level  3    SPM  80    Minutes  15    METs  3      Prescription Details   Frequency (times per week)  3    Duration  Progress to 30 minutes of continuous aerobic without signs/symptoms of physical distress      Intensity   THRR 40-80% of Max Heartrate  117-150    Ratings of Perceived Exertion  11-13    Perceived Dyspnea  0-4      Progression   Progression  Continue to progress workloads to maintain intensity without signs/symptoms of physical distress.      Resistance Training   Training Prescription  Yes    Weight  3 lb    Reps  10-15       Perform Capillary Blood Glucose checks as needed.  Exercise Prescription Changes: Exercise Prescription Changes    Row Name 03/14/19 1400             Response to Exercise    Blood Pressure (Admit)  126/70       Blood Pressure (Exercise)  146/74       Blood Pressure (Exit)  122/56       Heart Rate (Admit)  95 bpm       Heart Rate (Exercise)  95 bpm       Heart Rate (Exit)  87 bpm       Oxygen Saturation (Admit)  97 %       Oxygen Saturation (Exercise)  99 %       Rating of Perceived Exertion (Exercise)  12       Symptoms  none        Comments  walk test results          Exercise Comments:   Exercise Goals and Review: Exercise Goals    Row Name 03/14/19 1410  Exercise Goals   Increase Physical Activity  Yes       Intervention  Provide advice, education, support and counseling about physical activity/exercise needs.;Develop an individualized exercise prescription for aerobic and resistive training based on initial evaluation findings, risk stratification, comorbidities and participant's personal goals.       Expected Outcomes  Short Term: Attend rehab on a regular basis to increase amount of physical activity.;Long Term: Add in home exercise to make exercise part of routine and to increase amount of physical activity.;Long Term: Exercising regularly at least 3-5 days a week.       Increase Strength and Stamina  Yes       Intervention  Provide advice, education, support and counseling about physical activity/exercise needs.;Develop an individualized exercise prescription for aerobic and resistive training based on initial evaluation findings, risk stratification, comorbidities and participant's personal goals.       Expected Outcomes  Short Term: Increase workloads from initial exercise prescription for resistance, speed, and METs.;Short Term: Perform resistance training exercises routinely during rehab and add in resistance training at home;Long Term: Improve cardiorespiratory fitness, muscular endurance and strength as measured by increased METs and functional capacity (6MWT)       Able to understand and use rate of perceived exertion (RPE)  scale  Yes       Intervention  Provide education and explanation on how to use RPE scale       Expected Outcomes  Short Term: Able to use RPE daily in rehab to express subjective intensity level;Long Term:  Able to use RPE to guide intensity level when exercising independently       Able to understand and use Dyspnea scale  Yes       Intervention  Provide education and explanation on how to use Dyspnea scale       Expected Outcomes  Short Term: Able to use Dyspnea scale daily in rehab to express subjective sense of shortness of breath during exertion;Long Term: Able to use Dyspnea scale to guide intensity level when exercising independently       Knowledge and understanding of Target Heart Rate Range (THRR)  Yes       Intervention  Provide education and explanation of THRR including how the numbers were predicted and where they are located for reference       Expected Outcomes  Short Term: Able to state/look up THRR;Short Term: Able to use daily as guideline for intensity in rehab;Long Term: Able to use THRR to govern intensity when exercising independently       Able to check pulse independently  Yes       Intervention  Provide education and demonstration on how to check pulse in carotid and radial arteries.;Review the importance of being able to check your own pulse for safety during independent exercise       Expected Outcomes  Short Term: Able to explain why pulse checking is important during independent exercise;Long Term: Able to check pulse independently and accurately       Understanding of Exercise Prescription  Yes       Intervention  Provide education, explanation, and written materials on patient's individual exercise prescription       Expected Outcomes  Short Term: Able to explain program exercise prescription;Long Term: Able to explain home exercise prescription to exercise independently          Exercise Goals Re-Evaluation :   Discharge Exercise Prescription (Final Exercise  Prescription Changes): Exercise Prescription Changes - 03/14/19  1400      Response to Exercise   Blood Pressure (Admit)  126/70    Blood Pressure (Exercise)  146/74    Blood Pressure (Exit)  122/56    Heart Rate (Admit)  95 bpm    Heart Rate (Exercise)  95 bpm    Heart Rate (Exit)  87 bpm    Oxygen Saturation (Admit)  97 %    Oxygen Saturation (Exercise)  99 %    Rating of Perceived Exertion (Exercise)  12    Symptoms  none     Comments  walk test results       Nutrition:  Target Goals: Understanding of nutrition guidelines, daily intake of sodium <1574m, cholesterol <2076m calories 30% from fat and 7% or less from saturated fats, daily to have 5 or more servings of fruits and vegetables.  Biometrics: Pre Biometrics - 03/14/19 1410      Pre Biometrics   Height  5' 3.5" (1.613 m)    Weight  130 lb 6.4 oz (59.1 kg)    BMI (Calculated)  22.73    Single Leg Stand  25.35 seconds        Nutrition Therapy Plan and Nutrition Goals:   Nutrition Assessments: Nutrition Assessments - 03/14/19 1411      MEDFICTS Scores   Pre Score  3       Nutrition Goals Re-Evaluation:   Nutrition Goals Discharge (Final Nutrition Goals Re-Evaluation):   Psychosocial: Target Goals: Acknowledge presence or absence of significant depression and/or stress, maximize coping skills, provide positive support system. Participant is able to verbalize types and ability to use techniques and skills needed for reducing stress and depression.   Initial Review & Psychosocial Screening: Initial Psych Review & Screening - 03/07/19 1041      Initial Review   Current issues with  Current Stress Concerns    Source of Stress Concerns  Family;Chronic Illness      Family Dynamics   Good Support System?  Yes      Barriers   Psychosocial barriers to participate in program  The patient should benefit from training in stress management and relaxation.;There are no identifiable barriers or psychosocial  needs.      Screening Interventions   Interventions  Encouraged to exercise;To provide support and resources with identified psychosocial needs;Provide feedback about the scores to participant    Expected Outcomes  Short Term goal: Utilizing psychosocial counselor, staff and physician to assist with identification of specific Stressors or current issues interfering with healing process. Setting desired goal for each stressor or current issue identified.;Long Term Goal: Stressors or current issues are controlled or eliminated.;Short Term goal: Identification and review with participant of any Quality of Life or Depression concerns found by scoring the questionnaire.;Long Term goal: The participant improves quality of Life and PHQ9 Scores as seen by post scores and/or verbalization of changes       Quality of Life Scores:  Quality of Life - 03/14/19 1411      Quality of Life   Select  Quality of Life      Quality of Life Scores   Health/Function Pre  28.8 %    Socioeconomic Pre  28.07 %    Psych/Spiritual Pre  30 %    Family Pre  30 %    GLOBAL Pre  29.07 %      Scores of 19 and below usually indicate a poorer quality of life in these areas.  A difference of  2-3 points  is a clinically meaningful difference.  A difference of 2-3 points in the total score of the Quality of Life Index has been associated with significant improvement in overall quality of life, self-image, physical symptoms, and general health in studies assessing change in quality of life.  PHQ-9: Recent Review Flowsheet Data    Depression screen [facility] 2/9 03/14/2019 01/25/2016 10/10/2015   Decreased Interest 0 0 0   Down, Depressed, Hopeless 0 0 0   PHQ - 2 Score 0 0 0   Altered sleeping 0 - -   Tired, decreased energy 0 - -   Change in appetite 0 - -   Feeling bad or failure about yourself  0 - -   Trouble concentrating 0 - -   Moving slowly or fidgety/restless 0 - -   Suicidal thoughts 0 - -   PHQ-9 Score 0 - -    Difficult doing work/chores Not difficult at all - -     Interpretation of Total Score  Total Score Depression Severity:  1-4 = Minimal depression, 5-9 = Mild depression, 10-14 = Moderate depression, 15-19 = Moderately severe depression, 20-27 = Severe depression   Psychosocial Evaluation and Intervention: Psychosocial Evaluation - 03/07/19 1049      Psychosocial Evaluation & Interventions   Comments  Karlissa recently lost her sister back in December. She did not mention it, but when asked how her stress levels were she stated she just has a lot going on. She has a good support system. She is trying to quit smoking on her own and wants to feel better. She was proud of herself for increasing her activity throughout the day on her own and is excited to start Cardiac Rehab    Expected Outcomes  Short: attend Cardiac Rehab for exercise and education. Long: develop positive self care habits.    Continue Psychosocial Services   Follow up required by staff       Psychosocial Re-Evaluation:   Psychosocial Discharge (Final Psychosocial Re-Evaluation):   Vocational Rehabilitation: Provide vocational rehab assistance to qualifying candidates.   Vocational Rehab Evaluation & Intervention: Vocational Rehab - 03/07/19 1049      Initial Vocational Rehab Evaluation & Intervention   Assessment shows need for Vocational Rehabilitation  No       Education: Education Goals: Education classes will be provided on a variety of topics geared toward better understanding of heart health and risk factor modification. Participant will state understanding/return demonstration of topics presented as noted by education test scores.  Learning Barriers/Preferences: Learning Barriers/Preferences - 03/07/19 1049      Learning Barriers/Preferences   Learning Barriers  None    Learning Preferences  None       Education Topics:  AED/CPR: - Group verbal and written instruction with the use of models to  demonstrate the basic use of the AED with the basic ABC's of resuscitation.   General Nutrition Guidelines/Fats and Fiber: -Group instruction provided by verbal, written material, models and posters to present the general guidelines for heart healthy nutrition. Gives an explanation and review of dietary fats and fiber.   Controlling Sodium/Reading Food Labels: -Group verbal and written material supporting the discussion of sodium use in heart healthy nutrition. Review and explanation with models, verbal and written materials for utilization of the food label.   Exercise Physiology & General Exercise Guidelines: - Group verbal and written instruction with models to review the exercise physiology of the cardiovascular system and associated critical values. Provides general exercise guidelines with  specific guidelines to those with heart or lung disease.    Aerobic Exercise & Resistance Training: - Gives group verbal and written instruction on the various components of exercise. Focuses on aerobic and resistive training programs and the benefits of this training and how to safely progress through these programs..   Flexibility, Balance, Mind/Body Relaxation: Provides group verbal/written instruction on the benefits of flexibility and balance training, including mind/body exercise modes such as yoga, pilates and tai chi.  Demonstration and skill practice provided.   Stress and Anxiety: - Provides group verbal and written instruction about the health risks of elevated stress and causes of high stress.  Discuss the correlation between heart/lung disease and anxiety and treatment options. Review healthy ways to manage with stress and anxiety.   Depression: - Provides group verbal and written instruction on the correlation between heart/lung disease and depressed mood, treatment options, and the stigmas associated with seeking treatment.   Anatomy & Physiology of the Heart: - Group verbal and  written instruction and models provide basic cardiac anatomy and physiology, with the coronary electrical and arterial systems. Review of Valvular disease and Heart Failure   Cardiac Procedures: - Group verbal and written instruction to review commonly prescribed medications for heart disease. Reviews the medication, class of the drug, and side effects. Includes the steps to properly store meds and maintain the prescription regimen. (beta blockers and nitrates)   Cardiac Medications I: - Group verbal and written instruction to review commonly prescribed medications for heart disease. Reviews the medication, class of the drug, and side effects. Includes the steps to properly store meds and maintain the prescription regimen.   Cardiac Medications II: -Group verbal and written instruction to review commonly prescribed medications for heart disease. Reviews the medication, class of the drug, and side effects. (all other drug classes)    Go Sex-Intimacy & Heart Disease, Get SMART - Goal Setting: - Group verbal and written instruction through game format to discuss heart disease and the return to sexual intimacy. Provides group verbal and written material to discuss and apply goal setting through the application of the S.M.A.R.T. Method.   Other Matters of the Heart: - Provides group verbal, written materials and models to describe Stable Angina and Peripheral Artery. Includes description of the disease process and treatment options available to the cardiac patient.   Exercise & Equipment Safety: - Individual verbal instruction and demonstration of equipment use and safety with use of the equipment.   Cardiac Rehab from 03/14/2019 in [facility] Cardiac and Pulmonary Rehab  Date  03/14/19  Educator  [facility]  Instruction Review Code  1- Verbalizes Understanding      Infection Prevention: - Provides verbal and written material to individual with discussion of infection control including proper hand  washing and proper equipment cleaning during exercise session.   Cardiac Rehab from 03/14/2019 in [facility] Cardiac and Pulmonary Rehab  Date  03/14/19  Educator  [facility]  Instruction Review Code  1- Verbalizes Understanding      Falls Prevention: - Provides verbal and written material to individual with discussion of falls prevention and safety.   Cardiac Rehab from 03/14/2019 in [facility] Cardiac and Pulmonary Rehab  Date  03/14/19  Educator  [facility]  Instruction Review Code  1- Verbalizes Understanding      Diabetes: - Individual verbal and written instruction to review signs/symptoms of diabetes, desired ranges of glucose level fasting, after meals and with exercise. Acknowledge that pre and post exercise glucose checks will be done for  3 sessions at entry of program.   Know Your Numbers and Risk Factors: -Group verbal and written instruction about important numbers in your health.  Discussion of what are risk factors and how they play a role in the disease process.  Review of Cholesterol, Blood Pressure, Diabetes, and BMI and the role they play in your overall health.   Sleep Hygiene: -Provides group verbal and written instruction about how sleep can affect your health.  Define sleep hygiene, discuss sleep cycles and impact of sleep habits. Review good sleep hygiene tips.    Other: -Provides group and verbal instruction on various topics (see comments)   Knowledge Questionnaire Score: Knowledge Questionnaire Score - 03/14/19 1411      Knowledge Questionnaire Score   Pre Score  21/26 Education Focus: A&P, Depression, Angina, PAD, Exercise       Core Components/Risk Factors/Patient Goals at Admission: Personal Goals and Risk Factors at Admission - 03/14/19 1412      Core Components/Risk Factors/Patient Goals on Admission    Weight Management  Yes;Weight Maintenance    Intervention  Weight Management: Develop a combined nutrition and exercise program designed to reach desired caloric intake,  while maintaining appropriate intake of nutrient and fiber, sodium and fats, and appropriate energy expenditure required for the weight goal.;Weight Management: Provide education and appropriate resources to help participant work on and attain dietary goals.    Admit Weight  130 lb 6.4 oz (59.1 kg)    Goal Weight: Short Term  130 lb 6.4 oz (59.1 kg)    Goal Weight: Long Term  130 lb (59 kg)    Expected Outcomes  Short Term: Continue to assess and modify interventions until short term weight is achieved;Long Term: Adherence to nutrition and physical activity/exercise program aimed toward attainment of established weight goal;Weight Maintenance: Understanding of the daily nutrition guidelines, which includes 25-35% calories from fat, 7% or less cal from saturated fats, less than 239m cholesterol, less than 1.5gm of sodium, & 5 or more servings of fruits and vegetables daily    Tobacco Cessation  Yes    Number of packs per day  3 cigs    Intervention  Assist the participant in steps to quit. Provide individualized education and counseling about committing to Tobacco Cessation, relapse prevention, and pharmacological support that can be provided by physician.;OAdvice worker assist with locating and accessing local/national Quit Smoking programs, and support quit date choice.    Expected Outcomes  Short Term: Will quit all tobacco product use, adhering to prevention of relapse plan.;Short Term: Will demonstrate readiness to quit, by selecting a quit date.;Long Term: Complete abstinence from all tobacco products for at least 12 months from quit date.    Hypertension  Yes    Intervention  Provide education on lifestyle modifcations including regular physical activity/exercise, weight management, moderate sodium restriction and increased consumption of fresh fruit, vegetables, and low fat dairy, alcohol moderation, and smoking cessation.;Monitor prescription use compliance.    Expected Outcomes   Short Term: Continued assessment and intervention until BP is < 140/955mHG in hypertensive participants. < 130/8029mG in hypertensive participants with diabetes, heart failure or chronic kidney disease.;Long Term: Maintenance of blood pressure at goal levels.    Lipids  Yes    Intervention  Provide education and support for participant on nutrition & aerobic/resistive exercise along with prescribed medications to achieve LDL <55m39mDL >40mg23m Expected Outcomes  Short Term: Participant states understanding of desired cholesterol values and is compliant  with medications prescribed. Participant is following exercise prescription and nutrition guidelines.;Long Term: Cholesterol controlled with medications as prescribed, with individualized exercise RX and with personalized nutrition plan. Value goals: LDL < 76m, HDL > 40 mg.       Core Components/Risk Factors/Patient Goals Review:    Core Components/Risk Factors/Patient Goals at Discharge (Final Review):    ITP Comments: ITP Comments    Row Name 03/07/19 1046 03/14/19 1406         ITP Comments  Initial phone orientation completed. [facility] EP Orientation scheduled for 2/8  Completed 6MWT and gym orientation.  Initial ITP created and sent for review to Dr. MEmily Filbert Medical Director.         Comments: Initial ITP

## 2019-03-14 NOTE — Progress Notes (Signed)
Office Visit    Patient Name: Kimberly Kemp Date of Encounter: 03/14/2019  Primary Care Provider:  Martin Majestic, FNP Primary Cardiologist:  [name], MD Electrophysiologist:  None   Chief Complaint    Kimberly Kemp is a 54 y.o. female with a hx of CAD, ICM, HTN, HLD, CVA, questionable PAF, questionable PE, COPD, colon cancer, hebatitis B, anxiety, tobacco abuse presents today for evaluation of swelling in left antecubital region   Past Medical History    Past Medical History:  Diagnosis Date  . ? h/o Paroxysmal atrial fibrillation (HCC)   . ? h/o Pulmonary embolism ([place])   . Anxiety   . Asthma   . Atypical chest pain    a. 09/2018 ETT: poor exercise tolerance; b. 11/2018 Dobutamine Echo: No wall motion abnormalities, EF 55-60%.  Marland Kitchen CAD (coronary artery disease)    a. 02/2019 Cath/PCI: LM nl, LAD nl, LCX nl, RCA 20m (attempted iFR->wire dissection-->3.0x38 Resolute Onyx DES prox, 2.75x38 Resolute Onyx DES mid), RPDA jailed by stent. EF 65%.  . Chronic pain   . Cirrhosis ([place])   . Colon cancer ([place])   . COPD (chronic obstructive pulmonary disease) ([place])   . Depression   . Emphysema   . GERD (gastroesophageal reflux disease)   . Heart murmur   . Hepatitis B   . History of echocardiogram    a. 02/2019 Echo: EF 55-60%. No rwma. No significant valvular disease.  Marland Kitchen Hyperlipidemia   . Hypertension   . Palpitations    a. 02/2019 Zio: Sinus, avg 83 (55-135). Rare PACs/PVCs. Four atrial runs, longest 7 beats, fastest 146 bpm. Triggered event->sinus rhythm & artifact.  . Stroke ([place])   . Tobacco abuse    Past Surgical History:  Procedure Laterality Date  . APPENDECTOMY     microscopic   . BRONCHOSCOPY  March 2008, Feb 2012  . CARDIAC CATHETERIZATION    . CESAREAN SECTION    . CORONARY STENT INTERVENTION N/A 02/22/2019   Procedure: CORONARY STENT INTERVENTION;  Surgeon: [name], MD;  Location: [place] [facility];  Service: Cardiovascular;  Laterality: N/A;   . INTRAVASCULAR PRESSURE WIRE/FFR STUDY N/A 02/22/2019   Procedure: INTRAVASCULAR PRESSURE WIRE/FFR STUDY;  Surgeon: [name], MD;  Location: [place] [facility];  Service: Cardiovascular;  Laterality: N/A;  . RIGHT/LEFT HEART CATH AND CORONARY ANGIOGRAPHY N/A 02/22/2019   Procedure: RIGHT/LEFT HEART CATH AND CORONARY ANGIOGRAPHY;  Surgeon: [name], MD;  Location: [place] [facility];  Service: Cardiovascular;  Laterality: N/A;    Allergies  Allergies  Allergen Reactions  . Penicillins Hives    Did it involve swelling of the face/tongue/throat, SOB, or low BP? No Did it involve sudden or severe rash/hives, skin peeling, or any reaction on the inside of your mouth or nose? Yes Did you need to seek medical attention at a hospital or doctor's office? Yes When did it last happen?10 Years If all above answers are "NO", may proceed with cephalosporin use.   . Omnipaque [Iohexol] Itching       . Red Dye Itching    History of Present Illness    Kimberly Kemp is a 54 y.o. female with a hx of CAD s/p PCI to RCA (02/22/19), ICM, HTN, HLD, CVA, questionable PAF, questionable PE, COPD, colon cancer, hebatitis B, anxiety, tobacco abuse. She was last seen by Dr. Saunders Revel 03/02/19.  Previous workup for persistent shortness of breath and chest pain noting moderate to severe mid RCA stenosis.  She underwent cardiac catheterization 02/22/19 and during attempted hemodynamic assessment of RCA occurred and stenting ostial through distal RCA required. RPDA jailed with resultant loss of flow. She was managed medically.   She reports no chest pain, pressure, tightness. No SOB nor DOE. Is starting cardiac rehab this afternoon and excited. She has continued to reduce her tobacco use and is currently smoking 0.5 of a cigarette 3 times per day. She is very motivated to quit. Denies bruising/bleeding complications with Aspirin and Brilinta. She is taking aspirin 81mg  twice daily at direction of  previous provider and is agreeable to switch to once daily.   Fell on 03/12/19. She got up to close her blinds and was on her way to her bedroom when she lost her balance and bumped into the wall. No lightheadedness, no dizziness. Did not lose consciousness. Tells me she caught herself as she hit the ground. Reports she felt weak. Reports no bruising. She will report if further falls occur.   Reports a "knot" in her right AC region. Unclear when it first was noticed. It is tender to the touch, but not tender otherwise. She wonders if it could be from her cardiac catheterization and tells me her sister told her she should have it checked out. Her right radial cath site has a small area of yellow/purple ecchymosis but there is no bruising to her AC. She denies injury to the site.   EKGs/Labs/Other Studies Reviewed:   The following studies were reviewed today: RHC/LHC and PCI (02/22/2019): LMCA normal. LAD normal. LCx normal. Dominant RCA with 60 to 70% mid vessel stenosis. RA 9, RV 33/9, PA 26/13, PCWP 13, Fick CO/CI 7.8/4.9. LVEDP 15. Attempted IFR of mid RCA complicated by guide wire dissection necessitating stenting of the ostial through distal RCA with overlapping Resolute Onyx stents (2.75 x 38 and 3.0 x 38 mm). RCA was jailed with loss of flow.   14-day event monitor (01/05/2019): Predominantly sinus rhythm with rare PAC's and PVC's, as well as rare episodes of brief PSVT.  Patient triggered events correspond to sinus rhythm.   EKG:  No EKG today  Recent Labs: 05/06/2018: ALT 25 02/23/2019: BUN 16; Creatinine, Ser 0.90; Hemoglobin 11.6; Platelets 190; Potassium 3.8; Sodium 140  Recent Lipid Panel    Component Value Date/Time   CHOL 305 (H) 09/22/2015 0347   TRIG 241 (H) 09/22/2015 0347   HDL 83 09/22/2015 0347   CHOLHDL 3.7 09/22/2015 0347   VLDL 48 (H) 09/22/2015 0347   LDLCALC 174 (H) 09/22/2015 0347    Home Medications   Current Meds  Medication Sig  . albuterol (PROAIR HFA) 108 (90  Base) MCG/ACT inhaler INHALE 2 PUFFS INTO THE LUNGS EVERY FOUR HOURS AS NEEDED FOR WHEEZING (Patient taking differently: Inhale 2 puffs into the lungs every 4 (four) hours as needed for wheezing or shortness of breath. )  . albuterol (PROVENTIL) (2.5 MG/3ML) 0.083% nebulizer solution TAKE 3 MLS (2.5 MG TOTAL) BY NEBULIZATION 4 TIMES DAILY AS NEEDED FOR WHEEZING OR SHORTNESS OF BREATH (Patient taking differently: Take 2.5 mg by nebulization 4 (four) times daily. )  . aspirin EC 81 MG tablet Take 81 mg by mouth 2 (two) times daily.   . benzonatate (TESSALON) 200 MG capsule Take 200 mg by mouth 3 (three) times daily as needed for cough.  . budesonide (PULMICORT) 0.25 MG/2ML nebulizer solution TAKE 2 MLS (0.25 MG TOTAL) BY NEBULIZATION 2 (TWO) TIMES DAILY.  . butalbital-acetaminophen-caffeine (FIORICET, ESGIC) 50-325-40 MG tablet Take 1 tablet  by mouth 3 (three) times daily as needed for headache.  . famotidine (PEPCID) 40 MG tablet Take 40 mg by mouth at bedtime.  . gabapentin (NEURONTIN) 300 MG capsule Take 300 mg by mouth at bedtime.  Marland Kitchen ipratropium (ATROVENT) 0.02 % nebulizer solution Take 0.5 mg by nebulization 4 (four) times daily.  . magic mouthwash w/lidocaine SOLN Take 10 mLs by mouth 4 (four) times daily as needed for mouth pain. (Patient taking differently: Take 10 mLs by mouth 2 (two) times daily. )  . metoprolol tartrate (LOPRESSOR) 25 MG tablet Take 0.5 tablets (12.5 mg total) by mouth 2 (two) times daily.  . montelukast (SINGULAIR) 10 MG tablet Take 10 mg by mouth daily.  . Omega-3 Fatty Acids (FISH OIL PO) Take 1 capsule by mouth 2 (two) times daily.  Marland Kitchen omeprazole (PRILOSEC) 40 MG capsule TAKE 1 CAPSULE BY MOUTH EVERY DAY (Patient taking differently: Take 40 mg by mouth 2 (two) times daily. )  . PAZEO 0.7 % SOLN Place 1 drop into both eyes daily.  . promethazine (PHENERGAN) 25 MG tablet Take 25 mg by mouth 4 (four) times daily as needed for nausea or vomiting.   . promethazine-codeine  (PHENERGAN WITH CODEINE) 6.25-10 MG/5ML syrup Take 5 mLs by mouth every 6 (six) hours as needed for cough. (Patient taking differently: Take 5 mLs by mouth at bedtime as needed for cough. )  . rosuvastatin (CRESTOR) 40 MG tablet Take 1 tablet (40 mg total) by mouth daily.  . ticagrelor (BRILINTA) 90 MG TABS tablet Take 1 tablet (90 mg total) by mouth 2 (two) times daily.      Review of Systems      Review of Systems  Constitution: Negative for chills, fever and malaise/fatigue.  Cardiovascular: Negative for chest pain, dyspnea on exertion, irregular heartbeat, leg swelling, near-syncope, orthopnea, palpitations and syncope.  Respiratory: Negative for cough, shortness of breath and wheezing.   Musculoskeletal: Positive for falls.       L elbow "knot"  Gastrointestinal: Negative for melena, nausea and vomiting.  Genitourinary: Negative for hematuria.  Neurological: Negative for dizziness, light-headedness and weakness.   All other systems reviewed and are otherwise negative except as noted above.  Physical Exam    VS:  BP 110/70 (BP Location: Left Arm, Patient Position: Sitting, Cuff Size: Normal)   Pulse 82   Ht 5\' 3"  (1.6 m)   Wt 130 lb 12 oz (59.3 kg)   LMP 07/18/2016   SpO2 99%   BMI 23.16 kg/m  , BMI Body mass index is 23.16 kg/m. GEN: Well nourished, well developed, in no acute distress. HEENT: normal. Neck: Supple, no JVD, carotid bruits, or masses. Cardiac: RRR, no murmurs, rubs, or gallops. No clubbing, cyanosis, edema.  Radials/PT 2+ and equal bilaterally.  Respiratory:  Respirations regular and unlabored, clear to auscultation bilaterally. GI: Soft, nontender, nondistended. MS: No deformity or atrophy. Skin: Warm and dry, no rash. R radial cath site clean, dry, intact. 2"x1" area of yellowed to purple ecchymosis medial to the cath site on the R forearm. In the antecubital region there is an area of very mild swelling 1" in diameter that is tender to the touch with no  erythema, ecchymosis, hematoma.  Neuro:  Strength and sensation are intact. Psych: Normal affect.  Assessment & Plan    1. Left arm pain - Reports a "knot" to her right antecubital region. It is tender to palpation. The is a small 1" in diameter area of very mild swelling to  the R AC region that is soft to touch. Denies injury. No ecchymosis, erythema, nor hematoma. 2+ radial and brachial pulses. Capillary refill less than 2 seconds. She is concerned it is related to her catheterization 02/22/19. Discussed with Dr. Rockey Situ in the office. No evidence of hematoma. Findings not concerning for DVT. Likely small fluid collection or injury. Reassurance provided as R radial cath site is healing appropriately. Recommend heat or ice and as-needed Tylenol for discomfort. Recommend elevating right arm. She will call us or her PCP if it does not improve within the week.   2. CAD - Stable with no anginal symptoms. Establishing with cardiac rehab today after recent STEMI and RCA stenting. DAPT recommended for at least 12 months, continue Brilinta/Aspirin. She was taking Aspirin 81mg  BID and will switch Aspirin to once daily. Continue beta blocker, statin.   R radial cath site continues to heal appropriately. Clean, dry, intact. 2"x1" area yellow/purple ecchymosis medial to cath site on the R forearm.  3. ICM - Echo 03/02/19 with normal LVEF and inferoseptal akinesis. Continue Metoprolol 12.5mg  BID.  4. Familial HLD - Continue Rosuvastatin 40mg  daily. She has been previously referred to lipid clinic.   5. HTN - BP low normal today. Did have a fall last week but reports no lightheadedness, dizziness, near-syncope, syncope.   6. Tobacco abuse - Congratulated on continued reduction in smoking. Smoking cessation encouraged. Recommend utilization of 1800QUITNOW.  Disposition: Follow up in 1 month(s) with Dr. Saunders Revel as previously scheduled.   Loel Dubonnet, NP 03/14/2019, 11:10 AM

## 2019-03-14 NOTE — Patient Instructions (Signed)
Patient Instructions  Patient Details  Name: Kimberly Kemp MRN: [id number] Date of Birth: 17-Jul-1965 Referring Provider:  [name], MD  Below are your personal goals for exercise, nutrition, and risk factors. Our goal is to help you stay on track towards obtaining and maintaining these goals. We will be discussing your progress on these goals with you throughout the program.  Initial Exercise Prescription: Initial Exercise Prescription - 03/14/19 1400      Date of Initial Exercise RX and Referring Provider   Date  03/14/19    Referring Provider  End, Harrell Gave MD      Treadmill   MPH  2.2    Grade  1    Minutes  15    METs  2.99      Recumbant Bike   Level  3    RPM  50    Watts  38    Minutes  15    METs  3      NuStep   Level  3    SPM  80    Minutes  15    METs  3      Elliptical   Level  1    Speed  3    Minutes  15    METs  3      T5 Nustep   Level  3    SPM  80    Minutes  15    METs  3      Prescription Details   Frequency (times per week)  3    Duration  Progress to 30 minutes of continuous aerobic without signs/symptoms of physical distress      Intensity   THRR 40-80% of Max Heartrate  117-150    Ratings of Perceived Exertion  11-13    Perceived Dyspnea  0-4      Progression   Progression  Continue to progress workloads to maintain intensity without signs/symptoms of physical distress.      Resistance Training   Training Prescription  Yes    Weight  3 lb    Reps  10-15       Exercise Goals: Frequency: Be able to perform aerobic exercise two to three times per week in program working toward 2-5 days per week of home exercise.  Intensity: Work with a perceived exertion of 11 (fairly light) - 15 (hard) while following your exercise prescription.  We will make changes to your prescription with you as you progress through the program.   Duration: Be able to do 30 to 45 minutes of continuous aerobic exercise in addition to a 5 minute  warm-up and a 5 minute cool-down routine.   Nutrition Goals: Your personal nutrition goals will be established when you do your nutrition analysis with the dietician.  The following are general nutrition guidelines to follow: Cholesterol < 200mg /day Sodium < 1500mg /day Fiber: Women over 50 yrs - 21 grams per day  Personal Goals: Personal Goals and Risk Factors at Admission - 03/14/19 1412      Core Components/Risk Factors/Patient Goals on Admission    Weight Management  Yes;Weight Maintenance    Intervention  Weight Management: Develop a combined nutrition and exercise program designed to reach desired caloric intake, while maintaining appropriate intake of nutrient and fiber, sodium and fats, and appropriate energy expenditure required for the weight goal.;Weight Management: Provide education and appropriate resources to help participant work on and attain dietary goals.    Admit Weight  130 lb  6.4 oz (59.1 kg)    Goal Weight: Short Term  130 lb 6.4 oz (59.1 kg)    Goal Weight: Long Term  130 lb (59 kg)    Expected Outcomes  Short Term: Continue to assess and modify interventions until short term weight is achieved;Long Term: Adherence to nutrition and physical activity/exercise program aimed toward attainment of established weight goal;Weight Maintenance: Understanding of the daily nutrition guidelines, which includes 25-35% calories from fat, 7% or less cal from saturated fats, less than 200mg  cholesterol, less than 1.5gm of sodium, & 5 or more servings of fruits and vegetables daily    Tobacco Cessation  Yes    Number of packs per day  3 cigs    Intervention  Assist the participant in steps to quit. Provide individualized education and counseling about committing to Tobacco Cessation, relapse prevention, and pharmacological support that can be provided by physician.;Advice worker, assist with locating and accessing local/national Quit Smoking programs, and support quit date  choice.    Expected Outcomes  Short Term: Will quit all tobacco product use, adhering to prevention of relapse plan.;Short Term: Will demonstrate readiness to quit, by selecting a quit date.;Long Term: Complete abstinence from all tobacco products for at least 12 months from quit date.    Hypertension  Yes    Intervention  Provide education on lifestyle modifcations including regular physical activity/exercise, weight management, moderate sodium restriction and increased consumption of fresh fruit, vegetables, and low fat dairy, alcohol moderation, and smoking cessation.;Monitor prescription use compliance.    Expected Outcomes  Short Term: Continued assessment and intervention until BP is < 140/63mm HG in hypertensive participants. < 130/48mm HG in hypertensive participants with diabetes, heart failure or chronic kidney disease.;Long Term: Maintenance of blood pressure at goal levels.    Lipids  Yes    Intervention  Provide education and support for participant on nutrition & aerobic/resistive exercise along with prescribed medications to achieve LDL 70mg , HDL >40mg .    Expected Outcomes  Short Term: Participant states understanding of desired cholesterol values and is compliant with medications prescribed. Participant is following exercise prescription and nutrition guidelines.;Long Term: Cholesterol controlled with medications as prescribed, with individualized exercise RX and with personalized nutrition plan. Value goals: LDL < 70mg , HDL > 40 mg.       Tobacco Use Initial Evaluation: Social History   Tobacco Use  Smoking Status Current Every Day Smoker  . Packs/day: 0.20  . Years: 43.00  . Pack years: 8.60  . Types: Cigarettes  Smokeless Tobacco Never Used  Tobacco Comment   cutting back    Exercise Goals and Review: Exercise Goals    Row Name 03/14/19 1410             Exercise Goals   Increase Physical Activity  Yes       Intervention  Provide advice, education, support and  counseling about physical activity/exercise needs.;Develop an individualized exercise prescription for aerobic and resistive training based on initial evaluation findings, risk stratification, comorbidities and participant's personal goals.       Expected Outcomes  Short Term: Attend rehab on a regular basis to increase amount of physical activity.;Long Term: Add in home exercise to make exercise part of routine and to increase amount of physical activity.;Long Term: Exercising regularly at least 3-5 days a week.       Increase Strength and Stamina  Yes       Intervention  Provide advice, education, support and counseling about physical activity/exercise needs.;Develop  an individualized exercise prescription for aerobic and resistive training based on initial evaluation findings, risk stratification, comorbidities and participant's personal goals.       Expected Outcomes  Short Term: Increase workloads from initial exercise prescription for resistance, speed, and METs.;Short Term: Perform resistance training exercises routinely during rehab and add in resistance training at home;Long Term: Improve cardiorespiratory fitness, muscular endurance and strength as measured by increased METs and functional capacity (6MWT)       Able to understand and use rate of perceived exertion (RPE) scale  Yes       Intervention  Provide education and explanation on how to use RPE scale       Expected Outcomes  Short Term: Able to use RPE daily in rehab to express subjective intensity level;Long Term:  Able to use RPE to guide intensity level when exercising independently       Able to understand and use Dyspnea scale  Yes       Intervention  Provide education and explanation on how to use Dyspnea scale       Expected Outcomes  Short Term: Able to use Dyspnea scale daily in rehab to express subjective sense of shortness of breath during exertion;Long Term: Able to use Dyspnea scale to guide intensity level when exercising  independently       Knowledge and understanding of Target Heart Rate Range (THRR)  Yes       Intervention  Provide education and explanation of THRR including how the numbers were predicted and where they are located for reference       Expected Outcomes  Short Term: Able to state/look up THRR;Short Term: Able to use daily as guideline for intensity in rehab;Long Term: Able to use THRR to govern intensity when exercising independently       Able to check pulse independently  Yes       Intervention  Provide education and demonstration on how to check pulse in carotid and radial arteries.;Review the importance of being able to check your own pulse for safety during independent exercise       Expected Outcomes  Short Term: Able to explain why pulse checking is important during independent exercise;Long Term: Able to check pulse independently and accurately       Understanding of Exercise Prescription  Yes       Intervention  Provide education, explanation, and written materials on patient's individual exercise prescription       Expected Outcomes  Short Term: Able to explain program exercise prescription;Long Term: Able to explain home exercise prescription to exercise independently          Copy of goals given to participant.

## 2019-03-17 ENCOUNTER — Encounter: Payer: Medicare Other | Admitting: Genetic Counselor

## 2019-03-21 ENCOUNTER — Ambulatory Visit: Payer: Medicare Other | Attending: Internal Medicine

## 2019-03-21 DIAGNOSIS — Z20822 Contact with and (suspected) exposure to covid-19: Secondary | ICD-10-CM

## 2019-03-22 LAB — NOVEL CORONAVIRUS, NAA: SARS-CoV-2, NAA: NOT DETECTED

## 2019-03-23 ENCOUNTER — Other Ambulatory Visit: Payer: Self-pay

## 2019-03-23 DIAGNOSIS — H9201 Otalgia, right ear: Secondary | ICD-10-CM | POA: Diagnosis not present

## 2019-03-23 DIAGNOSIS — G4700 Insomnia, unspecified: Secondary | ICD-10-CM | POA: Diagnosis not present

## 2019-03-23 DIAGNOSIS — F17200 Nicotine dependence, unspecified, uncomplicated: Secondary | ICD-10-CM | POA: Diagnosis not present

## 2019-03-23 DIAGNOSIS — Z955 Presence of coronary angioplasty implant and graft: Secondary | ICD-10-CM

## 2019-03-23 DIAGNOSIS — J209 Acute bronchitis, unspecified: Secondary | ICD-10-CM | POA: Diagnosis not present

## 2019-03-23 DIAGNOSIS — J449 Chronic obstructive pulmonary disease, unspecified: Secondary | ICD-10-CM | POA: Diagnosis not present

## 2019-03-23 DIAGNOSIS — J029 Acute pharyngitis, unspecified: Secondary | ICD-10-CM | POA: Diagnosis not present

## 2019-03-23 DIAGNOSIS — Z1389 Encounter for screening for other disorder: Secondary | ICD-10-CM | POA: Diagnosis not present

## 2019-03-23 NOTE — Progress Notes (Signed)
Completed Initial RD Eval 

## 2019-03-30 ENCOUNTER — Encounter: Payer: Self-pay | Admitting: *Deleted

## 2019-03-30 DIAGNOSIS — Z955 Presence of coronary angioplasty implant and graft: Secondary | ICD-10-CM

## 2019-03-30 NOTE — Progress Notes (Signed)
Cardiac Individual Treatment Plan  Patient Details  Name: Kimberly Kemp MRN: [id number] Date of Birth: [birth year] Referring Provider:     Cardiac Rehab from 03/14/2019 in [facility] Cardiac and Pulmonary Rehab  Referring Provider  End, Harrell Gave MD      Initial Encounter Date:    Cardiac Rehab from 03/14/2019 in [facility] Cardiac and Pulmonary Rehab  Date  03/14/19      Visit Diagnosis: Status post coronary artery stent placement  Patient's Home Medications on Admission:  Current Outpatient Medications:  .  albuterol (PROAIR HFA) 108 (90 Base) MCG/ACT inhaler, INHALE 2 PUFFS INTO THE LUNGS EVERY FOUR HOURS AS NEEDED FOR WHEEZING (Patient taking differently: Inhale 2 puffs into the lungs every 4 (four) hours as needed for wheezing or shortness of breath. ), Disp: 8.5 Inhaler, Rfl: 5 .  albuterol (PROVENTIL) (2.5 MG/3ML) 0.083% nebulizer solution, TAKE 3 MLS (2.5 MG TOTAL) BY NEBULIZATION 4 TIMES DAILY AS NEEDED FOR WHEEZING OR SHORTNESS OF BREATH (Patient taking differently: Take 2.5 mg by nebulization 4 (four) times daily. ), Disp: 375 mL, Rfl: 3 .  aspirin EC 81 MG tablet, Take 81 mg by mouth daily., Disp: , Rfl:  .  benzonatate (TESSALON) 200 MG capsule, Take 200 mg by mouth 3 (three) times daily as needed for cough., Disp: , Rfl:  .  budesonide (PULMICORT) 0.25 MG/2ML nebulizer solution, TAKE 2 MLS (0.25 MG TOTAL) BY NEBULIZATION 2 (TWO) TIMES DAILY., Disp: 360 mL, Rfl: 1 .  butalbital-acetaminophen-caffeine (FIORICET, ESGIC) 50-325-40 MG tablet, Take 1 tablet by mouth 3 (three) times daily as needed for headache., Disp: , Rfl: 2 .  famotidine (PEPCID) 40 MG tablet, Take 40 mg by mouth at bedtime., Disp: , Rfl:  .  gabapentin (NEURONTIN) 300 MG capsule, Take 300 mg by mouth at bedtime., Disp: , Rfl: 1 .  ipratropium (ATROVENT) 0.02 % nebulizer solution, Take 0.5 mg by nebulization 4 (four) times daily., Disp: , Rfl:  .  magic mouthwash w/lidocaine SOLN, Take 10 mLs by mouth 4 (four) times daily as  needed for mouth pain. (Patient taking differently: Take 10 mLs by mouth 2 (two) times daily. ), Disp: 120 mL, Rfl: 0 .  metoprolol tartrate (LOPRESSOR) 25 MG tablet, Take 0.5 tablets (12.5 mg total) by mouth 2 (two) times daily., Disp: 30 tablet, Rfl: 6 .  montelukast (SINGULAIR) 10 MG tablet, Take 10 mg by mouth daily., Disp: , Rfl:  .  Omega-3 Fatty Acids (FISH OIL PO), Take 1 capsule by mouth 2 (two) times daily., Disp: , Rfl:  .  omeprazole (PRILOSEC) 40 MG capsule, TAKE 1 CAPSULE BY MOUTH EVERY DAY (Patient taking differently: Take 40 mg by mouth 2 (two) times daily. ), Disp: 30 capsule, Rfl: 0 .  PAZEO 0.7 % SOLN, Place 1 drop into both eyes daily., Disp: , Rfl:  .  promethazine (PHENERGAN) 25 MG tablet, Take 25 mg by mouth 4 (four) times daily as needed for nausea or vomiting. , Disp: , Rfl: 2 .  promethazine-codeine (PHENERGAN WITH CODEINE) 6.25-10 MG/5ML syrup, Take 5 mLs by mouth every 6 (six) hours as needed for cough. (Patient taking differently: Take 5 mLs by mouth at bedtime as needed for cough. ), Disp: 120 mL, Rfl: 0 .  rosuvastatin (CRESTOR) 40 MG tablet, Take 1 tablet (40 mg total) by mouth daily., Disp: 90 tablet, Rfl: 3 .  ticagrelor (BRILINTA) 90 MG TABS tablet, Take 1 tablet (90 mg total) by mouth 2 (two) times daily., Disp: 60 tablet, Rfl: 6  Past Medical History: Past Medical History:  Diagnosis Date  . ? h/o Paroxysmal atrial fibrillation (HCC)   . ? h/o Pulmonary embolism ([place])   . Anxiety   . Asthma   . Atypical chest pain    a. 09/2018 ETT: poor exercise tolerance; b. 11/2018 Dobutamine Echo: No wall motion abnormalities, EF 55-60%.  Marland Kitchen CAD (coronary artery disease)    a. 02/2019 Cath/PCI: LM nl, LAD nl, LCX nl, RCA 70m(attempted iFR->wire dissection-->3.0x38 Resolute Onyx DES prox, 2.75x38 Resolute Onyx DES mid), RPDA jailed by stent. EF 65%.  . Chronic pain   . [place]   . Colon cancer ([place]   . COPD [place] obstructive pulmonary disease) ([place]   .  Depression   . Emphysema   . GERD (gastroesophageal reflux disease)   . Heart murmur   . Hepatitis B   . History of echocardiogram    a. 02/2019 Echo: EF 55-60%. No rwma. No significant valvular disease.  .Marland KitchenHyperlipidemia   . Hypertension   . Palpitations    a. 02/2019 Zio: Sinus, avg 83 (55-135). Rare PACs/PVCs. Four atrial runs, longest 7 beats, fastest 146 bpm. Triggered event->sinus rhythm & artifact.  . Stroke ([place]   . Tobacco abuse     Tobacco Use: Social History   Tobacco Use  Smoking Status Current Every Day Smoker  . Packs/day: 0.20  . Years: 43.00  . Pack years: 8.60  . Types: Cigarettes  Smokeless Tobacco Never Used  Tobacco Comment   cutting back    Labs: Recent Review Flowsheet Data    Labs for ITP Cardiac and Pulmonary Rehab Latest Ref Rng & Units 09/02/2006 09/04/2006 09/22/2015   Cholestrol 0 - 200 mg/dL - - 305(H)   LDLCALC 0 - 99 mg/dL - - 174(H)   HDL >40 mg/dL - - 83   Trlycerides <150 mg/dL - - 241(H)   PHART 7.350 - 7.450 - 7.453(H) 7.500(H)   PCO2ART 35.0 - 45.0 mmHg - 26.1(L) 25.8(L)   HCO3 20.0 - 24.0 mEq/L 17.0(L) 18.0(L) 20.1   TCO2 0 - 100 mmol/L 18 18.8 21   ACIDBASEDEF 0.0 - 2.0 mmol/L 5.0(H) 5.2(H) 2.0   O2SAT % - 99.2 100.0       Exercise Target Goals: Exercise Program Goal: Individual exercise prescription set using results from initial 6 min walk test and THRR while considering  patient's activity barriers and safety.   Exercise Prescription Goal: Initial exercise prescription builds to 30-45 minutes a day of aerobic activity, 2-3 days per week.  Home exercise guidelines will be given to patient during program as part of exercise prescription that the participant will acknowledge.  Activity Barriers & Risk Stratification: Activity Barriers & Cardiac Risk Stratification - 03/14/19 1407      Activity Barriers & Cardiac Risk Stratification   Activity Barriers  Arthritis;Joint Problems;Deconditioning;Muscular Weakness;Shortness of  Breath    Cardiac Risk Stratification  High       6 Minute Walk: 6 Minute Walk    Row Name 03/14/19 1407         6 Minute Walk   Phase  Initial     Distance  1300 feet     Walk Time  6 minutes     # of Rest Breaks  0     MPH  2.46     METS  4.06     RPE  12     VO2 Peak  14.22     Symptoms  No  Resting HR  95 bpm     Resting BP  126/70     Resting Oxygen Saturation   97 %     Exercise Oxygen Saturation  during 6 min walk  99 %     Max Ex. HR  95 bpm     Max Ex. BP  146/74     2 Minute Post BP  122/56        Oxygen Initial Assessment:   Oxygen Re-Evaluation:   Oxygen Discharge (Final Oxygen Re-Evaluation):   Initial Exercise Prescription: Initial Exercise Prescription - 03/14/19 1400      Date of Initial Exercise RX and Referring Provider   Date  03/14/19    Referring Provider  End, Harrell Gave MD      Treadmill   MPH  2.2    Grade  1    Minutes  15    METs  2.99      Recumbant Bike   Level  3    RPM  50    Watts  38    Minutes  15    METs  3      NuStep   Level  3    SPM  80    Minutes  15    METs  3      Elliptical   Level  1    Speed  3    Minutes  15    METs  3      T5 Nustep   Level  3    SPM  80    Minutes  15    METs  3      Prescription Details   Frequency (times per week)  3    Duration  Progress to 30 minutes of continuous aerobic without signs/symptoms of physical distress      Intensity   THRR 40-80% of Max Heartrate  117-150    Ratings of Perceived Exertion  11-13    Perceived Dyspnea  0-4      Progression   Progression  Continue to progress workloads to maintain intensity without signs/symptoms of physical distress.      Resistance Training   Training Prescription  Yes    Weight  3 lb    Reps  10-15       Perform Capillary Blood Glucose checks as needed.  Exercise Prescription Changes: Exercise Prescription Changes    Row Name 03/14/19 1400             Response to Exercise   Blood Pressure  (Admit)  126/70       Blood Pressure (Exercise)  146/74       Blood Pressure (Exit)  122/56       Heart Rate (Admit)  95 bpm       Heart Rate (Exercise)  95 bpm       Heart Rate (Exit)  87 bpm       Oxygen Saturation (Admit)  97 %       Oxygen Saturation (Exercise)  99 %       Rating of Perceived Exertion (Exercise)  12       Symptoms  none        Comments  walk test results          Exercise Comments:   Exercise Goals and Review: Exercise Goals    Row Name 03/14/19 1410             Exercise Goals   Increase  Physical Activity  Yes       Intervention  Provide advice, education, support and counseling about physical activity/exercise needs.;Develop an individualized exercise prescription for aerobic and resistive training based on initial evaluation findings, risk stratification, comorbidities and participant's personal goals.       Expected Outcomes  Short Term: Attend rehab on a regular basis to increase amount of physical activity.;Long Term: Add in home exercise to make exercise part of routine and to increase amount of physical activity.;Long Term: Exercising regularly at least 3-5 days a week.       Increase Strength and Stamina  Yes       Intervention  Provide advice, education, support and counseling about physical activity/exercise needs.;Develop an individualized exercise prescription for aerobic and resistive training based on initial evaluation findings, risk stratification, comorbidities and participant's personal goals.       Expected Outcomes  Short Term: Increase workloads from initial exercise prescription for resistance, speed, and METs.;Short Term: Perform resistance training exercises routinely during rehab and add in resistance training at home;Long Term: Improve cardiorespiratory fitness, muscular endurance and strength as measured by increased METs and functional capacity (6MWT)       Able to understand and use rate of perceived exertion (RPE) scale  Yes        Intervention  Provide education and explanation on how to use RPE scale       Expected Outcomes  Short Term: Able to use RPE daily in rehab to express subjective intensity level;Long Term:  Able to use RPE to guide intensity level when exercising independently       Able to understand and use Dyspnea scale  Yes       Intervention  Provide education and explanation on how to use Dyspnea scale       Expected Outcomes  Short Term: Able to use Dyspnea scale daily in rehab to express subjective sense of shortness of breath during exertion;Long Term: Able to use Dyspnea scale to guide intensity level when exercising independently       Knowledge and understanding of Target Heart Rate Range (THRR)  Yes       Intervention  Provide education and explanation of THRR including how the numbers were predicted and where they are located for reference       Expected Outcomes  Short Term: Able to state/look up THRR;Short Term: Able to use daily as guideline for intensity in rehab;Long Term: Able to use THRR to govern intensity when exercising independently       Able to check pulse independently  Yes       Intervention  Provide education and demonstration on how to check pulse in carotid and radial arteries.;Review the importance of being able to check your own pulse for safety during independent exercise       Expected Outcomes  Short Term: Able to explain why pulse checking is important during independent exercise;Long Term: Able to check pulse independently and accurately       Understanding of Exercise Prescription  Yes       Intervention  Provide education, explanation, and written materials on patient's individual exercise prescription       Expected Outcomes  Short Term: Able to explain program exercise prescription;Long Term: Able to explain home exercise prescription to exercise independently          Exercise Goals Re-Evaluation :   Discharge Exercise Prescription (Final Exercise Prescription  Changes): Exercise Prescription Changes - 03/14/19 1400  Response to Exercise   Blood Pressure (Admit)  126/70    Blood Pressure (Exercise)  146/74    Blood Pressure (Exit)  122/56    Heart Rate (Admit)  95 bpm    Heart Rate (Exercise)  95 bpm    Heart Rate (Exit)  87 bpm    Oxygen Saturation (Admit)  97 %    Oxygen Saturation (Exercise)  99 %    Rating of Perceived Exertion (Exercise)  12    Symptoms  none     Comments  walk test results       Nutrition:  Target Goals: Understanding of nutrition guidelines, daily intake of sodium <1557m, cholesterol <2062m calories 30% from fat and 7% or less from saturated fats, daily to have 5 or more servings of fruits and vegetables.  Biometrics: Pre Biometrics - 03/14/19 1410      Pre Biometrics   Height  5' 3.5" (1.613 m)    Weight  130 lb 6.4 oz (59.1 kg)    BMI (Calculated)  22.73    Single Leg Stand  25.35 seconds        Nutrition Therapy Plan and Nutrition Goals: Nutrition Therapy & Goals - 03/23/19 0730      Nutrition Therapy   Diet  HH, Low Na    Protein (specify units)  65-70g    Fiber  25 grams    Whole Grain Foods  3 servings    Saturated Fats  12 max. grams    Fruits and Vegetables  5 servings/day    Sodium  1.5 grams      Personal Nutrition Goals   Nutrition Goal  ST: Mechanical eating - 3 small meals using MyPlate structure with fat LT: RD goal (eat consistent meals) - Unable to fully discuss goals with pt as pt not feeling well    Comments  Pt reports eating chicken or salmon patties and lots of vegetables. Unable to get full diet recall. Pt was not feeling well, but wanted to continue with session. Pt reports eating this way for a while and not knowing what to eat, pt also reports no longer feeling hunger and this was before heart event. Suspect long term low calorie intake, pt at risk for refeeding syndrome - will introduce food slowly; starting with 3 small meals and moving to 6 small meals using mechanical  eating. Pt reports no recent weight loss, but said she will know more when she goes to the doctor today. Discussed HH eating, gave examples of each food group and some meal/snack examples, adequate intake, hunger and mechanical eating, and balanced eating without stress. Pt open to changes. Will follow up.      Intervention Plan   Intervention  Nutrition handout(s) given to patient.;Prescribe, educate and counsel regarding individualized specific dietary modifications aiming towards targeted core components such as weight, hypertension, lipid management, diabetes, heart failure and other comorbidities.    Expected Outcomes  Short Term Goal: Understand basic principles of dietary content, such as calories, fat, sodium, cholesterol and nutrients.;Short Term Goal: A plan has been developed with personal nutrition goals set during dietitian appointment.;Long Term Goal: Adherence to prescribed nutrition plan.       Nutrition Assessments: Nutrition Assessments - 03/14/19 1411      MEDFICTS Scores   Pre Score  3       Nutrition Goals Re-Evaluation:   Nutrition Goals Discharge (Final Nutrition Goals Re-Evaluation):   Psychosocial: Target Goals: Acknowledge presence or absence of significant depression and/or  stress, maximize coping skills, provide positive support system. Participant is able to verbalize types and ability to use techniques and skills needed for reducing stress and depression.   Initial Review & Psychosocial Screening: Initial Psych Review & Screening - 03/07/19 1041      Initial Review   Current issues with  Current Stress Concerns    Source of Stress Concerns  Family;Chronic Illness      Family Dynamics   Good Support System?  Yes      Barriers   Psychosocial barriers to participate in program  The patient should benefit from training in stress management and relaxation.;There are no identifiable barriers or psychosocial needs.      Screening Interventions    Interventions  Encouraged to exercise;To provide support and resources with identified psychosocial needs;Provide feedback about the scores to participant    Expected Outcomes  Short Term goal: Utilizing psychosocial counselor, staff and physician to assist with identification of specific Stressors or current issues interfering with healing process. Setting desired goal for each stressor or current issue identified.;Long Term Goal: Stressors or current issues are controlled or eliminated.;Short Term goal: Identification and review with participant of any Quality of Life or Depression concerns found by scoring the questionnaire.;Long Term goal: The participant improves quality of Life and PHQ9 Scores as seen by post scores and/or verbalization of changes       Quality of Life Scores:  Quality of Life - 03/14/19 1411      Quality of Life   Select  Quality of Life      Quality of Life Scores   Health/Function Pre  28.8 %    Socioeconomic Pre  28.07 %    Psych/Spiritual Pre  30 %    Family Pre  30 %    GLOBAL Pre  29.07 %      Scores of 19 and below usually indicate a poorer quality of life in these areas.  A difference of  2-3 points is a clinically meaningful difference.  A difference of 2-3 points in the total score of the Quality of Life Index has been associated with significant improvement in overall quality of life, self-image, physical symptoms, and general health in studies assessing change in quality of life.  PHQ-9: Recent Review Flowsheet Data    Depression screen [facility] 2/9 03/14/2019 01/25/2016 10/10/2015   Decreased Interest 0 0 0   Down, Depressed, Hopeless 0 0 0   PHQ - 2 Score 0 0 0   Altered sleeping 0 - -   Tired, decreased energy 0 - -   Change in appetite 0 - -   Feeling bad or failure about yourself  0 - -   Trouble concentrating 0 - -   Moving slowly or fidgety/restless 0 - -   Suicidal thoughts 0 - -   PHQ-9 Score 0 - -   Difficult doing work/chores Not difficult at  all - -     Interpretation of Total Score  Total Score Depression Severity:  1-4 = Minimal depression, 5-9 = Mild depression, 10-14 = Moderate depression, 15-19 = Moderately severe depression, 20-27 = Severe depression   Psychosocial Evaluation and Intervention: Psychosocial Evaluation - 03/07/19 1049      Psychosocial Evaluation & Interventions   Comments  Adalay recently lost her sister back in December. She did not mention it, but when asked how her stress levels were she stated she just has a lot going on. She has a good support system. She is  trying to quit smoking on her own and wants to feel better. She was proud of herself for increasing her activity throughout the day on her own and is excited to start Cardiac Rehab    Expected Outcomes  Short: attend Cardiac Rehab for exercise and education. Long: develop positive self care habits.    Continue Psychosocial Services   Follow up required by staff       Psychosocial Re-Evaluation:   Psychosocial Discharge (Final Psychosocial Re-Evaluation):   Vocational Rehabilitation: Provide vocational rehab assistance to qualifying candidates.   Vocational Rehab Evaluation & Intervention: Vocational Rehab - 03/07/19 1049      Initial Vocational Rehab Evaluation & Intervention   Assessment shows need for Vocational Rehabilitation  No       Education: Education Goals: Education classes will be provided on a variety of topics geared toward better understanding of heart health and risk factor modification. Participant will state understanding/return demonstration of topics presented as noted by education test scores.  Learning Barriers/Preferences: Learning Barriers/Preferences - 03/07/19 1049      Learning Barriers/Preferences   Learning Barriers  None    Learning Preferences  None       Education Topics:  AED/CPR: - Group verbal and written instruction with the use of models to demonstrate the basic use of the AED with the basic  ABC's of resuscitation.   General Nutrition Guidelines/Fats and Fiber: -Group instruction provided by verbal, written material, models and posters to present the general guidelines for heart healthy nutrition. Gives an explanation and review of dietary fats and fiber.   Controlling Sodium/Reading Food Labels: -Group verbal and written material supporting the discussion of sodium use in heart healthy nutrition. Review and explanation with models, verbal and written materials for utilization of the food label.   Exercise Physiology & General Exercise Guidelines: - Group verbal and written instruction with models to review the exercise physiology of the cardiovascular system and associated critical values. Provides general exercise guidelines with specific guidelines to those with heart or lung disease.    Aerobic Exercise & Resistance Training: - Gives group verbal and written instruction on the various components of exercise. Focuses on aerobic and resistive training programs and the benefits of this training and how to safely progress through these programs..   Flexibility, Balance, Mind/Body Relaxation: Provides group verbal/written instruction on the benefits of flexibility and balance training, including mind/body exercise modes such as yoga, pilates and tai chi.  Demonstration and skill practice provided.   Stress and Anxiety: - Provides group verbal and written instruction about the health risks of elevated stress and causes of high stress.  Discuss the correlation between heart/lung disease and anxiety and treatment options. Review healthy ways to manage with stress and anxiety.   Depression: - Provides group verbal and written instruction on the correlation between heart/lung disease and depressed mood, treatment options, and the stigmas associated with seeking treatment.   Anatomy & Physiology of the Heart: - Group verbal and written instruction and models provide basic  cardiac anatomy and physiology, with the coronary electrical and arterial systems. Review of Valvular disease and Heart Failure   Cardiac Procedures: - Group verbal and written instruction to review commonly prescribed medications for heart disease. Reviews the medication, class of the drug, and side effects. Includes the steps to properly store meds and maintain the prescription regimen. (beta blockers and nitrates)   Cardiac Medications I: - Group verbal and written instruction to review commonly prescribed medications for heart disease. Reviews  the medication, class of the drug, and side effects. Includes the steps to properly store meds and maintain the prescription regimen.   Cardiac Medications II: -Group verbal and written instruction to review commonly prescribed medications for heart disease. Reviews the medication, class of the drug, and side effects. (all other drug classes)    Go Sex-Intimacy & Heart Disease, Get SMART - Goal Setting: - Group verbal and written instruction through game format to discuss heart disease and the return to sexual intimacy. Provides group verbal and written material to discuss and apply goal setting through the application of the S.M.A.R.T. Method.   Other Matters of the Heart: - Provides group verbal, written materials and models to describe Stable Angina and Peripheral Artery. Includes description of the disease process and treatment options available to the cardiac patient.   Exercise & Equipment Safety: - Individual verbal instruction and demonstration of equipment use and safety with use of the equipment.   Cardiac Rehab from 03/14/2019 in [facility] Cardiac and Pulmonary Rehab  Date  03/14/19  Educator  [facility]  Instruction Review Code  1- Verbalizes Understanding      Infection Prevention: - Provides verbal and written material to individual with discussion of infection control including proper hand washing and proper equipment cleaning during  exercise session.   Cardiac Rehab from 03/14/2019 in [facility] Cardiac and Pulmonary Rehab  Date  03/14/19  Educator  [facility]  Instruction Review Code  1- Verbalizes Understanding      Falls Prevention: - Provides verbal and written material to individual with discussion of falls prevention and safety.   Cardiac Rehab from 03/14/2019 in [facility] Cardiac and Pulmonary Rehab  Date  03/14/19  Educator  [facility]  Instruction Review Code  1- Verbalizes Understanding      Diabetes: - Individual verbal and written instruction to review signs/symptoms of diabetes, desired ranges of glucose level fasting, after meals and with exercise. Acknowledge that pre and post exercise glucose checks will be done for 3 sessions at entry of program.   Know Your Numbers and Risk Factors: -Group verbal and written instruction about important numbers in your health.  Discussion of what are risk factors and how they play a role in the disease process.  Review of Cholesterol, Blood Pressure, Diabetes, and BMI and the role they play in your overall health.   Sleep Hygiene: -Provides group verbal and written instruction about how sleep can affect your health.  Define sleep hygiene, discuss sleep cycles and impact of sleep habits. Review good sleep hygiene tips.    Other: -Provides group and verbal instruction on various topics (see comments)   Knowledge Questionnaire Score: Knowledge Questionnaire Score - 03/14/19 1411      Knowledge Questionnaire Score   Pre Score  21/26 Education Focus: A&P, Depression, Angina, PAD, Exercise       Core Components/Risk Factors/Patient Goals at Admission: Personal Goals and Risk Factors at Admission - 03/14/19 1412      Core Components/Risk Factors/Patient Goals on Admission    Weight Management  Yes;Weight Maintenance    Intervention  Weight Management: Develop a combined nutrition and exercise program designed to reach desired caloric intake, while maintaining appropriate intake of  nutrient and fiber, sodium and fats, and appropriate energy expenditure required for the weight goal.;Weight Management: Provide education and appropriate resources to help participant work on and attain dietary goals.    Admit Weight  130 lb 6.4 oz (59.1 kg)    Goal Weight: Short Term  130 lb 6.4  oz (59.1 kg)    Goal Weight: Long Term  130 lb (59 kg)    Expected Outcomes  Short Term: Continue to assess and modify interventions until short term weight is achieved;Long Term: Adherence to nutrition and physical activity/exercise program aimed toward attainment of established weight goal;Weight Maintenance: Understanding of the daily nutrition guidelines, which includes 25-35% calories from fat, 7% or less cal from saturated fats, less than 250m cholesterol, less than 1.5gm of sodium, & 5 or more servings of fruits and vegetables daily    Tobacco Cessation  Yes    Number of packs per day  3 cigs    Intervention  Assist the participant in steps to quit. Provide individualized education and counseling about committing to Tobacco Cessation, relapse prevention, and pharmacological support that can be provided by physician.;OAdvice worker assist with locating and accessing local/national Quit Smoking programs, and support quit date choice.    Expected Outcomes  Short Term: Will quit all tobacco product use, adhering to prevention of relapse plan.;Short Term: Will demonstrate readiness to quit, by selecting a quit date.;Long Term: Complete abstinence from all tobacco products for at least 12 months from quit date.    Hypertension  Yes    Intervention  Provide education on lifestyle modifcations including regular physical activity/exercise, weight management, moderate sodium restriction and increased consumption of fresh fruit, vegetables, and low fat dairy, alcohol moderation, and smoking cessation.;Monitor prescription use compliance.    Expected Outcomes  Short Term: Continued assessment and  intervention until BP is < 140/978mHG in hypertensive participants. < 130/8091mG in hypertensive participants with diabetes, heart failure or chronic kidney disease.;Long Term: Maintenance of blood pressure at goal levels.    Lipids  Yes    Intervention  Provide education and support for participant on nutrition & aerobic/resistive exercise along with prescribed medications to achieve LDL <73m48mDL >40mg46m Expected Outcomes  Short Term: Participant states understanding of desired cholesterol values and is compliant with medications prescribed. Participant is following exercise prescription and nutrition guidelines.;Long Term: Cholesterol controlled with medications as prescribed, with individualized exercise RX and with personalized nutrition plan. Value goals: LDL < 73mg,19m > 40 mg.       Core Components/Risk Factors/Patient Goals Review:    Core Components/Risk Factors/Patient Goals at Discharge (Final Review):    ITP Comments: ITP Comments    Row Name 03/07/19 1046 03/14/19 1406 03/23/19 0730 03/30/19 0621     ITP Comments  Initial phone orientation completed. Diagnosis can be found in [facility] EP Orientation scheduled for 2/8  Completed 6MWT and gym orientation.  Initial ITP created and sent for review to Dr. Mark MEmily Filbertcal Director.  Completed Initial RD Eval  30 day chart review completed. ITP sent to Dr M MillZachery Dakinsal Director, for review,changes as needed and signature.       Comments:

## 2019-03-31 ENCOUNTER — Other Ambulatory Visit: Payer: Self-pay

## 2019-03-31 ENCOUNTER — Encounter: Payer: Medicare Other | Admitting: *Deleted

## 2019-03-31 DIAGNOSIS — J439 Emphysema, unspecified: Secondary | ICD-10-CM | POA: Diagnosis not present

## 2019-03-31 DIAGNOSIS — Z7901 Long term (current) use of anticoagulants: Secondary | ICD-10-CM | POA: Diagnosis not present

## 2019-03-31 DIAGNOSIS — E785 Hyperlipidemia, unspecified: Secondary | ICD-10-CM | POA: Diagnosis not present

## 2019-03-31 DIAGNOSIS — I2510 Atherosclerotic heart disease of native coronary artery without angina pectoris: Secondary | ICD-10-CM | POA: Diagnosis not present

## 2019-03-31 DIAGNOSIS — Z955 Presence of coronary angioplasty implant and graft: Secondary | ICD-10-CM | POA: Diagnosis not present

## 2019-03-31 DIAGNOSIS — Z7982 Long term (current) use of aspirin: Secondary | ICD-10-CM | POA: Diagnosis not present

## 2019-03-31 DIAGNOSIS — Z8673 Personal history of transient ischemic attack (TIA), and cerebral infarction without residual deficits: Secondary | ICD-10-CM | POA: Diagnosis not present

## 2019-03-31 DIAGNOSIS — I10 Essential (primary) hypertension: Secondary | ICD-10-CM | POA: Diagnosis not present

## 2019-03-31 DIAGNOSIS — Z79899 Other long term (current) drug therapy: Secondary | ICD-10-CM | POA: Diagnosis not present

## 2019-03-31 DIAGNOSIS — K219 Gastro-esophageal reflux disease without esophagitis: Secondary | ICD-10-CM | POA: Diagnosis not present

## 2019-03-31 NOTE — Progress Notes (Signed)
Daily Session Note  Patient Details  Name: Kimberly Kemp MRN: [id number] Date of Birth: 01-Nov-1965 Referring Provider:     Cardiac Rehab from 03/14/2019 in [facility] Cardiac and Pulmonary Rehab  Referring Provider  End, Harrell Gave MD      Encounter Date: 03/31/2019  Check In: Session Check In - 03/31/19 1348      Check-In   Supervising physician immediately available to respond to emergencies  See telemetry face sheet for immediately available ER MD    Location  [facility]-Cardiac & Pulmonary Rehab    Staff Present  Renita Papa, RN BSN;Jessica [place], MA, RCEP, CCRP, [place], [place], ACSM CEP, Exercise Physiologist    Virtual Visit  No    Medication changes reported      No    Fall or balance concerns reported     Yes    Comments  fell at home due to weakness. MD aware and told her to monitor symptoms    Tobacco Cessation  No Change    Warm-up and Cool-down  Performed on first and last piece of equipment    Resistance Training Performed  Yes    VAD Patient?  No    PAD/SET Patient?  No      Pain Assessment   Currently in Pain?  No/denies          Social History   Tobacco Use  Smoking Status Current Every Day Smoker  . Packs/day: 0.20  . Years: 43.00  . Pack years: 8.60  . Types: Cigarettes  Smokeless Tobacco Never Used  Tobacco Comment   cutting back    Goals Met:  Independence with exercise equipment Exercise tolerated well No report of cardiac concerns or symptoms  Goals Unmet:  Not Applicable  Comments: Pt able to follow exercise prescription today without complaint.  Will continue to monitor for progression.    Dr. Emily Filbert is Medical Director for [place] and [facility].

## 2019-04-04 ENCOUNTER — Telehealth: Payer: Self-pay | Admitting: Internal Medicine

## 2019-04-04 ENCOUNTER — Other Ambulatory Visit: Payer: Self-pay

## 2019-04-04 ENCOUNTER — Encounter: Payer: Medicare Other | Attending: Internal Medicine | Admitting: *Deleted

## 2019-04-04 DIAGNOSIS — Z7982 Long term (current) use of aspirin: Secondary | ICD-10-CM | POA: Diagnosis not present

## 2019-04-04 DIAGNOSIS — E785 Hyperlipidemia, unspecified: Secondary | ICD-10-CM | POA: Diagnosis not present

## 2019-04-04 DIAGNOSIS — I10 Essential (primary) hypertension: Secondary | ICD-10-CM | POA: Insufficient documentation

## 2019-04-04 DIAGNOSIS — K219 Gastro-esophageal reflux disease without esophagitis: Secondary | ICD-10-CM | POA: Insufficient documentation

## 2019-04-04 DIAGNOSIS — Z7901 Long term (current) use of anticoagulants: Secondary | ICD-10-CM | POA: Insufficient documentation

## 2019-04-04 DIAGNOSIS — Z8673 Personal history of transient ischemic attack (TIA), and cerebral infarction without residual deficits: Secondary | ICD-10-CM | POA: Diagnosis not present

## 2019-04-04 DIAGNOSIS — F17210 Nicotine dependence, cigarettes, uncomplicated: Secondary | ICD-10-CM | POA: Insufficient documentation

## 2019-04-04 DIAGNOSIS — Z955 Presence of coronary angioplasty implant and graft: Secondary | ICD-10-CM | POA: Diagnosis not present

## 2019-04-04 DIAGNOSIS — Z79899 Other long term (current) drug therapy: Secondary | ICD-10-CM | POA: Insufficient documentation

## 2019-04-04 DIAGNOSIS — I2510 Atherosclerotic heart disease of native coronary artery without angina pectoris: Secondary | ICD-10-CM | POA: Insufficient documentation

## 2019-04-04 DIAGNOSIS — J439 Emphysema, unspecified: Secondary | ICD-10-CM | POA: Insufficient documentation

## 2019-04-04 NOTE — Progress Notes (Signed)
Daily Session Note  Patient Details  Name: Kimberly Kemp MRN: [id number] Date of Birth: 30-Sep-1965 Referring Provider:     Cardiac Rehab from 03/14/2019 in [facility] Cardiac and Pulmonary Rehab  Referring Provider  End, Harrell Gave MD      Encounter Date: 04/04/2019  Check In: Session Check In - 04/04/19 1530      Check-In   Supervising physician immediately available to respond to emergencies  See telemetry face sheet for immediately available ER MD    Location  [facility]-Cardiac & Pulmonary Rehab    Staff Present  Renita Papa, RN BSN;Joseph [address] [place], [place], ACSM CEP, Exercise Physiologist    Virtual Visit  No    Medication changes reported      No    Fall or balance concerns reported     No    Warm-up and Cool-down  Performed on first and last piece of equipment    Resistance Training Performed  Yes    VAD Patient?  No    PAD/SET Patient?  No      Pain Assessment   Currently in Pain?  No/denies          Social History   Tobacco Use  Smoking Status Current Every Day Smoker  . Packs/day: 0.20  . Years: 43.00  . Pack years: 8.60  . Types: Cigarettes  Smokeless Tobacco Never Used  Tobacco Comment   cutting back    Goals Met:  Independence with exercise equipment Exercise tolerated well No report of cardiac concerns or symptoms Strength training completed today  Goals Unmet:  Not Applicable  Comments: Pt able to follow exercise prescription today without complaint.  Will continue to monitor for progression.    Dr. Emily Filbert is Medical Director for [place] [facility] and [facility].

## 2019-04-04 NOTE — Telephone Encounter (Signed)
Called cath lab at [facility] and Lovey Newcomer is able to write a new card for the patient. She will give it to Dr End to bring down for me and I will give to patient at [place] next week.   Patient notified and was appreciative.

## 2019-04-04 NOTE — Telephone Encounter (Signed)
Patient states she has misplaced the card she received when her stent was placed. Please call to discuss.

## 2019-04-06 ENCOUNTER — Encounter: Payer: Medicare Other | Admitting: *Deleted

## 2019-04-06 ENCOUNTER — Other Ambulatory Visit: Payer: Self-pay

## 2019-04-06 DIAGNOSIS — Z7982 Long term (current) use of aspirin: Secondary | ICD-10-CM | POA: Diagnosis not present

## 2019-04-06 DIAGNOSIS — I2510 Atherosclerotic heart disease of native coronary artery without angina pectoris: Secondary | ICD-10-CM | POA: Diagnosis not present

## 2019-04-06 DIAGNOSIS — Z955 Presence of coronary angioplasty implant and graft: Secondary | ICD-10-CM

## 2019-04-06 DIAGNOSIS — Z8673 Personal history of transient ischemic attack (TIA), and cerebral infarction without residual deficits: Secondary | ICD-10-CM | POA: Diagnosis not present

## 2019-04-06 DIAGNOSIS — J439 Emphysema, unspecified: Secondary | ICD-10-CM | POA: Diagnosis not present

## 2019-04-06 DIAGNOSIS — I10 Essential (primary) hypertension: Secondary | ICD-10-CM | POA: Diagnosis not present

## 2019-04-06 DIAGNOSIS — Z79899 Other long term (current) drug therapy: Secondary | ICD-10-CM | POA: Diagnosis not present

## 2019-04-06 DIAGNOSIS — K219 Gastro-esophageal reflux disease without esophagitis: Secondary | ICD-10-CM | POA: Diagnosis not present

## 2019-04-06 DIAGNOSIS — Z7901 Long term (current) use of anticoagulants: Secondary | ICD-10-CM | POA: Diagnosis not present

## 2019-04-06 DIAGNOSIS — E785 Hyperlipidemia, unspecified: Secondary | ICD-10-CM | POA: Diagnosis not present

## 2019-04-06 NOTE — Progress Notes (Signed)
Daily Session Note  Patient Details  Name: Kimberly Kemp MRN: [id number] Date of Birth: 03/30/1965 Referring Provider:     Cardiac Rehab from 03/14/2019 in [facility] Cardiac and Pulmonary Rehab  Referring Provider  End, Christopher MD      Encounter Date: 04/06/2019  Check In: Session Check In - 04/06/19 1345      Check-In   Supervising physician immediately available to respond to emergencies  See telemetry face sheet for immediately available ER MD    Location  [facility]-Cardiac & Pulmonary Rehab    Staff Present  Meredith Craven, RN BSN;Joseph Hood RCP,RRT,BSRT;Amanda Sommer, BA, ACSM CEP, Exercise Physiologist    Virtual Visit  No    Medication changes reported      No    Fall or balance concerns reported     No    Tobacco Cessation  No Change   4 cigs   Warm-up and Cool-down  Performed on first and last piece of equipment    Resistance Training Performed  Yes    VAD Patient?  No    PAD/SET Patient?  No      Pain Assessment   Currently in Pain?  No/denies          Social History   Tobacco Use  Smoking Status Current Every Day Smoker  . Packs/day: 0.20  . Years: 43.00  . Pack years: 8.60  . Types: Cigarettes  Smokeless Tobacco Never Used  Tobacco Comment   cutting back    Goals Met:  Independence with exercise equipment Exercise tolerated well No report of cardiac concerns or symptoms Strength training completed today  Goals Unmet:  Not Applicable  Comments: Pt able to follow exercise prescription today without complaint.  Will continue to monitor for progression.    Dr. Mark Miller is Medical Director for [facility] and [facility]. 

## 2019-04-07 ENCOUNTER — Telehealth: Payer: Self-pay | Admitting: Internal Medicine

## 2019-04-07 NOTE — Telephone Encounter (Signed)
° °  [facility] Pre-operative Risk Assessment    Request for surgical clearance:  1. What type of surgery is being performed? Extraction   2. When is this surgery scheduled? TBD  3. What type of clearance is required (medical clearance vs. Pharmacy clearance to hold med vs. Both)? Pharmacy   4. Are there any medications that need to be held prior to surgery and how long? Brilinta   5. Practice name and name of physician performing surgery? [facility]   6. What is your office phone number [phone]   7.   What is your office fax number unknown   8.   Anesthesia type (None, local, MAC, general) ? Unknown  PATIENT PCP OFFICE CALLING DENTAL OFFICE CLOSED AND DETAILS UNKNOWN   Clarisse Gouge 04/07/2019, 11:33 AM  _________________________________________________________________   (provider comments below)

## 2019-04-07 NOTE — Telephone Encounter (Signed)
Need to determine the number of teeth extracted. Please confirm with requesting provider. Also determine if any other procedure will be done. Will the procedure be done under local or general anesthesia

## 2019-04-07 NOTE — Telephone Encounter (Signed)
I did try to call the dental office in [place] though the number that was put in on the clearance form is actually to [facility] and not the dental office. I did a Producer, television/film/video for the dental office and found  [facility] [email or web address]  [place], [place], [place] [address]  ~18.5 [address]  [phone]  I am not sure if this is the correct dental office though. I called the number on file for the dental office and the office is currently closed. Only open on M,W,F and Sat. Our office will have to reach back out tomorrow to the dental office. I also tried to reach the pt to see if she could confirm the information of how many teeth though I could not get the call to go through to the pt. I tried x 6. I tried all numbers on file for the pt with no success.

## 2019-04-08 NOTE — Telephone Encounter (Signed)
   Primary Cardiologist: [name], MD  Chart reviewed as part of pre-operative protocol coverage. Simple dental extractions are considered low risk procedures per guidelines and generally do not require any specific cardiac clearance. It is also generally accepted that for simple extractions and dental cleanings, there is no need to interrupt blood thinner therapy.   SBE prophylaxis is not required for the patient.  She had a cardiac stent placed 02/2019 and is recommended for uninterrupted dual antiplatelet therapy for 1 year. Any plans to extract more than 1-2 teeth at a time should be delayed until after 02/2020.   I will route this recommendation to the requesting party via Epic fax function and remove from pre-op pool.  Please call with questions.  Abigail Butts, PA-C 04/08/2019, 11:06 AM

## 2019-04-08 NOTE — Telephone Encounter (Signed)
Spoke with [employer] and states that pt will need 1 tooth extracted and will need full mouth restoration because she has cavities in each remaining tooth. There has been no date scheduled for this annd the dental office wants to know if there are any blood thinners that need to be held. Pt is on Brilinta and ASA. [facility] is going to send over a fax to our office of the clearance that they need.

## 2019-04-11 ENCOUNTER — Other Ambulatory Visit: Payer: Self-pay

## 2019-04-11 ENCOUNTER — Encounter: Payer: Medicare Other | Admitting: *Deleted

## 2019-04-11 ENCOUNTER — Telehealth: Payer: Self-pay | Admitting: Internal Medicine

## 2019-04-11 DIAGNOSIS — Z955 Presence of coronary angioplasty implant and graft: Secondary | ICD-10-CM

## 2019-04-11 NOTE — Telephone Encounter (Signed)
Patient is having sharp pains on left side  Pt c/o of Chest Pain: STAT if CP now or developed within 24 hours  1. Are you having CP right now? yes  2. Are you experiencing any other symptoms (ex. SOB, nausea, vomiting, sweating)?   3. How long have you been experiencing CP? Since yesterday  4. Is your CP continuous or coming and going? Coming and going  5. Have you taken Nitroglycerin? No  Patient is at cardiac rehab right now and they are waiting to see if she should be seen and able to work out ?

## 2019-04-11 NOTE — Telephone Encounter (Signed)
Call received from Collie Siad, RN at [facility] cardiac rehab.  Patient was scheduled for her cardiac rehab appt today. On arrival the patient reported having intermittent CP since yesterday that occurs with exertion. She does not have a Rx for Nitro. The patient is currently asymptomatic. She is scheduled to see Dr. Saunders Revel on 04/13/19. Collie Siad is not going to have the patient exercise today due to her complaint of CP. The patient considered going to the ED yesterday, but decided not to, she has special needs child and needed to be with them. Valla Leaver that I agreed not to exercise the patient today. The patient is not currently having chest pain, she is to keep her 04/13/19 appt with Dr. Saunders Revel. She is to call 911 for any reoccurrence of CP or cardiac symptoms. Collie Siad agrees with the plan and will verbalize the instructions to the patient.

## 2019-04-11 NOTE — Progress Notes (Signed)
Incomplete Session Note  Patient Details  Name: Kimberly Kemp MRN: [id number] Date of Birth: 26-Apr-1965 Referring Provider:     Cardiac Rehab from 03/14/2019 in [facility] Cardiac and Pulmonary Rehab  Referring Provider  End, Harrell Gave MD      [name] did not complete her rehab session.  [name] came in today stating she has been having pains in her left side of chest and sometime aching in her left arm.   She was having symptoms on arrival that resolved as she sat and rested.  Placed a call to her cardiologist office.  ADvised for her to keep appointment she has on Wed this week.  To call 911 if the symptoms return and do not go away before her appointment.  Shared this advice with Kimberly Kemp and she verbalized understanding.

## 2019-04-12 NOTE — Progress Notes (Addendum)
Follow-up Outpatient Visit Date: 04/13/2019  Primary Care Provider: Martin Majestic, FNP [place] [id number]  Chief Complaint: Chest pain  HPI:  Kimberly Kemp is a 54 y.o. female with history of coronary artery disease status post PCI to the RCA complicated by iatrogenic dissection of the RCA (02/22/2019), ischemic cardiomyopathy, hypertension, familial hyperlipidemia, stroke, questionable paroxysmal atrial fibrillation, questionable pulmonary embolism, COPD, colon cancer, hepatitis B, anxiety, and tobacco abuse, who presents for follow-up of coronary artery disease and ischemic cardiomyopathy.  I last saw her on 03/02/2019, at which time she was feeling well with improvement in her breath and absence of chest pain.  She was seen in early February for evaluation of a "knot" in the right antecubital region.  Right radial cath site was healing well without evidence of vascular compromise.  Today, Kimberly Kemp reports having developed sharp and dull chest pain along the left lateral chest wall 4 to 5 days ago.  There was no clear inciting factor and it seems to come and go.  She notices it most when she argues with her husband.  She reports that her breathing has actually been quite good, better compared with before PCI to the RCA.  She has been participating in cardiac rehab and has noted some mild exertional chest tightness when exercising.  This has been stable and is different than the new left lateral chest wall pain.  She has experienced mild dependent edema in her ankles the last few days but has not gained any weight.  She remains compliant with her medications, including dual antiplatelet therapy with aspirin and ticagrelor.  She noticed a small amount of dried blood in her nostrils on rare occasions but otherwise has not had any significant bleeding.  --------------------------------------------------------------------------------------------------  Cardiovascular History &  Procedures: Cardiovascular Problems:  Coronary artery disease s/p PCI to RCA (02/2019)  Paroxysmal atrial fibrillation(questionable; no objective documentation in the patient's chart)  Pulmonary embolism(questionable; no objective documentation in the patient's chart)  Shortness of breath  Risk Factors:  Prior stroke, hypertension, hyperlipidemia, and tobacco use  Cath/PCI:  RHC/LHC and PCI (02/22/2019): LMCA normal.  LAD normal.  LCx normal.  Dominant RCA with 60 to 70% mid vessel stenosis.  RA 9, RV 33/9, PA 26/13, PCWP 13, Fick CO/CI 7.8/4.9.  LVEDP 15.  Attempted IFR of mid RCA complicated by guide wire dissection necessitating stenting of the ostial through distal RCA with overlapping Resolute Onyx stents (2.75 x 38 and 3.0 x 38 mm).  RCA was jailed with loss of flow.  CV Surgery:  None  EP Procedures and Devices:  14-day event monitor (01/05/2019):Predominantly sinus rhythm with rare PAC's and PVC's, as well as rare episodes of brief PSVT. Patient triggered events correspond to sinus rhythm.  Non-Invasive Evaluation(s):  TTE (02/23/2019): Normal LV size and wall thickness.  LVEF 55-60% with inferoseptal akinesis.  Normal RV size and function.  No significant valvular abnormality.  Normal PA pressure.  Dobutamine stress echocardiogram (11/16/2018): Low risk study with normal baseline LVEF and no evidence of inducible wall motion abnormality. LVEF 55-60%.  Exercise tolerance test (09/20/2018): Low to intermediate risk study with decreased exercise capacity and significant motion artifact.  Recent CV Pertinent Labs: Lab Results  Component Value Date   CHOL 305 (H) 09/22/2015   HDL 83 09/22/2015   LDLCALC 174 (H) 09/22/2015   TRIG 241 (H) 09/22/2015   CHOLHDL 3.7 09/22/2015   K 3.8 02/23/2019   BUN 16 02/23/2019   CREATININE 0.90  02/23/2019    Past medical and surgical history were reviewed and updated in EPIC.  Current Meds  Medication Sig  . albuterol  (PROAIR HFA) 108 (90 Base) MCG/ACT inhaler INHALE 2 PUFFS INTO THE LUNGS EVERY FOUR HOURS AS NEEDED FOR WHEEZING (Patient taking differently: Inhale 2 puffs into the lungs every 4 (four) hours as needed for wheezing or shortness of breath. )  . albuterol (PROVENTIL) (2.5 MG/3ML) 0.083% nebulizer solution TAKE 3 MLS (2.5 MG TOTAL) BY NEBULIZATION 4 TIMES DAILY AS NEEDED FOR WHEEZING OR SHORTNESS OF BREATH (Patient taking differently: Take 2.5 mg by nebulization 4 (four) times daily. )  . aspirin EC 81 MG tablet Take 81 mg by mouth daily.  . benzonatate (TESSALON) 200 MG capsule Take 200 mg by mouth 3 (three) times daily as needed for cough.  . budesonide (PULMICORT) 0.25 MG/2ML nebulizer solution TAKE 2 MLS (0.25 MG TOTAL) BY NEBULIZATION 2 (TWO) TIMES DAILY.  . butalbital-acetaminophen-caffeine (FIORICET, ESGIC) 50-325-40 MG tablet Take 1 tablet by mouth 3 (three) times daily as needed for headache.  . famotidine (PEPCID) 40 MG tablet Take 40 mg by mouth at bedtime.  . gabapentin (NEURONTIN) 300 MG capsule Take 300 mg by mouth at bedtime.  Marland Kitchen ipratropium (ATROVENT) 0.02 % nebulizer solution Take 0.5 mg by nebulization 4 (four) times daily.  . magic mouthwash w/lidocaine SOLN Take 10 mLs by mouth 4 (four) times daily as needed for mouth pain. (Patient taking differently: Take 10 mLs by mouth 2 (two) times daily. )  . montelukast (SINGULAIR) 10 MG tablet Take 10 mg by mouth daily.  . Omega-3 Fatty Acids (FISH OIL PO) Take 1 capsule by mouth 2 (two) times daily.  Marland Kitchen omeprazole (PRILOSEC) 40 MG capsule TAKE 1 CAPSULE BY MOUTH EVERY DAY (Patient taking differently: Take 40 mg by mouth 2 (two) times daily. )  . PAZEO 0.7 % SOLN Place 1 drop into both eyes daily.  . promethazine (PHENERGAN) 25 MG tablet Take 25 mg by mouth 4 (four) times daily as needed for nausea or vomiting.   . promethazine-codeine (PHENERGAN WITH CODEINE) 6.25-10 MG/5ML syrup Take 5 mLs by mouth every 6 (six) hours as needed for cough.  (Patient taking differently: Take 5 mLs by mouth at bedtime as needed for cough. )  . rosuvastatin (CRESTOR) 40 MG tablet Take 1 tablet (40 mg total) by mouth daily.  . ticagrelor (BRILINTA) 90 MG TABS tablet Take 1 tablet (90 mg total) by mouth 2 (two) times daily.    Allergies: Penicillins, Omnipaque [iohexol], and Red dye  Social History   Tobacco Use  . Smoking status: Current Every Day Smoker    Packs/day: 0.20    Years: 43.00    Pack years: 8.60    Types: Cigarettes  . Smokeless tobacco: Never Used  . Tobacco comment: cutting back  Substance Use Topics  . Alcohol use: No    Alcohol/week: 0.0 standard drinks  . Drug use: No    Family History  Problem Relation Age of Onset  . Emphysema Father   . Prostate cancer Father   . Liver disease Father   . Emphysema Maternal Grandmother   . Diabetes Mother   . Heart disease Mother 42       CABG and valve replacement  . Colon cancer Neg Hx     Review of Systems: A 12-system review of systems was performed and was negative except as noted in the HPI.  --------------------------------------------------------------------------------------------------  Physical Exam: BP 126/70 (BP Location: Left  Arm, Patient Position: Sitting, Cuff Size: Normal)   Pulse 89   Ht 5\' 3"  (1.6 m)   Wt 127 lb 12 oz (57.9 kg)   LMP 07/18/2016   SpO2 98%   BMI 22.63 kg/m   General: NAD. Neck: No JVD or HJR. Lungs: Mildly diminished breath sounds throughout without wheezes or crackles. Heart: Regular rate and rhythm without murmurs, rubs, or gallops.  No left lateral chest wall tenderness. Abdomen: Soft, nontender, nondistended without HSM. Extremities: No lower extremity edema. Skin: Multiple ecchymoses noted on the right forearm.  EKG: Normal sinus rhythm with biatrial enlargement, low voltage, and inferior infarct.  Poor R wave progression noted.  No significant change from prior tracing on 03/02/2019.  Lab Results  Component Value Date    WBC 11.6 (H) 04/13/2019   HGB 13.4 04/13/2019   HCT 39.7 04/13/2019   MCV 92.1 04/13/2019   PLT 266 04/13/2019    Lab Results  Component Value Date   NA 140 02/23/2019   K 3.8 02/23/2019   CL 110 02/23/2019   CO2 23 02/23/2019   BUN 16 02/23/2019   CREATININE 0.90 02/23/2019   GLUCOSE 129 (H) 02/23/2019   ALT 25 05/06/2018    Lab Results  Component Value Date   CHOL 305 (H) 09/22/2015   HDL 83 09/22/2015   LDLCALC 174 (H) 09/22/2015   TRIG 241 (H) 09/22/2015   CHOLHDL 3.7 09/22/2015    --------------------------------------------------------------------------------------------------  ASSESSMENT AND PLAN: Coronary artery disease with chest pain: Ms. Duane reports overall feeling better compared with before her PCI in regards to her shortness of breath.  She still has some exertional chest tightness when working at cardiac rehab.  Recent sharp and dull left-sided chest pain is atypical and very well may be noncardiac.  EKG today shows evidence of prior inferior MI and borderline poor R wave progression.  Overall, it has not changed significantly since 03/02/2019.  She is currently without chest pain.  We discussed further evaluation in the ED but have agreed to defer this if possible.  I will check a CBC, BMP, and troponin today.  If troponin is elevated, we will refer her to the ED for further management.  I have provided her with a prescription for sublingual nitroglycerin as well as asked her to increase metoprolol tartrate from 12.5 mg twice daily to 25 mg twice daily.  She should continue DAPT with aspirin and ticagrelor.  We will plan to have her stay out of cardiac rehab until she is seen for follow-up in 2 weeks.  I advised her to call 911 if she has worsening chest pain in the meantime.  Hyperlipidemia: Continue rosuvastatin 40 mg daily and follow-up as previously arranged with Dr. Debara Pickett.  Follow-up: Return to clinic in 2 weeks.  [name], MD 04/13/2019 10:42 AM

## 2019-04-13 ENCOUNTER — Encounter: Payer: Self-pay | Admitting: Internal Medicine

## 2019-04-13 ENCOUNTER — Other Ambulatory Visit: Payer: Self-pay

## 2019-04-13 ENCOUNTER — Ambulatory Visit (INDEPENDENT_AMBULATORY_CARE_PROVIDER_SITE_OTHER): Payer: Medicare Other | Admitting: Internal Medicine

## 2019-04-13 ENCOUNTER — Other Ambulatory Visit
Admission: RE | Admit: 2019-04-13 | Discharge: 2019-04-13 | Disposition: A | Discharge status: Home / Self Care | Payer: Medicare Other | Source: Ambulatory Visit | Attending: Internal Medicine | Admitting: Internal Medicine

## 2019-04-13 VITALS — BP 126/70 | HR 89 | Ht 63.0 in | Wt 127.8 lb

## 2019-04-13 DIAGNOSIS — R079 Chest pain, unspecified: Secondary | ICD-10-CM | POA: Diagnosis not present

## 2019-04-13 DIAGNOSIS — I25119 Atherosclerotic heart disease of native coronary artery with unspecified angina pectoris: Secondary | ICD-10-CM

## 2019-04-13 DIAGNOSIS — I10 Essential (primary) hypertension: Secondary | ICD-10-CM | POA: Insufficient documentation

## 2019-04-13 DIAGNOSIS — E785 Hyperlipidemia, unspecified: Secondary | ICD-10-CM

## 2019-04-13 LAB — CBC
HCT: 39.7 % (ref 36.0–46.0)
Hemoglobin: 13.4 g/dL (ref 12.0–15.0)
MCH: 31.1 pg (ref 26.0–34.0)
MCHC: 33.8 g/dL (ref 30.0–36.0)
MCV: 92.1 fL (ref 80.0–100.0)
Platelets: 266 10*3/uL (ref 150–400)
RBC: 4.31 MIL/uL (ref 3.87–5.11)
RDW: 13.6 % (ref 11.5–15.5)
WBC: 11.6 10*3/uL — ABNORMAL HIGH (ref 4.0–10.5)
nRBC: 0 % (ref 0.0–0.2)

## 2019-04-13 LAB — BASIC METABOLIC PANEL
Anion gap: 10 (ref 5–15)
BUN: 17 mg/dL (ref 6–20)
CO2: 25 mmol/L (ref 22–32)
Calcium: 10 mg/dL (ref 8.9–10.3)
Chloride: 105 mmol/L (ref 98–111)
Creatinine, Ser: 0.9 mg/dL (ref 0.44–1.00)
GFR calc Af Amer: >60 mL/min (ref >60)
GFR calc non Af Amer: >60 mL/min (ref >60)
Glucose, Bld: 112 mg/dL — ABNORMAL HIGH (ref 70–99)
Potassium: 4 mmol/L (ref 3.5–5.1)
Sodium: 140 mmol/L (ref 135–145)

## 2019-04-13 LAB — TROPONIN I (HIGH SENSITIVITY): Troponin I (High Sensitivity): 16 ng/L (ref <18)

## 2019-04-13 MED ORDER — METOPROLOL TARTRATE 25 MG PO TABS: 25.0000 mg | ORAL_TABLET | Freq: Two times a day (BID) | ORAL | 3 refills | Status: DC

## 2019-04-13 MED ORDER — NITROGLYCERIN 0.4 MG SL SUBL: 0.4000 mg | SUBLINGUAL_TABLET | SUBLINGUAL | 3 refills | Status: DC | PRN

## 2019-04-13 NOTE — Patient Instructions (Signed)
Medication Instructions:  Your physician has recommended you make the following change in your medication:  1- INCREASE Metoprolol tartrate to 25 mg by mouth two times a day. 2- Nitroglycerin as needed for chest pain - Dissolve 1 tablet (0.4 mg) under your tongue every 5 minutes as needed for chest pain. Do not take more than 3 doses. If chest pain does not resolve, then call 911 or go to the Emergency Room.   *If you need a refill on your cardiac medications before your next appointment, please call your pharmacy*  Lab Work: Your physician recommends that you return for lab work in: [place] as you leave our office at the [facility]. - CBC, BMET, Troponin. - Please go to the [facility]. You will check in at the front desk to the right as you walk into the atrium. Valet Parking is offered if needed. - No appointment needed. You may go any day between 7 am and 6 pm.  If you have labs (blood work) drawn today and your tests are completely normal, you will receive your results only by: Marland Kitchen MyChart Message (if you have MyChart) OR . A paper copy in the mail If you have any lab test that is abnormal or we need to change your treatment, we will call you to review the results.  Testing/Procedures: none  Follow-Up: At [facility], you and your health needs are our priority.  As part of our continuing mission to provide you with exceptional heart care, we have created designated Provider Care Teams.  These Care Teams include your primary Cardiologist (physician) and Advanced Practice Providers (APPs -  Physician Assistants and Nurse Practitioners) who all work together to provide you with the care you need, when you need it.  We recommend signing up for the patient portal called "MyChart".  Sign up information is provided on this After Visit Summary.  MyChart is used to connect with patients for Virtual Visits (Telemedicine).  Patients are able to view lab/test results, encounter notes,  upcoming appointments, etc.  Non-urgent messages can be sent to your provider as well.   To learn more about what you can do with MyChart, go to [email or web address].    Your next appointment:   2 week(s)with Dr End or APP  The format for your next appointment:   In Person  Provider:    You may see [name], MD or one of the following Advanced Practice Providers on your designated Care Team:    Murray Hodgkins, NP  Christell Faith, PA-C  Marrianne Mood, PA-C  Nitroglycerin sublingual tablets What is this medicine? NITROGLYCERIN (nye troe GLI ser in) is a type of vasodilator. It relaxes blood vessels, increasing the blood and oxygen supply to your heart. This medicine is used to relieve chest pain caused by angina. It is also used to prevent chest pain before activities like climbing stairs, going outdoors in cold weather, or sexual activity. This medicine may be used for other purposes; ask your health care provider or pharmacist if you have questions. COMMON BRAND NAME(S): Nitroquick, Nitrostat, Nitrotab What should I tell my health care provider before I take this medicine? They need to know if you have any of these conditions:  anemia  head injury, recent stroke, or bleeding in the brain  liver disease  previous heart attack  an unusual or allergic reaction to nitroglycerin, other medicines, foods, dyes, or preservatives  pregnant or trying to get pregnant  breast-feeding How should I use this medicine?  Take this medicine by mouth as needed. At the first sign of an angina attack (chest pain or tightness) place one tablet under your tongue. You can also take this medicine 5 to 10 minutes before an event likely to produce chest pain. Follow the directions on the prescription label. Let the tablet dissolve under the tongue. Do not swallow whole. Replace the dose if you accidentally swallow it. It will help if your mouth is not dry. Saliva around the tablet will help  it to dissolve more quickly. Do not eat or drink, smoke or chew tobacco while a tablet is dissolving. If you are not better within 5 minutes after taking ONE dose of nitroglycerin, call 9-1-1 immediately to seek emergency medical care. Do not take more than 3 nitroglycerin tablets over 15 minutes. If you take this medicine often to relieve symptoms of angina, your doctor or health care professional may provide you with different instructions to manage your symptoms. If symptoms do not go away after following these instructions, it is important to call 9-1-1 immediately. Do not take more than 3 nitroglycerin tablets over 15 minutes. Talk to your pediatrician regarding the use of this medicine in children. Special care may be needed. Overdosage: If you think you have taken too much of this medicine contact a poison control center or emergency room at once. NOTE: This medicine is only for you. Do not share this medicine with others. What if I miss a dose? This does not apply. This medicine is only used as needed. What may interact with this medicine? Do not take this medicine with any of the following medications:  certain migraine medicines like ergotamine and dihydroergotamine (DHE)  medicines used to treat erectile dysfunction like sildenafil, tadalafil, and vardenafil  riociguat This medicine may also interact with the following medications:  alteplase  aspirin  heparin  medicines for high blood pressure  medicines for mental depression  other medicines used to treat angina  phenothiazines like chlorpromazine, mesoridazine, prochlorperazine, thioridazine This list may not describe all possible interactions. Give your health care provider a list of all the medicines, herbs, non-prescription drugs, or dietary supplements you use. Also tell them if you smoke, drink alcohol, or use illegal drugs. Some items may interact with your medicine. What should I watch for while using this  medicine? Tell your doctor or health care professional if you feel your medicine is no longer working. Keep this medicine with you at all times. Sit or lie down when you take your medicine to prevent falling if you feel dizzy or faint after using it. Try to remain calm. This will help you to feel better faster. If you feel dizzy, take several deep breaths and lie down with your feet propped up, or bend forward with your head resting between your knees. You may get drowsy or dizzy. Do not drive, use machinery, or do anything that needs mental alertness until you know how this drug affects you. Do not stand or sit up quickly, especially if you are an older patient. This reduces the risk of dizzy or fainting spells. Alcohol can make you more drowsy and dizzy. Avoid alcoholic drinks. Do not treat yourself for coughs, colds, or pain while you are taking this medicine without asking your doctor or health care professional for advice. Some ingredients may increase your blood pressure. What side effects may I notice from receiving this medicine? Side effects that you should report to your doctor or health care professional as soon as possible:  blurred vision  dry mouth  skin rash  sweating  the feeling of extreme pressure in the head  unusually weak or tired Side effects that usually do not require medical attention (report to your doctor or health care professional if they continue or are bothersome):  flushing of the face or neck  headache  irregular heartbeat, palpitations  nausea, vomiting This list may not describe all possible side effects. Call your doctor for medical advice about side effects. You may report side effects to FDA at 1-800-FDA-1088. Where should I keep my medicine? Keep out of the reach of children. Store at room temperature between 20 and 25 degrees C (68 and 77 degrees F). Store in [facility] [employer]. Protect from light and moisture. Keep tightly closed. Throw away any  unused medicine after the expiration date. NOTE: This sheet is a summary. It may not cover all possible information. If you have questions about this medicine, talk to your doctor, pharmacist, or health care provider.  2020 Elsevier/Gold Standard (2012-11-18 17:57:36)

## 2019-04-21 ENCOUNTER — Telehealth: Payer: Self-pay | Admitting: Pulmonary Disease

## 2019-04-21 MED ORDER — OMEPRAZOLE 40 MG PO CPDR: DELAYED_RELEASE_CAPSULE | ORAL | 2 refills | Status: DC

## 2019-04-21 NOTE — Telephone Encounter (Signed)
Patient needs refill for Omeprazole and cough medicine. Pharmacy is [facility]. Kimberly Kemp. Patient phone number is [phone].

## 2019-04-21 NOTE — Telephone Encounter (Signed)
ATC patient x1 - no answer and VM box has not been set up yet.  Would like to verify medication name, strength and pharmacy.  Patient is on Pepcid and Omeprazole

## 2019-04-21 NOTE — Telephone Encounter (Signed)
Called spoke with patient, verified patient requesting Omeprazole Rx and the pharmacy.  Rx sent.  Patient is also requesting a refill on her phenergan-codeine cough syrup.  Patient is aware this will have to be approved by Dr. Halford Chessman.  Dr Halford Chessman is not on the schedule again until Monday 3/22.  Please advise on refill of phenergan-codeine cough syrup.  Last refilled 11.4.20 by Eustaquio Maize NP at visit #122mL 50mLs every 6 hours as needed   Patient last seen 11.9.20 for ER follow up visit  Upcoming appt with Dr Halford Chessman on 5.04.21

## 2019-04-22 ENCOUNTER — Telehealth: Payer: Self-pay | Admitting: Pulmonary Disease

## 2019-04-22 NOTE — Telephone Encounter (Signed)
Called and spoke with pt. Pt is requesting to have her promethazine-codeine cough syrup refilled as she states she can afford this better than she can the benzonatate.  Dr. Halford Chessman, please advise if you are okay refilling this for pt.

## 2019-04-23 MED ORDER — PROMETHAZINE-CODEINE 6.25-10 MG/5ML PO SYRP: 5.0000 mL | ORAL_SOLUTION | Freq: Every evening | ORAL | 0 refills | Status: DC | PRN

## 2019-04-23 NOTE — Telephone Encounter (Signed)
Refill for promethazine-codeine sent to her pharmacy.

## 2019-04-25 NOTE — Telephone Encounter (Signed)
Please let her know that refill for phenergan with codeine was sent to her pharmacy.

## 2019-04-25 NOTE — Telephone Encounter (Signed)
Called and spoke with Patient.  Patient aware medication has been sent to pharmacy.  Nothing further at this time.

## 2019-04-25 NOTE — Telephone Encounter (Signed)
Called and spoke with pt letting her know that Dr. Halford Chessman refilled her cough med. Pt verbalized understanding. Nothing further needed.

## 2019-04-27 ENCOUNTER — Encounter: Payer: Self-pay | Admitting: *Deleted

## 2019-04-27 DIAGNOSIS — Z955 Presence of coronary angioplasty implant and graft: Secondary | ICD-10-CM

## 2019-04-27 NOTE — Progress Notes (Signed)
Cardiac Individual Treatment Plan  Patient Details  Name: Kimberly Kemp MRN: [id number] Date of Birth: 30-Jul-1965 Referring Provider:     Cardiac Rehab from 03/14/2019 in [facility] Cardiac and Pulmonary Rehab  Referring Provider  End, Harrell Gave MD      Initial Encounter Date:    Cardiac Rehab from 03/14/2019 in [facility] Cardiac and Pulmonary Rehab  Date  03/14/19      Visit Diagnosis: Status post coronary artery stent placement  Patient's Home Medications on Admission:  Current Outpatient Medications:  .  albuterol (PROAIR HFA) 108 (90 Base) MCG/ACT inhaler, INHALE 2 PUFFS INTO THE LUNGS EVERY FOUR HOURS AS NEEDED FOR WHEEZING (Patient taking differently: Inhale 2 puffs into the lungs every 4 (four) hours as needed for wheezing or shortness of breath. ), Disp: 8.5 Inhaler, Rfl: 5 .  albuterol (PROVENTIL) (2.5 MG/3ML) 0.083% nebulizer solution, TAKE 3 MLS (2.5 MG TOTAL) BY NEBULIZATION 4 TIMES DAILY AS NEEDED FOR WHEEZING OR SHORTNESS OF BREATH (Patient taking differently: Take 2.5 mg by nebulization 4 (four) times daily. ), Disp: 375 mL, Rfl: 3 .  aspirin EC 81 MG tablet, Take 81 mg by mouth daily., Disp: , Rfl:  .  benzonatate (TESSALON) 200 MG capsule, Take 200 mg by mouth 3 (three) times daily as needed for cough., Disp: , Rfl:  .  budesonide (PULMICORT) 0.25 MG/2ML nebulizer solution, TAKE 2 MLS (0.25 MG TOTAL) BY NEBULIZATION 2 (TWO) TIMES DAILY., Disp: 360 mL, Rfl: 1 .  butalbital-acetaminophen-caffeine (FIORICET, ESGIC) 50-325-40 MG tablet, Take 1 tablet by mouth 3 (three) times daily as needed for headache., Disp: , Rfl: 2 .  famotidine (PEPCID) 40 MG tablet, Take 40 mg by mouth at bedtime., Disp: , Rfl:  .  gabapentin (NEURONTIN) 300 MG capsule, Take 300 mg by mouth at bedtime., Disp: , Rfl: 1 .  ipratropium (ATROVENT) 0.02 % nebulizer solution, Take 0.5 mg by nebulization 4 (four) times daily., Disp: , Rfl:  .  magic mouthwash w/lidocaine SOLN, Take 10 mLs by mouth 4 (four) times daily as  needed for mouth pain. (Patient taking differently: Take 10 mLs by mouth 2 (two) times daily. ), Disp: 120 mL, Rfl: 0 .  metoprolol tartrate (LOPRESSOR) 25 MG tablet, Take 1 tablet (25 mg total) by mouth 2 (two) times daily., Disp: 60 tablet, Rfl: 3 .  montelukast (SINGULAIR) 10 MG tablet, Take 10 mg by mouth daily., Disp: , Rfl:  .  nitroGLYCERIN (NITROSTAT) 0.4 MG SL tablet, Place 1 tablet (0.4 mg total) under the tongue every 5 (five) minutes as needed for chest pain. Maximum of 3 doses., Disp: 25 tablet, Rfl: 3 .  Omega-3 Fatty Acids (FISH OIL PO), Take 1 capsule by mouth 2 (two) times daily., Disp: , Rfl:  .  omeprazole (PRILOSEC) 40 MG capsule, TAKE 1 CAPSULE BY MOUTH EVERY DAY, Disp: 30 capsule, Rfl: 2 .  PAZEO 0.7 % SOLN, Place 1 drop into both eyes daily., Disp: , Rfl:  .  promethazine (PHENERGAN) 25 MG tablet, Take 25 mg by mouth 4 (four) times daily as needed for nausea or vomiting. , Disp: , Rfl: 2 .  promethazine-codeine (PHENERGAN WITH CODEINE) 6.25-10 MG/5ML syrup, Take 5 mLs by mouth at bedtime as needed for cough., Disp: 240 mL, Rfl: 0 .  rosuvastatin (CRESTOR) 40 MG tablet, Take 1 tablet (40 mg total) by mouth daily., Disp: 90 tablet, Rfl: 3 .  ticagrelor (BRILINTA) 90 MG TABS tablet, Take 1 tablet (90 mg total) by mouth 2 (two) times daily.,  Disp: 60 tablet, Rfl: 6  Past Medical History: Past Medical History:  Diagnosis Date  . ? h/o Paroxysmal atrial fibrillation (HCC)   . ? h/o Pulmonary embolism ([place])   . Anxiety   . Asthma   . Atypical chest pain    a. 09/2018 ETT: poor exercise tolerance; b. 11/2018 Dobutamine Echo: No wall motion abnormalities, EF 55-60%.  Marland Kitchen CAD (coronary artery disease)    a. 02/2019 Cath/PCI: LM nl, LAD nl, LCX nl, RCA 58m(attempted iFR->wire dissection-->3.0x38 Resolute Onyx DES prox, 2.75x38 Resolute Onyx DES mid), RPDA jailed by stent. EF 65%.  . Chronic pain   . Cirrhosis ([place]   . [place] cancer ([place]   . COPD (chronic obstructive pulmonary  disease) ([place]   . Depression   . Emphysema   . GERD (gastroesophageal reflux disease)   . Heart murmur   . Hepatitis B   . History of echocardiogram    a. 02/2019 Echo: EF 55-60%. No rwma. No significant valvular disease.  .Marland KitchenHyperlipidemia   . Hypertension   . Palpitations    a. 02/2019 Zio: Sinus, avg 83 (55-135). Rare PACs/PVCs. Four atrial runs, longest 7 beats, fastest 146 bpm. Triggered event->sinus rhythm & artifact.  . [place]   . Tobacco abuse     Tobacco Use: Social History   Tobacco Use  Smoking Status Current Every Day Smoker  . Packs/day: 0.20  . Years: 43.00  . Pack years: 8.60  . Types: Cigarettes  Smokeless Tobacco Never Used  Tobacco Comment   cutting back    Labs: Recent Review Flowsheet Data    Labs for ITP Cardiac and Pulmonary Rehab Latest Ref Rng & Units 09/02/2006 09/04/2006 09/22/2015   Cholestrol 0 - 200 mg/dL - - 305(H)   LDLCALC 0 - 99 mg/dL - - 174(H)   HDL >40 mg/dL - - 83   Trlycerides <150 mg/dL - - 241(H)   PHART 7.350 - 7.450 - 7.453(H) 7.500(H)   PCO2ART 35.0 - 45.0 mmHg - 26.1(L) 25.8(L)   HCO3 20.0 - 24.0 mEq/L 17.0(L) 18.0(L) 20.1   TCO2 0 - 100 mmol/L 18 18.8 21   ACIDBASEDEF 0.0 - 2.0 mmol/L 5.0(H) 5.2(H) 2.0   O2SAT % - 99.2 100.0       Exercise Target Goals: Exercise Program Goal: Individual exercise prescription set using results from initial 6 min walk test and THRR while considering  patient's activity barriers and safety.   Exercise Prescription Goal: Initial exercise prescription builds to 30-45 minutes a day of aerobic activity, 2-3 days per week.  Home exercise guidelines will be given to patient during program as part of exercise prescription that the participant will acknowledge.  Activity Barriers & Risk Stratification: Activity Barriers & Cardiac Risk Stratification - 03/14/19 1407      Activity Barriers & Cardiac Risk Stratification   Activity Barriers  Arthritis;Joint Problems;Deconditioning;Muscular  Weakness;Shortness of Breath    Cardiac Risk Stratification  High       6 Minute Walk: 6 Minute Walk    Row Name 03/14/19 1407         6 Minute Walk   Phase  Initial     Distance  1300 feet     Walk Time  6 minutes     # of Rest Breaks  0     MPH  2.46     METS  4.06     RPE  12     VO2 Peak  14.2[place]  Symptoms  No     Resting HR  95 bpm     Resting BP  126/70     Resting Oxygen Saturation   97 %     Exercise Oxygen Saturation  during 6 min walk  99 %     Max Ex. HR  95 bpm     Max Ex. BP  146/74     2 Minute Post BP  122/56        Oxygen Initial Assessment:   Oxygen Re-Evaluation:   Oxygen Discharge (Final Oxygen Re-Evaluation):   Initial Exercise Prescription: Initial Exercise Prescription - 03/14/19 1400      Date of Initial Exercise RX and Referring Provider   Date  03/14/19    Referring Provider  End, Harrell Gave MD      Treadmill   MPH  2.2    Grade  1    Minutes  15    METs  2.99      Recumbant Bike   Level  3    RPM  50    Watts  38    Minutes  15    METs  3      NuStep   Level  3    SPM  80    Minutes  15    METs  3      Elliptical   Level  1    Speed  3    Minutes  15    METs  3      T5 Nustep   Level  3    SPM  80    Minutes  15    METs  3      Prescription Details   Frequency (times per week)  3    Duration  Progress to 30 minutes of continuous aerobic without signs/symptoms of physical distress      Intensity   THRR 40-80% of Max Heartrate  117-150    Ratings of Perceived Exertion  11-13    Perceived Dyspnea  0-4      Progression   Progression  Continue to progress workloads to maintain intensity without signs/symptoms of physical distress.      Resistance Training   Training Prescription  Yes    Weight  3 lb    Reps  10-15       Perform Capillary Blood Glucose checks as needed.  Exercise Prescription Changes: Exercise Prescription Changes    Row Name 03/14/19 1400 04/12/19 1300           Response  to Exercise   Blood Pressure (Admit)  126/70  132/66      Blood Pressure (Exercise)  146/74  142/56      Blood Pressure (Exit)  122/56  102/54      Heart Rate (Admit)  95 bpm  86 bpm      Heart Rate (Exercise)  95 bpm  136 bpm      Heart Rate (Exit)  87 bpm  91 bpm      Oxygen Saturation (Admit)  97 %  --      Oxygen Saturation (Exercise)  99 %  --      Rating of Perceived Exertion (Exercise)  12  17      Symptoms  none   none      Comments  walk test results  second day exercise        Progression   Progression  --  Continue to progress workloads to  maintain intensity without signs/symptoms of physical distress.      Average METs  --  2.99        Resistance Training   Training Prescription  --  Yes      Weight  --  3 lb      Reps  --  10-15        Interval Training   Interval Training  --  No        Treadmill   MPH  --  2.2      Grade  --  1      Minutes  --  15      METs  --  2.99        Elliptical   Level  --  1      Speed  --  3      Minutes  --  15         Exercise Comments:   Exercise Goals and Review: Exercise Goals    [place] Name 03/14/19 1410             Exercise Goals   Increase Physical Activity  Yes       Intervention  Provide advice, education, support and counseling about physical activity/exercise needs.;Develop an individualized exercise prescription for aerobic and resistive training based on initial evaluation findings, risk stratification, comorbidities and participant's personal goals.       Expected Outcomes  Short Term: Attend rehab on a regular basis to increase amount of physical activity.;Long Term: Add in home exercise to make exercise part of routine and to increase amount of physical activity.;Long Term: Exercising regularly at least 3-5 days a week.       Increase Strength and Stamina  Yes       Intervention  Provide advice, education, support and counseling about physical activity/exercise needs.;Develop an individualized exercise  prescription for aerobic and resistive training based on initial evaluation findings, risk stratification, comorbidities and participant's personal goals.       Expected Outcomes  Short Term: Increase workloads from initial exercise prescription for resistance, speed, and METs.;Short Term: Perform resistance training exercises routinely during rehab and add in resistance training at home;Long Term: Improve cardiorespiratory fitness, muscular endurance and strength as measured by increased METs and functional capacity (6MWT)       Able to understand and use rate of perceived exertion (RPE) scale  Yes       Intervention  Provide education and explanation on how to use RPE scale       Expected Outcomes  Short Term: Able to use RPE daily in rehab to express subjective intensity level;Long Term:  Able to use RPE to guide intensity level when exercising independently       Able to understand and use Dyspnea scale  Yes       Intervention  Provide education and explanation on how to use Dyspnea scale       Expected Outcomes  Short Term: Able to use Dyspnea scale daily in rehab to express subjective sense of shortness of breath during exertion;Long Term: Able to use Dyspnea scale to guide intensity level when exercising independently       Knowledge and understanding of Target Heart Rate Range (THRR)  Yes       Intervention  Provide education and explanation of THRR including how the numbers were predicted and where they are located for reference       Expected Outcomes  Short Term: Able to state/look  up THRR;Short Term: Able to use daily as guideline for intensity in rehab;Long Term: Able to use THRR to govern intensity when exercising independently       Able to check pulse independently  Yes       Intervention  Provide education and demonstration on how to check pulse in carotid and radial arteries.;Review the importance of being able to check your own pulse for safety during independent exercise        Expected Outcomes  Short Term: Able to explain why pulse checking is important during independent exercise;Long Term: Able to check pulse independently and accurately       Understanding of Exercise Prescription  Yes       Intervention  Provide education, explanation, and written materials on patient's individual exercise prescription       Expected Outcomes  Short Term: Able to explain program exercise prescription;Long Term: Able to explain home exercise prescription to exercise independently          Exercise Goals Re-Evaluation : Exercise Goals Re-Evaluation    Row Name 03/30/19 1203 04/12/19 1352 04/26/19 1604         Exercise Goal Re-Evaluation   Exercise Goals Review  --  Increase Physical Activity;Increase Strength and Stamina;Able to understand and use rate of perceived exertion (RPE) scale;Able to understand and use Dyspnea scale;Knowledge and understanding of Target Heart Rate Range (THRR);Able to check pulse independently;Understanding of Exercise Prescription  --     Comments  Pt has not attended first full day of rehab yet, been out sick.  See note from yesterday 3/8  Out per MD order until after 3/26     Expected Outcomes  --  Short - see Dr about chest pain Long:  get back to exercise  --        Discharge Exercise Prescription (Final Exercise Prescription Changes): Exercise Prescription Changes - 04/12/19 1300      Response to Exercise   Blood Pressure (Admit)  132/66    Blood Pressure (Exercise)  142/56    Blood Pressure (Exit)  102/54    Heart Rate (Admit)  86 bpm    Heart Rate (Exercise)  136 bpm    Heart Rate (Exit)  91 bpm    Rating of Perceived Exertion (Exercise)  17    Symptoms  none    Comments  second day exercise      Progression   Progression  Continue to progress workloads to maintain intensity without signs/symptoms of physical distress.    Average METs  2.99      Resistance Training   Training Prescription  Yes    Weight  3 lb    Reps  10-15       Interval Training   Interval Training  No      Treadmill   MPH  2.2    Grade  1    Minutes  15    METs  2.99      Elliptical   Level  1    Speed  3    Minutes  15       Nutrition:  Target Goals: Understanding of nutrition guidelines, daily intake of sodium <1533m, cholesterol <2044m calories 30% from fat and 7% or less from saturated fats, daily to have 5 or more servings of fruits and vegetables.  Biometrics: Pre Biometrics - 03/14/19 1410      Pre Biometrics   Height  5' 3.5" (1.613 m)    Weight  130 lb  6.4 oz (59.1 kg)    BMI (Calculated)  22.73    Single Leg Stand  25.35 seconds        Nutrition Therapy Plan and Nutrition Goals: Nutrition Therapy & Goals - 03/23/19 0730      Nutrition Therapy   Diet  HH, Low Na    Protein (specify units)  65-70g    Fiber  25 grams    Whole Grain Foods  3 servings    Saturated Fats  12 max. grams    Fruits and Vegetables  5 servings/day    Sodium  1.5 grams      Personal Nutrition Goals   Nutrition Goal  ST: Mechanical eating - 3 small meals using MyPlate structure with fat LT: RD goal (eat consistent meals) - Unable to fully discuss goals with pt as pt not feeling well    Comments  Pt reports eating chicken or salmon patties and lots of vegetables. Unable to get full diet recall. Pt was not feeling well, but wanted to continue with session. Pt reports eating this way for a while and not knowing what to eat, pt also reports no longer feeling hunger and this was before heart event. Suspect long term low calorie intake, pt at risk for refeeding syndrome - will introduce food slowly; starting with 3 small meals and moving to 6 small meals using mechanical eating. Pt reports no recent weight loss, but said she will know more when she goes to the doctor today. Discussed HH eating, gave examples of each food group and some meal/snack examples, adequate intake, hunger and mechanical eating, and balanced eating without stress. Pt open to  changes. Will follow up.      Intervention Plan   Intervention  Nutrition handout(s) given to patient.;Prescribe, educate and counsel regarding individualized specific dietary modifications aiming towards targeted core components such as weight, hypertension, lipid management, diabetes, heart failure and other comorbidities.    Expected Outcomes  Short Term Goal: Understand basic principles of dietary content, such as calories, fat, sodium, cholesterol and nutrients.;Short Term Goal: A plan has been developed with personal nutrition goals set during dietitian appointment.;Long Term Goal: Adherence to prescribed nutrition plan.       Nutrition Assessments: Nutrition Assessments - 03/14/19 1411      MEDFICTS Scores   Pre Score  3       Nutrition Goals Re-Evaluation:   Nutrition Goals Discharge (Final Nutrition Goals Re-Evaluation):   Psychosocial: Target Goals: Acknowledge presence or absence of significant depression and/or stress, maximize coping skills, provide positive support system. Participant is able to verbalize types and ability to use techniques and skills needed for reducing stress and depression.   Initial Review & Psychosocial Screening: Initial Psych Review & Screening - 03/07/19 1041      Initial Review   Current issues with  Current Stress Concerns    Source of Stress Concerns  Family;Chronic Illness      Family Dynamics   Good Support System?  Yes      Barriers   Psychosocial barriers to participate in program  The patient should benefit from training in stress management and relaxation.;There are no identifiable barriers or psychosocial needs.      Screening Interventions   Interventions  Encouraged to exercise;To provide support and resources with identified psychosocial needs;Provide feedback about the scores to participant    Expected Outcomes  Short Term goal: Utilizing psychosocial counselor, staff and physician to assist with identification of specific  Stressors or current  issues interfering with healing process. Setting desired goal for each stressor or current issue identified.;Long Term Goal: Stressors or current issues are controlled or eliminated.;Short Term goal: Identification and review with participant of any Quality of Life or Depression concerns found by scoring the questionnaire.;Long Term goal: The participant improves quality of Life and PHQ9 Scores as seen by post scores and/or verbalization of changes       Quality of Life Scores:  Quality of Life - 03/14/19 1411      Quality of Life   Select  Quality of Life      Quality of Life Scores   Health/Function Pre  28.8 %    Socioeconomic Pre  28.07 %    Psych/Spiritual Pre  30 %    Family Pre  30 %    GLOBAL Pre  29.07 %      Scores of 19 and below usually indicate a poorer quality of life in these areas.  A difference of  2-3 points is a clinically meaningful difference.  A difference of 2-3 points in the total score of the Quality of Life Index has been associated with significant improvement in overall quality of life, self-image, physical symptoms, and general health in studies assessing change in quality of life.  PHQ-9: Recent Review Flowsheet Data    Depression screen [facility] 2/9 03/14/2019 01/25/2016 10/10/2015   Decreased Interest 0 0 0   Down, Depressed, Hopeless 0 0 0   PHQ - 2 Score 0 0 0   Altered sleeping 0 - -   Tired, decreased energy 0 - -   Change in appetite 0 - -   Feeling bad or failure about yourself  0 - -   Trouble concentrating 0 - -   Moving slowly or fidgety/restless 0 - -   Suicidal thoughts 0 - -   PHQ-9 Score 0 - -   Difficult doing work/chores Not difficult at all - -     Interpretation of Total Score  Total Score Depression Severity:  1-4 = Minimal depression, 5-9 = Mild depression, 10-14 = Moderate depression, 15-19 = Moderately severe depression, 20-27 = Severe depression   Psychosocial Evaluation and Intervention: Psychosocial  Evaluation - 03/07/19 1049      Psychosocial Evaluation & Interventions   Comments  Seraphim recently lost her sister back in December. She did not mention it, but when asked how her stress levels were she stated she just has a lot going on. She has a good support system. She is trying to quit smoking on her own and wants to feel better. She was proud of herself for increasing her activity throughout the day on her own and is excited to start Cardiac Rehab    Expected Outcomes  Short: attend Cardiac Rehab for exercise and education. Long: develop positive self care habits.    Continue Psychosocial Services   Follow up required by staff       Psychosocial Re-Evaluation:   Psychosocial Discharge (Final Psychosocial Re-Evaluation):   Vocational Rehabilitation: Provide vocational rehab assistance to qualifying candidates.   Vocational Rehab Evaluation & Intervention: Vocational Rehab - 03/07/19 1049      Initial Vocational Rehab Evaluation & Intervention   Assessment shows need for Vocational Rehabilitation  No       Education: Education Goals: Education classes will be provided on a variety of topics geared toward better understanding of heart health and risk factor modification. Participant will state understanding/return demonstration of topics presented as noted by education  test scores.  Learning Barriers/Preferences: Learning Barriers/Preferences - 03/07/19 1049      Learning Barriers/Preferences   Learning Barriers  None    Learning Preferences  None       Education Topics:  AED/CPR: - Group verbal and written instruction with the use of models to demonstrate the basic use of the AED with the basic ABC's of resuscitation.   General Nutrition Guidelines/Fats and Fiber: -Group instruction provided by verbal, written material, models and posters to present the general guidelines for heart healthy nutrition. Gives an explanation and review of dietary fats and  fiber.   Controlling Sodium/Reading Food Labels: -Group verbal and written material supporting the discussion of sodium use in heart healthy nutrition. Review and explanation with models, verbal and written materials for utilization of the food label.   Exercise Physiology & General Exercise Guidelines: - Group verbal and written instruction with models to review the exercise physiology of the cardiovascular system and associated critical values. Provides general exercise guidelines with specific guidelines to those with heart or lung disease.    Aerobic Exercise & Resistance Training: - Gives group verbal and written instruction on the various components of exercise. Focuses on aerobic and resistive training programs and the benefits of this training and how to safely progress through these programs..   Flexibility, Balance, Mind/Body Relaxation: Provides group verbal/written instruction on the benefits of flexibility and balance training, including mind/body exercise modes such as yoga, pilates and tai chi.  Demonstration and skill practice provided.   Stress and Anxiety: - Provides group verbal and written instruction about the health risks of elevated stress and causes of high stress.  Discuss the correlation between heart/lung disease and anxiety and treatment options. Review healthy ways to manage with stress and anxiety.   Depression: - Provides group verbal and written instruction on the correlation between heart/lung disease and depressed mood, treatment options, and the stigmas associated with seeking treatment.   Anatomy & Physiology of the Heart: - Group verbal and written instruction and models provide basic cardiac anatomy and physiology, with the coronary electrical and arterial systems. Review of Valvular disease and Heart Failure   Cardiac Procedures: - Group verbal and written instruction to review commonly prescribed medications for heart disease. Reviews the  medication, class of the drug, and side effects. Includes the steps to properly store meds and maintain the prescription regimen. (beta blockers and nitrates)   Cardiac Medications I: - Group verbal and written instruction to review commonly prescribed medications for heart disease. Reviews the medication, class of the drug, and side effects. Includes the steps to properly store meds and maintain the prescription regimen.   Cardiac Medications II: -Group verbal and written instruction to review commonly prescribed medications for heart disease. Reviews the medication, class of the drug, and side effects. (all other drug classes)    Go Sex-Intimacy & Heart Disease, Get SMART - Goal Setting: - Group verbal and written instruction through game format to discuss heart disease and the return to sexual intimacy. Provides group verbal and written material to discuss and apply goal setting through the application of the S.M.A.R.T. Method.   Other Matters of the Heart: - Provides group verbal, written materials and models to describe Stable Angina and Peripheral Artery. Includes description of the disease process and treatment options available to the cardiac patient.   Exercise & Equipment Safety: - Individual verbal instruction and demonstration of equipment use and safety with use of the equipment.   Cardiac Rehab from 03/14/2019  in [facility] Cardiac and Pulmonary Rehab  Date  03/14/19  Educator  [facility]  Instruction Review Code  1- Verbalizes Understanding      Infection Prevention: - Provides verbal and written material to individual with discussion of infection control including proper hand washing and proper equipment cleaning during exercise session.   Cardiac Rehab from 03/14/2019 in [facility] Cardiac and Pulmonary Rehab  Date  03/14/19  Educator  [facility]  Instruction Review Code  1- Verbalizes Understanding      Falls Prevention: - Provides verbal and written material to individual with discussion of  falls prevention and safety.   Cardiac Rehab from 03/14/2019 in [facility] Cardiac and Pulmonary Rehab  Date  03/14/19  Educator  [facility]  Instruction Review Code  1- Verbalizes Understanding      Diabetes: - Individual verbal and written instruction to review signs/symptoms of diabetes, desired ranges of glucose level fasting, after meals and with exercise. Acknowledge that pre and post exercise glucose checks will be done for 3 sessions at entry of program.   Know Your Numbers and Risk Factors: -Group verbal and written instruction about important numbers in your health.  Discussion of what are risk factors and how they play a role in the disease process.  Review of Cholesterol, Blood Pressure, Diabetes, and BMI and the role they play in your overall health.   Sleep Hygiene: -Provides group verbal and written instruction about how sleep can affect your health.  Define sleep hygiene, discuss sleep cycles and impact of sleep habits. Review good sleep hygiene tips.    Other: -Provides group and verbal instruction on various topics (see comments)   Knowledge Questionnaire Score: Knowledge Questionnaire Score - 03/14/19 1411      Knowledge Questionnaire Score   Pre Score  21/26 Education Focus: A&P, Depression, Angina, PAD, Exercise       Core Components/Risk Factors/Patient Goals at Admission: Personal Goals and Risk Factors at Admission - 03/14/19 1412      Core Components/Risk Factors/Patient Goals on Admission    Weight Management  Yes;Weight Maintenance    Intervention  Weight Management: Develop a combined nutrition and exercise program designed to reach desired caloric intake, while maintaining appropriate intake of nutrient and fiber, sodium and fats, and appropriate energy expenditure required for the weight goal.;Weight Management: Provide education and appropriate resources to help participant work on and attain dietary goals.    Admit Weight  130 lb 6.4 oz (59.1 kg)    Goal Weight:  Short Term  130 lb 6.4 oz (59.1 kg)    Goal Weight: Long Term  130 lb (59 kg)    Expected Outcomes  Short Term: Continue to assess and modify interventions until short term weight is achieved;Long Term: Adherence to nutrition and physical activity/exercise program aimed toward attainment of established weight goal;Weight Maintenance: Understanding of the daily nutrition guidelines, which includes 25-35% calories from fat, 7% or less cal from saturated fats, less than 262m cholesterol, less than 1.5gm of sodium, & 5 or more servings of fruits and vegetables daily    Tobacco Cessation  Yes    Number of packs per day  3 cigs    Intervention  Assist the participant in steps to quit. Provide individualized education and counseling about committing to Tobacco Cessation, relapse prevention, and pharmacological support that can be provided by physician.;OAdvice worker assist with locating and accessing local/national Quit Smoking programs, and support quit date choice.    Expected Outcomes  Short Term: Will quit all tobacco  product use, adhering to prevention of relapse plan.;Short Term: Will demonstrate readiness to quit, by selecting a quit date.;Long Term: Complete abstinence from all tobacco products for at least 12 months from quit date.    Hypertension  Yes    Intervention  Provide education on lifestyle modifcations including regular physical activity/exercise, weight management, moderate sodium restriction and increased consumption of fresh fruit, vegetables, and low fat dairy, alcohol moderation, and smoking cessation.;Monitor prescription use compliance.    Expected Outcomes  Short Term: Continued assessment and intervention until BP is < 140/28m HG in hypertensive participants. < 130/888mHG in hypertensive participants with diabetes, heart failure or chronic kidney disease.;Long Term: Maintenance of blood pressure at goal levels.    Lipids  Yes    Intervention  Provide education and  support for participant on nutrition & aerobic/resistive exercise along with prescribed medications to achieve LDL <7034mHDL >37m60m  Expected Outcomes  Short Term: Participant states understanding of desired cholesterol values and is compliant with medications prescribed. Participant is following exercise prescription and nutrition guidelines.;Long Term: Cholesterol controlled with medications as prescribed, with individualized exercise RX and with personalized nutrition plan. Value goals: LDL < 70mg66mL > 40 mg.       Core Components/Risk Factors/Patient Goals Review:    Core Components/Risk Factors/Patient Goals at Discharge (Final Review):    ITP Comments: ITP Comments    Row Name 03/07/19 1046 03/14/19 1406 03/23/19 0730 03/30/19 0621 04/26/19 1603   ITP Comments  Initial phone orientation completed. Diagnosis can be found in [facility] EP Orientation scheduled for 2/8  Completed 6MWT and gym orientation.  Initial ITP created and sent for review to Dr. Mark Emily Filbertical Director.  Completed Initial RD Eval  30 day chart review completed. ITP sent to Dr M MilZachery Dakinscal Director, for review,changes as needed and signature.  Anona Tykerrian hold for two weeks, per MD order.  Follow up scheduled for 3/26 with Dr. End. Saunders Revelow NOptima 04/27/19 0550           ITP Comments  30 day chart review completed. ITP sent to Dr M MilZachery Dakinscal Director, for review,changes as needed and signature. Continue with ITP if no changes requested          Comments:

## 2019-04-29 ENCOUNTER — Ambulatory Visit: Payer: Medicare Other | Admitting: Internal Medicine

## 2019-05-02 DIAGNOSIS — Z955 Presence of coronary angioplasty implant and graft: Secondary | ICD-10-CM

## 2019-05-08 ENCOUNTER — Other Ambulatory Visit: Payer: Self-pay | Admitting: Pulmonary Disease

## 2019-05-09 NOTE — Telephone Encounter (Signed)
Received a refill request from [facility] in [place]. Patient is requesting a refill on her promethazine-codeine 6.25mg -10mg  syrup. This was last refilled on 04/23/19 for 245mL.   Her last OV was 12/13/18. She was advised to follow up in 2 weeks. She is currently scheduled for a follow up on 06/07/19.   VS, please advise if you are ok with refilling this. Thanks!

## 2019-05-12 ENCOUNTER — Encounter: Payer: Self-pay | Admitting: Internal Medicine

## 2019-05-12 ENCOUNTER — Other Ambulatory Visit: Payer: Self-pay

## 2019-05-12 ENCOUNTER — Encounter: Payer: Medicare Other | Attending: Internal Medicine

## 2019-05-12 ENCOUNTER — Ambulatory Visit (INDEPENDENT_AMBULATORY_CARE_PROVIDER_SITE_OTHER): Payer: Medicare Other | Admitting: Internal Medicine

## 2019-05-12 VITALS — BP 112/62 | HR 73 | Ht 63.0 in | Wt 128.2 lb

## 2019-05-12 DIAGNOSIS — Z79899 Other long term (current) drug therapy: Secondary | ICD-10-CM | POA: Insufficient documentation

## 2019-05-12 DIAGNOSIS — Z955 Presence of coronary angioplasty implant and graft: Secondary | ICD-10-CM | POA: Insufficient documentation

## 2019-05-12 DIAGNOSIS — E785 Hyperlipidemia, unspecified: Secondary | ICD-10-CM | POA: Insufficient documentation

## 2019-05-12 DIAGNOSIS — J439 Emphysema, unspecified: Secondary | ICD-10-CM | POA: Insufficient documentation

## 2019-05-12 DIAGNOSIS — Z7901 Long term (current) use of anticoagulants: Secondary | ICD-10-CM | POA: Insufficient documentation

## 2019-05-12 DIAGNOSIS — F17210 Nicotine dependence, cigarettes, uncomplicated: Secondary | ICD-10-CM | POA: Insufficient documentation

## 2019-05-12 DIAGNOSIS — Z7982 Long term (current) use of aspirin: Secondary | ICD-10-CM | POA: Insufficient documentation

## 2019-05-12 DIAGNOSIS — I25118 Atherosclerotic heart disease of native coronary artery with other forms of angina pectoris: Secondary | ICD-10-CM | POA: Diagnosis not present

## 2019-05-12 DIAGNOSIS — I471 Supraventricular tachycardia: Secondary | ICD-10-CM

## 2019-05-12 DIAGNOSIS — I2510 Atherosclerotic heart disease of native coronary artery without angina pectoris: Secondary | ICD-10-CM | POA: Insufficient documentation

## 2019-05-12 DIAGNOSIS — I10 Essential (primary) hypertension: Secondary | ICD-10-CM | POA: Insufficient documentation

## 2019-05-12 DIAGNOSIS — Z8673 Personal history of transient ischemic attack (TIA), and cerebral infarction without residual deficits: Secondary | ICD-10-CM | POA: Insufficient documentation

## 2019-05-12 DIAGNOSIS — R002 Palpitations: Secondary | ICD-10-CM

## 2019-05-12 DIAGNOSIS — K219 Gastro-esophageal reflux disease without esophagitis: Secondary | ICD-10-CM | POA: Insufficient documentation

## 2019-05-12 MED ORDER — METOPROLOL TARTRATE 25 MG PO TABS: 37.5000 mg | ORAL_TABLET | Freq: Two times a day (BID) | ORAL | 1 refills | Status: DC

## 2019-05-12 MED ORDER — OMEPRAZOLE 40 MG PO CPDR: DELAYED_RELEASE_CAPSULE | ORAL | 2 refills | Status: DC

## 2019-05-12 NOTE — Patient Instructions (Signed)
Medication Instructions:  Your physician has recommended you make the following change in your medication:  1- INCREASE Metoprolol to 37.5 mg (1.5 tablet) by mouth two times a day.  *If you need a refill on your cardiac medications before your next appointment, please call your pharmacy*   Lab Work: none If you have labs (blood work) drawn today and your tests are completely normal, you will receive your results only by: Marland Kitchen MyChart Message (if you have MyChart) OR . A paper copy in the mail If you have any lab test that is abnormal or we need to change your treatment, we will call you to review the results.   Testing/Procedures: none   Follow-Up: At [facility], you and your health needs are our priority.  As part of our continuing mission to provide you with exceptional heart care, we have created designated Provider Care Teams.  These Care Teams include your primary Cardiologist (physician) and Advanced Practice Providers (APPs -  Physician Assistants and Nurse Practitioners) who all work together to provide you with the care you need, when you need it.  We recommend signing up for the patient portal called "MyChart".  Sign up information is provided on this After Visit Summary.  MyChart is used to connect with patients for Virtual Visits (Telemedicine).  Patients are able to view lab/test results, encounter notes, upcoming appointments, etc.  Non-urgent messages can be sent to your provider as well.   To learn more about what you can do with MyChart, go to [email or web address].    Your next appointment:   3 month(s)  The format for your next appointment:   In Person  Provider:    You may see [name], MD or one of the following Advanced Practice Providers on your designated Care Team:    Murray Hodgkins, NP  Christell Faith, PA-C  Marrianne Mood, PA-C

## 2019-05-12 NOTE — Progress Notes (Signed)
Follow-up Outpatient Visit Date: 05/12/2019  Primary Care Provider: Martin Majestic, FNP [place] [id number]  Chief Complaint: Follow-up coronary artery disease  HPI:  Kimberly Kemp is a 54 y.o. female with history of coronary artery disease status post PCI to the RCA complicated by iatrogenic dissection of the RCA (02/22/2019), ischemic cardiomyopathy, hypertension, familial hyperlipidemia, stroke, questionable paroxysmal atrial fibrillation, questionable pulmonary embolism, COPD, colon cancer, hepatitis B, anxiety, and tobacco abuse, who presents for follow-up of coronary artery disease and ischemic cardiomyopathy.  I last saw Kimberly Kemp a month ago, at which time she reported having pain along the left lateral chest wall for 4 to 5 days.  Pain would come and go but seem to be most pronounced when she was arguing with her husband.  She also endorsed mild chest tightness when exercising at cardiac rehab.  Her breathing remains significantly better compared to before PCI to the RCA.  High-sensitivity troponin checked at that visit was negative.  We increased metoprolol tartrate from 12.5 mg twice daily to 25 mg twice daily.  Today, Kimberly Kemp reports that she has been doing fairly well.  She notes that her stamina has improved and she was able to mow her entire yard yesterday for the first time in many years.  She continues to have occasional chest pain that is responsive to nitroglycerin, having taken about 8 doses in the last month.  She has not had any significant shortness of breath.  She has experienced some fluttering in her chest at night when she wakes up, though palpitations have improved from prior visits.  She is tolerating increased dose of metoprolol without any side effects.  She was recently advised by her primary care provider that it may be worthwhile for her to begin taking Vascepa, though triglycerides were not elevated on most recent check.  She is due for repeat lipid  panel next week.  She is tolerating rosuvastatin well.  She has not had any bleeding, remaining on aspirin and ticagrelor.  --------------------------------------------------------------------------------------------------  Cardiovascular History & Procedures: Cardiovascular Problems:  Coronary artery disease s/p PCI to RCA (02/2019)  Paroxysmal atrial fibrillation(questionable; no objective documentation in the patient's chart)  Pulmonary embolism(questionable; no objective documentation in the patient's chart)  Shortness of breath  Risk Factors:  Prior stroke, hypertension, hyperlipidemia, and tobacco use  Cath/PCI:  RHC/LHC and PCI (02/22/2019): LMCA normal. LAD normal. LCx normal. Dominant RCA with 60 to 70% mid vessel stenosis. RA 9, RV 33/9, PA 26/13, PCWP 13, Fick CO/CI 7.8/4.9. LVEDP 15. Attempted IFR of mid RCA complicated by guide wire dissection necessitating stenting of the ostial through distal RCA with overlapping Resolute Onyx stents (2.75 x 38 and 3.0 x 38 mm). RCA was jailed with loss of flow.  CV Surgery:  None  EP Procedures and Devices:  14-day event monitor (01/05/2019):Predominantly sinus rhythm with rare PAC's and PVC's, as well as rare episodes of brief PSVT. Patient triggered events correspond to sinus rhythm.  Non-Invasive Evaluation(s):  TTE (02/23/2019): Normal LV size and wall thickness. LVEF 55-60% with inferoseptal akinesis. Normal RV size and function. No significant valvular abnormality. Normal PA pressure.  Dobutamine stress echocardiogram (11/16/2018): Low risk study with normal baseline LVEF and no evidence of inducible wall motion abnormality. LVEF 55-60%.  Exercise tolerance test (09/20/2018): Low to intermediate risk study with decreased exercise capacity and significant motion artifact.  Recent CV Pertinent Labs: Lab Results  Component Value Date   CHOL 305 (H) 09/22/2015  HDL 83 09/22/2015   LDLCALC 174 (H)  09/22/2015   TRIG 241 (H) 09/22/2015   CHOLHDL 3.7 09/22/2015   K 4.0 04/13/2019   BUN 17 04/13/2019   CREATININE 0.90 04/13/2019    Past medical and surgical history were reviewed and updated in EPIC.  Current Meds  Medication Sig  . albuterol (PROAIR HFA) 108 (90 Base) MCG/ACT inhaler INHALE 2 PUFFS INTO THE LUNGS EVERY FOUR HOURS AS NEEDED FOR WHEEZING (Patient taking differently: Inhale 2 puffs into the lungs every 4 (four) hours as needed for wheezing or shortness of breath. )  . albuterol (PROVENTIL) (2.5 MG/3ML) 0.083% nebulizer solution TAKE 3 MLS (2.5 MG TOTAL) BY NEBULIZATION 4 TIMES DAILY AS NEEDED FOR WHEEZING OR SHORTNESS OF BREATH (Patient taking differently: Take 2.5 mg by nebulization 4 (four) times daily. )  . aspirin EC 81 MG tablet Take 81 mg by mouth daily.  . benzonatate (TESSALON) 200 MG capsule Take 200 mg by mouth 3 (three) times daily as needed for cough.  . budesonide (PULMICORT) 0.25 MG/2ML nebulizer solution TAKE 2 MLS (0.25 MG TOTAL) BY NEBULIZATION 2 (TWO) TIMES DAILY.  . butalbital-acetaminophen-caffeine (FIORICET, ESGIC) 50-325-40 MG tablet Take 1 tablet by mouth 3 (three) times daily as needed for headache.  . famotidine (PEPCID) 40 MG tablet Take 40 mg by mouth at bedtime.  . gabapentin (NEURONTIN) 300 MG capsule Take 300 mg by mouth at bedtime.  Marland Kitchen ipratropium (ATROVENT) 0.02 % nebulizer solution Take 0.5 mg by nebulization 4 (four) times daily.  . magic mouthwash w/lidocaine SOLN Take 10 mLs by mouth 4 (four) times daily as needed for mouth pain. (Patient taking differently: Take 10 mLs by mouth 2 (two) times daily. )  . metoprolol tartrate (LOPRESSOR) 25 MG tablet Take 1 tablet (25 mg total) by mouth 2 (two) times daily.  . montelukast (SINGULAIR) 10 MG tablet Take 10 mg by mouth daily.  . nitroGLYCERIN (NITROSTAT) 0.4 MG SL tablet Place 1 tablet (0.4 mg total) under the tongue every 5 (five) minutes as needed for chest pain. Maximum of 3 doses.  . Omega-3  Fatty Acids (FISH OIL PO) Take 1 capsule by mouth 2 (two) times daily.  Marland Kitchen omeprazole (PRILOSEC) 40 MG capsule TAKE 1 CAPSULE BY MOUTH EVERY DAY  . PAZEO 0.7 % SOLN Place 1 drop into both eyes daily.  . promethazine (PHENERGAN) 25 MG tablet Take 25 mg by mouth 4 (four) times daily as needed for nausea or vomiting.   . promethazine-codeine (PHENERGAN WITH CODEINE) 6.25-10 MG/5ML syrup Take 5 mLs by mouth at bedtime as needed for cough.  . ticagrelor (BRILINTA) 90 MG TABS tablet Take 1 tablet (90 mg total) by mouth 2 (two) times daily.    Allergies: Penicillins, Omnipaque [iohexol], and Red dye  Social History   Tobacco Use  . Smoking status: Current Every Day Smoker    Packs/day: 0.20    Years: 43.00    Pack years: 8.60    Types: Cigarettes  . Smokeless tobacco: Never Used  . Tobacco comment: cutting back  Substance Use Topics  . Alcohol use: No    Alcohol/week: 0.0 standard drinks  . Drug use: No    Family History  Problem Relation Age of Onset  . Emphysema Father   . Prostate cancer Father   . Liver disease Father   . Emphysema Maternal Grandmother   . Diabetes Mother   . Heart disease Mother 46       CABG and valve replacement  .  Colon cancer Neg Hx     Review of Systems: A 12-system review of systems was performed and was negative except as noted in the HPI.  --------------------------------------------------------------------------------------------------  Physical Exam: BP 112/62 (BP Location: Left Arm, Patient Position: Sitting, Cuff Size: Normal)   Pulse 73   Ht 5\' 3"  (1.6 m)   Wt 128 lb 4 oz (58.2 kg)   LMP 07/18/2016   SpO2 98%   BMI 22.72 kg/m   General: NAD. Neck: No JVD or HJR. Lungs: Mildly diminished breath sounds throughout without wheezes or crackles. Heart: Regular rate and rhythm without murmurs, rubs, or gallops. Amities: No lower extremity edema.  2+ radial pulses.  EKG: Normal sinus rhythm with inferior Q waves.  No significant change  since 04/13/2019.  Lab Results  Component Value Date   WBC 11.6 (H) 04/13/2019   HGB 13.4 04/13/2019   HCT 39.7 04/13/2019   MCV 92.1 04/13/2019   PLT 266 04/13/2019    Lab Results  Component Value Date   NA 140 04/13/2019   K 4.0 04/13/2019   CL 105 04/13/2019   CO2 25 04/13/2019   BUN 17 04/13/2019   CREATININE 0.90 04/13/2019   GLUCOSE 112 (H) 04/13/2019   ALT 25 05/06/2018    Lab Results  Component Value Date   CHOL 305 (H) 09/22/2015   HDL 83 09/22/2015   LDLCALC 174 (H) 09/22/2015   TRIG 241 (H) 09/22/2015   CHOLHDL 3.7 09/22/2015    --------------------------------------------------------------------------------------------------  ASSESSMENT AND PLAN: Coronary artery disease with stable angina: Overall, Kimberly Kemp is doing better compared to before her PCI despite iatrogenic closure of the PDA.  She has noticed some improvement with metoprolol.  We have agreed to increase this to 37.5 mg twice daily.  She will complete at least 6 months of dual antiplatelet therapy, though I favor a longer treatment given the long stented segment of the RCA.  If she continues to have chest pain, we could consider adding a long-acting nitrate and will ranolazine in the future.  Palpitations and PSVT: Prior monitor showed brief runs of PSVT, which may be what she is experiencing at night.  We will increase metoprolol, as above.  Hyperlipidemia: Outside labs in January are notable for total cholesterol of 333, LDL of 246, HDL 62, and triglycerides of 127.  We will plan to continue rosuvastatin 40 mg daily with follow-up in the lipid clinic.  Given that triglycerides are less than 150, there is no indication for use of Vascepa at this time.  I have asked Kimberly Kemp to have her PCP forward follow-up lipids to be drawn in the next week or 2 to our office.  Follow-up: Return to clinic in 3 months  [name], MD 05/12/2019 9:59 AM

## 2019-05-16 ENCOUNTER — Telehealth: Payer: Self-pay | Admitting: *Deleted

## 2019-05-16 NOTE — Telephone Encounter (Signed)
-----   Message from [name], MD sent at 05/16/2019 12:56 PM EDT ----- Regarding: RE: Return to Cardiac Rehab? Yes, she may return.  Thanks.  Gerald Stabs ----- Message ----- From: Clotilde Dieter Sent: 05/16/2019  12:54 PM EDT To: [name], MD Subject: Return to Cardiac Rehab?                       Dr. Saunders Revel,  Is Milea now able to return to cardiac rehab safely?  Please let us know. Thanks for your help! Alberteen Sam, MA, RCEP, CCRP 05/16/2019 12:55 PM

## 2019-05-18 DIAGNOSIS — J449 Chronic obstructive pulmonary disease, unspecified: Secondary | ICD-10-CM | POA: Diagnosis not present

## 2019-05-18 DIAGNOSIS — E559 Vitamin D deficiency, unspecified: Secondary | ICD-10-CM | POA: Diagnosis not present

## 2019-05-18 DIAGNOSIS — I4891 Unspecified atrial fibrillation: Secondary | ICD-10-CM | POA: Diagnosis not present

## 2019-05-18 DIAGNOSIS — E7800 Pure hypercholesterolemia, unspecified: Secondary | ICD-10-CM | POA: Diagnosis not present

## 2019-05-23 ENCOUNTER — Encounter: Payer: Self-pay | Admitting: *Deleted

## 2019-05-23 DIAGNOSIS — Z955 Presence of coronary angioplasty implant and graft: Secondary | ICD-10-CM

## 2019-05-25 ENCOUNTER — Ambulatory Visit: Payer: Medicare Other | Admitting: Internal Medicine

## 2019-05-25 ENCOUNTER — Encounter: Payer: Self-pay | Admitting: *Deleted

## 2019-05-25 DIAGNOSIS — Z955 Presence of coronary angioplasty implant and graft: Secondary | ICD-10-CM

## 2019-05-25 NOTE — Progress Notes (Signed)
Cardiac Individual Treatment Plan  Patient Details  Name: Kimberly Kemp MRN: [id number] Date of Birth: [birth year] Referring Provider:     Cardiac Rehab from 03/14/2019 in [facility] Cardiac and Pulmonary Rehab  Referring Provider  End, Harrell Gave MD      Initial Encounter Date:    Cardiac Rehab from 03/14/2019 in [facility] Cardiac and Pulmonary Rehab  Date  03/14/19      Visit Diagnosis: Status post coronary artery stent placement  Patient's Home Medications on Admission:  Current Outpatient Medications:  .  albuterol (PROAIR HFA) 108 (90 Base) MCG/ACT inhaler, INHALE 2 PUFFS INTO THE LUNGS EVERY FOUR HOURS AS NEEDED FOR WHEEZING (Patient taking differently: Inhale 2 puffs into the lungs every 4 (four) hours as needed for wheezing or shortness of breath. ), Disp: 8.5 Inhaler, Rfl: 5 .  albuterol (PROVENTIL) (2.5 MG/3ML) 0.083% nebulizer solution, TAKE 3 MLS (2.5 MG TOTAL) BY NEBULIZATION 4 TIMES DAILY AS NEEDED FOR WHEEZING OR SHORTNESS OF BREATH (Patient taking differently: Take 2.5 mg by nebulization 4 (four) times daily. ), Disp: 375 mL, Rfl: 3 .  aspirin EC 81 MG tablet, Take 81 mg by mouth daily., Disp: , Rfl:  .  benzonatate (TESSALON) 200 MG capsule, Take 200 mg by mouth 3 (three) times daily as needed for cough., Disp: , Rfl:  .  budesonide (PULMICORT) 0.25 MG/2ML nebulizer solution, TAKE 2 MLS (0.25 MG TOTAL) BY NEBULIZATION 2 (TWO) TIMES DAILY., Disp: 360 mL, Rfl: 1 .  butalbital-acetaminophen-caffeine (FIORICET, ESGIC) 50-325-40 MG tablet, Take 1 tablet by mouth 3 (three) times daily as needed for headache., Disp: , Rfl: 2 .  famotidine (PEPCID) 40 MG tablet, Take 40 mg by mouth at bedtime., Disp: , Rfl:  .  gabapentin (NEURONTIN) 300 MG capsule, Take 300 mg by mouth at bedtime., Disp: , Rfl: 1 .  ipratropium (ATROVENT) 0.02 % nebulizer solution, Take 0.5 mg by nebulization 4 (four) times daily., Disp: , Rfl:  .  magic mouthwash w/lidocaine SOLN, Take 10 mLs by mouth 4 (four) times daily as  needed for mouth pain. (Patient taking differently: Take 10 mLs by mouth 2 (two) times daily. ), Disp: 120 mL, Rfl: 0 .  metoprolol tartrate (LOPRESSOR) 25 MG tablet, Take 1.5 tablets (37.5 mg total) by mouth 2 (two) times daily., Disp: 270 tablet, Rfl: 1 .  montelukast (SINGULAIR) 10 MG tablet, Take 10 mg by mouth daily., Disp: , Rfl:  .  nitroGLYCERIN (NITROSTAT) 0.4 MG SL tablet, Place 1 tablet (0.4 mg total) under the tongue every 5 (five) minutes as needed for chest pain. Maximum of 3 doses., Disp: 25 tablet, Rfl: 3 .  Omega-3 Fatty Acids (FISH OIL PO), Take 1 capsule by mouth 2 (two) times daily., Disp: , Rfl:  .  omeprazole (PRILOSEC) 40 MG capsule, TAKE 1 CAPSULE BY MOUTH EVERY DAY, Disp: 30 capsule, Rfl: 2 .  PAZEO 0.7 % SOLN, Place 1 drop into both eyes daily., Disp: , Rfl:  .  promethazine (PHENERGAN) 25 MG tablet, Take 25 mg by mouth 4 (four) times daily as needed for nausea or vomiting. , Disp: , Rfl: 2 .  promethazine-codeine (PHENERGAN WITH CODEINE) 6.25-10 MG/5ML syrup, Take 5 mLs by mouth at bedtime as needed for cough., Disp: 240 mL, Rfl: 0 .  rosuvastatin (CRESTOR) 40 MG tablet, Take 1 tablet (40 mg total) by mouth daily., Disp: 90 tablet, Rfl: 3 .  ticagrelor (BRILINTA) 90 MG TABS tablet, Take 1 tablet (90 mg total) by mouth 2 (two) times daily.,  Disp: 60 tablet, Rfl: 6  Past Medical History: Past Medical History:  Diagnosis Date  . ? h/o Paroxysmal atrial fibrillation (HCC)   . ? h/o Pulmonary embolism ([place])   . Anxiety   . Asthma   . Atypical chest pain    a. 09/2018 ETT: poor exercise tolerance; b. 11/2018 Dobutamine Echo: No wall motion abnormalities, EF 55-60%.  Marland Kitchen CAD (coronary artery disease)    a. 02/2019 Cath/PCI: LM nl, LAD nl, LCX nl, RCA 34m(attempted iFR->wire dissection-->3.0x38 Resolute Onyx DES prox, 2.75x38 Resolute Onyx DES mid), RPDA jailed by stent. EF 65%.  . Chronic pain   . Cirrhosis ([place]   . Colon cancer ([place]   . COPD (chronic obstructive  pulmonary disease) ([place]   . Depression   . Emphysema   . GERD (gastroesophageal reflux disease)   . Heart murmur   . Hepatitis B   . History of echocardiogram    a. 02/2019 Echo: EF 55-60%. No rwma. No significant valvular disease.  .Marland KitchenHyperlipidemia   . Hypertension   . Palpitations    a. 02/2019 Zio: Sinus, avg 83 (55-135). Rare PACs/PVCs. Four atrial runs, longest 7 beats, fastest 146 bpm. Triggered event->sinus rhythm & artifact.  . Stroke ([place]   . Tobacco abuse     Tobacco Use: Social History   Tobacco Use  Smoking Status Current Every Day Smoker  . Packs/day: 0.20  . Years: 43.00  . Pack years: 8.60  . Types: Cigarettes  Smokeless Tobacco Never Used  Tobacco Comment   cutting back    Labs: Recent Review Flowsheet Data    Labs for ITP Cardiac and Pulmonary Rehab Latest Ref Rng & Units 09/02/2006 09/04/2006 09/22/2015   Cholestrol 0 - 200 mg/dL - - 305(H)   LDLCALC 0 - 99 mg/dL - - 174(H)   HDL >40 mg/dL - - 83   Trlycerides <150 mg/dL - - 241(H)   PHART 7.350 - 7.450 - 7.453(H) 7.500(H)   PCO2ART 35.0 - 45.0 mmHg - 26.1(L) 25.8(L)   HCO3 20.0 - 24.0 mEq/L 17.0(L) 18.0(L) 20.1   TCO2 0 - 100 mmol/L 18 18.8 21   ACIDBASEDEF 0.0 - 2.0 mmol/L 5.0(H) 5.2(H) 2.0   O2SAT % - 99.2 100.0       Exercise Target Goals: Exercise Program Goal: Individual exercise prescription set using results from initial 6 min walk test and THRR while considering  patient's activity barriers and safety.   Exercise Prescription Goal: Initial exercise prescription builds to 30-45 minutes a day of aerobic activity, 2-3 days per week.  Home exercise guidelines will be given to patient during program as part of exercise prescription that the participant will acknowledge.   Education: Aerobic Exercise & Resistance Training: - Gives group verbal and written instruction on the various components of exercise. Focuses on aerobic and resistive training programs and the benefits of this training and  how to safely progress through these programs..   Education: Exercise & Equipment Safety: - Individual verbal instruction and demonstration of equipment use and safety with use of the equipment.   [facility]  Date  03/14/19  [facility]  [facility]  1- Verbalizes Understanding      Education: Exercise Physiology & General Exercise Guidelines: - Group verbal and written instruction with models to review the exercise physiology of the cardiovascular system and associated critical values. Provides general exercise guidelines with specific guidelines to those with heart or lung  disease.    Education: Flexibility, Balance, Mind/Body Relaxation: Provides group verbal/written instruction on the benefits of flexibility and balance training, including mind/body exercise modes such as yoga, pilates and tai chi.  Demonstration and skill practice provided.   Activity Barriers & Risk Stratification: Activity Barriers & Cardiac Risk Stratification - 03/14/19 1407      Activity Barriers & Cardiac Risk Stratification   Activity Barriers  Arthritis;Joint Problems;Deconditioning;Muscular Weakness;Shortness of Breath    Cardiac Risk Stratification  High       6 Minute Walk: 6 Minute Walk    Row Name 03/14/19 1407         6 Minute Walk   Phase  Initial     Distance  1300 feet     Walk Time  6 minutes     # of Rest Breaks  0     MPH  2.46     METS  4.06     RPE  12     VO2 Peak  14.22     Symptoms  No     Resting HR  95 bpm     Resting BP  126/70     Resting Oxygen Saturation   97 %     Exercise Oxygen Saturation  during 6 min walk  99 %     Max Ex. HR  95 bpm     Max Ex. BP  146/74     2 Minute Post BP  122/56        Oxygen Initial Assessment:   Oxygen Re-Evaluation:   Oxygen Discharge (Final Oxygen Re-Evaluation):   Initial Exercise Prescription: Initial Exercise Prescription - 03/14/19 1400      Date  of Initial Exercise RX and Referring Provider   Date  03/14/19    Referring Provider  End, Harrell Gave MD      Treadmill   MPH  2.2    Grade  1    Minutes  15    METs  2.99      Recumbant Bike   Level  3    RPM  50    Watts  38    Minutes  15    METs  3      NuStep   Level  3    SPM  80    Minutes  15    METs  3      Elliptical   Level  1    Speed  3    Minutes  15    METs  3      T5 Nustep   Level  3    SPM  80    Minutes  15    METs  3      Prescription Details   Frequency (times per week)  3    Duration  Progress to 30 minutes of continuous aerobic without signs/symptoms of physical distress      Intensity   THRR 40-80% of Max Heartrate  117-150    Ratings of Perceived Exertion  11-13    Perceived Dyspnea  0-4      Progression   Progression  Continue to progress workloads to maintain intensity without signs/symptoms of physical distress.      Resistance Training   Training Prescription  Yes    Weight  3 lb    Reps  10-15       Perform Capillary Blood Glucose checks as needed.  Exercise Prescription Changes: Exercise Prescription Changes  [place] Name 03/14/19 1400 04/12/19 1300           Response to Exercise   Blood Pressure (Admit)  126/70  132/66      Blood Pressure (Exercise)  146/74  142/56      Blood Pressure (Exit)  122/56  102/54      Heart Rate (Admit)  95 bpm  86 bpm      Heart Rate (Exercise)  95 bpm  136 bpm      Heart Rate (Exit)  87 bpm  91 bpm      Oxygen Saturation (Admit)  97 %  --      Oxygen Saturation (Exercise)  99 %  --      Rating of Perceived Exertion (Exercise)  12  17      Symptoms  none   none      Comments  walk test results  second day exercise        Progression   Progression  --  Continue to progress workloads to maintain intensity without signs/symptoms of physical distress.      Average METs  --  2.99        Resistance Training   Training Prescription  --  Yes      Weight  --  3 lb      Reps  --  10-15         Interval Training   Interval Training  --  No        Treadmill   MPH  --  2.2      Grade  --  1      Minutes  --  15      METs  --  2.99        Elliptical   Level  --  1      Speed  --  3      Minutes  --  15         Exercise Comments:   Exercise Goals and Review: Exercise Goals    [place] Name 03/14/19 1410             Exercise Goals   Increase Physical Activity  Yes       Intervention  Provide advice, education, support and counseling about physical activity/exercise needs.;Develop an individualized exercise prescription for aerobic and resistive training based on initial evaluation findings, risk stratification, comorbidities and participant's personal goals.       Expected Outcomes  Short Term: Attend rehab on a regular basis to increase amount of physical activity.;Long Term: Add in home exercise to make exercise part of routine and to increase amount of physical activity.;Long Term: Exercising regularly at least 3-5 days a week.       Increase Strength and Stamina  Yes       Intervention  Provide advice, education, support and counseling about physical activity/exercise needs.;Develop an individualized exercise prescription for aerobic and resistive training based on initial evaluation findings, risk stratification, comorbidities and participant's personal goals.       Expected Outcomes  Short Term: Increase workloads from initial exercise prescription for resistance, speed, and METs.;Short Term: Perform resistance training exercises routinely during rehab and add in resistance training at home;Long Term: Improve cardiorespiratory fitness, muscular endurance and strength as measured by increased METs and functional capacity (6MWT)       Able to understand and use rate of perceived exertion (RPE) scale  Yes       Intervention  Provide  education and explanation on how to use RPE scale       Expected Outcomes  Short Term: Able to use RPE daily in rehab to express  subjective intensity level;Long Term:  Able to use RPE to guide intensity level when exercising independently       Able to understand and use Dyspnea scale  Yes       Intervention  Provide education and explanation on how to use Dyspnea scale       Expected Outcomes  Short Term: Able to use Dyspnea scale daily in rehab to express subjective sense of shortness of breath during exertion;Long Term: Able to use Dyspnea scale to guide intensity level when exercising independently       Knowledge and understanding of Target Heart Rate Range (THRR)  Yes       Intervention  Provide education and explanation of THRR including how the numbers were predicted and where they are located for reference       Expected Outcomes  Short Term: Able to state/look up THRR;Short Term: Able to use daily as guideline for intensity in rehab;Long Term: Able to use THRR to govern intensity when exercising independently       Able to check pulse independently  Yes       Intervention  Provide education and demonstration on how to check pulse in carotid and radial arteries.;Review the importance of being able to check your own pulse for safety during independent exercise       Expected Outcomes  Short Term: Able to explain why pulse checking is important during independent exercise;Long Term: Able to check pulse independently and accurately       Understanding of Exercise Prescription  Yes       Intervention  Provide education, explanation, and written materials on patient's individual exercise prescription       Expected Outcomes  Short Term: Able to explain program exercise prescription;Long Term: Able to explain home exercise prescription to exercise independently          Exercise Goals Re-Evaluation : Exercise Goals Re-Evaluation    Row Name 03/30/19 1203 04/12/19 1352 04/26/19 1604 05/23/19 1646       Exercise Goal Re-Evaluation   Exercise Goals Review  --  Increase Physical Activity;Increase Strength and Stamina;Able  to understand and use rate of perceived exertion (RPE) scale;Able to understand and use Dyspnea scale;Knowledge and understanding of Target Heart Rate Range (THRR);Able to check pulse independently;Understanding of Exercise Prescription  --  --    Comments  Pt has not attended first full day of rehab yet, been out sick.  See note from yesterday 3/8  Out per MD order until after 3/26  Out since last review.    Expected Outcomes  --  Short - see Dr about chest pain Long:  get back to exercise  --  --       Discharge Exercise Prescription (Final Exercise Prescription Changes): Exercise Prescription Changes - 04/12/19 1300      Response to Exercise   Blood Pressure (Admit)  132/66    Blood Pressure (Exercise)  142/56    Blood Pressure (Exit)  102/54    Heart Rate (Admit)  86 bpm    Heart Rate (Exercise)  136 bpm    Heart Rate (Exit)  91 bpm    Rating of Perceived Exertion (Exercise)  17    Symptoms  none    Comments  second day exercise      Progression  Progression  Continue to progress workloads to maintain intensity without signs/symptoms of physical distress.    Average METs  2.99      Resistance Training   Training Prescription  Yes    Weight  3 lb    Reps  10-15      Interval Training   Interval Training  No      Treadmill   MPH  2.2    Grade  1    Minutes  15    METs  2.99      Elliptical   Level  1    Speed  3    Minutes  15       Nutrition:  Target Goals: Understanding of nutrition guidelines, daily intake of sodium <1533m, cholesterol <2056m calories 30% from fat and 7% or less from saturated fats, daily to have 5 or more servings of fruits and vegetables.  Education: Controlling Sodium/Reading Food Labels -Group verbal and written material supporting the discussion of sodium use in heart healthy nutrition. Review and explanation with models, verbal and written materials for utilization of the food label.   Education: General Nutrition Guidelines/Fats and  Fiber: -Group instruction provided by verbal, written material, models and posters to present the general guidelines for heart healthy nutrition. Gives an explanation and review of dietary fats and fiber.   Biometrics: Pre Biometrics - 03/14/19 1410      Pre Biometrics   Height  5' 3.5" (1.613 m)    Weight  130 lb 6.4 oz (59.1 kg)    BMI (Calculated)  22.73    Single Leg Stand  25.35 seconds        Nutrition Therapy Plan and Nutrition Goals: Nutrition Therapy & Goals - 03/23/19 0730      Nutrition Therapy   Diet  HH, Low Na    Protein (specify units)  65-70g    Fiber  25 grams    Whole Grain Foods  3 servings    Saturated Fats  12 max. grams    Fruits and Vegetables  5 servings/day    Sodium  1.5 grams      Personal Nutrition Goals   Nutrition Goal  ST: Mechanical eating - 3 small meals using MyPlate structure with fat LT: RD goal (eat consistent meals) - Unable to fully discuss goals with pt as pt not feeling well    Comments  Pt reports eating chicken or salmon patties and lots of vegetables. Unable to get full diet recall. Pt was not feeling well, but wanted to continue with session. Pt reports eating this way for a while and not knowing what to eat, pt also reports no longer feeling hunger and this was before heart event. Suspect long term low calorie intake, pt at risk for refeeding syndrome - will introduce food slowly; starting with 3 small meals and moving to 6 small meals using mechanical eating. Pt reports no recent weight loss, but said she will know more when she goes to the doctor today. Discussed HH eating, gave examples of each food group and some meal/snack examples, adequate intake, hunger and mechanical eating, and balanced eating without stress. Pt open to changes. Will follow up.      Intervention Plan   Intervention  Nutrition handout(s) given to patient.;Prescribe, educate and counsel regarding individualized specific dietary modifications aiming towards  targeted core components such as weight, hypertension, lipid management, diabetes, heart failure and other comorbidities.    Expected Outcomes  Short Term Goal: Understand basic  principles of dietary content, such as calories, fat, sodium, cholesterol and nutrients.;Short Term Goal: A plan has been developed with personal nutrition goals set during dietitian appointment.;Long Term Goal: Adherence to prescribed nutrition plan.       Nutrition Assessments: Nutrition Assessments - 03/14/19 1411      MEDFICTS Scores   Pre Score  3       MEDIFICTS Score Key:          ?70 Need to make dietary changes          40-70 Heart Healthy Diet         ? 40 Therapeutic Level Cholesterol Diet  Nutrition Goals Re-Evaluation:   Nutrition Goals Discharge (Final Nutrition Goals Re-Evaluation):   Psychosocial: Target Goals: Acknowledge presence or absence of significant depression and/or stress, maximize coping skills, provide positive support system. Participant is able to verbalize types and ability to use techniques and skills needed for reducing stress and depression.   Education: Depression - Provides group verbal and written instruction on the correlation between heart/lung disease and depressed mood, treatment options, and the stigmas associated with seeking treatment.   Education: Sleep Hygiene -Provides group verbal and written instruction about how sleep can affect your health.  Define sleep hygiene, discuss sleep cycles and impact of sleep habits. Review good sleep hygiene tips.     Education: Stress and Anxiety: - Provides group verbal and written instruction about the health risks of elevated stress and causes of high stress.  Discuss the correlation between heart/lung disease and anxiety and treatment options. Review healthy ways to manage with stress and anxiety.    Initial Review & Psychosocial Screening: Initial Psych Review & Screening - 03/07/19 1041      Initial Review    Current issues with  Current Stress Concerns    Source of Stress Concerns  Family;Chronic Illness      Family Dynamics   Good Support System?  Yes      Barriers   Psychosocial barriers to participate in program  The patient should benefit from training in stress management and relaxation.;There are no identifiable barriers or psychosocial needs.      Screening Interventions   Interventions  Encouraged to exercise;To provide support and resources with identified psychosocial needs;Provide feedback about the scores to participant    Expected Outcomes  Short Term goal: Utilizing psychosocial counselor, staff and physician to assist with identification of specific Stressors or current issues interfering with healing process. Setting desired goal for each stressor or current issue identified.;Long Term Goal: Stressors or current issues are controlled or eliminated.;Short Term goal: Identification and review with participant of any Quality of Life or Depression concerns found by scoring the questionnaire.;Long Term goal: The participant improves quality of Life and PHQ9 Scores as seen by post scores and/or verbalization of changes       Quality of Life Scores:  Quality of Life - 03/14/19 1411      Quality of Life   Select  Quality of Life      Quality of Life Scores   Health/Function Pre  28.8 %    Socioeconomic Pre  28.07 %    Psych/Spiritual Pre  30 %    Family Pre  30 %    GLOBAL Pre  29.07 %      Scores of 19 and below usually indicate a poorer quality of life in these areas.  A difference of  2-3 points is a clinically meaningful difference.  A difference of 2-3  points in the total score of the Quality of Life Index has been associated with significant improvement in overall quality of life, self-image, physical symptoms, and general health in studies assessing change in quality of life.  PHQ-9: Recent Review Flowsheet Data    Depression screen [facility] 2/9 03/14/2019 01/25/2016 10/10/2015    Decreased Interest 0 0 0   Down, Depressed, Hopeless 0 0 0   PHQ - 2 Score 0 0 0   Altered sleeping 0 - -   Tired, decreased energy 0 - -   Change in appetite 0 - -   Feeling bad or failure about yourself  0 - -   Trouble concentrating 0 - -   Moving slowly or fidgety/restless 0 - -   Suicidal thoughts 0 - -   PHQ-9 Score 0 - -   Difficult doing work/chores Not difficult at all - -     Interpretation of Total Score  Total Score Depression Severity:  1-4 = Minimal depression, 5-9 = Mild depression, 10-14 = Moderate depression, 15-19 = Moderately severe depression, 20-27 = Severe depression   Psychosocial Evaluation and Intervention: Psychosocial Evaluation - 03/07/19 1049      Psychosocial Evaluation & Interventions   Comments  Sherie recently lost her sister back in December. She did not mention it, but when asked how her stress levels were she stated she just has a lot going on. She has a good support system. She is trying to quit smoking on her own and wants to feel better. She was proud of herself for increasing her activity throughout the day on her own and is excited to start Cardiac Rehab    Expected Outcomes  Short: attend Cardiac Rehab for exercise and education. Long: develop positive self care habits.    Continue Psychosocial Services   Follow up required by staff       Psychosocial Re-Evaluation:   Psychosocial Discharge (Final Psychosocial Re-Evaluation):   Vocational Rehabilitation: Provide vocational rehab assistance to qualifying candidates.   Vocational Rehab Evaluation & Intervention: Vocational Rehab - 03/07/19 1049      Initial Vocational Rehab Evaluation & Intervention   Assessment shows need for Vocational Rehabilitation  No       Education: Education Goals: Education classes will be provided on a variety of topics geared toward better understanding of heart health and risk factor modification. Participant will state understanding/return demonstration  of topics presented as noted by education test scores.  Learning Barriers/Preferences: Learning Barriers/Preferences - 03/07/19 1049      Learning Barriers/Preferences   Learning Barriers  None    Learning Preferences  None       General Cardiac Education Topics:  AED/CPR: - Group verbal and written instruction with the use of models to demonstrate the basic use of the AED with the basic ABC's of resuscitation.   Anatomy & Physiology of the Heart: - Group verbal and written instruction and models provide basic cardiac anatomy and physiology, with the coronary electrical and arterial systems. Review of Valvular disease and Heart Failure   Cardiac Procedures: - Group verbal and written instruction to review commonly prescribed medications for heart disease. Reviews the medication, class of the drug, and side effects. Includes the steps to properly store meds and maintain the prescription regimen. (beta blockers and nitrates)   Cardiac Medications I: - Group verbal and written instruction to review commonly prescribed medications for heart disease. Reviews the medication, class of the drug, and side effects. Includes the steps to properly  store meds and maintain the prescription regimen.   Cardiac Medications II: -Group verbal and written instruction to review commonly prescribed medications for heart disease. Reviews the medication, class of the drug, and side effects. (all other drug classes)    Go Sex-Intimacy & Heart Disease, Get SMART - Goal Setting: - Group verbal and written instruction through game format to discuss heart disease and the return to sexual intimacy. Provides group verbal and written material to discuss and apply goal setting through the application of the S.M.A.R.T. Method.   Other Matters of the Heart: - Provides group verbal, written materials and models to describe Stable Angina and Peripheral Artery. Includes description of the disease process and  treatment options available to the cardiac patient.   Infection Prevention: - Provides verbal and written material to individual with discussion of infection control including proper hand washing and proper equipment cleaning during exercise session.   Cardiac Rehab from 03/14/2019 in [facility] Cardiac and Pulmonary Rehab  Date  03/14/19  Educator  [facility]  Instruction Review Code  1- Verbalizes Understanding      Falls Prevention: - Provides verbal and written material to individual with discussion of falls prevention and safety.   Cardiac Rehab from 03/14/2019 in [facility] Cardiac [facility] Pulmonary Rehab  Date  03/14/19  Educator  [facility]  Instruction Review Code  1- Verbalizes Understanding      Other: -Provides group and verbal instruction on various topics (see comments)   Knowledge Questionnaire Score: Knowledge Questionnaire Score - 03/14/19 1411      Knowledge Questionnaire Score   Pre Score  21/26 Education Focus: A&P, Depression, Angina, PAD, Exercise       Core Components/Risk Factors/Patient Goals at Admission: Personal Goals and Risk Factors at Admission - 03/14/19 1412      Core Components/Risk Factors/Patient Goals on Admission    Weight Management  Yes;Weight Maintenance    Intervention  Weight Management: Develop a combined nutrition and exercise program designed to reach desired caloric intake, while maintaining appropriate intake of nutrient and fiber, sodium and fats, and appropriate energy expenditure required for the weight goal.;Weight Management: Provide education and appropriate resources to help participant work on and attain dietary goals.    Admit Weight  130 lb 6.4 oz (59.1 kg)    Goal Weight: Short Term  130 lb 6.4 oz (59.1 kg)    Goal Weight: Long Term  130 lb (59 kg)    Expected Outcomes  Short Term: Continue to assess and modify interventions until short term weight is achieved;Long Term: Adherence to nutrition and physical activity/exercise program aimed toward  attainment of established weight goal;Weight Maintenance: Understanding of the daily nutrition guidelines, which includes 25-35% calories from fat, 7% or less cal from saturated fats, less than 262m cholesterol, less than 1.5gm of sodium, & 5 or more servings of fruits and vegetables daily    Tobacco Cessation  Yes    Number of packs per day  3 cigs    Intervention  Assist the participant in steps to quit. Provide individualized education and counseling about committing to Tobacco Cessation, relapse prevention, and pharmacological support that can be provided by physician.;OAdvice worker assist with locating and accessing local/national Quit Smoking programs, and support quit date choice.    Expected Outcomes  Short Term: Will quit all tobacco product use, adhering to prevention of relapse plan.;Short Term: Will demonstrate readiness to quit, by selecting a quit date.;Long Term: Complete abstinence from all tobacco products for at least 12 months  from quit date.    Hypertension  Yes    Intervention  Provide education on lifestyle modifcations including regular physical activity/exercise, weight management, moderate sodium restriction and increased consumption of fresh fruit, vegetables, and low fat dairy, alcohol moderation, and smoking cessation.;Monitor prescription use compliance.    Expected Outcomes  Short Term: Continued assessment and intervention until BP is < 140/74m HG in hypertensive participants. < 130/849mHG in hypertensive participants with diabetes, heart failure or chronic kidney disease.;Long Term: Maintenance of blood pressure at goal levels.    Lipids  Yes    Intervention  Provide education and support for participant on nutrition & aerobic/resistive exercise along with prescribed medications to achieve LDL <7066mHDL >64m54m  Expected Outcomes  Short Term: Participant states understanding of desired cholesterol values and is compliant with medications prescribed.  Participant is following exercise prescription and nutrition guidelines.;Long Term: Cholesterol controlled with medications as prescribed, with individualized exercise RX and with personalized nutrition plan. Value goals: LDL < 70mg37mL > 40 mg.       Education:Diabetes - Individual verbal and written instruction to review signs/symptoms of diabetes, desired ranges of glucose level fasting, after meals and with exercise. Acknowledge that pre and post exercise glucose checks will be done for 3 sessions at entry of program.   Education: Know Your Numbers and Risk Factors: -Group verbal and written instruction about important numbers in your health.  Discussion of what are risk factors and how they play a role in the disease process.  Review of Cholesterol, Blood Pressure, Diabetes, and BMI and the role they play in your overall health.   Core Components/Risk Factors/Patient Goals Review:    Core Components/Risk Factors/Patient Goals at Discharge (Final Review):    ITP Comments: ITP Comments    Row Name 03/07/19 1046 03/14/19 1406 03/23/19 0730 03/30/19 0621 04/26/19 1603   ITP Comments  Initial phone orientation completed. Diagnosis can be found in [facility] EP Orientation scheduled for 2/8  Completed 6MWT and gym orientation.  Initial ITP created and sent for review to Dr. Mark Emily Filbertical Director.  Completed Initial RD Eval  30 day chart review completed. ITP sent to Dr M MilZachery Dakinscal Director, for review,changes as needed and signature.  Johnnie Sunnien hold for two weeks, per MD order.  Follow up scheduled for 3/26 with Dr. End. [place] 04/27/19 0550 05/02/19 0926 05/10/19 1532 05/23/19 1644 05/25/19 0527   ITP Comments  30 day chart review completed. ITP sent to Dr M MilZachery Dakinscal Director, for review,changes as needed and signature. Continue with ITP if no changes requested  Malyah's MD appointment with Dr End was moved from the 3/26 to 4/8. She was not going to return  to rehab until after her MD appointment.  Juanice Zilphia Dr End on 4/8 and will get clearance to return to exercise..  Called out Wednesday with an upper respiratory infection.  Out since 04/11/19.  30 Day review completed. Medical Director review done, changes made as directed,and approval shown by signature of [employer] researcher  Comments:

## 2019-05-31 DIAGNOSIS — J439 Emphysema, unspecified: Secondary | ICD-10-CM | POA: Diagnosis not present

## 2019-05-31 DIAGNOSIS — R05 Cough: Secondary | ICD-10-CM | POA: Diagnosis not present

## 2019-05-31 DIAGNOSIS — F17200 Nicotine dependence, unspecified, uncomplicated: Secondary | ICD-10-CM | POA: Diagnosis not present

## 2019-05-31 DIAGNOSIS — Z1389 Encounter for screening for other disorder: Secondary | ICD-10-CM | POA: Diagnosis not present

## 2019-05-31 DIAGNOSIS — J449 Chronic obstructive pulmonary disease, unspecified: Secondary | ICD-10-CM | POA: Diagnosis not present

## 2019-06-02 ENCOUNTER — Telehealth: Payer: Self-pay | Admitting: *Deleted

## 2019-06-02 ENCOUNTER — Encounter: Payer: Self-pay | Admitting: *Deleted

## 2019-06-02 DIAGNOSIS — Z955 Presence of coronary angioplasty implant and graft: Secondary | ICD-10-CM

## 2019-06-02 NOTE — Progress Notes (Signed)
Discharge Progress Report  Patient Details  Name: Kimberly Kemp MRN: [id number] Date of Birth: 24-Feb-1965 Referring Provider:     Cardiac Rehab from 03/14/2019 in [facility] Cardiac and Pulmonary Rehab  Referring Provider  End, Harrell Gave MD       Number of Visits: 5  Reason for Discharge:  Early Exit:  Personal and Illness  Smoking History:  Social History   Tobacco Use  Smoking Status Current Every Day Smoker  . Packs/day: 0.20  . Years: 43.00  . Pack years: 8.60  . Types: Cigarettes  Smokeless Tobacco Never Used  Tobacco Comment   cutting back    Diagnosis:  Status post coronary artery stent placement  ADL UCSD:   Initial Exercise Prescription: Initial Exercise Prescription - 03/14/19 1400      Date of Initial Exercise RX and Referring Provider   Date  03/14/19    Referring Provider  End, Harrell Gave MD      Treadmill   MPH  2.2    Grade  1    Minutes  15    METs  2.99      Recumbant Bike   Level  3    RPM  50    Watts  38    Minutes  15    METs  3      NuStep   Level  3    SPM  80    Minutes  15    METs  3      Elliptical   Level  1    Speed  3    Minutes  15    METs  3      T5 Nustep   Level  3    SPM  80    Minutes  15    METs  3      Prescription Details   Frequency (times per week)  3    Duration  Progress to 30 minutes of continuous aerobic without signs/symptoms of physical distress      Intensity   THRR 40-80% of Max Heartrate  117-150    Ratings of Perceived Exertion  11-13    Perceived Dyspnea  0-4      Progression   Progression  Continue to progress workloads to maintain intensity without signs/symptoms of physical distress.      Resistance Training   Training Prescription  Yes    Weight  3 lb    Reps  10-15       Discharge Exercise Prescription (Final Exercise Prescription Changes): Exercise Prescription Changes - 04/12/19 1300      Response to Exercise   Blood Pressure (Admit)  132/66    Blood Pressure  (Exercise)  142/56    Blood Pressure (Exit)  102/54    Heart Rate (Admit)  86 bpm    Heart Rate (Exercise)  136 bpm    Heart Rate (Exit)  91 bpm    Rating of Perceived Exertion (Exercise)  17    Symptoms  none    Comments  second day exercise      Progression   Progression  Continue to progress workloads to maintain intensity without signs/symptoms of physical distress.    Average METs  2.99      Resistance Training   Training Prescription  Yes    Weight  3 lb    Reps  10-15      Interval Training   Interval Training  No      Treadmill  MPH  2.2    Grade  1    Minutes  15    METs  2.99      Elliptical   Level  1    Speed  3    Minutes  15       Functional Capacity: 6 Minute Walk    Row Name 03/14/19 1407         6 Minute Walk   Phase  Initial     Distance  1300 feet     Walk Time  6 minutes     # of Rest Breaks  0     MPH  2.46     METS  4.06     RPE  12     VO2 Peak  14.22     Symptoms  No     Resting HR  95 bpm     Resting BP  126/70     Resting Oxygen Saturation   97 %     Exercise Oxygen Saturation  during 6 min walk  99 %     Max Ex. HR  95 bpm     Max Ex. BP  146/74     2 Minute Post BP  122/56        Psychological, QOL, Others - Outcomes: PHQ 2/9: Depression screen [facility] 2/9 03/14/2019 01/25/2016 10/10/2015  Decreased Interest 0 0 0  Down, Depressed, Hopeless 0 0 0  PHQ - 2 Score 0 0 0  Altered sleeping 0 - -  Tired, decreased energy 0 - -  Change in appetite 0 - -  Feeling bad or failure about yourself  0 - -  Trouble concentrating 0 - -  Moving slowly or fidgety/restless 0 - -  Suicidal thoughts 0 - -  PHQ-9 Score 0 - -  Difficult doing work/chores Not difficult at all - -  Some encounter information is confidential and restricted. Go to Review Flowsheets activity to see all data.  Some recent data might be hidden    Quality of Life: Quality of Life - 03/14/19 1411      Quality of Life   Select  Quality of Life      Quality of  Life Scores   Health/Function Pre  28.8 %    Socioeconomic Pre  28.07 %    Psych/Spiritual Pre  30 %    Family Pre  30 %    GLOBAL Pre  29.07 %       Education Questionnaire Score: Knowledge Questionnaire Score - 03/14/19 1411      Knowledge Questionnaire Score   Pre Score  21/26 Education Focus: A&P, Depression, Angina, PAD, Exercise       Goals reviewed with patient; copy given to patient.

## 2019-06-02 NOTE — Telephone Encounter (Signed)
Called to check on patient.  She is still not feeling better and her infection is starting to interfere with her breathing.  They have given her a stronger a antibiotic and then possible xray and COVID.  She is still very weak and not feeling well.  She would like to discharge at this time and take time to recover.  She would like to return with a new referral when she is able.

## 2019-06-02 NOTE — Progress Notes (Signed)
Cardiac Individual Treatment Plan  Patient Details  Name: Kimberly Kemp MRN: [id number] Date of Birth: 05-Nov-1965 Referring Provider:     Cardiac Rehab from 03/14/2019 in [facility] Cardiac and Pulmonary Rehab  Referring Provider  End, Harrell Gave MD      Initial Encounter Date:    Cardiac Rehab from 03/14/2019 in [facility] Cardiac and Pulmonary Rehab  Date  03/14/19      Visit Diagnosis: Status post coronary artery stent placement  Patient's Home Medications on Admission:  Current Outpatient Medications:  .  albuterol (PROAIR HFA) 108 (90 Base) MCG/ACT inhaler, INHALE 2 PUFFS INTO THE LUNGS EVERY FOUR HOURS AS NEEDED FOR WHEEZING (Patient taking differently: Inhale 2 puffs into the lungs every 4 (four) hours as needed for wheezing or shortness of breath. ), Disp: 8.5 Inhaler, Rfl: 5 .  albuterol (PROVENTIL) (2.5 MG/3ML) 0.083% nebulizer solution, TAKE 3 MLS (2.5 MG TOTAL) BY NEBULIZATION 4 TIMES DAILY AS NEEDED FOR WHEEZING OR SHORTNESS OF BREATH (Patient taking differently: Take 2.5 mg by nebulization 4 (four) times daily. ), Disp: 375 mL, Rfl: 3 .  aspirin EC 81 MG tablet, Take 81 mg by mouth daily., Disp: , Rfl:  .  benzonatate (TESSALON) 200 MG capsule, Take 200 mg by mouth 3 (three) times daily as needed for cough., Disp: , Rfl:  .  budesonide (PULMICORT) 0.25 MG/2ML nebulizer solution, TAKE 2 MLS (0.25 MG TOTAL) BY NEBULIZATION 2 (TWO) TIMES DAILY., Disp: 360 mL, Rfl: 1 .  butalbital-acetaminophen-caffeine (FIORICET, ESGIC) 50-325-40 MG tablet, Take 1 tablet by mouth 3 (three) times daily as needed for headache., Disp: , Rfl: 2 .  famotidine (PEPCID) 40 MG tablet, Take 40 mg by mouth at bedtime., Disp: , Rfl:  .  gabapentin (NEURONTIN) 300 MG capsule, Take 300 mg by mouth at bedtime., Disp: , Rfl: 1 .  ipratropium (ATROVENT) 0.02 % nebulizer solution, Take 0.5 mg by nebulization 4 (four) times daily., Disp: , Rfl:  .  magic mouthwash w/lidocaine SOLN, Take 10 mLs by mouth 4 (four) times daily as  needed for mouth pain. (Patient taking differently: Take 10 mLs by mouth 2 (two) times daily. ), Disp: 120 mL, Rfl: 0 .  metoprolol tartrate (LOPRESSOR) 25 MG tablet, Take 1.5 tablets (37.5 mg total) by mouth 2 (two) times daily., Disp: 270 tablet, Rfl: 1 .  montelukast (SINGULAIR) 10 MG tablet, Take 10 mg by mouth daily., Disp: , Rfl:  .  nitroGLYCERIN (NITROSTAT) 0.4 MG SL tablet, Place 1 tablet (0.4 mg total) under the tongue every 5 (five) minutes as needed for chest pain. Maximum of 3 doses., Disp: 25 tablet, Rfl: 3 .  Omega-3 Fatty Acids (FISH OIL PO), Take 1 capsule by mouth 2 (two) times daily., Disp: , Rfl:  .  omeprazole (PRILOSEC) 40 MG capsule, TAKE 1 CAPSULE BY MOUTH EVERY DAY, Disp: 30 capsule, Rfl: 2 .  PAZEO 0.7 % SOLN, Place 1 drop into both eyes daily., Disp: , Rfl:  .  promethazine (PHENERGAN) 25 MG tablet, Take 25 mg by mouth 4 (four) times daily as needed for nausea or vomiting. , Disp: , Rfl: 2 .  promethazine-codeine (PHENERGAN WITH CODEINE) 6.25-10 MG/5ML syrup, Take 5 mLs by mouth at bedtime as needed for cough., Disp: 240 mL, Rfl: 0 .  rosuvastatin (CRESTOR) 40 MG tablet, Take 1 tablet (40 mg total) by mouth daily., Disp: 90 tablet, Rfl: 3 .  ticagrelor (BRILINTA) 90 MG TABS tablet, Take 1 tablet (90 mg total) by mouth 2 (two) times daily.,  Disp: 60 tablet, Rfl: 6  Past Medical History: Past Medical History:  Diagnosis Date  . ? h/o Paroxysmal atrial fibrillation (HCC)   . ? h/o Pulmonary embolism ([place])   . Anxiety   . Asthma   . Atypical chest pain    a. 09/2018 ETT: poor exercise tolerance; b. 11/2018 Dobutamine Echo: No wall motion abnormalities, EF 55-60%.  Marland Kitchen CAD (coronary artery disease)    a. 02/2019 Cath/PCI: LM nl, LAD nl, LCX nl, RCA 11m(attempted iFR->wire dissection-->3.0x38 Resolute Onyx DES prox, 2.75x38 Resolute Onyx DES mid), RPDA jailed by stent. EF 65%.  . Chronic pain   . Cirrhosis ([place]   . Colon cancer ([place]   . COPD (chronic obstructive  pulmonary disease) ([place]   . Depression   . Emphysema   . GERD (gastroesophageal reflux disease)   . Heart murmur   . Hepatitis B   . History of echocardiogram    a. 02/2019 Echo: EF 55-60%. No rwma. No significant valvular disease.  .Marland KitchenHyperlipidemia   . Hypertension   . Palpitations    a. 02/2019 Zio: Sinus, avg 83 (55-135). Rare PACs/PVCs. Four atrial runs, longest 7 beats, fastest 146 bpm. Triggered event->sinus rhythm & artifact.  . St[place] ([place]   . Tobacco abuse     Tobacco Use: Social History   Tobacco Use  Smoking Status Current Every Day Smoker  . Packs/day: 0.20  . Years: 43.00  . Pack years: 8.60  . Types: Cigarettes  Smokeless Tobacco Never Used  Tobacco Comment   cutting back    Labs: Recent Review Flowsheet Data    Labs for ITP Cardiac and Pulmonary Rehab Latest Ref Rng & Units 09/02/2006 09/04/2006 09/22/2015   Cholestrol 0 - 200 mg/dL - - 305(H)   LDLCALC 0 - 99 mg/dL - - 174(H)   HDL >40 mg/dL - - 83   Trlycerides <150 mg/dL - - 241(H)   PHART 7.350 - 7.450 - 7.453(H) 7.500(H)   PCO2ART 35.0 - 45.0 mmHg - 26.1(L) 25.8(L)   HCO3 20.0 - 24.0 mEq/L 17.0(L) 18.0(L) 20.1   TCO2 0 - 100 mmol/L 18 18.8 21   ACIDBASEDEF 0.0 - 2.0 mmol/L 5.0(H) 5.2(H) 2.0   O2SAT % - 99.2 100.0       Exercise Target Goals: Exercise Program Goal: Individual exercise prescription set using results from initial 6 min walk test and THRR while considering  patient's activity barriers and safety.   Exercise Prescription Goal: Initial exercise prescription builds to 30-45 minutes a day of aerobic activity, 2-3 days per week.  Home exercise guidelines will be given to patient during program as part of exercise prescription that the participant will acknowledge.   Education: Aerobic Exercise & Resistance Training: - Gives group verbal and written instruction on the various components of exercise. Focuses on aerobic and resistive training programs and the benefits of this training and  how to safely progress through these programs..   Education: Exercise & Equipment Safety: - Individual verbal instruction and demonstration of equipment use and safety with use of the equipment.   [facility]  Date  03/14/19  [facility]  [facility]  1- Verbalizes Understanding      Education: Exercise Physiology & General Exercise Guidelines: - Group verbal and written instruction with models to review the exercise physiology of the cardiovascular system and associated critical values. Provides general exercise guidelines with specific guidelines to those with heart or lung  disease.    Education: Flexibility, Balance, Mind/Body Relaxation: Provides group verbal/written instruction on the benefits of flexibility and balance training, including mind/body exercise modes such as yoga, pilates and tai chi.  Demonstration and skill practice provided.   Activity Barriers & Risk Stratification: Activity Barriers & Cardiac Risk Stratification - 03/14/19 1407      Activity Barriers & Cardiac Risk Stratification   Activity Barriers  Arthritis;Joint Problems;Deconditioning;Muscular Weakness;Shortness of Breath    Cardiac Risk Stratification  High       6 Minute Walk: 6 Minute Walk    Row Name 03/14/19 1407         6 Minute Walk   Phase  Initial     Distance  1300 feet     Walk Time  6 minutes     # of Rest Breaks  0     MPH  2.46     METS  4.06     RPE  12     VO2 Peak  14.22     Symptoms  No     Resting HR  95 bpm     Resting BP  126/70     Resting Oxygen Saturation   97 %     Exercise Oxygen Saturation  during 6 min walk  99 %     Max Ex. HR  95 bpm     Max Ex. BP  146/74     2 Minute Post BP  122/56        Oxygen Initial Assessment:   Oxygen Re-Evaluation:   Oxygen Discharge (Final Oxygen Re-Evaluation):   Initial Exercise Prescription: Initial Exercise Prescription - 03/14/19 1400      Date  of Initial Exercise RX and Referring Provider   Date  03/14/19    Referring Provider  End, Harrell Gave MD      Treadmill   MPH  2.2    Grade  1    Minutes  15    METs  2.99      Recumbant Bike   Level  3    RPM  50    Watts  38    Minutes  15    METs  3      NuStep   Level  3    SPM  80    Minutes  15    METs  3      Elliptical   Level  1    Speed  3    Minutes  15    METs  3      T5 Nustep   Level  3    SPM  80    Minutes  15    METs  3      Prescription Details   Frequency (times per week)  3    Duration  Progress to 30 minutes of continuous aerobic without signs/symptoms of physical distress      Intensity   THRR 40-80% of Max Heartrate  117-150    Ratings of Perceived Exertion  11-13    Perceived Dyspnea  0-4      Progression   Progression  Continue to progress workloads to maintain intensity without signs/symptoms of physical distress.      Resistance Training   Training Prescription  Yes    Weight  3 lb    Reps  10-15       Perform Capillary Blood Glucose checks as needed.  Exercise Prescription Changes: Exercise Prescription Changes  [place] Name 03/14/19 1400 04/12/19 1300           Response to Exercise   Blood Pressure (Admit)  126/70  132/66      Blood Pressure (Exercise)  146/74  142/56      Blood Pressure (Exit)  122/56  102/54      Heart Rate (Admit)  95 bpm  86 bpm      Heart Rate (Exercise)  95 bpm  136 bpm      Heart Rate (Exit)  87 bpm  91 bpm      Oxygen Saturation (Admit)  97 %  --      Oxygen Saturation (Exercise)  99 %  --      Rating of Perceived Exertion (Exercise)  12  17      Symptoms  none   none      Comments  walk test results  second day exercise        Progression   Progression  --  Continue to progress workloads to maintain intensity without signs/symptoms of physical distress.      Average METs  --  2.99        Resistance Training   Training Prescription  --  Yes      Weight  --  3 lb      Reps  --  10-15         Interval Training   Interval Training  --  No        Treadmill   MPH  --  2.2      Grade  --  1      Minutes  --  15      METs  --  2.99        Elliptical   Level  --  1      Speed  --  3      Minutes  --  15         Exercise Comments:   Exercise Goals and Review: Exercise Goals    [place] Name 03/14/19 1410             Exercise Goals   Increase Physical Activity  Yes       Intervention  Provide advice, education, support and counseling about physical activity/exercise needs.;Develop an individualized exercise prescription for aerobic and resistive training based on initial evaluation findings, risk stratification, comorbidities and participant's personal goals.       Expected Outcomes  Short Term: Attend rehab on a regular basis to increase amount of physical activity.;Long Term: Add in home exercise to make exercise part of routine and to increase amount of physical activity.;Long Term: Exercising regularly at least 3-5 days a week.       Increase Strength and Stamina  Yes       Intervention  Provide advice, education, support and counseling about physical activity/exercise needs.;Develop an individualized exercise prescription for aerobic and resistive training based on initial evaluation findings, risk stratification, comorbidities and participant's personal goals.       Expected Outcomes  Short Term: Increase workloads from initial exercise prescription for resistance, speed, and METs.;Short Term: Perform resistance training exercises routinely during rehab and add in resistance training at home;Long Term: Improve cardiorespiratory fitness, muscular endurance and strength as measured by increased METs and functional capacity (6MWT)       Able to understand and use rate of perceived exertion (RPE) scale  Yes       Intervention  Provide  education and explanation on how to use RPE scale       Expected Outcomes  Short Term: Able to use RPE daily in rehab to express  subjective intensity level;Long Term:  Able to use RPE to guide intensity level when exercising independently       Able to understand and use Dyspnea scale  Yes       Intervention  Provide education and explanation on how to use Dyspnea scale       Expected Outcomes  Short Term: Able to use Dyspnea scale daily in rehab to express subjective sense of shortness of breath during exertion;Long Term: Able to use Dyspnea scale to guide intensity level when exercising independently       Knowledge and understanding of Target Heart Rate Range (THRR)  Yes       Intervention  Provide education and explanation of THRR including how the numbers were predicted and where they are located for reference       Expected Outcomes  Short Term: Able to state/look up THRR;Short Term: Able to use daily as guideline for intensity in rehab;Long Term: Able to use THRR to govern intensity when exercising independently       Able to check pulse independently  Yes       Intervention  Provide education and demonstration on how to check pulse in carotid and radial arteries.;Review the importance of being able to check your own pulse for safety during independent exercise       Expected Outcomes  Short Term: Able to explain why pulse checking is important during independent exercise;Long Term: Able to check pulse independently and accurately       Understanding of Exercise Prescription  Yes       Intervention  Provide education, explanation, and written materials on patient's individual exercise prescription       Expected Outcomes  Short Term: Able to explain program exercise prescription;Long Term: Able to explain home exercise prescription to exercise independently          Exercise Goals Re-Evaluation : Exercise Goals Re-Evaluation    Row Name 03/30/19 1203 04/12/19 1352 04/26/19 1604 05/23/19 1646       Exercise Goal Re-Evaluation   Exercise Goals Review  --  Increase Physical Activity;Increase Strength and Stamina;Able  to understand and use rate of perceived exertion (RPE) scale;Able to understand and use Dyspnea scale;Knowledge and understanding of Target Heart Rate Range (THRR);Able to check pulse independently;Understanding of Exercise Prescription  --  --    Comments  Pt has not attended first full day of rehab yet, been out sick.  See note from yesterday 3/8  Out per MD order until after 3/26  Out since last review.    Expected Outcomes  --  Short - see Dr about chest pain Long:  get back to exercise  --  --       Discharge Exercise Prescription (Final Exercise Prescription Changes): Exercise Prescription Changes - 04/12/19 1300      Response to Exercise   Blood Pressure (Admit)  132/66    Blood Pressure (Exercise)  142/56    Blood Pressure (Exit)  102/54    Heart Rate (Admit)  86 bpm    Heart Rate (Exercise)  136 bpm    Heart Rate (Exit)  91 bpm    Rating of Perceived Exertion (Exercise)  17    Symptoms  none    Comments  second day exercise      Progression  Progression  Continue to progress workloads to maintain intensity without signs/symptoms of physical distress.    Average METs  2.99      Resistance Training   Training Prescription  Yes    Weight  3 lb    Reps  10-15      Interval Training   Interval Training  No      Treadmill   MPH  2.2    Grade  1    Minutes  15    METs  2.99      Elliptical   Level  1    Speed  3    Minutes  15       Nutrition:  Target Goals: Understanding of nutrition guidelines, daily intake of sodium <1537m, cholesterol <2053m calories 30% from fat and 7% or less from saturated fats, daily to have 5 or more servings of fruits and vegetables.  Education: Controlling Sodium/Reading Food Labels -Group verbal and written material supporting the discussion of sodium use in heart healthy nutrition. Review and explanation with models, verbal and written materials for utilization of the food label.   Education: General Nutrition Guidelines/Fats and  Fiber: -Group instruction provided by verbal, written material, models and posters to present the general guidelines for heart healthy nutrition. Gives an explanation and review of dietary fats and fiber.   Biometrics: Pre Biometrics - 03/14/19 1410      Pre Biometrics   Height  5' 3.5" (1.613 m)    Weight  130 lb 6.4 oz (59.1 kg)    BMI (Calculated)  22.73    Single Leg Stand  25.35 seconds        Nutrition Therapy Plan and Nutrition Goals: Nutrition Therapy & Goals - 03/23/19 0730      Nutrition Therapy   Diet  HH, Low Na    Protein (specify units)  65-70g    Fiber  25 grams    Whole Grain Foods  3 servings    Saturated Fats  12 max. grams    Fruits and Vegetables  5 servings/day    Sodium  1.5 grams      Personal Nutrition Goals   Nutrition Goal  ST: Mechanical eating - 3 small meals using MyPlate structure with fat LT: RD goal (eat consistent meals) - Unable to fully discuss goals with pt as pt not feeling well    Comments  Pt reports eating chicken or salmon patties and lots of vegetables. Unable to get full diet recall. Pt was not feeling well, but wanted to continue with session. Pt reports eating this way for a while and not knowing what to eat, pt also reports no longer feeling hunger and this was before heart event. Suspect long term low calorie intake, pt at risk for refeeding syndrome - will introduce food slowly; starting with 3 small meals and moving to 6 small meals using mechanical eating. Pt reports no recent weight loss, but said she will know more when she goes to the doctor today. Discussed HH eating, gave examples of each food group and some meal/snack examples, adequate intake, hunger and mechanical eating, and balanced eating without stress. Pt open to changes. Will follow up.      Intervention Plan   Intervention  Nutrition handout(s) given to patient.;Prescribe, educate and counsel regarding individualized specific dietary modifications aiming towards  targeted core components such as weight, hypertension, lipid management, diabetes, heart failure and other comorbidities.    Expected Outcomes  Short Term Goal: Understand basic  principles of dietary content, such as calories, fat, sodium, cholesterol and nutrients.;Short Term Goal: A plan has been developed with personal nutrition goals set during dietitian appointment.;Long Term Goal: Adherence to prescribed nutrition plan.       Nutrition Assessments: Nutrition Assessments - 03/14/19 1411      MEDFICTS Scores   Pre Score  3       MEDIFICTS Score Key:          ?70 Need to make dietary changes          40-70 Heart Healthy Diet         ? 40 Therapeutic Level Cholesterol Diet  Nutrition Goals Re-Evaluation:   Nutrition Goals Discharge (Final Nutrition Goals Re-Evaluation):   Psychosocial: Target Goals: Acknowledge presence or absence of significant depression and/or stress, maximize coping skills, provide positive support system. Participant is able to verbalize types and ability to use techniques and skills needed for reducing stress and depression.   Education: Depression - Provides group verbal and written instruction on the correlation between heart/lung disease and depressed mood, treatment options, and the stigmas associated with seeking treatment.   Education: Sleep Hygiene -Provides group verbal and written instruction about how sleep can affect your health.  Define sleep hygiene, discuss sleep cycles and impact of sleep habits. Review good sleep hygiene tips.     Education: Stress and Anxiety: - Provides group verbal and written instruction about the health risks of elevated stress and causes of high stress.  Discuss the correlation between heart/lung disease and anxiety and treatment options. Review healthy ways to manage with stress and anxiety.    Initial Review & Psychosocial Screening: Initial Psych Review & Screening - 03/07/19 1041      Initial Review    Current issues with  Current Stress Concerns    Source of Stress Concerns  Family;Chronic Illness      Family Dynamics   Good Support System?  Yes      Barriers   Psychosocial barriers to participate in program  The patient should benefit from training in stress management and relaxation.;There are no identifiable barriers or psychosocial needs.      Screening Interventions   Interventions  Encouraged to exercise;To provide support and resources with identified psychosocial needs;Provide feedback about the scores to participant    Expected Outcomes  Short Term goal: Utilizing psychosocial counselor, staff and physician to assist with identification of specific Stressors or current issues interfering with healing process. Setting desired goal for each stressor or current issue identified.;Long Term Goal: Stressors or current issues are controlled or eliminated.;Short Term goal: Identification and review with participant of any Quality of Life or Depression concerns found by scoring the questionnaire.;Long Term goal: The participant improves quality of Life and PHQ9 Scores as seen by post scores and/or verbalization of changes       Quality of Life Scores:  Quality of Life - 03/14/19 1411      Quality of Life   Select  Quality of Life      Quality of Life Scores   Health/Function Pre  28.8 %    Socioeconomic Pre  28.07 %    Psych/Spiritual Pre  30 %    Family Pre  30 %    GLOBAL Pre  29.07 %      Scores of 19 and below usually indicate a poorer quality of life in these areas.  A difference of  2-3 points is a clinically meaningful difference.  A difference of 2-3  points in the total score of the Quality of Life Index has been associated with significant improvement in overall quality of life, self-image, physical symptoms, and general health in studies assessing change in quality of life.  PHQ-9: Recent Review Flowsheet Data    Depression screen [facility] 2/9 03/14/2019 01/25/2016 10/10/2015    Decreased Interest 0 0 0   Down, Depressed, Hopeless 0 0 0   PHQ - 2 Score 0 0 0   Altered sleeping 0 - -   Tired, decreased energy 0 - -   Change in appetite 0 - -   Feeling bad or failure about yourself  0 - -   Trouble concentrating 0 - -   Moving slowly or fidgety/restless 0 - -   Suicidal thoughts 0 - -   PHQ-9 Score 0 - -   Difficult doing work/chores Not difficult at all - -     Interpretation of Total Score  Total Score Depression Severity:  1-4 = Minimal depression, 5-9 = Mild depression, 10-14 = Moderate depression, 15-19 = Moderately severe depression, 20-27 = Severe depression   Psychosocial Evaluation and Intervention: Psychosocial Evaluation - 03/07/19 1049      Psychosocial Evaluation & Interventions   Comments  Diannia recently lost her sister back in December. She did not mention it, but when asked how her stress levels were she stated she just has a lot going on. She has a good support system. She is trying to quit smoking on her own and wants to feel better. She was proud of herself for increasing her activity throughout the day on her own and is excited to start Cardiac Rehab    Expected Outcomes  Short: attend Cardiac Rehab for exercise and education. Long: develop positive self care habits.    Continue Psychosocial Services   Follow up required by staff       Psychosocial Re-Evaluation:   Psychosocial Discharge (Final Psychosocial Re-Evaluation):   Vocational Rehabilitation: Provide vocational rehab assistance to qualifying candidates.   Vocational Rehab Evaluation & Intervention: Vocational Rehab - 03/07/19 1049      Initial Vocational Rehab Evaluation & Intervention   Assessment shows need for Vocational Rehabilitation  No       Education: Education Goals: Education classes will be provided on a variety of topics geared toward better understanding of heart health and risk factor modification. Participant will state understanding/return demonstration  of topics presented as noted by education test scores.  Learning Barriers/Preferences: Learning Barriers/Preferences - 03/07/19 1049      Learning Barriers/Preferences   Learning Barriers  None    Learning Preferences  None       General Cardiac Education Topics:  AED/CPR: - Group verbal and written instruction with the use of models to demonstrate the basic use of the AED with the basic ABC's of resuscitation.   Anatomy & Physiology of the Heart: - Group verbal and written instruction and models provide basic cardiac anatomy and physiology, with the coronary electrical and arterial systems. Review of Valvular disease and Heart Failure   Cardiac Procedures: - Group verbal and written instruction to review commonly prescribed medications for heart disease. Reviews the medication, class of the drug, and side effects. Includes the steps to properly store meds and maintain the prescription regimen. (beta blockers and nitrates)   Cardiac Medications I: - Group verbal and written instruction to review commonly prescribed medications for heart disease. Reviews the medication, class of the drug, and side effects. Includes the steps to properly  store meds and maintain the prescription regimen.   Cardiac Medications II: -Group verbal and written instruction to review commonly prescribed medications for heart disease. Reviews the medication, class of the drug, and side effects. (all other drug classes)    Go Sex-Intimacy & Heart Disease, Get SMART - Goal Setting: - Group verbal and written instruction through game format to discuss heart disease and the return to sexual intimacy. Provides group verbal and written material to discuss and apply goal setting through the application of the S.M.A.R.T. Method.   Other Matters of the Heart: - Provides group verbal, written materials and models to describe Stable Angina and Peripheral Artery. Includes description of the disease process and  treatment options available to the cardiac patient.   Infection Prevention: - Provides verbal and written material to individual with discussion of infection control including proper hand washing and proper equipment cleaning during exercise session.   Cardiac Rehab from 03/14/2019 in [facility] Cardiac and Pulmonary Rehab  Date  03/14/19  Educator  [facility]  Instruction Review Code  1- Verbalizes Understanding      Falls Prevention: - Provides verbal and written material to individual with discussion of falls prevention and safety.   Cardiac Rehab from 03/14/2019 in [facility] Cardiac and Pulmonary Rehab  Date  03/14/19  Educator  [facility]  Instruction Review Code  1- Verbalizes Understanding      Other: -Provides group and verbal instruction on various topics (see comments)   Knowledge Questionnaire Score: Knowledge Questionnaire Score - 03/14/19 1411      Knowledge Questionnaire Score   Pre Score  21/26 Education Focus: A&P, Depression, Angina, PAD, Exercise       Core Components/Risk Factors/Patient Goals at Admission: Personal Goals and Risk Factors at Admission - 03/14/19 1412      Core Components/Risk Factors/Patient Goals on Admission    Weight Management  Yes;Weight Maintenance    Intervention  Weight Management: Develop a combined nutrition and exercise program designed to reach desired caloric intake, while maintaining appropriate intake of nutrient and fiber, sodium and fats, and appropriate energy expenditure required for the weight goal.;Weight Management: Provide education and appropriate resources to help participant work on and attain dietary goals.    Admit Weight  130 lb 6.4 oz (59.1 kg)    Goal Weight: Short Term  130 lb 6.4 oz (59.1 kg)    Goal Weight: Long Term  130 lb (59 kg)    Expected Outcomes  Short Term: Continue to assess and modify interventions until short term weight is achieved;Long Term: Adherence to nutrition and physical activity/exercise program aimed toward  attainment of established weight goal;Weight Maintenance: Understanding of the daily nutrition guidelines, which includes 25-35% calories from fat, 7% or less cal from saturated fats, less than 269m cholesterol, less than 1.5gm of sodium, & 5 or more servings of fruits and vegetables daily    Tobacco Cessation  Yes    Number of packs per day  3 cigs    Intervention  Assist the participant in steps to quit. Provide individualized education and counseling about committing to Tobacco Cessation, relapse prevention, and pharmacological support that can be provided by physician.;OAdvice worker assist with locating and accessing local/national Quit Smoking programs, and support quit date choice.    Expected Outcomes  Short Term: Will quit all tobacco product use, adhering to prevention of relapse plan.;Short Term: Will demonstrate readiness to quit, by selecting a quit date.;Long Term: Complete abstinence from all tobacco products for at least 12 months  from quit date.    Hypertension  Yes    Intervention  Provide education on lifestyle modifcations including regular physical activity/exercise, weight management, moderate sodium restriction and increased consumption of fresh fruit, vegetables, and low fat dairy, alcohol moderation, and smoking cessation.;Monitor prescription use compliance.    Expected Outcomes  Short Term: Continued assessment and intervention until BP is < 140/79m HG in hypertensive participants. < 130/865mHG in hypertensive participants with diabetes, heart failure or chronic kidney disease.;Long Term: Maintenance of blood pressure at goal levels.    Lipids  Yes    Intervention  Provide education and support for participant on nutrition & aerobic/resistive exercise along with prescribed medications to achieve LDL <7030mHDL >42m51m  Expected Outcomes  Short Term: Participant states understanding of desired cholesterol values and is compliant with medications prescribed.  Participant is following exercise prescription and nutrition guidelines.;Long Term: Cholesterol controlled with medications as prescribed, with individualized exercise RX and with personalized nutrition plan. Value goals: LDL < 70mg28mL > 40 mg.       Education:Diabetes - Individual verbal and written instruction to review signs/symptoms of diabetes, desired ranges of glucose level fasting, after meals and with exercise. Acknowledge that pre and post exercise glucose checks will be done for 3 sessions at entry of program.   Education: Know Your Numbers and Risk Factors: -Group verbal and written instruction about important numbers in your health.  Discussion of what are risk factors and how they play a role in the disease process.  Review of Cholesterol, Blood Pressure, Diabetes, and BMI and the role they play in your overall health.   Core Components/Risk Factors/Patient Goals Review:    Core Components/Risk Factors/Patient Goals at Discharge (Final Review):    ITP Comments: ITP Comments    Row Name 03/07/19 1046 03/14/19 1406 03/23/19 0730 03/30/19 0621 04/26/19 1603   ITP Comments  Initial phone orientation completed. Diagnosis can be found in [facility]. EP Orientation scheduled for 2/8  Completed 6MWT and gym orientation.  Initial ITP created and sent for review to Dr. Mark Emily Filbertical Director.  Completed Initial RD Eval  30 day chart review completed. ITP sent to Dr M MilZachery Dakinscal Director, for review,changes as needed and signature.  Arminda Dorothien hold for two weeks, per MD order.  Follow up scheduled for 3/26 with Dr. End. Saunders Revelow [place] 04/27/19 0550 05/02/19 0926 05/10/19 1532 05/23/19 1644 05/25/19 0527   ITP Comments  30 day chart review completed. ITP sent to Dr M MilZachery Dakinscal Director, for review,changes as needed and signature. Continue with ITP if no changes requested  Nykayla's MD appointment with Dr End was moved from the 3/26 to 4/8. She was not going to return  to rehab until after her MD appointment.  Zari Ellena Dr End on 4/8 and will get clearance to return to exercise..  Called out Wednesday with an upper respiratory infection.  Out since 04/11/19.  30 Day review completed. Medical Director review done, changes made as directed,and approval shown by signature of [employer] researcherow [place] 06/02/19 1409           ITP Comments  Called to check on patient.  She is still not feeling better and her infection is starting to interfere with her breathing.  They have given her a stronger a antibiotic and then possible xray and COVID.  She is still very weak and not feeling well.  She would like to discharge at this  time and take time to recover.  She would like to return with a new referral when she is able.          Comments: discharge ITP

## 2019-06-07 ENCOUNTER — Ambulatory Visit: Payer: Medicare Other | Admitting: Pulmonary Disease

## 2019-06-22 IMAGING — CR CHEST - 2 VIEW
1 series · 2 of 2 positions shown · non-contrast
Comparison: 05/06/2018

CLINICAL DATA: Follow-up pneumonia.

EXAM:
CHEST - 2 VIEW

[Series 1: dg chest 2 view · 0.14mm/px · 2 of 2 slices shown]
[im 1/2]
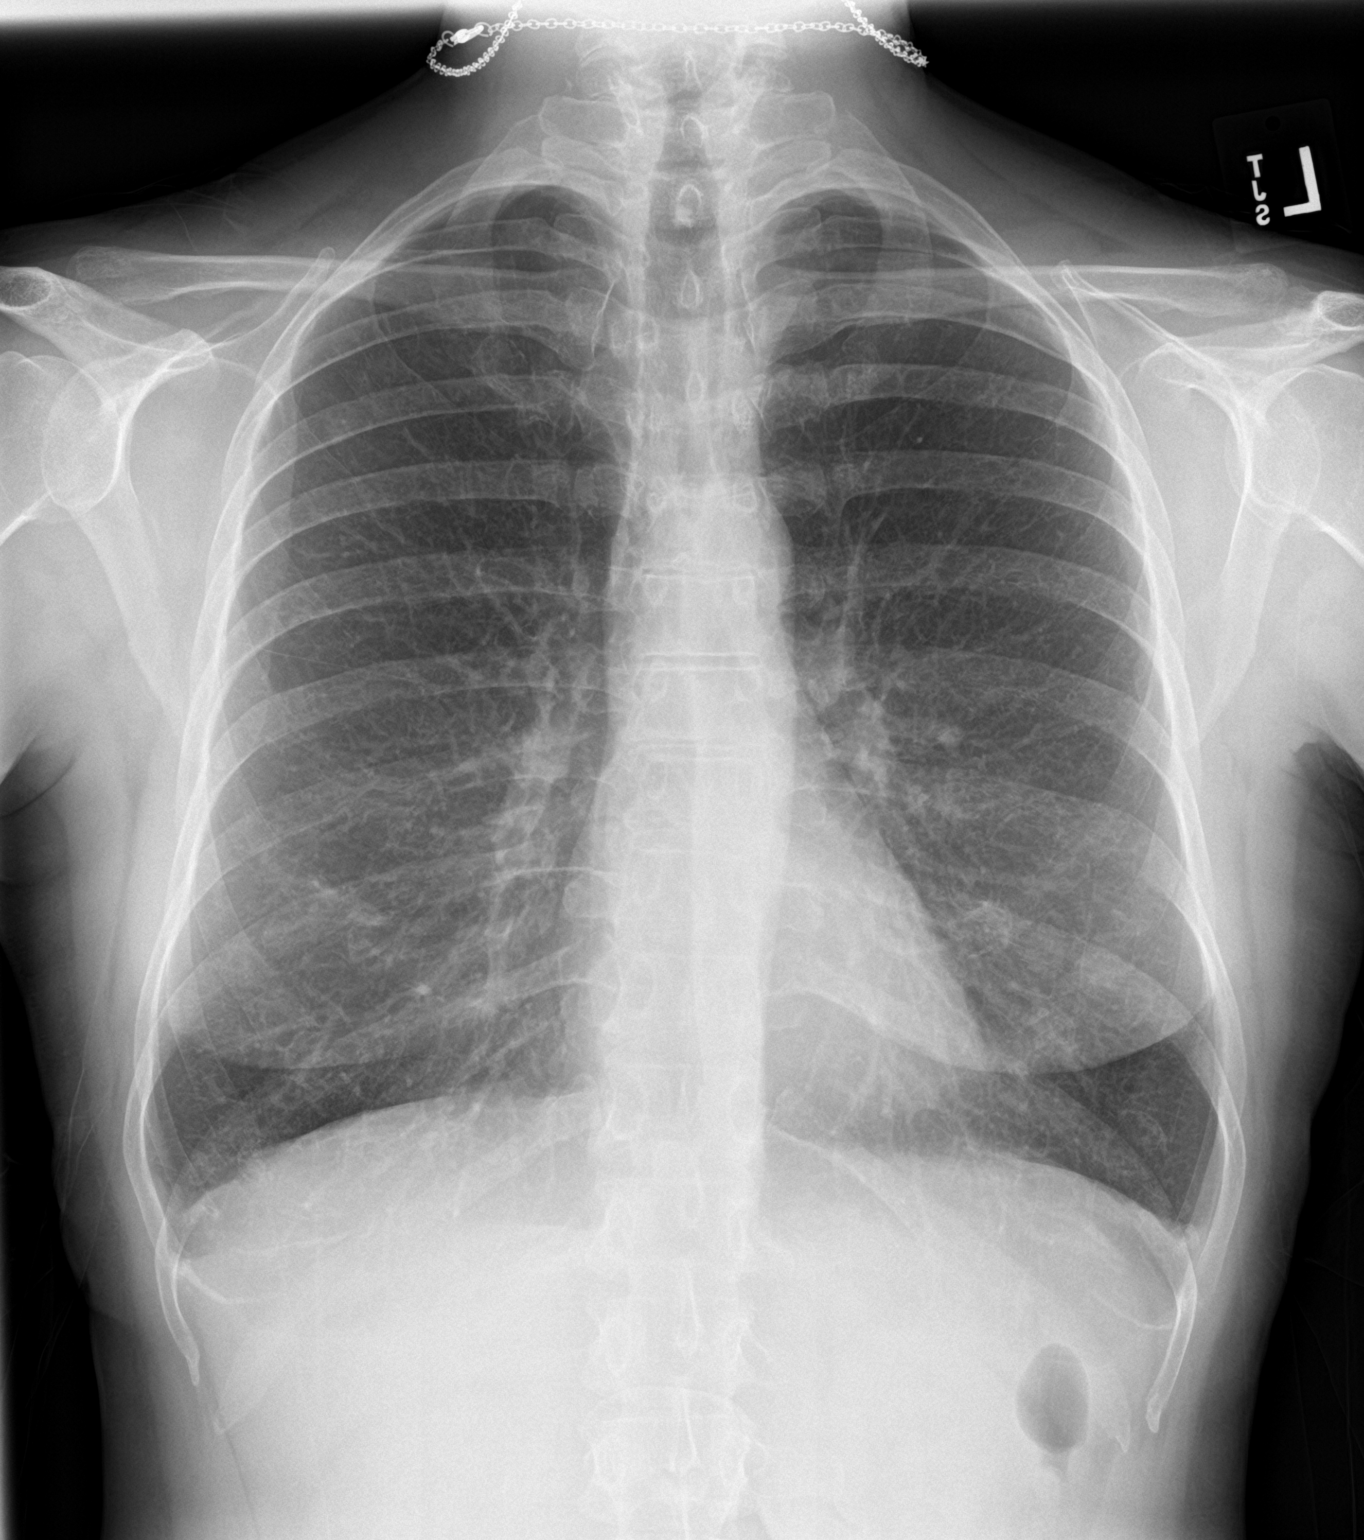
[im 2/2]
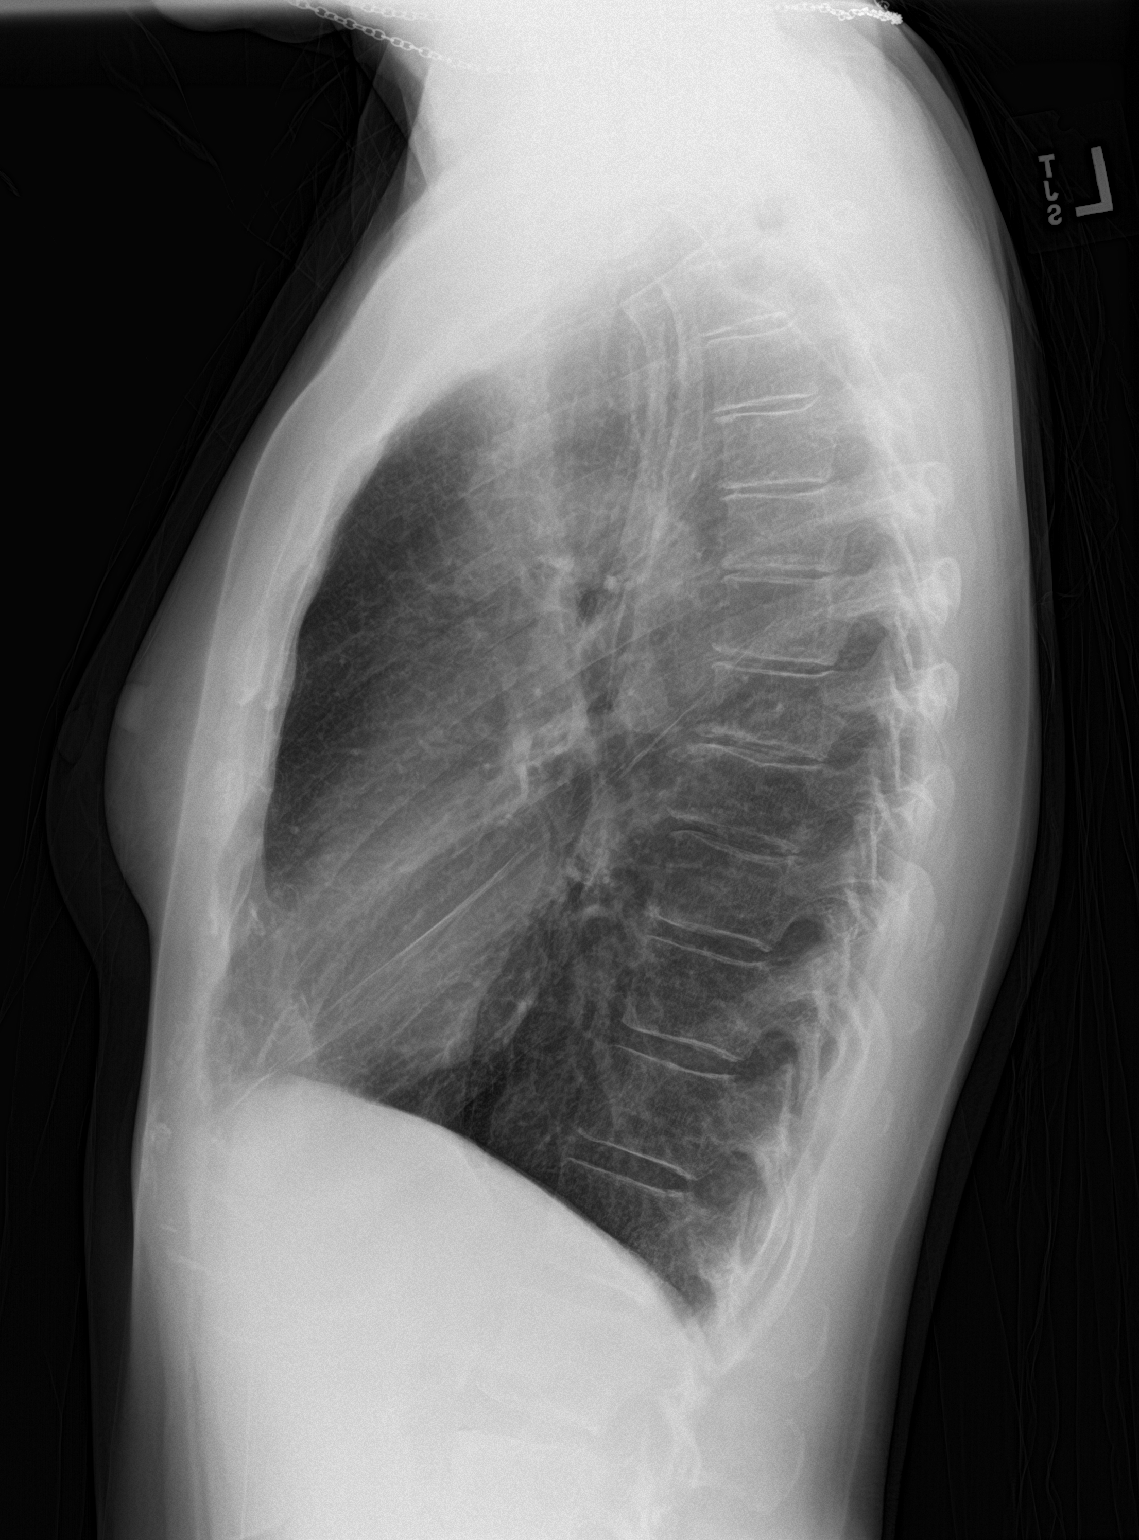

[2 of 2 positions shown; findings below may reference images not displayed]

FINDINGS: The cardiomediastinal silhouette is unremarkable.

RIGHT LOWER lung airspace opacity has resolved.

There is no evidence of focal airspace disease, pulmonary edema,
suspicious pulmonary nodule/mass, pleural effusion, or pneumothorax.

No acute bony abnormalities are identified.
IMPRESSION: No evidence of active cardiopulmonary disease. Resolved RIGHT LOWER
lung pneumonia.

## 2019-07-04 ENCOUNTER — Other Ambulatory Visit: Payer: Self-pay | Admitting: Internal Medicine

## 2019-07-20 ENCOUNTER — Other Ambulatory Visit: Payer: Self-pay | Admitting: Internal Medicine

## 2019-07-25 ENCOUNTER — Telehealth: Payer: Self-pay | Admitting: Internal Medicine

## 2019-07-25 ENCOUNTER — Other Ambulatory Visit: Payer: Self-pay

## 2019-07-25 MED ORDER — METOPROLOL TARTRATE 25 MG PO TABS: ORAL_TABLET | ORAL | 3 refills | Status: DC

## 2019-07-25 NOTE — Telephone Encounter (Signed)
metoprolol tartrate (LOPRESSOR) 25 MG tablet 135 tablet 3 07/25/2019    Sig: Take one and a half tablets by mouth twice a day.   Sent to pharmacy as: metoprolol tartrate (LOPRESSOR) 25 MG tablet   E-Prescribing Status: Receipt confirmed by pharmacy (07/25/2019 10:32 AM EDT)   Pharmacy  [id number] - [place], [place]

## 2019-07-25 NOTE — Telephone Encounter (Signed)
*  STAT* If patient is at the pharmacy, call can be transferred to refill team.   1. Which medications need to be refilled? (please list name of each medication and dose if known) metoprolol tartrate 1.5 tablet twice a day   2. Which pharmacy/location (including street and city if local pharmacy) is medication to be sent to? [facility] [address] in [place]   3. Do they need a 30 day or 90 day supply? 90 day

## 2019-07-31 ENCOUNTER — Telehealth: Payer: Self-pay | Admitting: Internal Medicine

## 2019-07-31 NOTE — Telephone Encounter (Signed)
Cardiology Moonlighter Note  Returned page from patient. She describes 1 week of blurry vision, L sided head numbness, off balance, nausea, and episodic heart racing. Has history of MI s/p PCI. Not currently having any chest pain or pressure or SOB. Has not checked BP or heart rate.   Broad differential diagnosis for the above symptoms. I recommended that the patient come to the ED for a proper evaluation. She agrees. She will go to her closest ED.   Marcie Mowers, MD Cardiology Fellow, PGY-7

## 2019-08-01 NOTE — Telephone Encounter (Signed)
Attempted to call the patient. A family member answered the phone and advised she was not home at the moment.  I left a message with the family member to please have the patient call us back to see how she is doing.  I left our phone number to call back.

## 2019-08-01 NOTE — Telephone Encounter (Signed)
I see no evidence of ED visit.  Please reach out to the patient to see if she continues to have have symptoms.  If so, I suggest that she see her PCP was soon as possible or go to the ED.  [name], MD [facility]

## 2019-08-02 NOTE — Telephone Encounter (Signed)
I agree with plan, as outlined in Ms. McGhee's note.  Kimberly Bush, MD [facility]

## 2019-08-02 NOTE — Telephone Encounter (Signed)
I spoke with the patient. I advised her we were calling at the request of Dr. Saunders Revel to follow up on her symptoms. Per the patient symptoms were as follows: - Wendesday (6/23) she had an episode of nausea that occurred during the day - she took nausea meds and symptoms resolved - Thursday (6/24) she developed blurry vision - symptoms continued through Sunday (6/27) when she called and spoke with the on-call provider - she states after hanging up with the on-call provider, she took a NTG and her heart quit jumping/ racing - she took her PM meds ~ 1 hour early and "things seemed ok" after that  The patient reports complete resolution of symptoms at this time. She states she has been outside quite a bit with her grandchildren and not drinking any fluid.  HR- 87 bpm/ BP - 163/80  I have advised her that symptoms may have been due to dehydration and encouraged her to drink water/ fluids when out in the heat.  She is advised if symptoms re-occur, especially in the setting of her not having been out in the heat, she should be evaluated in the ER.   She is aware I will forward this message to Dr. Saunders Revel as an update and we will call her back with any further questions/ concerns. The patient voices understanding and is agreeable.

## 2019-08-04 ENCOUNTER — Other Ambulatory Visit: Payer: Self-pay | Admitting: Internal Medicine

## 2019-08-15 NOTE — Telephone Encounter (Signed)
This encounter was created in error - please disregard.

## 2019-08-18 ENCOUNTER — Other Ambulatory Visit: Payer: Self-pay

## 2019-08-18 ENCOUNTER — Ambulatory Visit (INDEPENDENT_AMBULATORY_CARE_PROVIDER_SITE_OTHER): Payer: Medicare Other | Admitting: Nurse Practitioner

## 2019-08-18 ENCOUNTER — Encounter: Payer: Self-pay | Admitting: Nurse Practitioner

## 2019-08-18 VITALS — BP 110/74 | HR 74 | Ht 63.0 in | Wt 127.1 lb

## 2019-08-18 DIAGNOSIS — H538 Other visual disturbances: Secondary | ICD-10-CM

## 2019-08-18 DIAGNOSIS — E785 Hyperlipidemia, unspecified: Secondary | ICD-10-CM | POA: Diagnosis not present

## 2019-08-18 DIAGNOSIS — I10 Essential (primary) hypertension: Secondary | ICD-10-CM | POA: Diagnosis not present

## 2019-08-18 DIAGNOSIS — H547 Unspecified visual loss: Secondary | ICD-10-CM

## 2019-08-18 DIAGNOSIS — I471 Supraventricular tachycardia: Secondary | ICD-10-CM

## 2019-08-18 DIAGNOSIS — I25119 Atherosclerotic heart disease of native coronary artery with unspecified angina pectoris: Secondary | ICD-10-CM

## 2019-08-18 NOTE — Progress Notes (Signed)
Office Visit    Patient Name: Kimberly Kemp Date of Encounter: 08/18/2019  Primary Care Provider:  Martin Majestic, FNP Primary Cardiologist:  [name], MD  Chief Complaint    54 year old female with a history of CAD status post PCI to the RCA in January 8257 complicated by iatrogenic dissection of the RCA, ischemic cardiomyopathy, hypertension, familial hyperlipidemia, stroke, questionable paroxysmal atrial fibrillation, questionable pulmonary embolism, COPD, colon cancer, hepatitis B, anxiety, and tobacco abuse, who presents for follow-up of CAD and ischemic cardiomyopathy.  Past Medical History    Past Medical History:  Diagnosis Date  . ? h/o Paroxysmal atrial fibrillation (HCC)   . ? h/o Pulmonary embolism ([place])   . Anxiety   . Asthma   . Atypical chest pain    a. 09/2018 ETT: poor exercise tolerance; b. 11/2018 Dobutamine Echo: No wall motion abnormalities, EF 55-60%.  Marland Kitchen CAD (coronary artery disease)    a. 02/2019 Cath/PCI: LM nl, LAD nl, LCX nl, RCA 82m(attempted iFR->wire dissection-->3.0x38 Resolute Onyx DES prox, 2.75x38 Resolute Onyx DES mid), RPDA jailed by stent. EF 65%.  . Chronic pain   . [place] ([place]   . Colon cancer ([place]   . COPD (chronic obstructive pulmonary disease) ([place]   . Depression   . Emphysema   . GERD (gastroesophageal reflux disease)   . Heart murmur   . Hepatitis B   . History of echocardiogram    a. 02/2019 Echo: EF 55-60%. No rwma. No significant valvular disease.  .Marland KitchenHyperlipidemia   . Hypertension   . Palpitations    a. 02/2019 Zio: Sinus, avg 83 (55-135). Rare PACs/PVCs. Four atrial runs, longest 7 beats, fastest 146 bpm. Triggered event->sinus rhythm & artifact.  . Stroke ([place]   . Tobacco abuse    Past Surgical History:  Procedure Laterality Date  . APPENDECTOMY     microscopic   . BRONCHOSCOPY  March 2008, Feb 2012  . CARDIAC CATHETERIZATION    . CESAREAN SECTION    . CORONARY STENT INTERVENTION N/A 02/22/2019    Procedure: CORONARY STENT INTERVENTION;  Surgeon: ENelva Bush MD;  [facility];  Service: Cardiovascular;  Laterality: N/A;  . INTRAVASCULAR PRESSURE WIRE/FFR STUDY N/A 02/22/2019   Procedure: INTRAVASCULAR PRESSURE WIRE/FFR STUDY;  Surgeon: ENelva Bush MD;  [place];  Service: Cardiovascular;  Laterality: N/A;  . RIGHT/LEFT HEART CATH AND CORONARY ANGIOGRAPHY N/A 02/22/2019   Procedure: RIGHT/LEFT HEART CATH AND CORONARY ANGIOGRAPHY;  Surgeon: ENelva Bush MD;  [place] [facility];  Service: Cardiovascular;  Laterality: N/A;    Allergies  Allergies  Allergen Reactions  . Penicillins Hives    Did it involve swelling of the face/tongue/throat, SOB, or low BP? No Did it involve sudden or severe rash/hives, skin peeling, or any reaction on the inside of your mouth or nose? Yes Did you need to seek medical attention at a hospital or doctor's office? Yes When did it last happen?10 Years If all above answers are "NO", may proceed with cephalosporin use.   . Omnipaque [Iohexol] Itching       . Red Dye Itching    History of Present Illness    54y/o ? with the above complex past medical history including coronary artery disease, ischemic cardiomyopathy, hypertension, familial hyperlipidemia, stroke, questionable history of paroxysmal A. fib, questionable history of PE, COPD, colon cancer, hepatitis B, anxiety, and ongoing tobacco abuse.  In the setting of ongoing chest pain despite normal  ETT in August 2020 and normal dobutamine stress echo in October 2020, she underwent diagnostic catheterization on February 10, 2019, which was notable for moderate proximal RCA disease. iFR was attempted and resulted in dissection and ST segment elevation.  She then underwent drug-eluting stent placement x2.  Echo showed normal LV function with only a small region of akinesis.  She has since been medically managed.  Since her PCI, she has had  intermittent chest pain which is largely sharp and fleeting in nature.  She has not noticed any change in its frequency or severity since her PCI.  She has also had intermittent palpitations with monitoring in January revealing for brief runs of SVT.  In this setting, metoprolol therapy has been titrated and she is currently taking 37.5 mg twice daily and tolerating well.  She has noticed a reduction in palpitations.  Since her last visit, both chest pain and palpitations have been stable and infrequent.  She notes that she is very active with her grandchildren and has not felt this well in many years.  She did not experience chest pain or dyspnea on exertion.  She reports today that she has had 2 episodes of blurring of vision in the past few weeks.  The first occurred 2 to 4 weeks ago and she noted bilateral blurring that lasted for an entire week.  This subsequently resolved spontaneously.  Yesterday, she noted blurring of the vision in her left eye which was more severe than what had occurred 2 to 4 weeks ago and lasted the entire day.  When she awoke this morning blurring had resolved.  She is clear in saying that she did not have complete vision loss on either occasion.  There were no associated symptoms.  She denies weakness, numbness, PND, orthopnea, dizziness, syncope, edema, or early satiety.  Home Medications    Prior to Admission medications   Medication Sig Start Date End Date Taking? Authorizing Provider  albuterol (PROAIR HFA) 108 (90 Base) MCG/ACT inhaler INHALE 2 PUFFS INTO THE LUNGS EVERY FOUR HOURS AS NEEDED FOR WHEEZING Patient taking differently: Inhale 2 puffs into the lungs every 4 (four) hours as needed for wheezing or shortness of breath.  08/07/17  Yes Chesley Mires, MD  albuterol (PROVENTIL) (2.5 MG/3ML) 0.083% nebulizer solution TAKE 3 MLS (2.5 MG TOTAL) BY NEBULIZATION 4 TIMES DAILY AS NEEDED FOR WHEEZING OR SHORTNESS OF BREATH Patient taking differently: Take 2.5 mg by  nebulization 4 (four) times daily.  01/07/19  Yes Chesley Mires, MD  amLODipine (NORVASC) 2.5 MG tablet Take 2.5 mg by mouth daily. 05/21/19  Yes [provider]  aspirin EC 81 MG tablet Take 81 mg by mouth daily.   Yes [provider]  benzonatate (TESSALON) 200 MG capsule Take 200 mg by mouth 3 (three) times daily as needed for cough.   Yes [provider]  budesonide (PULMICORT) 0.25 MG/2ML nebulizer solution TAKE 2 MLS (0.25 MG TOTAL) BY NEBULIZATION 2 (TWO) TIMES DAILY. 03/08/19  Yes Chesley Mires, MD  butalbital-acetaminophen-caffeine (FIORICET, ESGIC) 50-325-40 MG tablet Take 1 tablet by mouth 3 (three) times daily as needed for headache. 11/19/17  Yes [provider]  famotidine (PEPCID) 40 MG tablet Take 40 mg by mouth at bedtime.   Yes [provider]  gabapentin (NEURONTIN) 300 MG capsule Take 300 mg by mouth at bedtime. 11/14/17  Yes [provider]  ipratropium (ATROVENT) 0.02 % nebulizer solution Take 0.5 mg by nebulization 4 (four) times daily.   Yes [provider]  magic mouthwash w/lidocaine SOLN Take 10 mLs by mouth 4 (four) times daily as needed for mouth pain. Patient taking differently: Take 10 mLs by mouth 2 (two) times daily.  09/10/18  Yes Triplett, Cari B, FNP  metoprolol tartrate (LOPRESSOR) 25 MG tablet Take one and a half tablets by mouth twice a day. 07/25/19  Yes End, Harrell Gave, MD  montelukast (SINGULAIR) 10 MG tablet Take 10 mg by mouth daily.   Yes [provider]  nitroGLYCERIN (NITROSTAT) 0.4 MG SL tablet PLACE 1 TABLET (0.4 MG TOTAL) UNDER THE TONGUE EVERY 5 (FIVE) MINUTES AS NEEDED FOR CHEST PAIN. MAXIMUM OF 3 DOSES. 08/04/19  Yes End, Harrell Gave, MD  Omega-3 Fatty Acids (FISH OIL PO) Take 1 capsule by mouth 2 (two) times daily.   Yes [provider]  omeprazole (PRILOSEC) 40 MG capsule TAKE 1 CAPSULE BY MOUTH EVERY DAY 05/12/19  Yes Sood, Elisabeth Cara, MD  PAZEO 0.7 % SOLN Place 1 drop into both  eyes daily. 11/26/17  Yes [provider]  promethazine (PHENERGAN) 25 MG tablet Take 25 mg by mouth 4 (four) times daily as needed for nausea or vomiting.  11/09/17  Yes [provider]  promethazine-codeine (PHENERGAN WITH CODEINE) 6.25-10 MG/5ML syrup Take 5 mLs by mouth at bedtime as needed for cough. 04/23/19  Yes Chesley Mires, MD  ticagrelor (BRILINTA) 90 MG TABS tablet Take 1 tablet (90 mg total) by mouth 2 (two) times daily. 02/23/19  Yes Theora Gianotti, NP  rosuvastatin (CRESTOR) 40 MG tablet Take 1 tablet (40 mg total) by mouth daily. 02/09/19 05/10/19  Pixie Casino, MD    Review of Systems    2 episodes of blurring of vision as outlined above.  She continues to have rare sharp and fleeting left-sided chest pain as well as occasional palpitations.  She denies PND, orthopnea, dizziness, syncope, edema, or early satiety.  All other systems reviewed and are otherwise negative except as noted above.  Physical Exam    VS:  BP 110/74 (BP Location: Left Arm, Patient Position: Sitting, Cuff Size: Normal)   Pulse 74   Ht 5' 3"  (1.6 m)   Wt 127 lb 2 oz (57.7 kg)   LMP 07/18/2016   SpO2 96%   BMI 22.52 kg/m  , BMI Body mass index is 22.52 kg/m. GEN: Well nourished, well developed, in no acute distress. HEENT: normal. Neck: Supple, no JVD, carotid bruits, or masses. Cardiac: RRR, no murmurs, rubs, or gallops. No clubbing, cyanosis, edema.  Radials/PT 2+ and equal bilaterally.  Respiratory:  Respirations regular and unlabored, clear to auscultation bilaterally. GI: Soft, nontender, nondistended, BS + x 4. MS: no deformity or atrophy. Skin: warm and dry, no rash. Neuro:  Strength and sensation are intact. Psych: Normal affect.  Accessory Clinical Findings    ECG personally reviewed by me today -regular sinus rhythm, 74, inferior infarct, delayed R wave progression- no acute changes.  Labs from primary care provider dated May 18, 2019  Hemoglobin 13.2,  hematocrit 41, WBC 9.1, platelets 246 Sodium 144, potassium 3.9, chloride 106, CO2 24, BUN 15, creatinine 0.82, glucose 118 Calcium 9.7, total protein 7.1, albumin 4.1 Total bilirubin 0.2, alkaline phosphatase 109, AST 17, ALT 12, globulin 3.0 Total cholesterol 159, triglycerides 113, HDL 62, LDL 74 TSH 1.201    Assessment & Plan    1.  Coronary artery disease: Status post RCA stenting x2 in January.  Ever since then, she has had intermittent, sharp, and fleeting chest pain.  Symptoms currently stable.  She does not experience any exertional symptoms and notes an increase in activity since her last visit.  She remains on aspirin, beta-blocker, Brilinta, and statin therapy.  2.  Essential hypertension: Stable on beta-blocker and calcium channel blocker therapy.  3.  Familial hyperlipidemia: Previous total cholesterol 333 with an LDL of 246 in January of this year.  She has been taking rosuvastatin 40 mg daily and just had follow-up labs in April with significant improvement-total cholesterol 159, triglycerides 113, HDL 62, LDL 74.  LFTs looked good.  We discussed how currently, LDL is not at goal (less than 70) and that we would consider adding Zetia.  She would like to continue with lifestyle modifications, as she has made significant modifications over the past 6 months prior to starting another medication, and I think that is reasonable.  4.  Palpitations/PSVT: Occasional palpitations which she feels have much improved with titration of beta-blocker therapy.  No changes today.  5.  Blurring of vision: 2 to 3 weeks ago, patient had blurring of vision bilaterally without associated symptoms, lasting an entire week prior to resolution.  Yesterday, she had left eye blurring which she noted was more significant than what had occurred a few weeks ago.  This lasted the entire day and was not present when she awoke this morning.  Vision has been normal today.  With familial hyperlipidemia and known  vascular disease, I am concerned about amaurosis fugax, though her description and duration of symptoms do not necessarily fit that clinical picture.  Regardless, I will follow-up an ESR, CRP, and carotid ultrasound.  I have also asked her to arrange follow-up with her ophthalmologist  6.  Disposition: We will arrange for follow-up in 4 to 6 months or sooner if necessary.   Murray Hodgkins, NP 08/18/2019, 4:14 PM

## 2019-08-18 NOTE — Patient Instructions (Signed)
Medication Instructions: Your physician recommends that you continue on your current medications as directed. Please refer to the Current Medication list given to you today.  *If you need a refill on your cardiac medications before your next appointment, please call your pharmacy*   Lab Work:Your physician recommends that you return for lab work today (ESR, CRP) If you have labs (blood work) drawn today and your tests are completely normal, you will receive your results only by: Marland Kitchen MyChart Message (if you have MyChart) OR . A paper copy in the mail If you have any lab test that is abnormal or we need to change your treatment, we will call you to review the results.   Testing/Procedures: 1- Your physician has requested that you have a carotid duplex. This test is an ultrasound of the carotid arteries in your neck. It looks at blood flow through these arteries that supply the brain with blood. Allow one hour for this exam. There are no restrictions or special instructions.   Follow-Up: At [facility], you and your health needs are our priority.  As part of our continuing mission to provide you with exceptional heart care, we have created designated Provider Care Teams.  These Care Teams include your primary Cardiologist (physician) and Advanced Practice Providers (APPs -  Physician Assistants and Nurse Practitioners) who all work together to provide you with the care you need, when you need it.  We recommend signing up for the patient portal called "MyChart".  Sign up information is provided on this After Visit Summary.  MyChart is used to connect with patients for Virtual Visits (Telemedicine).  Patients are able to view lab/test results, encounter notes, upcoming appointments, etc.  Non-urgent messages can be sent to your provider as well.   To learn more about what you can do with MyChart, go to [email or web address].    Your next appointment:   4 month(s)  The format for your next  appointment:   In Person  Provider:    You may see [name], MD or Murray Hodgkins, NP

## 2019-08-19 LAB — SEDIMENTATION RATE: Sed Rate: 31 mm/hr (ref 0–40)

## 2019-08-19 LAB — C-REACTIVE PROTEIN: CRP: 7 mg/L (ref 0–10)

## 2019-08-25 ENCOUNTER — Other Ambulatory Visit: Payer: Self-pay

## 2019-08-25 MED ORDER — AMLODIPINE BESYLATE 2.5 MG PO TABS: 2.5000 mg | ORAL_TABLET | Freq: Every day | ORAL | 2 refills | Status: DC

## 2019-09-01 ENCOUNTER — Encounter: Payer: Self-pay | Admitting: Internal Medicine

## 2019-09-01 NOTE — Telephone Encounter (Signed)
This encounter was created in error - please disregard.

## 2019-09-13 ENCOUNTER — Other Ambulatory Visit: Payer: Self-pay

## 2019-09-13 MED ORDER — TICAGRELOR 90 MG PO TABS: 90.0000 mg | ORAL_TABLET | Freq: Two times a day (BID) | ORAL | 6 refills | Status: DC

## 2019-09-28 ENCOUNTER — Other Ambulatory Visit: Payer: Self-pay | Admitting: Pulmonary Disease

## 2019-09-29 ENCOUNTER — Telehealth: Payer: Self-pay | Admitting: Pulmonary Disease

## 2019-09-29 DIAGNOSIS — J432 Centrilobular emphysema: Secondary | ICD-10-CM

## 2019-09-29 DIAGNOSIS — J441 Chronic obstructive pulmonary disease with (acute) exacerbation: Secondary | ICD-10-CM

## 2019-09-29 MED ORDER — ALBUTEROL SULFATE HFA 108 (90 BASE) MCG/ACT IN AERS: 2.0000 | INHALATION_SPRAY | RESPIRATORY_TRACT | 1 refills | Status: DC | PRN

## 2019-09-29 MED ORDER — ALBUTEROL SULFATE (2.5 MG/3ML) 0.083% IN NEBU: INHALATION_SOLUTION | RESPIRATORY_TRACT | 1 refills | Status: DC

## 2019-09-29 NOTE — Telephone Encounter (Signed)
Pt has pending appt in Oct 2021  Advised can give refills to last until then and needs to please keep appt for refills  Pt verbalized understanding  Rx was sent

## 2019-10-24 ENCOUNTER — Other Ambulatory Visit: Payer: Self-pay

## 2019-10-24 ENCOUNTER — Ambulatory Visit (INDEPENDENT_AMBULATORY_CARE_PROVIDER_SITE_OTHER): Payer: Medicare Other

## 2019-10-24 ENCOUNTER — Other Ambulatory Visit: Payer: Self-pay | Admitting: Nurse Practitioner

## 2019-10-24 DIAGNOSIS — H539 Unspecified visual disturbance: Secondary | ICD-10-CM

## 2019-10-24 DIAGNOSIS — I6523 Occlusion and stenosis of bilateral carotid arteries: Secondary | ICD-10-CM

## 2019-10-24 DIAGNOSIS — H538 Other visual disturbances: Secondary | ICD-10-CM | POA: Diagnosis not present

## 2019-10-28 ENCOUNTER — Telehealth: Payer: Self-pay | Admitting: *Deleted

## 2019-10-28 DIAGNOSIS — I6529 Occlusion and stenosis of unspecified carotid artery: Secondary | ICD-10-CM

## 2019-10-28 NOTE — Telephone Encounter (Signed)
-----   Message from Theora Gianotti, NP sent at 10/27/2019  4:30 PM EDT ----- 40-59% right internal carotid artery stenosis. 1-39% left internal carotid artery stenosis.  No significant stenoses.  F/u carotid u/s in 1 yr.

## 2019-10-28 NOTE — Telephone Encounter (Signed)
No answer. Left message to call back.   

## 2019-10-28 NOTE — Telephone Encounter (Signed)
Results called to pt. Pt verbalized understanding.  

## 2019-11-04 DIAGNOSIS — U071 COVID-19: Secondary | ICD-10-CM

## 2019-11-04 HISTORY — DX: COVID-19: U07.1

## 2019-11-07 ENCOUNTER — Ambulatory Visit (INDEPENDENT_AMBULATORY_CARE_PROVIDER_SITE_OTHER): Payer: Medicare Other | Admitting: Internal Medicine

## 2019-11-07 ENCOUNTER — Encounter: Payer: Self-pay | Admitting: Internal Medicine

## 2019-11-07 ENCOUNTER — Other Ambulatory Visit: Payer: Self-pay

## 2019-11-07 VITALS — BP 118/78 | HR 70 | Ht 63.0 in | Wt 125.1 lb

## 2019-11-07 DIAGNOSIS — R079 Chest pain, unspecified: Secondary | ICD-10-CM

## 2019-11-07 DIAGNOSIS — I2510 Atherosclerotic heart disease of native coronary artery without angina pectoris: Secondary | ICD-10-CM | POA: Diagnosis not present

## 2019-11-07 DIAGNOSIS — E785 Hyperlipidemia, unspecified: Secondary | ICD-10-CM | POA: Diagnosis not present

## 2019-11-07 NOTE — Patient Instructions (Signed)
Medication Instructions:  Your physician recommends that you continue on your current medications as directed. Please refer to the Current Medication list given to you today.  *If you need a refill on your cardiac medications before your next appointment, please call your pharmacy*   Lab Work: none If you have labs (blood work) drawn today and your tests are completely normal, you will receive your results only by: Marland Kitchen MyChart Message (if you have MyChart) OR . A paper copy in the mail If you have any lab test that is abnormal or we need to change your treatment, we will call you to review the results.   Testing/Procedures:  [place]  Your caregiver has ordered a Stress Test with nuclear imaging. The purpose of this test is to evaluate the blood supply to your heart muscle. This procedure is referred to as a "Non-Invasive Stress Test." This is because other than having an IV started in your vein, nothing is inserted or "invades" your body. Cardiac stress tests are done to find areas of poor blood flow to the heart by determining the extent of coronary artery disease (CAD). Some patients exercise on a treadmill, which naturally increases the blood flow to your heart, while others who are  unable to walk on a treadmill due to physical limitations have a pharmacologic/chemical stress agent called Lexiscan . This medicine will mimic walking on a treadmill by temporarily increasing your coronary blood flow.   Please note: these test may take anywhere between 2-4 hours to complete  PLEASE REPORT TO [facility] AT THE FIRST DESK WILL DIRECT YOU WHERE TO GO  Date of Procedure:_____________________________________  Arrival Time for Procedure:______________________________  Instructions regarding medication:   _xx_:  Hold betablocker(s) night before procedure and morning of procedure   PLEASE NOTIFY THE OFFICE AT LEAST 24 HOURS IN ADVANCE IF YOU ARE  UNABLE TO KEEP YOUR APPOINTMENT.  [phone] AND  PLEASE NOTIFY NUCLEAR MEDICINE AT [facility] AT LEAST 24 HOURS IN ADVANCE IF YOU ARE UNABLE TO KEEP YOUR APPOINTMENT. [phone]  How to prepare for your Myoview test:  1. Do not eat or drink after midnight 2. No caffeine for 24 hours prior to test 3. No smoking 24 hours prior to test. 4. Your medication may be taken with water.  If your doctor stopped a medication because of this test, do not take that medication. 5. Ladies, please do not wear dresses.  Skirts or pants are appropriate. Please wear a short sleeve shirt. 6. No perfume, cologne or lotion. 7. Wear comfortable walking shoes. No heels!  Follow-Up: At [facility], you and your health needs are our priority.  As part of our continuing mission to provide you with exceptional heart care, we have created designated Provider Care Teams.  These Care Teams include your primary Cardiologist (physician) and Advanced Practice Providers (APPs -  Physician Assistants and Nurse Practitioners) who all work together to provide you with the care you need, when you need it.  We recommend signing up for the patient portal called "MyChart".  Sign up information is provided on this After Visit Summary.  MyChart is used to connect with patients for Virtual Visits (Telemedicine).  Patients are able to view lab/test results, encounter notes, upcoming appointments, etc.  Non-urgent messages can be sent to your provider as well.   To learn more about what you can do with MyChart, go to [email or web address].    Your next appointment:   Keep follow up  as scheduled.     Cardiac Nuclear Scan A cardiac nuclear scan is a test that measures blood flow to the heart when a person is resting and when he or she is exercising. The test looks for problems such as:  Not enough blood reaching a portion of the heart.  The heart muscle not working normally. You may need this test if:  You have heart  disease.  You have had abnormal lab results.  You have had heart surgery or a balloon procedure to open up blocked arteries (angioplasty).  You have chest pain.  You have shortness of breath. In this test, a radioactive dye (tracer) is injected into your bloodstream. After the tracer has traveled to your heart, an imaging device is used to measure how much of the tracer is absorbed by or distributed to various areas of your heart. This procedure is usually done at a hospital and takes 2-4 hours. Tell a health care provider about:  Any allergies you have.  All medicines you are taking, including vitamins, herbs, eye drops, creams, and over-the-counter medicines.  Any problems you or family members have had with anesthetic medicines.  Any blood disorders you have.  Any surgeries you have had.  Any medical conditions you have.  Whether you are pregnant or may be pregnant. What are the risks? Generally, this is a safe procedure. However, problems may occur, including:  Serious chest pain and heart attack. This is only a risk if the stress portion of the test is done.  Rapid heartbeat.  Sensation of warmth in your chest. This usually passes quickly.  Allergic reaction to the tracer. What happens before the procedure?  Ask your health care provider about changing or stopping your regular medicines. This is especially important if you are taking diabetes medicines or blood thinners.  Follow instructions from your health care provider about eating or drinking restrictions.  Remove your jewelry on the day of the procedure. What happens during the procedure?  An IV will be inserted into one of your veins.  Your health care provider will inject a small amount of radioactive tracer through the IV.  You will wait for 20-40 minutes while the tracer travels through your bloodstream.  Your heart activity will be monitored with an electrocardiogram (ECG).  You will lie down on an  exam table.  Images of your heart will be taken for about 15-20 minutes.  You may also have a stress test. For this test, one of the following may be done: ? You will exercise on a treadmill or stationary bike. While you exercise, your heart's activity will be monitored with an ECG, and your blood pressure will be checked. ? You will be given medicines that will increase blood flow to parts of your heart. This is done if you are unable to exercise.  When blood flow to your heart has peaked, a tracer will again be injected through the IV.  After 20-40 minutes, you will get back on the exam table and have more images taken of your heart.  Depending on the type of tracer used, scans may need to be repeated 3-4 hours later.  Your IV line will be removed when the procedure is over. The procedure may vary among health care providers and hospitals. What happens after the procedure?  Unless your health care provider tells you otherwise, you may return to your normal schedule, including diet, activities, and medicines.  Unless your health care provider tells you otherwise, you may  increase your fluid intake. This will help to flush the contrast dye from your body. Drink enough fluid to keep your urine pale yellow.  Ask your health care provider, or the department that is doing the test: ? When will my results be ready? ? How will I get my results? Summary  A cardiac nuclear scan measures the blood flow to the heart when a person is resting and when he or she is exercising.  Tell your health care provider if you are pregnant.  Before the procedure, ask your health care provider about changing or stopping your regular medicines. This is especially important if you are taking diabetes medicines or blood thinners.  After the procedure, unless your health care provider tells you otherwise, increase your fluid intake. This will help flush the contrast dye from your body.  After the procedure,  unless your health care provider tells you otherwise, you may return to your normal schedule, including diet, activities, and medicines. This information is not intended to replace advice given to you by your health care provider. Make sure you discuss any questions you have with your health care provider. Document Revised: 07/06/2017 Document Reviewed: 07/06/2017 Elsevier Patient Education  [place].

## 2019-11-07 NOTE — Progress Notes (Signed)
Follow-up Outpatient Visit Date: 11/07/2019  Primary Care Provider: Martin Majestic, FNP [place] [id number]  Chief Complaint: Chest pain and shortness of breath  HPI:  Kimberly Kemp is a 54 y.o. female with history of coronary artery disease status post PCI to the RCAcomplicated by iatrogenic dissection of the RCA(02/22/2019), ischemic cardiomyopathy, hypertension, familial hyperlipidemia, stroke, questionable paroxysmal atrial fibrillation, questionable pulmonary embolism, COPD, colon cancer, hepatitis B, anxiety, and tobacco abuse, who presents for urgent evaluation of shortness of breath.  I last saw Kimberly Kemp in April, at which time she was doing well with improving stamina.  She continued to complain of intermittent nitroglycerin-responsive chest pain, as well as occasional palpitations.  Kimberly Kemp reports that Saturday (2 days ago), she had sudden onset of severe central chest pain associated with shortness of breath.  She had been playing with her grandchildren earlier in the day without any difficulty.  She had been resting on her couch and got up to make a breakfast shake when the symptoms began out of the blue while at rest.  She describes it as a severe pressure that came on abruptly then gradually improved and then suddenly worsened again.  The pain was reminiscent of what she felt during her catheterization earlier this year, only worse.  She took a sublingual nitroglycerin after about 10 minutes with prompt resolution of the discomfort and dyspnea.  She has felt back to normal since then.  She denies palpitations and lightheadedness.  She has mild chronic foot and ankle edema that is stable.  Within the last week, Kimberly Kemp had been able to do strenuous activities including rake her yard without difficulty.  Kimberly Kemp reports being compliant with her medications including dual antiplatelet therapy with aspirin and  ticagrelor.  --------------------------------------------------------------------------------------------------  Cardiovascular History & Procedures: Cardiovascular Problems:  Coronary artery disease s/p PCI to RCA (02/2019)  Paroxysmal atrial fibrillation(questionable; no objective documentation in the patient's chart)  Pulmonary embolism(questionable; no objective documentation in the patient's chart)  Shortness of breath  Risk Factors:  Prior stroke, hypertension, hyperlipidemia, and tobacco use  Cath/PCI:  RHC/LHC and PCI (02/22/2019): LMCA normal. LAD normal. LCx normal. Dominant RCA with 60 to 70% mid vessel stenosis. RA 9, RV 33/9, PA 26/13, PCWP 13, Fick CO/CI 7.8/4.9. LVEDP 15. Attempted IFR of mid RCA complicated by guide wire dissection necessitating stenting of the ostial through distal RCA with overlapping Resolute Onyx stents (2.75 x 38 and 3.0 x 38 mm). RCA was jailed with loss of flow.  CV Surgery:  None  EP Procedures and Devices:  14-day event monitor (01/05/2019):Predominantly sinus rhythm with rare PAC's and PVC's, as well as rare episodes of brief PSVT. Patient triggered events correspond to sinus rhythm.  Non-Invasive Evaluation(s):  TTE (02/23/2019): Normal LV size and wall thickness. LVEF 55-60% with inferoseptal akinesis. Normal RV size and function. No significant valvular abnormality. Normal PA pressure.  Dobutamine stress echocardiogram (11/16/2018): Low risk study with normal baseline LVEF and no evidence of inducible wall motion abnormality. LVEF 55-60%.  Exercise tolerance test (09/20/2018): Low to intermediate risk study with decreased exercise capacity and significant motion artifact.  Recent CV Pertinent Labs: Lab Results  Component Value Date   CHOL 305 (H) 09/22/2015   HDL 83 09/22/2015   LDLCALC 174 (H) 09/22/2015   TRIG 241 (H) 09/22/2015   CHOLHDL 3.7 09/22/2015   K 4.0 04/13/2019   BUN 17 04/13/2019   CREATININE  0.90 04/13/2019    Past medical  and surgical history were reviewed and updated in EPIC.  Current Meds  Medication Sig   albuterol (PROAIR HFA) 108 (90 Base) MCG/ACT inhaler Inhale 2 puffs into the lungs every 4 (four) hours as needed for wheezing or shortness of breath.   albuterol (PROVENTIL) (2.5 MG/3ML) 0.083% nebulizer solution TAKE 3 MLS (2.5 MG TOTAL) BY NEBULIZATION 4 TIMES DAILY AS NEEDED FOR WHEEZING OR SHORTNESS OF BREATH   amLODipine (NORVASC) 2.5 MG tablet Take 1 tablet (2.5 mg total) by mouth daily.   aspirin EC 81 MG tablet Take 81 mg by mouth daily.   benzonatate (TESSALON) 200 MG capsule Take 200 mg by mouth 3 (three) times daily as needed for cough.   budesonide (PULMICORT) 0.25 MG/2ML nebulizer solution TAKE 2 MLS (0.25 MG TOTAL) BY NEBULIZATION 2 (TWO) TIMES DAILY.   butalbital-acetaminophen-caffeine (FIORICET, ESGIC) 50-325-40 MG tablet Take 1 tablet by mouth 3 (three) times daily as needed for headache.   famotidine (PEPCID) 40 MG tablet Take 40 mg by mouth at bedtime.   gabapentin (NEURONTIN) 300 MG capsule Take 300 mg by mouth at bedtime.   ipratropium (ATROVENT) 0.02 % nebulizer solution Take 0.5 mg by nebulization 4 (four) times daily.   magic mouthwash w/lidocaine SOLN Take 10 mLs by mouth 4 (four) times daily as needed for mouth pain. (Patient taking differently: Take 10 mLs by mouth 2 (two) times daily. )   metoprolol tartrate (LOPRESSOR) 25 MG tablet Take one and a half tablets by mouth twice a day.   montelukast (SINGULAIR) 10 MG tablet Take 10 mg by mouth daily.   nitroGLYCERIN (NITROSTAT) 0.4 MG SL tablet PLACE 1 TABLET (0.4 MG TOTAL) UNDER THE TONGUE EVERY 5 (FIVE) MINUTES AS NEEDED FOR CHEST PAIN. MAXIMUM OF 3 DOSES.   Omega-3 Fatty Acids (FISH OIL PO) Take 1 capsule by mouth 2 (two) times daily.   omeprazole (PRILOSEC) 40 MG capsule TAKE 1 CAPSULE BY MOUTH EVERY DAY   PAZEO 0.7 % SOLN Place 1 drop into both eyes daily.   promethazine  (PHENERGAN) 25 MG tablet Take 25 mg by mouth 4 (four) times daily as needed for nausea or vomiting.    promethazine-codeine (PHENERGAN WITH CODEINE) 6.25-10 MG/5ML syrup Take 5 mLs by mouth at bedtime as needed for cough.   ticagrelor (BRILINTA) 90 MG TABS tablet Take 1 tablet (90 mg total) by mouth 2 (two) times daily.    Allergies: Penicillins, Omnipaque [iohexol], and Red dye  Social History   Tobacco Use   Smoking status: Current Every Day Smoker    Packs/day: 0.20    Years: 43.00    Pack years: 8.60    Types: Cigarettes   Smokeless tobacco: Never Used   Tobacco comment: 3 per day  Vaping Use   Vaping Use: Never used  Substance Use Topics   Alcohol use: No    Alcohol/week: 0.0 standard drinks   Drug use: No    Family History  Problem Relation Age of Onset   Emphysema Father    Prostate cancer Father    Liver disease Father    Emphysema Maternal Grandmother    Diabetes Mother    Heart disease Mother 46       CABG and valve replacement   Colon cancer Neg Hx     Review of Systems: A 12-system review of systems was performed and was negative except as noted in the HPI.  --------------------------------------------------------------------------------------------------  Physical Exam: BP 118/78 (BP Location: Left Arm, Patient Position: Sitting, Cuff Size: Normal)  Pulse 70    Ht 5\' 3"  (1.6 m)    Wt 125 lb 2 oz (56.8 kg)    LMP 07/18/2016    SpO2 99%    BMI 22.16 kg/m   General: NAD. Neck: No JVD or HJR. Lungs: Mildly diminished breath sounds throughout without wheezes or crackles. Heart: Regular rate and rhythm without murmurs, rubs, or gallops. Abdomen: Soft and nondistended.  Mild right upper quadrant tenderness noted.  Negative Murphy sign. Extremities: No lower extremity edema.  EKG: Normal sinus rhythm with possible left atrial enlargement and inferior infarct.  No significant change from prior tracing on 08/18/2019.  Lab Results  Component  Value Date   WBC 11.6 (H) 04/13/2019   HGB 13.4 04/13/2019   HCT 39.7 04/13/2019   MCV 92.1 04/13/2019   PLT 266 04/13/2019    Lab Results  Component Value Date   NA 140 04/13/2019   K 4.0 04/13/2019   CL 105 04/13/2019   CO2 25 04/13/2019   BUN 17 04/13/2019   CREATININE 0.90 04/13/2019   GLUCOSE 112 (H) 04/13/2019   ALT 25 05/06/2018    Lab Results  Component Value Date   CHOL 305 (H) 09/22/2015   HDL 83 09/22/2015   LDLCALC 174 (H) 09/22/2015   TRIG 241 (H) 09/22/2015   CHOLHDL 3.7 09/22/2015    --------------------------------------------------------------------------------------------------  ASSESSMENT AND PLAN: Coronary artery disease with chest pain: Kimberly Kemp had been doing well up until 2 days ago when she had sudden onset of chest pain while at rest.  Pain was reminiscent of what she felt at the time of her catheterization/PCI to the RCA earlier this year, albeit worse.  Pain resolved promptly with a single sublingual nitroglycerin.  Findings are certainly concerning for coronary insufficiency, though Kimberly Kemp had been able to do strenuous activities just a few days earlier without any symptoms.  Question if she has a component of vasospasm.  She feels back to baseline today.  Examination and EKG are unremarkable with the exception of inferior Q waves that are not new.  We have reviewed further options for evaluation and management of her chest pain, including continued medical therapy, noninvasive ischemia testing, and catheterization.  Kimberly Kemp is understandably will reluctant to proceed with catheterization; we have agreed to proceed with a pharmacologic myocardial perfusion stress test.  We have discussed the risks and benefits of the study, Kimberly Kemp has agreed to proceed.  In the meantime, she should continue her current medications including dual antiplatelet therapy with aspirin and ticagrelor.  I advised her to call [phone] if she has recurrent chest pain that does  not resolve promptly with sublingual nitroglycerin.  Hyperlipidemia: Continue rosuvastatin 40 mg daily, with ongoing follow-up through the lipid clinic.  Follow-up: Return to clinic next month as previously scheduled.  [name], MD 11/07/2019 1:32 PM

## 2019-11-13 ENCOUNTER — Other Ambulatory Visit: Payer: Self-pay | Admitting: Internal Medicine

## 2019-11-16 ENCOUNTER — Encounter
Admission: RE | Admit: 2019-11-16 | Discharge: 2019-11-16 | Disposition: A | Discharge status: Home / Self Care | Payer: Medicare Other | Source: Ambulatory Visit | Attending: Internal Medicine | Admitting: Internal Medicine

## 2019-11-16 ENCOUNTER — Other Ambulatory Visit: Payer: Self-pay | Admitting: *Deleted

## 2019-11-16 ENCOUNTER — Other Ambulatory Visit: Payer: Self-pay

## 2019-11-16 ENCOUNTER — Telehealth: Payer: Self-pay | Admitting: Internal Medicine

## 2019-11-16 DIAGNOSIS — R079 Chest pain, unspecified: Secondary | ICD-10-CM | POA: Insufficient documentation

## 2019-11-16 LAB — NM MYOCAR MULTI W/SPECT W/WALL MOTION / EF
Estimated workload: 1 METS
Exercise duration (min): 0 min
Exercise duration (sec): 0 s
LV dias vol: 77 mL (ref 46–106)
LV sys vol: 32 mL
MPHR: 166 {beats}/min
Peak HR: 101 {beats}/min
Percent HR: 60 %
Rest HR: 68 {beats}/min
SDS: 1
SRS: 18
SSS: 10
TID: 0.95

## 2019-11-16 MED ORDER — TECHNETIUM TC 99M TETROFOSMIN IV KIT
28.6450 | PACK | Freq: Once | INTRAVENOUS | Status: AC | PRN
  Administered 2019-11-16: 28.645 via INTRAVENOUS

## 2019-11-16 MED ORDER — TECHNETIUM TC 99M TETROFOSMIN IV KIT
10.0000 | PACK | Freq: Once | INTRAVENOUS | Status: AC | PRN
  Administered 2019-11-16: 9.524 via INTRAVENOUS

## 2019-11-16 MED ORDER — AMLODIPINE BESYLATE 2.5 MG PO TABS
2.5000 mg | ORAL_TABLET | Freq: Every day | ORAL | 2 refills | Status: DC
Start: 2019-11-16 — End: 2020-01-04

## 2019-11-16 MED ORDER — REGADENOSON 0.4 MG/5ML IV SOLN
0.4000 mg | Freq: Once | INTRAVENOUS | Status: AC
  Administered 2019-11-16: 0.4 mg via INTRAVENOUS
  Filled 2019-11-16: qty 5

## 2019-11-16 NOTE — Telephone Encounter (Signed)
Patient calling stating her diltiazem 120 refill was denied.   It looks like Ignacia Bayley, NP Discontinued this.  Discontinued by: Theora Gianotti, NP on 02/23/2019 15:10  Reason: Stop Taking at Discharge   Please advise if this should be refilled.

## 2019-11-16 NOTE — Telephone Encounter (Signed)
Patient calling States that refill for diltiazem 120 MG was denied - it is not on medication list  Patient would like to know if she should be off this medication  Please call to discuss

## 2019-11-17 MED ORDER — DILTIAZEM HCL ER COATED BEADS 120 MG PO CP24: 120.0000 mg | ORAL_CAPSULE | Freq: Every day | ORAL | 2 refills | Status: DC

## 2019-11-17 NOTE — Addendum Note (Signed)
Addended by: Annia Belt on: 11/17/2019 01:50 PM   Modules accepted: Orders

## 2019-11-17 NOTE — Telephone Encounter (Signed)
Sounds like she is tolerating the diltiazem well.  It is fine to refill diltiazem ER 120 mg daily.  [name], MD [facility]

## 2019-11-17 NOTE — Telephone Encounter (Signed)
Patient verbalized understanding that ok to continue diltiazem ER 120 mg daily. Rx sent to pharmacy.

## 2019-11-17 NOTE — Telephone Encounter (Signed)
Spoke to patient. States she never stopped taking the Diltiazem though it was discontinued after hospital stay in January 2021.  Patient never reported taking it at any of the following office visits this year so it is not on her medication list.  Patient says the pharmacy has continued to fill it and she's never stopped taking it. Advised her I will make Dr End aware and let him advise as to whether to continue it or not.

## 2019-11-30 ENCOUNTER — Encounter: Payer: Self-pay | Admitting: Pulmonary Disease

## 2019-11-30 ENCOUNTER — Other Ambulatory Visit: Payer: Self-pay

## 2019-11-30 ENCOUNTER — Ambulatory Visit (INDEPENDENT_AMBULATORY_CARE_PROVIDER_SITE_OTHER): Payer: Medicare Other | Admitting: Pulmonary Disease

## 2019-11-30 VITALS — BP 120/70 | HR 73 | Temp 98.4°F | Ht 63.0 in | Wt 127.4 lb

## 2019-11-30 DIAGNOSIS — J441 Chronic obstructive pulmonary disease with (acute) exacerbation: Secondary | ICD-10-CM

## 2019-11-30 DIAGNOSIS — J432 Centrilobular emphysema: Secondary | ICD-10-CM | POA: Diagnosis not present

## 2019-11-30 DIAGNOSIS — Z720 Tobacco use: Secondary | ICD-10-CM

## 2019-11-30 MED ORDER — PROMETHAZINE-CODEINE 6.25-10 MG/5ML PO SYRP: 5.0000 mL | ORAL_SOLUTION | Freq: Every evening | ORAL | 0 refills | Status: DC | PRN

## 2019-11-30 MED ORDER — AZITHROMYCIN 250 MG PO TABS: ORAL_TABLET | ORAL | 0 refills | Status: AC

## 2019-11-30 NOTE — Patient Instructions (Signed)
Zithromax 250 mg pill >> two pills on day 1, then 1 pill daily for next 4 days  Follow up in 6 months

## 2019-11-30 NOTE — Progress Notes (Signed)
[facility]  Chief Complaint  Patient presents with  . Follow-up    Congested and bad cough.    Constitutional:  BP 120/70 (BP Location: Left Arm, Cuff Size: Normal)   Pulse 73   Temp 98.4 F (36.9 C) (Oral)   Ht 5\' 3"  (1.6 m)   Wt 127 lb 6.4 oz (57.8 kg)   LMP 07/18/2016   SpO2 98%   BMI 22.57 kg/m   Past Medical History:  CVA, PE, PAF, HTN, HLD, Hep B, GERD, Depression, Cirrhosis, Colon cancer, chronic pain, Anxiety, CAD s/p stent  Past Surgical History:  Her  has a past surgical history that includes Bronchoscopy (March 2008, Feb 2012); Cesarean section; Appendectomy; RIGHT/LEFT HEART CATH AND CORONARY ANGIOGRAPHY (N/A, 02/22/2019); INTRAVASCULAR PRESSURE WIRE/FFR STUDY (N/A, 02/22/2019); CORONARY STENT INTERVENTION (N/A, 02/22/2019); and Cardiac catheterization.  Brief Summary:  Kimberly Kemp is a 54 y.o. 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      Subjective:   She had coronary artery stenting in January.  This has helped her breathing.  She is able to do more activities now.  She is down to 1/2 pack cigarettes per day.  She has noticed more cough and chest congestion over the past week.  She gets a burning in her chest at night when she coughs.  Not having fever.  Intermittent wheezing, but not bad.    Physical Exam:   Appearance - well kempt   ENMT - no sinus tenderness, no oral exudate, no LAN, Mallampati 3 airway, no stridor  Respiratory - decreased breath sounds bilaterally, scattered rhonchi, no wheezing  CV - s1s2 regular rate and rhythm, no murmurs  Ext - no clubbing, no edema  Skin - no rashes  Psych - normal mood and affect   Pulmonary testing:   A1AT 09/04/06 >> 236  Spirometry 10/26/09>>FEV1 2.59(88%), FEV1% 73  RAST 01/22/10 >> negative, IgE 11.9  Labs 01/22/10 >> ACE 46, RF < 20, ANA negative, ANCA negative  PFT 07/14/18 >> FEV1 2.08 (77%), FEV1% 65, TLC 4.11 (83%), DLCO 70%  Chest Imaging:    CT chest 06/26/10 >> upper lobe emphysema  CT chest 06/10/13 >> minimal centrilobular emphysema  CT chest 07/13/18 >> moderate to advanced emphysema  Sleep Tests:   HST 09/13/10 >> AHI 1, SpO2 low 91%  Cardiac Tests:   Echo 04/15/06 >> EF 55 to 65%  LHC 02/22/19 >> RCA 70% occlusion, RCA dissection, DES  Echo 02/23/19 >> EF 55 to 60%  Social History:  She  reports that she has been smoking cigarettes. She has a 8.60 pack-year smoking history. She has never used smokeless tobacco. She reports that she does not drink alcohol and does not use drugs.  Family History:  Her family history includes Diabetes in her mother; Emphysema in her father and maternal grandmother; Heart disease (age of onset: 21) in her mother; Liver disease in her father; Prostate cancer in her father.     Assessment/Plan:   COPD with emphysema. - she has recurrent mild exacerbation - will give course of zithromax - don't think she needs prednisone at this time - continue pulmicort, albuterol, atrovent, singulair - refilled phenergan with codeine for cough while she is being treated for exacerbation  Tobacco abuse. - she will gradually try to cut down on smoking; encouraged her to keep up with her progress  CAD s/p DES. - followed by Dr. Harrell Gave End with [facility]  Vaccinations. - she  reports getting chest discomfort within minutes during prior vaccinations, and opted against trying any future vaccinations  Time Spent Involved in Patient Care on Day of Examination:  32 minutes  Follow up:  Patient Instructions  Zithromax 250 mg pill >> two pills on day 1, then 1 pill daily for next 4 days  Follow up in 6 months   Medication List:   Allergies as of 11/30/2019      Reactions   Penicillins Hives   Did it involve swelling of the face/tongue/throat, SOB, or low BP? No Did it involve sudden or severe rash/hives, skin peeling, or any reaction on the inside of your mouth or nose?  Yes Did you need to seek medical attention at a hospital or doctor's office? Yes When did it last happen?10 Years If all above answers are "NO", may proceed with cephalosporin use.   Omnipaque [iohexol] Itching      Red Dye Itching      Medication List       Accurate as of November 30, 2019 12:33 PM. If you have any questions, ask your nurse or doctor.        albuterol 108 (90 Base) MCG/ACT inhaler Commonly known as: ProAir HFA Inhale 2 puffs into the lungs every 4 (four) hours as needed for wheezing or shortness of breath.   albuterol (2.5 MG/3ML) 0.083% nebulizer solution Commonly known as: PROVENTIL TAKE 3 MLS (2.5 MG TOTAL) BY NEBULIZATION 4 TIMES DAILY AS NEEDED FOR WHEEZING OR SHORTNESS OF BREATH   amLODipine 2.5 MG tablet Commonly known as: NORVASC Take 1 tablet (2.5 mg total) by mouth daily.   aspirin EC 81 MG tablet Take 81 mg by mouth daily.   azithromycin 250 MG tablet Commonly known as: ZITHROMAX Take 2 tablets (500 mg total) by mouth daily for 1 day, THEN 1 tablet (250 mg total) daily for 4 days. Start taking on: November 30, 2019 Started by: Chesley Mires, MD   benzonatate 200 MG capsule Commonly known as: TESSALON Take 200 mg by mouth 3 (three) times daily as needed for cough.   budesonide 0.25 MG/2ML nebulizer solution Commonly known as: PULMICORT TAKE 2 MLS (0.25 MG TOTAL) BY NEBULIZATION 2 (TWO) TIMES DAILY.   butalbital-acetaminophen-caffeine 50-325-40 MG tablet Commonly known as: FIORICET Take 1 tablet by mouth 3 (three) times daily as needed for headache.   diltiazem 120 MG 24 hr capsule Commonly known as: CARDIZEM CD Take 1 capsule (120 mg total) by mouth daily.   famotidine 40 MG tablet Commonly known as: PEPCID Take 40 mg by mouth at bedtime.   FISH OIL PO Take 1 capsule by mouth 2 (two) times daily.   gabapentin 300 MG capsule Commonly known as: NEURONTIN Take 300 mg by mouth at bedtime.   HYDROcodone-acetaminophen 5-325 MG  tablet Commonly known as: NORCO/VICODIN Take by mouth.   ipratropium 0.02 % nebulizer solution Commonly known as: ATROVENT Take 0.5 mg by nebulization 4 (four) times daily.   magic mouthwash w/lidocaine Soln Take 10 mLs by mouth 4 (four) times daily as needed for mouth pain. What changed: when to take this   metoprolol tartrate 25 MG tablet Commonly known as: LOPRESSOR Take one and a half tablets by mouth twice a day.   montelukast 10 MG tablet Commonly known as: SINGULAIR Take 10 mg by mouth daily.   nitroGLYCERIN 0.4 MG SL tablet Commonly known as: NITROSTAT PLACE 1 TABLET (0.4 MG TOTAL) UNDER THE TONGUE EVERY 5 (FIVE) MINUTES AS NEEDED FOR CHEST PAIN.  MAXIMUM OF 3 DOSES.   omeprazole 40 MG capsule Commonly known as: PRILOSEC TAKE 1 CAPSULE BY MOUTH EVERY DAY   Pazeo 0.7 % Soln Generic drug: Olopatadine HCl Place 1 drop into both eyes daily.   promethazine 25 MG tablet Commonly known as: PHENERGAN Take 25 mg by mouth 4 (four) times daily as needed for nausea or vomiting.   promethazine-codeine 6.25-10 MG/5ML syrup Commonly known as: PHENERGAN with CODEINE Take 5 mLs by mouth at bedtime as needed for cough.   rosuvastatin 40 MG tablet Commonly known as: CRESTOR Take 1 tablet (40 mg total) by mouth daily.   ticagrelor 90 MG Tabs tablet Commonly known as: BRILINTA Take 1 tablet (90 mg total) by mouth 2 (two) times daily.       Signature:  Chesley Mires, MD [facility] Pager - [phone] 11/30/2019, 12:33 PM

## 2019-12-07 ENCOUNTER — Other Ambulatory Visit: Payer: Self-pay | Admitting: Pulmonary Disease

## 2019-12-21 ENCOUNTER — Ambulatory Visit: Payer: Medicare Other | Admitting: Internal Medicine

## 2020-01-04 ENCOUNTER — Other Ambulatory Visit: Payer: Self-pay | Admitting: *Deleted

## 2020-01-04 ENCOUNTER — Other Ambulatory Visit: Payer: Self-pay | Admitting: Internal Medicine

## 2020-01-04 DIAGNOSIS — E7801 Familial hypercholesterolemia: Secondary | ICD-10-CM

## 2020-01-04 MED ORDER — AMLODIPINE BESYLATE 2.5 MG PO TABS
2.5000 mg | ORAL_TABLET | Freq: Every day | ORAL | 0 refills | Status: DC
Start: 2020-01-04 — End: 2020-03-08

## 2020-01-05 ENCOUNTER — Other Ambulatory Visit: Payer: Self-pay

## 2020-01-05 ENCOUNTER — Ambulatory Visit (INDEPENDENT_AMBULATORY_CARE_PROVIDER_SITE_OTHER): Payer: Medicare Other | Admitting: Nurse Practitioner

## 2020-01-05 ENCOUNTER — Encounter: Payer: Self-pay | Admitting: Nurse Practitioner

## 2020-01-05 VITALS — BP 114/64 | HR 84 | Ht 63.0 in | Wt 124.0 lb

## 2020-01-05 DIAGNOSIS — I2510 Atherosclerotic heart disease of native coronary artery without angina pectoris: Secondary | ICD-10-CM | POA: Diagnosis not present

## 2020-01-05 DIAGNOSIS — I10 Essential (primary) hypertension: Secondary | ICD-10-CM | POA: Diagnosis not present

## 2020-01-05 DIAGNOSIS — Z720 Tobacco use: Secondary | ICD-10-CM

## 2020-01-05 DIAGNOSIS — E785 Hyperlipidemia, unspecified: Secondary | ICD-10-CM | POA: Diagnosis not present

## 2020-01-05 NOTE — Patient Instructions (Signed)
Medication Instructions:   1. Your physician recommends that you continue on your current medications as directed. Please refer to the Current Medication list given to you today.  *If you need a refill on your cardiac medications before your next appointment, please call your pharmacy*   Lab Work:  1. None Ordered  If you have labs (blood work) drawn today and your tests are completely normal, you will receive your results only by: Marland Kitchen MyChart Message (if you have MyChart) OR . A paper copy in the mail If you have any lab test that is abnormal or we need to change your treatment, we will call you to review the results.   Testing/Procedures:  1. None Ordered   Follow-Up: At [facility], you and your health needs are our priority.  As part of our continuing mission to provide you with exceptional heart care, we have created designated Provider Care Teams.  These Care Teams include your primary Cardiologist (physician) and Advanced Practice Providers (APPs -  Physician Assistants and Nurse Practitioners) who all work together to provide you with the care you need, when you need it.  We recommend signing up for the patient portal called "MyChart".  Sign up information is provided on this After Visit Summary.  MyChart is used to connect with patients for Virtual Visits (Telemedicine).  Patients are able to view lab/test results, encounter notes, upcoming appointments, etc.  Non-urgent messages can be sent to your provider as well.   To learn more about what you can do with MyChart, go to [email or web address].    Your next appointment:   3-4  month(s)  The format for your next appointment:   In Person  Provider:   You may see [name], MD or Murray Hodgkins, NP

## 2020-01-05 NOTE — Progress Notes (Signed)
Office Visit    Patient Name: Kimberly Kemp Date of Encounter: 01/05/2020  Primary Care Provider:  Martin Majestic, FNP Primary Cardiologist:  [name], MD  Chief Complaint    54 year old female with a history of CAD status post PCI to the RCA in January 4010 complicated by iatrogenic dissection of the RCA, ischemic cardiomyopathy, hypertension, familial hyperlipidemia, stroke, questionable history of paroxysmal atrial fibrillation and pulmonary embolism, COPD, colon cancer, hepatitis B, anxiety, and tobacco abuse, who presents for follow-up of CAD and chest pain.  Past Medical History    Past Medical History:  Diagnosis Date  . ? h/o Paroxysmal atrial fibrillation (HCC)   . ? h/o Pulmonary embolism ([place])   . Anxiety   . Asthma   . Atypical chest pain    a. 09/2018 ETT: poor exercise tolerance; b. 11/2018 Dobutamine Echo: No wall motion abnormalities, EF 55-60%.  Marland Kitchen CAD (coronary artery disease)    a. 02/2019 Cath/PCI: LM nl, LAD nl, LCX nl, RCA 49m (attempted iFR->wire dissection-->3.0x38 Resolute Onyx DES prox, 2.75x38 Resolute Onyx DES mid), RPDA jailed by stent. EF 65%; b. 11/2019 MV: EF 55-65%. medium-sized apical defect - likely diaph atten. No ischemia-->low risk.  . Chronic pain   . Cirrhosis ([place])   . Colon cancer ([place])   . COPD (chronic obstructive pulmonary disease) ([place])   . COVID-19 virus infection 11/2019  . Depression   . Emphysema   . GERD (gastroesophageal reflux disease)   . Heart murmur   . Hepatitis B   . History of echocardiogram    a. 02/2019 Echo: EF 55-60%. No rwma. No significant valvular disease.  Marland Kitchen Hyperlipidemia   . Hypertension   . Palpitations    a. 02/2019 Zio: Sinus, avg 83 (55-135). Rare PACs/PVCs. Four atrial runs, longest 7 beats, fastest 146 bpm. Triggered event->sinus rhythm & artifact.  . Stroke ([place])   . Tobacco abuse    Past Surgical History:  Procedure Laterality Date  . APPENDECTOMY     microscopic   . BRONCHOSCOPY   March 2008, Feb 2012  . CARDIAC CATHETERIZATION    . CESAREAN SECTION    . CORONARY STENT INTERVENTION N/A 02/22/2019   Procedure: CORONARY STENT INTERVENTION;  Surgeon: [name], MD;  Location: [facility];  Service: Cardiovascular;  Laterality: N/A;  . INTRAVASCULAR PRESSURE WIRE/FFR STUDY N/A 02/22/2019   Procedure: INTRAVASCULAR PRESSURE WIRE/FFR STUDY;  Surgeon: [name], MD;  Location: [facility];  Service: Cardiovascular;  Laterality: N/A;  . RIGHT/LEFT HEART CATH AND CORONARY ANGIOGRAPHY N/A 02/22/2019   Procedure: RIGHT/LEFT HEART CATH AND CORONARY ANGIOGRAPHY;  Surgeon: [name], MD;  Location: [facility];  Service: Cardiovascular;  Laterality: N/A;    Allergies  Allergies  Allergen Reactions  . Penicillins Hives    Did it involve swelling of the face/tongue/throat, SOB, or low BP? No Did it involve sudden or severe rash/hives, skin peeling, or any reaction on the inside of your mouth or nose? Yes Did you need to seek medical attention at a hospital or doctor's office? Yes When did it last happen?10 Years If all above answers are "NO", may proceed with cephalosporin use.   . Omnipaque [Iohexol] Itching       . Red Dye Itching    History of Present Illness    54 year old female with the above complex past medical history including CAD, ischemic cardiomyopathy, hypertension, familial hyperlipidemia, stroke, questionable history of A. fib and PE, COPD, colon cancer,  hepatitis B, anxiety, and ongoing tobacco abuse.  In the setting of ongoing chest pain despite normal ETT in August 2020, and normal dobutamine stress echo in October 2020, she underwent diagnostic catheterization on February 10, 2019, which was notable for moderate proximal RCA disease. iFR was attempted in the RCA and resulted in dissection and ST segment elevation.  She then underwent drug-eluting stent placement to the proximal and mid RCA.  The RPDA was jailed  by the stent.  Echo showed normal LV function with only a small region of akinesis.  She did note occasional fleeting chest pain at follow-up visit following PCI and on her most recent visit in October, she reported an episode of severe chest pressure that occurred at rest and resolved with sublingual nitroglycerin.  Stress testing was undertaken and was low risk, showing an EF of 55-65% with a medium size apical defect, which was felt to be diaphragmatic attenuation.  Since her visit in October and a stress test, she has been feeling well.  She continues to be active in her grandchildren's life and plays with them throughout the day.  She has not had any recurrence of chest pain.  Later in October, her husband was diagnosed with Covid.  He was previously vaccinated and only had symptoms for a few days.  Unfortunately, she also tested positive for Covid and had more severe symptoms for several weeks.  She says she was laid up for much of November and continues to have occasional dyspnea and wheezing, which she notices most at night.  She does continue to smoke 4 to 5 cigarettes/day.  She has not been having any palpitations and denies PND, orthopnea, dizziness, syncope, or early satiety.  She occasionally notes lower extremity swelling at the end of the day.  Home Medications    Prior to Admission medications   Medication Sig Start Date End Date Taking? Authorizing Provider  albuterol (PROAIR HFA) 108 (90 Base) MCG/ACT inhaler Inhale 2 puffs into the lungs every 4 (four) hours as needed for wheezing or shortness of breath. 09/29/19   Chesley Mires, MD  albuterol (PROVENTIL) (2.5 MG/3ML) 0.083% nebulizer solution TAKE 3 MLS (2.5 MG TOTAL) BY NEBULIZATION 4 TIMES DAILY AS NEEDED FOR WHEEZING OR SHORTNESS OF BREATH 09/29/19   Chesley Mires, MD  amLODipine (NORVASC) 2.5 MG tablet Take 1 tablet (2.5 mg total) by mouth daily. 01/04/20   Theora Gianotti, NP  aspirin EC 81 MG tablet Take 81 mg by mouth  daily.    [provider]  benzonatate (TESSALON) 200 MG capsule Take 200 mg by mouth 3 (three) times daily as needed for cough.    [provider]  budesonide (PULMICORT) 0.25 MG/2ML nebulizer solution TAKE 2 MLS (0.25 MG TOTAL) BY NEBULIZATION 2 (TWO) TIMES DAILY. 12/07/19   Chesley Mires, MD  butalbital-acetaminophen-caffeine (FIORICET, ESGIC) [phone] MG tablet Take 1 tablet by mouth 3 (three) times daily as needed for headache. 11/19/17   [provider]  diltiazem (CARDIZEM CD) 120 MG 24 hr capsule Take 1 capsule (120 mg total) by mouth daily. 11/17/19 02/15/20  End, Harrell Gave, MD  famotidine (PEPCID) 40 MG tablet Take 40 mg by mouth at bedtime.    [provider]  gabapentin (NEURONTIN) 300 MG capsule Take 300 mg by mouth at bedtime. 11/14/17   [provider]  HYDROcodone-acetaminophen (NORCO/VICODIN) 5-325 MG tablet Take by mouth. 09/20/19   [provider]  ipratropium (ATROVENT) 0.02 % nebulizer solution Take 0.5 mg by nebulization 4 (four)  times daily.    [provider]  magic mouthwash w/lidocaine SOLN Take 10 mLs by mouth 4 (four) times daily as needed for mouth pain. Patient taking differently: Take 10 mLs by mouth 2 (two) times daily.  09/10/18   Triplett, Johnette Abraham B, FNP  metoprolol tartrate (LOPRESSOR) 25 MG tablet Take one and a half tablets by mouth twice a day. 07/25/19   End, Harrell Gave, MD  montelukast (SINGULAIR) 10 MG tablet Take 10 mg by mouth daily.    [provider]  nitroGLYCERIN (NITROSTAT) 0.4 MG SL tablet PLACE 1 TABLET (0.4 MG TOTAL) UNDER THE TONGUE EVERY 5 (FIVE) MINUTES AS NEEDED FOR CHEST PAIN. MAXIMUM OF 3 DOSES. 08/04/19   End, Harrell Gave, MD  Omega-3 Fatty Acids (FISH OIL PO) Take 1 capsule by mouth 2 (two) times daily.    [provider]  omeprazole (PRILOSEC) 40 MG capsule TAKE 1 CAPSULE BY MOUTH EVERY DAY 05/12/19   Chesley Mires, MD  PAZEO 0.7 % SOLN Place 1 drop into both eyes daily.  11/26/17   [provider]  promethazine (PHENERGAN) 25 MG tablet Take 25 mg by mouth 4 (four) times daily as needed for nausea or vomiting.  11/09/17   [provider]  promethazine-codeine (PHENERGAN WITH CODEINE) 6.25-10 MG/5ML syrup Take 5 mLs by mouth at bedtime as needed for cough. 11/30/19   Chesley Mires, MD  rosuvastatin (CRESTOR) 40 MG tablet TAKE 1 TABLET BY MOUTH EVERY DAY 01/05/20   Hilty, Nadean Corwin, MD  ticagrelor (BRILINTA) 90 MG TABS tablet Take 1 tablet (90 mg total) by mouth 2 (two) times daily. 09/13/19   Theora Gianotti, NP    Review of Systems    Some degree of chronic dyspnea on exertion which worsened slightly in the setting of recent Covid infection.  She occasionally notes lower extremity swelling at the end of the day.  She denies chest pain, palpitations, PND, orthopnea, dizziness, syncope, or early satiety.  All other systems reviewed and are otherwise negative except as noted above.  Physical Exam    VS:  BP 114/64   Pulse 84   Ht 5\' 3"  (1.6 m)   Wt 124 lb (56.2 kg)   LMP 07/18/2016   BMI 21.97 kg/m  , BMI Body mass index is 21.97 kg/m. GEN: Well nourished, well developed, in no acute distress. HEENT: normal. Neck: Supple, no JVD, carotid bruits, or masses. Cardiac: RRR, no murmurs, rubs, or gallops. No clubbing, cyanosis, edema.  Radials/PT 2+ and equal bilaterally.  Respiratory:  Respirations regular and unlabored, clear to auscultation bilaterally. GI: Soft, nontender, nondistended, BS + x 4. MS: no deformity or atrophy. Skin: warm and dry, no rash. Neuro:  Strength and sensation are intact. Psych: Normal affect.  Accessory Clinical Findings     Lab Results  Component Value Date   WBC 11.6 (H) 04/13/2019   HGB 13.4 04/13/2019   HCT 39.7 04/13/2019   MCV 92.1 04/13/2019   PLT 266 04/13/2019   Lab Results  Component Value Date   CREATININE 0.90 04/13/2019   BUN 17 04/13/2019   NA 140 04/13/2019   K 4.0 04/13/2019    CL 105 04/13/2019   CO2 25 04/13/2019   Lab Results  Component Value Date   ALT 25 05/06/2018   AST 25 05/06/2018   ALKPHOS 86 05/06/2018   BILITOT 0.7 05/06/2018    Labs from primary care provider dated May 18, 2019  Hemoglobin 13.2, hematocrit 41, WBC 9.1, platelets 246 Sodium 144,  potassium 3.9, chloride 106, CO2 24, BUN 15, creatinine 0.82, glucose 118 Calcium 9.7, total protein 7.1, albumin 4.1 Total bilirubin 0.2, alkaline phosphatase 109, AST 17, ALT 12, globulin 3.0 Total cholesterol 159, triglycerides 113, HDL 62, LDL 74 TSH 1.201    Assessment & Plan    1.  Coronary artery disease: Status post drug-eluting stent placement to the RCA x2 with jailed RPDA in January 2021.  At her last visit, she reported an episode of chest discomfort.  This was followed by stress testing which was nonischemic and low risk.  She has had no recurrence of symptoms since her last visit and remains relatively active.  Continue aspirin, beta-blocker, Brilinta, and statin therapy.  She is also on calcium channel blocker therapy for potential coronary vasospasm.  2.  Essential hypertension: Stable on beta-blocker and calcium channel blocker.  3.  Hyperlipidemia: LDL of 74 on rosuvastatin therapy in April.  She was previously offered Zetia and deferred.  4.  COPD/recent COVID-19 infection/Ongoing tobacco abuse: Followed by pulmonology.  She has some degree of chronic dyspnea on exertion which was worsened during Covid infection.  She occasionally notes wheezing at night which she attributes to Covid infection.  Lungs are clear today.  She continues to smoke 4 to 5 cigarettes/day.  Complete cessation advised.  5.  Disposition: Follow-up in clinic in 3 to 4 months or sooner if necessary.   Murray Hodgkins, NP 01/05/2020, 11:59 AM

## 2020-01-14 ENCOUNTER — Telehealth: Payer: Self-pay | Admitting: Critical Care Medicine

## 2020-01-14 DIAGNOSIS — J432 Centrilobular emphysema: Secondary | ICD-10-CM

## 2020-01-14 NOTE — Telephone Encounter (Signed)
Kimberly Kemp called because her nebulizer machine has stopped working. When I returned the call her husband answered. She had left the house to use her daughter's nebulizer. I let her husband know that they need to call her DME company that provided her nebulizer and request a new machine. I asked if she needs orders for inhalers in the interim, but her husband asked me to call back to speak with her in a few minutes.  Julian Hy, DO 01/14/20 4:00 PM [facility]

## 2020-01-14 NOTE — Telephone Encounter (Signed)
I spoke to Ms. Owens Shark. She has been using her grandson's nebulizer with her mouthpiece. They have tried contacting her DME company, who is closed. She has her inhaler but thinks she needs her nebulizer for breathing treatments in addition to this. I told her she can continue to use the nebulizer she has access to and can use her mouthpiece. She should call her DME company again on Monday.  Julian Hy, DO 01/14/20 4:22 PM [facility]

## 2020-01-16 NOTE — Telephone Encounter (Signed)
Okay to order nebulizer?  

## 2020-01-16 NOTE — Telephone Encounter (Signed)
Pt calling in stating that she does not know who her DME company is - states she called [employer] and they don't have her in their system - states that anytime she has issues- Dr. Halford Chessman and Nurses call in for her - please advise [phone]

## 2020-01-16 NOTE — Telephone Encounter (Signed)
Per our charts pt's DME is [employer] (which has been bought out by [employer] since we last sent in anything for this patient).   VS ok to order new nebulizer for pt?

## 2020-01-16 NOTE — Telephone Encounter (Signed)
Called and spoke with pt letting her know that VS was okay with [place] ordering her a nebulizer and she verbalized understanding. Order has been placed. Nothing further needed.

## 2020-02-07 ENCOUNTER — Other Ambulatory Visit: Payer: Self-pay | Admitting: Pulmonary Disease

## 2020-02-07 DIAGNOSIS — J441 Chronic obstructive pulmonary disease with (acute) exacerbation: Secondary | ICD-10-CM

## 2020-02-07 DIAGNOSIS — J432 Centrilobular emphysema: Secondary | ICD-10-CM

## 2020-02-17 ENCOUNTER — Other Ambulatory Visit: Payer: Self-pay | Admitting: Internal Medicine

## 2020-03-08 ENCOUNTER — Other Ambulatory Visit: Payer: Self-pay

## 2020-03-08 MED ORDER — AMLODIPINE BESYLATE 2.5 MG PO TABS
2.5000 mg | ORAL_TABLET | Freq: Every day | ORAL | 0 refills | Status: DC
Start: 2020-03-08 — End: 2020-04-02

## 2020-03-10 ENCOUNTER — Emergency Department
Admission: EM | Admit: 2020-03-10 | Discharge: 2020-03-10 | Disposition: A | Discharge status: Home / Self Care | Payer: Medicare Other | Attending: Emergency Medicine | Admitting: Emergency Medicine

## 2020-03-10 ENCOUNTER — Emergency Department: Payer: Medicare Other

## 2020-03-10 ENCOUNTER — Encounter: Payer: Self-pay | Admitting: Intensive Care

## 2020-03-10 ENCOUNTER — Other Ambulatory Visit: Payer: Self-pay

## 2020-03-10 DIAGNOSIS — Z8616 Personal history of COVID-19: Secondary | ICD-10-CM | POA: Insufficient documentation

## 2020-03-10 DIAGNOSIS — Z955 Presence of coronary angioplasty implant and graft: Secondary | ICD-10-CM | POA: Diagnosis not present

## 2020-03-10 DIAGNOSIS — W19XXXA Unspecified fall, initial encounter: Secondary | ICD-10-CM | POA: Diagnosis not present

## 2020-03-10 DIAGNOSIS — J449 Chronic obstructive pulmonary disease, unspecified: Secondary | ICD-10-CM | POA: Insufficient documentation

## 2020-03-10 DIAGNOSIS — Z85038 Personal history of other malignant neoplasm of large intestine: Secondary | ICD-10-CM | POA: Insufficient documentation

## 2020-03-10 DIAGNOSIS — Z79899 Other long term (current) drug therapy: Secondary | ICD-10-CM | POA: Diagnosis not present

## 2020-03-10 DIAGNOSIS — I2510 Atherosclerotic heart disease of native coronary artery without angina pectoris: Secondary | ICD-10-CM | POA: Insufficient documentation

## 2020-03-10 DIAGNOSIS — I10 Essential (primary) hypertension: Secondary | ICD-10-CM | POA: Insufficient documentation

## 2020-03-10 DIAGNOSIS — S60222A Contusion of left hand, initial encounter: Secondary | ICD-10-CM | POA: Diagnosis not present

## 2020-03-10 DIAGNOSIS — Z7982 Long term (current) use of aspirin: Secondary | ICD-10-CM | POA: Insufficient documentation

## 2020-03-10 DIAGNOSIS — J45909 Unspecified asthma, uncomplicated: Secondary | ICD-10-CM | POA: Insufficient documentation

## 2020-03-10 DIAGNOSIS — F17210 Nicotine dependence, cigarettes, uncomplicated: Secondary | ICD-10-CM | POA: Diagnosis not present

## 2020-03-10 DIAGNOSIS — S6991XA Unspecified injury of right wrist, hand and finger(s), initial encounter: Secondary | ICD-10-CM | POA: Diagnosis present

## 2020-03-10 DIAGNOSIS — Z7951 Long term (current) use of inhaled steroids: Secondary | ICD-10-CM | POA: Diagnosis not present

## 2020-03-10 MED ORDER — HYDROCODONE-ACETAMINOPHEN 5-325 MG PO TABS: 1.0000 | ORAL_TABLET | Freq: Four times a day (QID) | ORAL | 0 refills | Status: DC | PRN

## 2020-03-10 NOTE — Discharge Instructions (Addendum)
Follow-up with your primary care provider if any continued problems or concerns.  Also ice and elevation today.  Hand protection to prevent additional injury to your hand.  A prescription for hydrocodone was sent to your pharmacy as needed for moderate to severe pain.  Do not take this medication while driving as it could cause drowsiness and increase your risk for injury.  Your hand most likely will be bruised for 7 to 10 days.

## 2020-03-10 NOTE — ED Triage Notes (Signed)
Patient reports having mechanical fall and injuring left hand

## 2020-03-10 NOTE — ED Provider Notes (Signed)
[facility] Emergency Department Provider Note  ____________________________________________   Event Date/Time   First MD Initiated Contact with Patient 03/10/20 [phone]     (approximate)  I have reviewed the triage vital signs and the nursing notes.   HISTORY  Chief Complaint Hand Pain   HPI Kimberly Kemp is a 55 y.o. female presents to the ED after mechanical fall earlier this morning when she was going to the bathroom.  Patient states that she injured her left hand.  She denies any head injury or loss of consciousness.  She has continued to have pain in her left hand with bruising and swelling.  She rates her pain as a 10/10.       Past Medical History:  Diagnosis Date  . ? h/o Paroxysmal atrial fibrillation (HCC)   . ? h/o Pulmonary embolism ([place])   . Anxiety   . Asthma   . Atypical chest pain    a. 09/2018 ETT: poor exercise tolerance; b. 11/2018 Dobutamine Echo: No wall motion abnormalities, EF 55-60%.  Marland Kitchen CAD (coronary artery disease)    a. 02/2019 Cath/PCI: LM nl, LAD nl, LCX nl, RCA 33m (attempted iFR->wire dissection-->3.0x38 Resolute Onyx DES prox, 2.75x38 Resolute Onyx DES mid), RPDA jailed by stent. EF 65%; b. 11/2019 MV: EF 55-65%. medium-sized apical defect - likely diaph atten. No ischemia-->low risk.  . Chronic pain   . Cirrhosis ([place])   . Colon cancer ([place])   . COPD (chronic obstructive pulmonary disease) ([place])   . COVID-19 virus infection 11/2019  . Depression   . Emphysema   . GERD (gastroesophageal reflux disease)   . Heart murmur   . Hepatitis B   . History of echocardiogram    a. 02/2019 Echo: EF 55-60%. No rwma. No significant valvular disease.  Marland Kitchen Hyperlipidemia   . Hypertension   . Palpitations    a. 02/2019 Zio: Sinus, avg 83 (55-135). Rare PACs/PVCs. Four atrial runs, longest 7 beats, fastest 146 bpm. Triggered event->sinus rhythm & artifact.  . Stroke ([place])   . Tobacco abuse     Patient Active Problem List   Diagnosis  Date Noted  . PSVT (paroxysmal supraventricular tachycardia) ([place]) 05/12/2019  . Familial hypercholesterolemia 03/02/2019  . Unstable angina ([place]) 02/22/2019  . CAD in native artery 02/22/2019  . Shortness of breath 09/17/2018  . Palpitations 09/17/2018  . Right lower lobe pneumonia 06/11/2018  . Generalized anxiety disorder 10/10/2015  . COPD exacerbation ([place]) 09/22/2015  . Essential hypertension 09/22/2015  . History of stroke 09/22/2015  . Leukocytosis 09/22/2015  . Acute respiratory failure ([place])   . Hyperlipidemia LDL goal <70   . Acute exacerbation of chronic obstructive pulmonary disease (COPD) ([place]) 09/04/2015  . Acute bronchitis 04/04/2015  . Hemoptysis 06/30/2014  . Thrush 07/22/2013  . Chest pain 04/27/2013  . GERD (gastroesophageal reflux disease) 06/18/2012  . Chronic cough 06/18/2012  . EMPHYSEMA 05/27/2007  . Tobacco abuse 12/11/2006    Past Surgical History:  Procedure Laterality Date  . APPENDECTOMY     microscopic   . BRONCHOSCOPY  March 2008, Feb 2012  . CARDIAC CATHETERIZATION    . CESAREAN SECTION    . CORONARY STENT INTERVENTION N/A 02/22/2019   Procedure: CORONARY STENT INTERVENTION;  Surgeon: [name], MD;  Location: [place] [facility];  Service: Cardiovascular;  Laterality: N/A;  . INTRAVASCULAR PRESSURE WIRE/FFR STUDY N/A 02/22/2019   Procedure: INTRAVASCULAR PRESSURE WIRE/FFR STUDY;  Surgeon: [name], MD;  Location: [place] [facility];  Service: Cardiovascular;  Laterality: N/A;  . RIGHT/LEFT HEART CATH AND CORONARY ANGIOGRAPHY N/A 02/22/2019   Procedure: RIGHT/LEFT HEART CATH AND CORONARY ANGIOGRAPHY;  Surgeon: [name], MD;  Location: [place] [facility];  Service: Cardiovascular;  Laterality: N/A;    Prior to Admission medications   Medication Sig Start Date End Date Taking? Authorizing Provider  HYDROcodone-acetaminophen (NORCO/VICODIN) 5-325 MG tablet Take 1 tablet by mouth every 6 (six) hours as needed for  moderate pain. 03/10/20  Yes Johnn Hai, PA-C  albuterol (PROAIR HFA) 108 (90 Base) MCG/ACT inhaler Inhale 2 puffs into the lungs every 4 (four) hours as needed for wheezing or shortness of breath. 09/29/19   Chesley Mires, MD  albuterol (PROVENTIL) (2.5 MG/3ML) 0.083% nebulizer solution INHALE 1 VIAL IN NEBULIZER 4 TIMES A DAY AS NEEDED FOR WHEEZING/SHORTNESS OF BREATH 02/07/20   Chesley Mires, MD  amLODipine (NORVASC) 2.5 MG tablet Take 1 tablet (2.5 mg total) by mouth daily. 03/08/20   Theora Gianotti, NP  aspirin EC 81 MG tablet Take 81 mg by mouth daily.    [provider]  benzonatate (TESSALON) 200 MG capsule Take 200 mg by mouth 3 (three) times daily as needed for cough.    [provider]  budesonide (PULMICORT) 0.25 MG/2ML nebulizer solution TAKE 2 MLS (0.25 MG TOTAL) BY NEBULIZATION 2 (TWO) TIMES DAILY. 12/07/19   Chesley Mires, MD  butalbital-acetaminophen-caffeine (FIORICET, ESGIC) [phone] MG tablet Take 1 tablet by mouth 3 (three) times daily as needed for headache. 11/19/17   [provider]  diltiazem (CARDIZEM CD) 120 MG 24 hr capsule TAKE 1 CAPSULE BY MOUTH EVERY DAY 02/17/20   End, Harrell Gave, MD  famotidine (PEPCID) 40 MG tablet Take 40 mg by mouth at bedtime.    [provider]  gabapentin (NEURONTIN) 300 MG capsule Take 300 mg by mouth at bedtime. 11/14/17   [provider]  ipratropium (ATROVENT) 0.02 % nebulizer solution Take 0.5 mg by nebulization 4 (four) times daily.    [provider]  magic mouthwash w/lidocaine SOLN Take 10 mLs by mouth 4 (four) times daily as needed for mouth pain. Patient taking differently: Take 10 mLs by mouth 2 (two) times daily.  09/10/18   Triplett, Johnette Abraham B, FNP  metoprolol tartrate (LOPRESSOR) 25 MG tablet Take one and a half tablets by mouth twice a day. 07/25/19   End, Harrell Gave, MD  montelukast (SINGULAIR) 10 MG tablet Take 10 mg by mouth daily.    [provider]   nitroGLYCERIN (NITROSTAT) 0.4 MG SL tablet PLACE 1 TABLET (0.4 MG TOTAL) UNDER THE TONGUE EVERY 5 (FIVE) MINUTES AS NEEDED FOR CHEST PAIN. MAXIMUM OF 3 DOSES. 08/04/19   End, Harrell Gave, MD  Omega-3 Fatty Acids (FISH OIL PO) Take 1 capsule by mouth 2 (two) times daily.    [provider]  omeprazole (PRILOSEC) 40 MG capsule TAKE 1 CAPSULE BY MOUTH EVERY DAY 05/12/19   Chesley Mires, MD  PAZEO 0.7 % SOLN Place 1 drop into both eyes daily. 11/26/17   [provider]  promethazine (PHENERGAN) 25 MG tablet Take 25 mg by mouth 4 (four) times daily as needed for nausea or vomiting.  11/09/17   [provider]  promethazine-codeine (PHENERGAN WITH CODEINE) 6.25-10 MG/5ML syrup Take 5 mLs by mouth at bedtime as needed for cough. 11/30/19   Chesley Mires, MD  rosuvastatin (CRESTOR) 40 MG tablet TAKE 1 TABLET BY MOUTH EVERY DAY 01/05/20   Hilty, Nadean Corwin, MD  ticagrelor [facility]) 90  MG TABS tablet Take 1 tablet (90 mg total) by mouth 2 (two) times daily. 09/13/19   Theora Gianotti, NP    Allergies Penicillins, Omnipaque [iohexol], and Red dye  Family History  Problem Relation Age of Onset  . Emphysema Father   . Prostate cancer Father   . Liver disease Father   . Emphysema Maternal Grandmother   . Diabetes Mother   . Heart disease Mother 94       CABG and valve replacement  . Colon cancer Neg Hx     Social History Social History   Tobacco Use  . Smoking status: Current Every Day Smoker    Packs/day: 0.20    Years: 43.00    Pack years: 8.60    Types: Cigarettes  . Smokeless tobacco: Never Used  . Tobacco comment: 3 per day  Vaping Use  . Vaping Use: Never used  Substance Use Topics  . Alcohol use: No    Alcohol/week: 0.0 standard drinks  . Drug use: No    Review of Systems  Constitutional: No fever/chills Eyes: No visual changes. Cardiovascular: Denies chest pain. Respiratory: Denies shortness of breath. Gastrointestinal: No abdominal pain.  No  nausea, no vomiting.   Musculoskeletal: Positive for left hand pain. Skin: Positive for bruising. Neurological: Negative for headaches, focal weakness or numbness.  ____________________________________________   PHYSICAL EXAM:  VITAL SIGNS: ED Triage Vitals  Enc Vitals Group     BP 03/10/20 0735 (!) 157/82     Pulse Rate 03/10/20 0735 93     Resp 03/10/20 0735 16     Temp 03/10/20 0735 98.2 F (36.8 C)     Temp Source 03/10/20 0735 Oral     SpO2 03/10/20 0735 100 %     Weight 03/10/20 0736 124 lb (56.2 kg)     Height 03/10/20 0736 5\' 3"  (1.6 m)     Head Circumference --      Peak Flow --      Pain Score 03/10/20 0736 10     Pain Loc --      Pain Edu? --      Excl. in [place]? --     Constitutional: Alert and oriented. Well appearing and in no acute distress. Eyes: Conjunctivae are normal.  Head: Atraumatic. Nose: No trauma. Neck: No stridor.   Cardiovascular: Normal rate, regular rhythm. Grossly normal heart sounds.  Good peripheral circulation. Respiratory: Normal respiratory effort.  No retractions. Lungs CTAB. Musculoskeletal: On examination of left hand dorsal aspect there is moderate amount of ecchymosis and marked tenderness on palpation of the third fourth and fifth metacarpals.  No gross deformity was noted.  Skin is intact.   Neurologic:  Normal speech and language. No gross focal neurologic deficits are appreciated. No gait instability. Skin:  Skin is warm, dry and intact. No rash noted. Psychiatric: Mood and affect are normal. Speech and behavior are normal.  ____________________________________________   LABS (all labs ordered are listed, but only abnormal results are displayed)  Labs Reviewed - No data to display ____________________________________________   RADIOLOGY I, Johnn Hai, personally viewed and evaluated these images (plain radiographs) as part of my medical decision making, as well as reviewing the written report by the  radiologist.   Official radiology report(s): DG Hand Complete Left  Result Date: 03/10/2020 CLINICAL DATA:  Left hand pain after injury. EXAM: LEFT HAND - COMPLETE 3+ VIEW COMPARISON:  January 18, 2018. FINDINGS: There is no evidence of fracture or dislocation. There is  no evidence of arthropathy or other focal bone abnormality. Soft tissues are unremarkable. IMPRESSION: Negative. Electronically Signed   By: Marijo Conception M.D.   On: 03/10/2020 08:23    ____________________________________________   PROCEDURES  Procedure(s) performed (including Critical Care):  Procedures Jones wrap was applied by provider.  ____________________________________________   INITIAL IMPRESSION / ASSESSMENT AND PLAN / ED COURSE  As part of my medical decision making, I reviewed the following data within the electronic MEDICAL RECORD NUMBER Notes from prior ED visits and [place] Controlled Substance Database  55 year old female presents to the ED after mechanical fall earlier this morning with an injury to her left hand.  Patient denies head injury or LOC.  On physical exam the dorsal aspect of her left hand is ecchymotic with soft tissue edema.  X-rays were negative for acute bony changes.  Patient was placed on a Jones wrap and a prescription for hydrocodone was sent to her pharmacy since she is driving.  Patient is encouraged to ice and elevate.  She will follow-up with her PCP if any continued problems.  ____________________________________________   FINAL CLINICAL IMPRESSION(S) / ED DIAGNOSES  Final diagnoses:  Contusion of left hand, initial encounter     ED Discharge Orders         Ordered    HYDROcodone-acetaminophen (NORCO/VICODIN) 5-325 MG tablet  Every 6 hours PRN        03/10/20 0913          *Please note:  AYRAH ERDOS was evaluated in Emergency Department on 03/10/2020 for the symptoms described in the history of present illness. She was evaluated in the context of the global COVID-19  pandemic, which necessitated consideration that the patient might be at risk for infection with the SARS-CoV-2 virus that causes COVID-19. Institutional protocols and algorithms that pertain to the evaluation of patients at risk for COVID-19 are in a state of rapid change based on information released by regulatory bodies including the CDC and federal and state organizations. These policies and algorithms were followed during the patient's care in the ED.  Some ED evaluations and interventions may be delayed as a result of limited staffing during and the pandemic.*   Note:  This document was prepared using Dragon voice recognition software and may include unintentional dictation errors.    Johnn Hai, PA-C 03/10/20 1133    Naaman Plummer, MD 03/10/20 [phone]

## 2020-03-19 ENCOUNTER — Other Ambulatory Visit: Payer: Self-pay

## 2020-03-19 ENCOUNTER — Telehealth: Payer: Self-pay | Admitting: Internal Medicine

## 2020-03-19 MED ORDER — TICAGRELOR 90 MG PO TABS: 90.0000 mg | ORAL_TABLET | Freq: Two times a day (BID) | ORAL | 0 refills | Status: DC

## 2020-03-19 NOTE — Telephone Encounter (Signed)
   [facility] Pre-operative Risk Assessment    HEARTCARE STAFF: - Please ensure there is not already an duplicate clearance open for this procedure. - Under Visit Info/Reason for Call, type in Other and utilize the format Clearance MM/DD/YY or Clearance TBD. Do not use dashes or single digits. - If request is for dental extraction, please clarify the # of teeth to be extracted.  Request for surgical clearance:  1. What type of surgery is being performed? Dental cleaning  2. When is this surgery scheduled? TBD  3. What type of clearance is required (medical clearance vs. Pharmacy clearance to hold med vs. Both)? Not noted  4. Are there any medications that need to be held prior to surgery and how long? Not noted  5. Practice name and name of physician performing surgery? [facility]  6. What is the office phone number? [phone]   7.   What is the office fax number? [phone]  8.   Anesthesia type (None, local, MAC, general) ? Local, possible hydrocodone    Marykay Lex 03/19/2020, 2:49 PM  _________________________________________________________________   (provider comments below)

## 2020-03-20 NOTE — Telephone Encounter (Signed)
   Primary Cardiologist: [name], MD  Chart reviewed as part of pre-operative protocol coverage.   Simple dental extractions are considered low risk procedures per guidelines and generally do not require any specific cardiac clearance. It is also generally accepted that for simple extractions and dental cleanings, there is no need to interrupt any blood thinner therapy.  SBE prophylaxis is not required for the patient from a cardiac standpoint.  Please call with questions. Richardson Dopp, PA-C 03/20/2020, 2:11 PM

## 2020-03-20 NOTE — Telephone Encounter (Signed)
Notes faxed to surgeon. This phone note will be removed from the preop pool. Richardson Dopp, PA-C  03/20/2020 2:15 PM

## 2020-03-29 ENCOUNTER — Telehealth: Payer: Self-pay | Admitting: Internal Medicine

## 2020-03-29 NOTE — Telephone Encounter (Signed)
Clearance has been manually faxed to [facility].

## 2020-03-29 NOTE — Telephone Encounter (Signed)
Please resend the previous clearance letter and contact the requesting provider to make sure they have received it. This is a duplicate

## 2020-03-29 NOTE — Telephone Encounter (Signed)
   [facility] Pre-operative Risk Assessment    HEARTCARE STAFF: - Please ensure there is not already an duplicate clearance open for this procedure. - Under Visit Info/Reason for Call, type in Other and utilize the format Clearance MM/DD/YY or Clearance TBD. Do not use dashes or single digits. - If request is for dental extraction, please clarify the # of teeth to be extracted.  Request for surgical clearance:  1. What type of surgery is being performed? Dental cleaning, fillings  2. When is this surgery scheduled? TBD  3. What type of clearance is required (medical clearance vs. Pharmacy clearance to hold med vs. Both)? Not noted  4. Are there any medications that need to be held prior to surgery and how long? Not noted  5. Practice name and name of physician performing surgery? [facility] Marcello Moores  6. What is the office phone number? [phone]   7.   What is the office fax number? [phone]  8.   Anesthesia type (None, local, MAC, general) ? Local if needed   Marykay Lex 03/29/2020, 2:51 PM  _________________________________________________________________   (provider comments below)

## 2020-03-29 NOTE — Telephone Encounter (Signed)
Courtney with [facility] is following up to confirm that their office received our fax.

## 2020-03-29 NOTE — Telephone Encounter (Signed)
I left message for Dr. Manon Hilding office we received a duplicate clearance request. Left message our office has re-faxed clearance from 03/20/20 as well as the duplicate request. Left message to please call our office tomorrow if they have not received faxes.

## 2020-03-31 ENCOUNTER — Other Ambulatory Visit: Payer: Self-pay | Admitting: Pulmonary Disease

## 2020-03-31 DIAGNOSIS — J441 Chronic obstructive pulmonary disease with (acute) exacerbation: Secondary | ICD-10-CM

## 2020-03-31 DIAGNOSIS — J432 Centrilobular emphysema: Secondary | ICD-10-CM

## 2020-04-02 ENCOUNTER — Other Ambulatory Visit: Payer: Self-pay

## 2020-04-02 MED ORDER — AMLODIPINE BESYLATE 2.5 MG PO TABS: 2.5000 mg | ORAL_TABLET | Freq: Every day | ORAL | 0 refills | Status: DC

## 2020-04-06 ENCOUNTER — Telehealth: Payer: Self-pay | Admitting: Pulmonary Disease

## 2020-04-06 NOTE — Telephone Encounter (Signed)
I have called and LM on VM for the pt to call our office.  She will need appt with APP or VS to discuss refill. thanks

## 2020-04-06 NOTE — Telephone Encounter (Signed)
Patient scheduled 04/09/2020 Mon at 10:00  Am w/ Geraldo Pitter NP.

## 2020-04-06 NOTE — Telephone Encounter (Signed)
She is due for a follow up visit soon.  Please schedule ROV with me or NP and then will determine if she should get refill.

## 2020-04-06 NOTE — Telephone Encounter (Signed)
11/30/2019 the phenergan with codeine was last filled for 216ml.  Pt is requesting that this be sent in for a refill again.  There were no other refills given.    Pt was last seen on 11/30/2019 Next ov is :  No pending appts  VS please advise if ok to refill

## 2020-04-09 ENCOUNTER — Other Ambulatory Visit: Payer: Self-pay

## 2020-04-09 ENCOUNTER — Ambulatory Visit (INDEPENDENT_AMBULATORY_CARE_PROVIDER_SITE_OTHER): Payer: Medicare Other | Admitting: Primary Care

## 2020-04-09 ENCOUNTER — Encounter: Payer: Self-pay | Admitting: Primary Care

## 2020-04-09 VITALS — BP 122/72 | HR 74 | Temp 98.0°F | Ht 63.0 in | Wt 123.4 lb

## 2020-04-09 DIAGNOSIS — J410 Simple chronic bronchitis: Secondary | ICD-10-CM

## 2020-04-09 DIAGNOSIS — Z720 Tobacco use: Secondary | ICD-10-CM | POA: Diagnosis not present

## 2020-04-09 MED ORDER — PROMETHAZINE-CODEINE 6.25-10 MG/5ML PO SYRP: 5.0000 mL | ORAL_SOLUTION | Freq: Every evening | ORAL | 0 refills | Status: DC | PRN

## 2020-04-09 NOTE — Assessment & Plan Note (Addendum)
-   Increased dyspnea x 2 weeks, does not appear acute exacerbated today. Lungs were clear without overt wheezing. Cough appears chronic related to ongoing tobacco use. Strongly encourage smoking cessation. Continue Budesonide nebulizer BID and Ipratropium-albuterol nebulizer QID. Refill promethazine-codeine cough syrup to use at night only sparingly. PMP reviewed, last filled in October 2021. Patient to notify us if she develops purulent mucus or worsening dyspnea/wheezing. FU in 6 months with Dr. Halford Chessman.

## 2020-04-09 NOTE — Patient Instructions (Addendum)
Nice seeing you Kimberly Kemp  Continue Pulmicort nebulizer twice a day Continue Duoneb four times a day scheduled Continue to taper amount you are smoking and pick quit date You may benefit from lung cancer screening program at age 55 (need to have 30 pack year hx) Call if you develop purulent cough or worsening wheezing   Rx: Refill cough syrup   Follow-up 6 months with Dr. Halford Chessman    Steps to Quit Smoking Smoking tobacco is the leading cause of preventable death. It can affect almost every organ in the body. Smoking puts you and people around you at risk for many serious, long-lasting (chronic) diseases. Quitting smoking can be hard, but it is one of the best things that you can do for your health. It is never too late to quit. How do I get ready to quit? When you decide to quit smoking, make a plan to help you succeed. Before you quit:  Pick a date to quit. Set a date within the next 2 weeks to give you time to prepare.  Write down the reasons why you are quitting. Keep this list in places where you will see it often.  Tell your family, friends, and co-workers that you are quitting. Their support is important.  Talk with your doctor about the choices that may help you quit.  Find out if your health insurance will pay for these treatments.  Know the people, places, things, and activities that make you want to smoke (triggers). Avoid them. What first steps can I take to quit smoking?  Throw away all cigarettes at home, at work, and in your car.  Throw away the things that you use when you smoke, such as ashtrays and lighters.  Clean your car. Make sure to empty the ashtray.  Clean your home, including curtains and carpets. What can I do to help me quit smoking? Talk with your doctor about taking medicines and seeing a counselor at the same time. You are more likely to succeed when you do both.  If you are pregnant or breastfeeding, talk with your doctor about counseling or other  ways to quit smoking. Do not take medicine to help you quit smoking unless your doctor tells you to do so. To quit smoking: Quit right away  Quit smoking totally, instead of slowly cutting back on how much you smoke over a period of time.  Go to counseling. You are more likely to quit if you go to counseling sessions regularly. Take medicine You may take medicines to help you quit. Some medicines need a prescription, and some you can buy over-the-counter. Some medicines may contain a drug called nicotine to replace the nicotine in cigarettes. Medicines may:  Help you to stop having the desire to smoke (cravings).  Help to stop the problems that come when you stop smoking (withdrawal symptoms). Your doctor may ask you to use:  Nicotine patches, gum, or lozenges.  Nicotine inhalers or sprays.  Non-nicotine medicine that is taken by mouth. Find resources Find resources and other ways to help you quit smoking and remain smoke-free after you quit. These resources are most helpful when you use them often. They include:  Online chats with a Social worker.  Phone quitlines.  Printed Furniture conservator/restorer.  Support groups or group counseling.  Text messaging programs.  Mobile phone apps. Use apps on your mobile phone or tablet that can help you stick to your quit plan. There are many free apps for mobile phones and tablets as  well as websites. Examples include Quit Guide from the [employer] and smokefree.gov   What things can I do to make it easier to quit?  Talk to your family and friends. Ask them to support and encourage you.  Call a phone quitline (1-800-QUIT-NOW), reach out to support groups, or work with a Social worker.  Ask people who smoke to not smoke around you.  Avoid places that make you want to smoke, such as: ? Bars. ? Parties. ? Smoke-break areas at work.  Spend time with people who do not smoke.  Lower the stress in your life. Stress can make you want to smoke. Try these  things to help your stress: ? Getting regular exercise. ? Doing deep-breathing exercises. ? Doing yoga. ? Meditating. ? Doing a body scan. To do this, close your eyes, focus on one area of your body at a time from head to toe. Notice which parts of your body are tense. Try to relax the muscles in those areas.   How will I feel when I quit smoking? Day 1 to 3 weeks Within the first 24 hours, you may start to have some problems that come from quitting tobacco. These problems are very bad 2-3 days after you quit, but they do not often last for more than 2-3 weeks. You may get these symptoms:  Mood swings.  Feeling restless, nervous, angry, or annoyed.  Trouble concentrating.  Dizziness.  Strong desire for high-sugar foods and nicotine.  Weight gain.  Trouble pooping (constipation).  Feeling like you may vomit (nausea).  Coughing or a sore throat.  Changes in how the medicines that you take for other issues work in your body.  Depression.  Trouble sleeping (insomnia). Week 3 and afterward After the first 2-3 weeks of quitting, you may start to notice more positive results, such as:  Better sense of smell and taste.  Less coughing and sore throat.  Slower heart rate.  Lower blood pressure.  Clearer skin.  Better breathing.  Fewer sick days. Quitting smoking can be hard. Do not give up if you fail the first time. Some people need to try a few times before they succeed. Do your best to stick to your quit plan, and talk with your doctor if you have any questions or concerns. Summary  Smoking tobacco is the leading cause of preventable death. Quitting smoking can be hard, but it is one of the best things that you can do for your health.  When you decide to quit smoking, make a plan to help you succeed.  Quit smoking right away, not slowly over a period of time.  When you start quitting, seek help from your doctor, family, or friends. This information is not intended  to replace advice given to you by your health care provider. Make sure you discuss any questions you have with your health care provider. Document Revised: 10/15/2018 Document Reviewed: 04/10/2018 Elsevier Patient Education  [place] .

## 2020-04-09 NOTE — Progress Notes (Signed)
Reviewed and agree with assessment/plan.   Chesley Mires, MD [facility]monary/Critical [facility] 04/09/2020, 1:56 PM Pager:  [phone]

## 2020-04-09 NOTE — Assessment & Plan Note (Signed)
-   Current smoker, 4-5 cigarettes a day. Started smoking age 55 x 0.25-0.5 packs per day - Referring to lung cancer screening as age-pack year guidelines have recently changed.

## 2020-04-09 NOTE — Progress Notes (Signed)
**Note Kimberly-Identified via Obfuscation** @Patient  ID: Kemp Kemp, female    DOB: [birth year], 55 y.o.   MRN: [id number]  Chief Complaint  Patient presents with  . Follow-up    Referring provider: Martin Kemp, *  HPI: 55 year old female, current everyday smoker. PMH significant HTN, CAD, COPD, emphysema, GERD, chronic cough, generalized anxiety disorder, hyperlipidemia. Patient of Dr. Halford Kemp, last seen in October 2021.   04/09/2020 Patient presents today for regular COPD follow-up. Past several weeks she has noticed more dyspnea. She has a moderate-severe dry hacking cough with intermittent chest tightness and wheezing with activity. She is consistent with Pulmicort twice a day and Duonebs 4 times a day. She uses cough medication sparingly at night only. Continues to smoke 4-5 cigarettes a day. She has been smoking since the age of 6 0.25-0.5 packs per day. Discussed referral to lung cancer screening program. She received pneumococcal 23 vaccine in 2005 and prevnar 13 in 2019. She reports chest tightness with vaccinations and has opted on deferring further vaccinations.    Pulmonary testing:  A1AT 09/04/06 >> 236  Spirometry 10/26/09>>FEV1 2.59(88%), FEV1% 73  RAST 01/22/10 >> negative, IgE 11.9  Labs 01/22/10 >> ACE 46, RF < 20, ANA negative, ANCA negative  PFT 07/14/18 >> FEV1 2.08 (77%), FEV1% 65, TLC 4.11 (83%), DLCO 70%  Imaging:  CT chest 06/26/10 >> upper lobe emphysema  CT chest 06/10/13 >> minimal centrilobular emphysema  CT chest 07/13/18 >>moderate to advanced emphysema  Sleep study:  HST08/10/12 >> AHI 1, SpO2 low 91%  Allergies  Allergen Reactions  . Penicillins Hives    Did it involve swelling of the face/tongue/throat, SOB, or low BP? No Did it involve sudden or severe rash/hives, skin peeling, or any reaction on the inside of your mouth or nose? Yes Did you need to seek medical attention at a hospital or doctor's office? Yes When did it last happen?10 Years If all above answers are  "NO", may proceed with cephalosporin use.   . Omnipaque [Iohexol] Itching       . Red Dye Itching    Immunization History  Administered Date(s) Administered  . Influenza Split 11/05/2011  . Influenza,inj,Quad PF,6+ Mos 11/03/2012  . Pneumococcal Conjugate-13 10/06/2017  . Pneumococcal Polysaccharide-23 11/04/2003  . Tdap 01/18/2018    Past Medical History:  Diagnosis Date  . ? h/o Paroxysmal atrial fibrillation (Kimberly Kemp)   . ? h/o Pulmonary embolism (Kemp Kemp)   . Anxiety   . Asthma   . Atypical chest pain    a. 09/2018 ETT: poor exercise tolerance; b. 11/2018 Dobutamine Echo: No wall motion abnormalities, EF 55-60%.  Marland Kitchen CAD (coronary artery disease)    a. 02/2019 Cath/PCI: LM nl, LAD nl, LCX nl, RCA 59m (attempted iFR->wire dissection-->3.0x38 Resolute Onyx DES prox, 2.75x38 Resolute Onyx DES mid), RPDA jailed by stent. EF 65%; b. 11/2019 MV: EF 55-65%. medium-sized apical defect - likely diaph atten. No ischemia-->low risk.  . Chronic pain   . Cirrhosis (Kimberly Kemp)   . Colon cancer (Kemp Kemp)   . COPD (chronic obstructive pulmonary disease) (Kemp Kemp)   . COVID-19 virus infection 11/2019  . Depression   . Emphysema   . GERD (gastroesophageal reflux disease)   . Heart murmur   . Hepatitis B   . History of echocardiogram    a. 02/2019 Echo: EF 55-60%. No rwma. No significant valvular disease.  Marland Kitchen Hyperlipidemia   . Hypertension   . Palpitations    a. 02/2019 Zio: Sinus, avg 83 (55-135). Rare PACs/PVCs. Four atrial  runs, longest 7 beats, fastest 146 bpm. Triggered event->sinus rhythm & artifact.  . Stroke (Kimberly Kemp)   . Tobacco abuse     Tobacco History: Social History   Tobacco Use  Smoking Status Current Every Day Smoker  . Packs/day: 0.20  . Years: 43.00  . Pack years: 8.60  . Types: Cigarettes  Smokeless Tobacco Never Used  Tobacco Comment   3 per day   Ready to quit: Not Answered Counseling given: Not Answered Comment: 3 per day   Outpatient Medications Prior to Visit  Medication  Sig Dispense Refill  . albuterol (PROAIR HFA) 108 (90 Base) MCG/ACT inhaler Inhale 2 puffs into the lungs every 4 (four) hours as needed for wheezing or shortness of breath. 8.5 g 1  . albuterol (PROVENTIL) (2.5 MG/3ML) 0.083% nebulizer solution INHALE CONTENTS OF 1 VIAL VIA NEBULIZER 4 TIMES DAILY AS NEEDED FOR WHEEZE/SHORTNESS OF BREATH 375 mL 1  . albuterol (PROVENTIL) (2.5 MG/3ML) 0.083% nebulizer solution TAKE 3 MLS (2.5 MG TOTAL) BY NEBULIZATION 4 TIMES DAILY AS NEEDED FOR WHEEZING SHORTNESS OF BREATH 375 mL 3  . amLODipine (NORVASC) 2.5 MG tablet Take 1 tablet (2.5 mg total) by mouth daily. 30 tablet 0  . aspirin EC 81 MG tablet Take 81 mg by mouth daily.    . benzonatate (TESSALON) 200 MG capsule Take 200 mg by mouth 3 (three) times daily as needed for cough.    . budesonide (PULMICORT) 0.25 MG/2ML nebulizer solution TAKE 2 MLS (0.25 MG TOTAL) BY NEBULIZATION 2 (TWO) TIMES DAILY. 360 mL 2  . butalbital-acetaminophen-caffeine (FIORICET, ESGIC) 50-325-40 MG tablet Take 1 tablet by mouth 3 (three) times daily as needed for headache.  2  . diltiazem (CARDIZEM CD) 120 MG 24 hr capsule TAKE 1 CAPSULE BY MOUTH EVERY DAY 90 capsule 3  . famotidine (PEPCID) 40 MG tablet Take 40 mg by mouth at bedtime.    . gabapentin (NEURONTIN) 300 MG capsule Take 300 mg by mouth at bedtime.  1  . HYDROcodone-acetaminophen (NORCO/VICODIN) 5-325 MG tablet Take 1 tablet by mouth every 6 (six) hours as needed for moderate pain. 15 tablet 0  . ipratropium (ATROVENT) 0.02 % nebulizer solution Take 0.5 mg by nebulization 4 (four) times daily.    . magic mouthwash w/lidocaine SOLN Take 10 mLs by mouth 4 (four) times daily as needed for mouth pain. (Patient taking differently: Take 10 mLs by mouth 2 (two) times daily.) 120 mL 0  . metoprolol tartrate (LOPRESSOR) 25 MG tablet Take one and a half tablets by mouth twice a day. 135 tablet 3  . montelukast (SINGULAIR) 10 MG tablet Take 10 mg by mouth daily.    . nitroGLYCERIN  (NITROSTAT) 0.4 MG SL tablet PLACE 1 TABLET (0.4 MG TOTAL) UNDER THE TONGUE EVERY 5 (FIVE) MINUTES AS NEEDED FOR CHEST PAIN. MAXIMUM OF 3 DOSES. 25 tablet 0  . Omega-3 Fatty Acids (FISH OIL PO) Take 1 capsule by mouth 2 (two) times daily.    Marland Kitchen omeprazole (PRILOSEC) 40 MG capsule TAKE 1 CAPSULE BY MOUTH EVERY DAY 30 capsule 2  . PAZEO 0.7 % SOLN Place 1 drop into both eyes daily.    . promethazine (PHENERGAN) 25 MG tablet Take 25 mg by mouth 4 (four) times daily as needed for nausea or vomiting.   2  . rosuvastatin (CRESTOR) 40 MG tablet TAKE 1 TABLET BY MOUTH EVERY DAY 90 tablet 3  . ticagrelor (BRILINTA) 90 MG TABS tablet Take 1 tablet (90 mg total) by mouth 2 (  two) times daily. 60 tablet 0  . promethazine-codeine (PHENERGAN WITH CODEINE) 6.25-10 MG/5ML syrup Take 5 mLs by mouth at bedtime as needed for cough. 240 mL 0   No facility-administered medications prior to visit.   Review of Systems  Review of Systems  Constitutional: Negative.   Respiratory: Positive for cough and shortness of breath.   Cardiovascular: Negative.    Physical Exam  BP 122/72 (BP Location: Left Arm, Cuff Size: Normal)   Pulse 74   Temp 98 F (36.7 C) (Oral)   Ht 5\' 3"  (1.6 m)   Wt 123 lb 6.4 oz (56 kg)   LMP 07/18/2016   SpO2 99%   BMI 21.86 kg/m  Physical Exam Constitutional:      Appearance: Normal appearance.  HENT:     Head: Normocephalic and atraumatic.     Mouth/Throat:     Comments: Deferred d/t masking  Cardiovascular:     Rate and Rhythm: Normal rate and regular rhythm.  Pulmonary:     Effort: Pulmonary effort is normal.     Breath sounds: Normal breath sounds. No wheezing, rhonchi or rales.  Musculoskeletal:        General: Normal range of motion.     Cervical back: Normal range of motion and neck supple.  Lymphadenopathy:     Cervical: No cervical adenopathy.  Skin:    General: Skin is warm and dry.  Neurological:     General: No focal deficit present.     Mental Status: She is  alert and oriented to person, place, and time. Mental status is at baseline.  Psychiatric:        Mood and Affect: Mood normal.        Behavior: Behavior normal.        Thought Content: Thought content normal.        Judgment: Judgment normal.      Lab Results:  CBC    Component Value Date/Time   WBC 11.6 (H) 04/13/2019 1022   RBC 4.31 04/13/2019 1022   HGB 13.4 04/13/2019 1022   HCT 39.7 04/13/2019 1022   PLT 266 04/13/2019 1022   MCV 92.1 04/13/2019 1022   MCH 31.1 04/13/2019 1022   MCHC 33.8 04/13/2019 1022   RDW 13.6 04/13/2019 1022   LYMPHSABS 3.9 02/21/2019 1658   MONOABS 0.8 02/21/2019 1658   EOSABS 0.3 02/21/2019 1658   BASOSABS 0.1 02/21/2019 1658    BMET    Component Value Date/Time   NA 140 04/13/2019 1022   K 4.0 04/13/2019 1022   CL 105 04/13/2019 1022   CO2 25 04/13/2019 1022   GLUCOSE 112 (H) 04/13/2019 1022   BUN 17 04/13/2019 1022   CREATININE 0.90 04/13/2019 1022   CALCIUM 10.0 04/13/2019 1022   GFRNONAA >60 04/13/2019 1022   GFRAA >60 04/13/2019 1022    BNP No results found for: BNP  ProBNP    Component Value Date/Time   PROBNP 58.2 01/14/2010 1052    Imaging: No results found.   Assessment & Plan:   COPD (chronic obstructive pulmonary disease) (Kimberly Kemp) - Increased dyspnea x 2 weeks, does not appear acute exacerbated today. Lungs were clear without overt wheezing. Cough appears chronic related to ongoing tobacco use. Strongly encourage smoking cessation. Continue Budesonide nebulizer BID and Ipratropium-albuterol nebulizer QID. Refill promethazine-codeine cough syrup to use at night only sparingly. PMP reviewed, last filled in October 2021. Patient to notify us if she develops purulent mucus or worsening dyspnea/wheezing. FU in 6 months with  Dr. Halford Kemp.   Tobacco abuse - Current smoker, 4-5 cigarettes a day. Started smoking age 81 x 0.25-0.5 packs per day - Referring to lung cancer screening as age-pack year guidelines have recently  changed.   Martyn Ehrich, NP 04/09/2020

## 2020-04-12 ENCOUNTER — Encounter: Payer: Self-pay | Admitting: *Deleted

## 2020-04-18 ENCOUNTER — Other Ambulatory Visit: Payer: Self-pay | Admitting: *Deleted

## 2020-04-18 DIAGNOSIS — Z87891 Personal history of nicotine dependence: Secondary | ICD-10-CM

## 2020-04-18 DIAGNOSIS — F17210 Nicotine dependence, cigarettes, uncomplicated: Secondary | ICD-10-CM

## 2020-04-19 ENCOUNTER — Other Ambulatory Visit: Payer: Self-pay | Admitting: Internal Medicine

## 2020-04-20 ENCOUNTER — Other Ambulatory Visit: Payer: Self-pay | Admitting: *Deleted

## 2020-04-20 MED ORDER — TICAGRELOR 90 MG PO TABS: 90.0000 mg | ORAL_TABLET | Freq: Two times a day (BID) | ORAL | 0 refills | Status: DC

## 2020-04-26 ENCOUNTER — Telehealth: Payer: Self-pay | Admitting: Internal Medicine

## 2020-04-26 NOTE — Telephone Encounter (Signed)
   [facility] Pre-operative Risk Assessment    HEARTCARE STAFF: - Please ensure there is not already an duplicate clearance open for this procedure. - Under Visit Info/Reason for Call, type in Other and utilize the format Clearance MM/DD/YY or Clearance TBD. Do not use dashes or single digits. - If request is for dental extraction, please clarify the # of teeth to be extracted.  Request for surgical clearance:  1. What type of surgery is being performed? Dental filling, crown  2. When is this surgery scheduled? TBD  3. What type of clearance is required (medical clearance vs. Pharmacy clearance to hold med vs. Both)? both  4. Are there any medications that need to be held prior to surgery and how long? Not noted  5. Practice name and name of physician performing surgery? [facility]   6. What is the office phone number? [phone]   7.   What is the office fax number? [phone]  8.   Anesthesia type (None, local, MAC, general) ? local   Marykay Lex 04/26/2020, 9:18 AM  _________________________________________________________________   (provider comments below)

## 2020-04-27 ENCOUNTER — Ambulatory Visit (INDEPENDENT_AMBULATORY_CARE_PROVIDER_SITE_OTHER): Payer: Medicare Other | Admitting: Internal Medicine

## 2020-04-27 ENCOUNTER — Encounter: Payer: Self-pay | Admitting: Internal Medicine

## 2020-04-27 ENCOUNTER — Other Ambulatory Visit: Payer: Self-pay

## 2020-04-27 VITALS — BP 100/64 | HR 70 | Ht 63.0 in | Wt 126.0 lb

## 2020-04-27 DIAGNOSIS — E782 Mixed hyperlipidemia: Secondary | ICD-10-CM

## 2020-04-27 DIAGNOSIS — I6523 Occlusion and stenosis of bilateral carotid arteries: Secondary | ICD-10-CM | POA: Diagnosis not present

## 2020-04-27 DIAGNOSIS — Z01810 Encounter for preprocedural cardiovascular examination: Secondary | ICD-10-CM

## 2020-04-27 DIAGNOSIS — I25118 Atherosclerotic heart disease of native coronary artery with other forms of angina pectoris: Secondary | ICD-10-CM

## 2020-04-27 DIAGNOSIS — R002 Palpitations: Secondary | ICD-10-CM

## 2020-04-27 MED ORDER — DILTIAZEM HCL ER COATED BEADS 180 MG PO CP24: 180.0000 mg | ORAL_CAPSULE | Freq: Every day | ORAL | 1 refills | Status: DC

## 2020-04-27 NOTE — Patient Instructions (Signed)
Medication Instructions:  Your physician has recommended you make the following change in your medication:  1- STOP Brilinta. 2- STOP Amlodipine. 3- INCREASE Diltiazem to 180 mg by mouth once a day.  *If you need a refill on your cardiac medications before your next appointment, please call your pharmacy*  Follow-Up: At [facility], you and your health needs are our priority.  As part of our continuing mission to provide you with exceptional heart care, we have created designated Provider Care Teams.  These Care Teams include your primary Cardiologist (physician) and Advanced Practice Providers (APPs -  Physician Assistants and Nurse Practitioners) who all work together to provide you with the care you need, when you need it.  We recommend signing up for the patient portal called "MyChart".  Sign up information is provided on this After Visit Summary.  MyChart is used to connect with patients for Virtual Visits (Telemedicine).  Patients are able to view lab/test results, encounter notes, upcoming appointments, etc.  Non-urgent messages can be sent to your provider as well.   To learn more about what you can do with MyChart, go to [email or web address].    Your next appointment:   3 month(s)  The format for your next appointment:   In Person  Provider:   You may see [name], MD or one of the following Advanced Practice Providers on your designated Care Team:    Murray Hodgkins, NP  Christell Faith, PA-C  Marrianne Mood, PA-C  Cadence [place], [place]  Laurann Montana, NP

## 2020-04-27 NOTE — Telephone Encounter (Signed)
   Primary Cardiologist: [name], MD  Chart reviewed as part of pre-operative protocol coverage. Given past medical history and time since last visit, based on ACC/AHA guidelines, Kimberly Kemp would be at acceptable risk for the planned procedure without further cardiovascular testing.  She is cleared to proceed with 1-2 teeth simple dental extraction, dental filling or crown.  She does not have a prior history of valve surgery or congenital heart issue, she does not need SBE prophylaxis prior to dental procedure.  I will route this recommendation to the requesting party via Epic fax function and remove from pre-op pool.  Please call with questions.  [place], [place] 04/27/2020, 3:30 PM

## 2020-04-27 NOTE — Progress Notes (Signed)
Follow-up Outpatient Visit Date: 04/27/2020  Primary Care Provider: Martin Majestic, FNP [place] [id number]  Chief Complaint: Palpitations  HPI:  Kimberly Kemp is a 55 y.o. female with history of coronary artery disease status post PCI to the RCAcomplicated by iatrogenic dissection of the RCA(02/22/2019), ischemic cardiomyopathy, hypertension, familial hyperlipidemia, stroke, questionable paroxysmal atrial fibrillation, questionable pulmonary embolism, COPD, colon cancer, hepatitis B, anxiety, and tobacco abuse, who presents for follow-up of coronary artery disease, hyperlipidemia, and stroke.  She was last seen in our office in 01/2020 for follow-up of chest pain.  Stress test in 11/2019 showed inferior scar without ischemia.  At her follow-up visit in December, she was feeling well without recurrent chest pain.  No medication changes or additional testing were pursued.  Today, Kimberly Kemp reports occasional palpitations, more noticeable than at her last visit.  They seem to happen randomly and are described as "skips and jumps."  At times, it also feels like her heart is racing for up to 2-3 minutes.  Episodes happen 2-3 times per week.  She notes mild chest pain associated with the palpitations.  She has not had any exertional chest pain nor shortness of breath, lightheadedness, or edema.  Home BP usually in the 120's/70's.  She is due for dental work next month; preop clearance and antiplatelet recommendations have been requested.  --------------------------------------------------------------------------------------------------  Cardiovascular History & Procedures: Cardiovascular Problems:  Coronary artery disease s/p PCI to RCA (02/2019)  Paroxysmal atrial fibrillation(questionable; no objective documentation in the patient's chart)  Pulmonary embolism(questionable; no objective documentation in the patient's chart)  Shortness of breath  Risk Factors:  Prior  stroke, hypertension, hyperlipidemia, and tobacco use  Cath/PCI:  RHC/LHC and PCI (02/22/2019): LMCA normal. LAD normal. LCx normal. Dominant RCA with 60 to 70% mid vessel stenosis. RA 9, RV 33/9, PA 26/13, PCWP 13, Fick CO/CI 7.8/4.9. LVEDP 15. Attempted IFR of mid RCA complicated by guide wire dissection necessitating stenting of the ostial through distal RCA with overlapping Resolute Onyx stents (2.75 x 38 and 3.0 x 38 mm). RCA was jailed with loss of flow.  CV Surgery:  None  EP Procedures and Devices:  14-day event monitor (01/05/2019):Predominantly sinus rhythm with rare PAC's and PVC's, as well as rare episodes of brief PSVT. Patient triggered events correspond to sinus rhythm.  Non-Invasive Evaluation(s):  Pharmacologic MPI (11/16/2019): Low risk study with fixed defect at the apex.  On personal review, the fixed defect involves the inferior wall as well.  No evidence of ischemia.  LVEF 55-65%.  Carotid Doppler (10/24/2019): Right ICA with 40-59% stenosis, left ICA with 1-39% stenosis.  Antegrade flow in both vertebral arteries.  Normal subclavian artery flow dynamics.  TTE (02/23/2019): Normal LV size and wall thickness. LVEF 55-60% with inferoseptal akinesis. Normal RV size and function. No significant valvular abnormality. Normal PA pressure.  Dobutamine stress echocardiogram (11/16/2018): Low risk study with normal baseline LVEF and no evidence of inducible wall motion abnormality. LVEF 55-60%.  Exercise tolerance test (09/20/2018): Low to intermediate risk study with decreased exercise capacity and significant motion artifact.   Recent CV Pertinent Labs: Lab Results  Component Value Date   CHOL 305 (H) 09/22/2015   HDL 83 09/22/2015   LDLCALC 174 (H) 09/22/2015   TRIG 241 (H) 09/22/2015   CHOLHDL 3.7 09/22/2015   K 4.0 04/13/2019   BUN 17 04/13/2019   CREATININE 0.90 04/13/2019    Past medical and surgical history were reviewed and updated in  EPIC.  Current Meds  Medication Sig   albuterol (PROAIR HFA) 108 (90 Base) MCG/ACT inhaler Inhale 2 puffs into the lungs every 4 (four) hours as needed for wheezing or shortness of breath.   albuterol (PROVENTIL) (2.5 MG/3ML) 0.083% nebulizer solution TAKE 3 MLS (2.5 MG TOTAL) BY NEBULIZATION 4 TIMES DAILY AS NEEDED FOR WHEEZING SHORTNESS OF BREATH   aspirin EC 81 MG tablet Take 81 mg by mouth daily.   budesonide (PULMICORT) 0.25 MG/2ML nebulizer solution TAKE 2 MLS (0.25 MG TOTAL) BY NEBULIZATION 2 (TWO) TIMES DAILY.   butalbital-acetaminophen-caffeine (FIORICET, ESGIC) 50-325-40 MG tablet Take 1 tablet by mouth 3 (three) times daily as needed for headache.   diltiazem (CARDIZEM CD) 180 MG 24 hr capsule Take 1 capsule (180 mg total) by mouth daily.   famotidine (PEPCID) 40 MG tablet Take 40 mg by mouth at bedtime.   gabapentin (NEURONTIN) 300 MG capsule Take 300 mg by mouth at bedtime.   HYDROcodone-acetaminophen (NORCO/VICODIN) 5-325 MG tablet Take 1 tablet by mouth every 6 (six) hours as needed for moderate pain.   ipratropium (ATROVENT) 0.02 % nebulizer solution Take 0.5 mg by nebulization 4 (four) times daily.   magic mouthwash w/lidocaine SOLN Take 10 mLs by mouth 4 (four) times daily as needed for mouth pain. (Patient taking differently: Take 10 mLs by mouth 2 (two) times daily.)   metoprolol tartrate (LOPRESSOR) 25 MG tablet TAKE 1.5 TABLETS (37.5 MG TOTAL) BY MOUTH 2 (TWO) TIMES DAILY.   montelukast (SINGULAIR) 10 MG tablet Take 10 mg by mouth daily.   nitroGLYCERIN (NITROSTAT) 0.4 MG SL tablet PLACE 1 TABLET (0.4 MG TOTAL) UNDER THE TONGUE EVERY 5 (FIVE) MINUTES AS NEEDED FOR CHEST PAIN. MAXIMUM OF 3 DOSES.   Omega-3 Fatty Acids (FISH OIL PO) Take 1 capsule by mouth 2 (two) times daily.   omeprazole (PRILOSEC) 40 MG capsule TAKE 1 CAPSULE BY MOUTH EVERY DAY   PAZEO 0.7 % SOLN Place 1 drop into both eyes daily.   promethazine-codeine (PHENERGAN WITH CODEINE) 6.25-10  MG/5ML syrup Take 5 mLs by mouth at bedtime as needed for cough.   rosuvastatin (CRESTOR) 40 MG tablet TAKE 1 TABLET BY MOUTH EVERY DAY   [DISCONTINUED] amLODipine (NORVASC) 2.5 MG tablet Take 1 tablet (2.5 mg total) by mouth daily.   [DISCONTINUED] diltiazem (CARDIZEM CD) 120 MG 24 hr capsule TAKE 1 CAPSULE BY MOUTH EVERY DAY   [DISCONTINUED] ticagrelor (BRILINTA) 90 MG TABS tablet Take 1 tablet (90 mg total) by mouth 2 (two) times daily.    Allergies: Penicillins, Omnipaque [iohexol], and Red dye  Social History   Tobacco Use   Smoking status: Current Every Day Smoker    Packs/day: 0.20    Years: 43.00    Pack years: 8.60    Types: Cigarettes   Smokeless tobacco: Never Used   Tobacco comment: 3 per day  Vaping Use   Vaping Use: Never used  Substance Use Topics   Alcohol use: No    Alcohol/week: 0.0 standard drinks   Drug use: No    Family History  Problem Relation Age of Onset   Emphysema Father    Prostate cancer Father    Liver disease Father    Emphysema Maternal Grandmother    Diabetes Mother    Heart disease Mother 86       CABG and valve replacement   Colon cancer Neg Hx     Review of Systems: A 12-system review of systems was performed and was negative except as noted in  the HPI.  --------------------------------------------------------------------------------------------------  Physical Exam: BP 100/64 (BP Location: Left Arm, Patient Position: Sitting, Cuff Size: Normal)    Pulse 70    Ht 5\' 3"  (1.6 m)    Wt 126 lb (57.2 kg)    LMP 07/18/2016    SpO2 99%    BMI 22.32 kg/m   General:  NAD. Neck: No JVD or HJR. Lungs: Clear to auscultation bilaterally without wheezes or crackles. Heart: Regular rate and rhythm without murmurs, rubs, or gallops. Abdomen: Soft, nontender, nondistended. Extremities: No lower extremity edema.  EKG:  NSR with inferior infarct.  No significant change since 11/07/2019.  Lab Results  Component Value Date    WBC 11.6 (H) 04/13/2019   HGB 13.4 04/13/2019   HCT 39.7 04/13/2019   MCV 92.1 04/13/2019   PLT 266 04/13/2019    Lab Results  Component Value Date   NA 140 04/13/2019   K 4.0 04/13/2019   CL 105 04/13/2019   CO2 25 04/13/2019   BUN 17 04/13/2019   CREATININE 0.90 04/13/2019   GLUCOSE 112 (H) 04/13/2019   ALT 25 05/06/2018    Lab Results  Component Value Date   CHOL 305 (H) 09/22/2015   HDL 83 09/22/2015   LDLCALC 174 (H) 09/22/2015   TRIG 241 (H) 09/22/2015   CHOLHDL 3.7 09/22/2015    --------------------------------------------------------------------------------------------------  ASSESSMENT AND PLAN: Coronary artery disease with stable angina: Minimal chest pain associated with palpitations; no exertional chest pain.  Ms. Saulter is >1 year out from her PCI to the RCA.  Given upcoming dental work, I think it would be reasonable to discontinue ticagrelor and continue indefinite aspirin 81 mg daily.  We will discontinue amlodipine and increase diltiazem to 180 mg daily.  Continue current dose of metoprolol as well as prn NTG for additional antianginal therapy.  Encourage tobacco cessation.  Palpitations: More frequent than at prior visits.  She is currently on amlodipine, diltiazem, and metoprolol.  We will stop amlodipine in favor or increasing diltiazem to 180 mg daily.  Metoprolol tartrate 37.5 mg BID will be continued.  Defer repeating event monitor unless symptoms worsen.  Hyperlipidemia: Ms. Blocher reports recent labs, including lipid panel, through her PCP.  We will request labs to ensure the LDL is at goal (<70).  If goal has not been achieved, we will reach out to Dr. Debara Pickett for further recommendations.  Continue rosuvastatin 40 mg daily pending review of her lipid profile.  Carotid artery stenosis: Mild to moderate disease noted on ultrasound in 10/2019.  Plan for repeat study in 10/2020; continue aspirin and statin therapy.  Preop assessment: Ms. Skilton is scheduled  for dental extractions next month.  She does not report any unstable cardiac symptoms and is >1 year out from PCI to the RCA.  Myocardial perfusion stress test in 11/2019 was low risk with evidence of inferior infarct and no ischemia.  It is reasonable for her to proceed with her dental procedure (low risk) without additional testing or intervention.  We will stop ticagrelor at this time.  Aspirin 81 mg daily should be continued during the perioperative period.  Antibiotic prophylaxis is not indicated from a cardiac standpoint.  Follow-up: Return to clinic in 3 months.  [name], MD 04/27/2020 11:53 AM

## 2020-05-23 ENCOUNTER — Ambulatory Visit (INDEPENDENT_AMBULATORY_CARE_PROVIDER_SITE_OTHER): Payer: Medicare Other | Admitting: Acute Care

## 2020-05-23 ENCOUNTER — Other Ambulatory Visit: Payer: Self-pay

## 2020-05-23 ENCOUNTER — Ambulatory Visit: Admission: RE | Admit: 2020-05-23 | Payer: Medicare Other | Source: Ambulatory Visit

## 2020-05-23 ENCOUNTER — Encounter: Payer: Self-pay | Admitting: Acute Care

## 2020-05-23 DIAGNOSIS — F17210 Nicotine dependence, cigarettes, uncomplicated: Secondary | ICD-10-CM

## 2020-05-23 DIAGNOSIS — Z122 Encounter for screening for malignant neoplasm of respiratory organs: Secondary | ICD-10-CM | POA: Diagnosis not present

## 2020-05-23 NOTE — Progress Notes (Signed)
Virtual Visit via Telephone Note  I connected with [name] on 05/23/20 at 10:30 AM EDT by telephone and verified that I am speaking with the correct person using two identifiers.  Location: Patient: At home Provider: [address], [place], [place], [address]   I discussed the limitations, risks, security and privacy concerns of performing an evaluation and management service by telephone and the availability of in person appointments. I also discussed with the patient that there may be a patient responsible charge related to this service. The patient expressed understanding and agreed to proceed.    Shared Decision Making Visit Lung Cancer Screening Program [id number])   Eligibility:  Age 55 y.o.  Pack Years Smoking History Calculation 47 pack years  (# packs/per year x # years smoked)  Recent History of coughing up blood  no  Unexplained weight loss? no ( >Than 15 pounds within the last 6 months )  Prior History Lung / other cancer no (Diagnosis within the last 5 years already requiring surveillance chest CT Scans).  Smoking Status Current Smoker  Former Smokers: Years since quit: NA  Quit Date: NA  Visit Components:  Discussion included one or more decision making aids. yes  Discussion included risk/benefits of screening. yes  Discussion included potential follow up diagnostic testing for abnormal scans. yes  Discussion included meaning and risk of over diagnosis. yes  Discussion included meaning and risk of False Positives. yes  Discussion included meaning of total radiation exposure. yes  Counseling Included:  Importance of adherence to annual lung cancer LDCT screening. yes  Impact of comorbidities on ability to participate in the program. yes  Ability and willingness to under diagnostic treatment. yes  Smoking Cessation Counseling:  Current Smokers:   Discussed importance of smoking cessation. yes  Information about tobacco cessation  classes and interventions provided to patient. yes  Patient provided with "ticket" for LDCT Scan. yes  Symptomatic Patient. no  Counseling NA  Diagnosis Code: Tobacco Use Z72.0  Asymptomatic Patient yes  Counseling (Intermediate counseling: > three minutes counseling) E3154  Former Smokers:   Discussed the importance of maintaining cigarette abstinence. yes  Diagnosis Code: Personal History of Nicotine Dependence. M08.676  Information about tobacco cessation classes and interventions provided to patient. Yes  Patient provided with "ticket" for LDCT Scan. yes  Written Order for Lung Cancer Screening with LDCT placed in Epic. Yes (CT Chest Lung Cancer Screening Low Dose W/O CM) [id number] Z12.2-Screening of respiratory organs Z87.891-Personal history of nicotine dependence  No VS as this was a virtual visit  I have spent 25 minutes of face to face time with Ms. Blow discussing the risks and benefits of lung cancer screening. We viewed a power point together that explained in detail the above noted topics. We paused at intervals to allow for questions to be asked and answered to ensure understanding.We discussed that the single most powerful action that she can take to decrease her risk of developing lung cancer is to quit smoking. We discussed whether or not she is ready to commit to setting a quit date. We discussed options for tools to aid in quitting smoking including nicotine replacement therapy, non-nicotine medications, support groups, Quit Smart classes, and behavior modification. We discussed that often times setting smaller, more achievable goals, such as eliminating 1 cigarette a day for a week and then 2 cigarettes a day for a week can be helpful in slowly decreasing the number of cigarettes smoked. This allows for a sense of  accomplishment as well as providing a clinical benefit. I gave her the " Be Stronger Than Your Excuses" card with contact information for community  resources, classes, free nicotine replacement therapy, and access to mobile apps, text messaging, and on-line smoking cessation help. I have also given her my card and contact information in the event she needs to contact me. We discussed the time and location of the scan, and that either Doroteo Glassman RN or I will call with the results within 24-48 hours of receiving them. I have offered her  a copy of the power point we viewed  as a resource in the event they need reinforcement of the concepts we discussed today in the office. The patient verbalized understanding of all of  the above and had no further questions upon leaving the office. They have my contact information in the event they have any further questions.  I spent 4 minutes counseling on smoking cessation and the health risks of continued tobacco abuse.  I explained to the patient that there has been a high incidence of coronary artery disease noted on these exams. I explained that this is a non-gated exam therefore degree or severity cannot be determined. This patient is currently on statin therapy. I have asked the patient to follow-up with their PCP regarding any incidental finding of coronary artery disease and management with diet or medication as their PCP  feels is clinically indicated. The patient verbalized understanding of the above and had no further questions upon completion of the visit.   Magdalen Spatz, NP 05/23/2020

## 2020-05-23 NOTE — Patient Instructions (Signed)

## 2020-05-25 ENCOUNTER — Telehealth: Payer: Self-pay | Admitting: Internal Medicine

## 2020-05-25 NOTE — Telephone Encounter (Addendum)
Reports legs are warm and have normal color. 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 Advised to contact PCP office about numbness and tingling in legs.  Reports Mackey Birchwood RN from [facility] came to house yesterday and put a monitor on her big left toe that showed decreased blood flow. Patient says she is unsure why test was done. Contact number for [facility] number [phone] given by patient. Encouraged patient to contact [employer] to send results. Verbalized understanding.  Contacted [employer], Per Jeris Penta, scheduler, she would reach out to the clinical request desk with this request. Spoke with Alain Honey, clinical desk specialist who says that patient's PCP ordered this test and she was unable to give results to our office without patient's permission.   Patient contacted and advised to contact her PCP about result and if she needed a vascular referral-we had vascular doctors she could see for this problem. Verbalized understanding.

## 2020-05-25 NOTE — Telephone Encounter (Signed)
Continue current medications and f/u as planned.  If she or her insurance can forward vascular tests to [place], that would be helpful.  [name], MD [facility]

## 2020-05-25 NOTE — Telephone Encounter (Signed)
Reports starting dilitazem 180 mg daily since 04/29/2020. Reports calling EMS 04/28/20 to come out due to feeling her heart racing. Reports she thinks it was related to recent med changes. Reports now her symptoms have resolved. Reports checking BP yesterday 120/70 & HR 68. Denies dizziness, chest pain or SOB. Medications reviewed. Advised to bring EKG to office for review and continue monitoring BP, HR and symptoms. Advised to contact our office if symptoms return. Verbalized understanding.  Also reports [facility] nurse did a test on her legs that showed a potential blockage and she is suppose to send the report to our office. Advised to touch base with nurse to make sure she does contact our office with any findings. Verbalized understanding

## 2020-05-25 NOTE — Telephone Encounter (Signed)
Patient calling in after insurance health check was preformed yesterday. Patient states that [facility] nurse was going to fax the report to either Dr. Saunders Revel or PCP regarding a potential blockage. Patient is also now asking about her brilinta and amlodipine.  Patient also states she is going to bring in an EKG that was preformed last week when EMS had to come to her home.   Patient is scheduled for chest CT on 4/25

## 2020-05-25 NOTE — Telephone Encounter (Signed)
Patient came by office to drop off copy of EKG from EMS  Placed in nurse box  Patient also states that legs start tingling and get completely numb - has been happening often for a while  Please call to discuss

## 2020-05-28 ENCOUNTER — Ambulatory Visit
Admission: RE | Admit: 2020-05-28 | Discharge: 2020-05-28 | Disposition: A | Discharge status: Home / Self Care | Payer: Medicare Other | Source: Ambulatory Visit | Attending: Acute Care | Admitting: Acute Care

## 2020-05-28 ENCOUNTER — Other Ambulatory Visit: Payer: Self-pay

## 2020-05-28 DIAGNOSIS — F17210 Nicotine dependence, cigarettes, uncomplicated: Secondary | ICD-10-CM | POA: Diagnosis present

## 2020-05-28 DIAGNOSIS — J439 Emphysema, unspecified: Secondary | ICD-10-CM | POA: Diagnosis not present

## 2020-05-28 DIAGNOSIS — Z122 Encounter for screening for malignant neoplasm of respiratory organs: Secondary | ICD-10-CM | POA: Insufficient documentation

## 2020-05-28 DIAGNOSIS — I700 Atherosclerosis of aorta: Secondary | ICD-10-CM | POA: Insufficient documentation

## 2020-05-28 DIAGNOSIS — Z87891 Personal history of nicotine dependence: Secondary | ICD-10-CM | POA: Insufficient documentation

## 2020-05-28 NOTE — Telephone Encounter (Signed)
EKG tracings obtained from nurse box that the patient brought by from EMS. Appears to be NSR.  EKG tracing placed in Dr. Darnelle Bos in box on his desk to review when he is back in the office.

## 2020-05-29 NOTE — Telephone Encounter (Signed)
I spoke with the patient and have advised her that Dr. Saunders Revel has reviewed her EKG tracing from EMS.  She is aware of his impression and recommendations and voices understanding.   I have asked the patient to please reach out to her PCP and see if they can forward [place] the studies she had done through [facility]. She advised she will call them today.  I have given her our fax # to relay to her PCP office.

## 2020-05-29 NOTE — Telephone Encounter (Signed)
Attempted to call the patient. No answer- I left a message to please call back.  

## 2020-05-29 NOTE — Telephone Encounter (Signed)
EMS EKG dated 04/28/2020 has been reviewed and shows normal sinus rhythm with poor R wave progression.  Otherwise, no significant abnormality.  I recommend that Kimberly Kemp continue her current medications.  If she continues to have pain/paresthesias in her feet, we could arrange for ABI's if not already done by her PCP/insurance.  [name], MD [facility]

## 2020-06-07 ENCOUNTER — Other Ambulatory Visit: Payer: Self-pay | Admitting: *Deleted

## 2020-06-07 ENCOUNTER — Encounter: Payer: Self-pay | Admitting: *Deleted

## 2020-06-07 DIAGNOSIS — Z87891 Personal history of nicotine dependence: Secondary | ICD-10-CM

## 2020-06-07 DIAGNOSIS — F17210 Nicotine dependence, cigarettes, uncomplicated: Secondary | ICD-10-CM

## 2020-06-07 NOTE — Progress Notes (Signed)
Please call patient and let them  know their  low dose Ct was read as a Lung RADS 1, negative study: no nodules or definitely benign nodules. Radiology recommendation is for a repeat LDCT in 12 months. .Please let them  know we will order and schedule their  annual screening scan for 05/2021. Please let them  know there was notation of CAD on their  scan.  Please remind the patient  that this is a non-gated exam therefore degree or severity of disease  cannot be determined. Please have them  follow up with their PCP regarding potential risk factor modification, dietary therapy or pharmacologic therapy if clinically indicated. Pt.  is currently on statin therapy. Please place order for annual  screening scan for  05/2021 and fax results to PCP. Thanks so much. 

## 2020-07-15 ENCOUNTER — Telehealth: Payer: Self-pay | Admitting: Pulmonary Disease

## 2020-07-15 DIAGNOSIS — J441 Chronic obstructive pulmonary disease with (acute) exacerbation: Secondary | ICD-10-CM

## 2020-07-15 MED ORDER — AMOXICILLIN-POT CLAVULANATE 875-125 MG PO TABS: 1.0000 | ORAL_TABLET | Freq: Two times a day (BID) | ORAL | 0 refills | Status: DC

## 2020-07-15 MED ORDER — PREDNISONE 10 MG PO TABS: 40.0000 mg | ORAL_TABLET | Freq: Every day | ORAL | 0 refills | Status: AC

## 2020-07-15 NOTE — Telephone Encounter (Signed)
Weekend Phone Note:  Patient reports having chest congestion, dry cough, nausea and lack of appetite since this past Monday.  She denies any fevers or chills.  She is also experiencing an increase in shortness of breath and wheezing at night.  She denies sinus congestion.  She reports in the past her primary care has sent in amoxicillin and a steroid course and she usually improves.  She appears to be having a COPD exacerbation, possibly from a viral upper respiratory tract infection.  I will send in a course of Augmentin along with 5 days of prednisone 40 mg p.o. daily.  She is to call the office if she is not feeling better in a couple days to arrange a sick visit.  Freda Jackson, MD [facility] Office: [phone]   See Amion for personal pager PCCM on call pager [phone] until 7pm. Please call Elink 7p-7a. [phone]

## 2020-08-08 ENCOUNTER — Encounter: Payer: Self-pay | Admitting: Internal Medicine

## 2020-08-08 ENCOUNTER — Other Ambulatory Visit: Payer: Self-pay

## 2020-08-08 ENCOUNTER — Ambulatory Visit (INDEPENDENT_AMBULATORY_CARE_PROVIDER_SITE_OTHER): Payer: Medicare Other | Admitting: Internal Medicine

## 2020-08-08 VITALS — BP 130/80 | HR 75 | Ht 63.0 in | Wt 125.0 lb

## 2020-08-08 DIAGNOSIS — R002 Palpitations: Secondary | ICD-10-CM | POA: Diagnosis not present

## 2020-08-08 DIAGNOSIS — I25118 Atherosclerotic heart disease of native coronary artery with other forms of angina pectoris: Secondary | ICD-10-CM

## 2020-08-08 DIAGNOSIS — I6523 Occlusion and stenosis of bilateral carotid arteries: Secondary | ICD-10-CM

## 2020-08-08 DIAGNOSIS — E782 Mixed hyperlipidemia: Secondary | ICD-10-CM

## 2020-08-08 MED ORDER — EZETIMIBE 10 MG PO TABS: 10.0000 mg | ORAL_TABLET | Freq: Every day | ORAL | 3 refills | Status: DC

## 2020-08-08 NOTE — Progress Notes (Signed)
Follow-up Outpatient Visit Date: 08/08/2020  Primary Care Provider: Martin Majestic, FNP [place] [id number]  Chief Complaint: Follow-up CAD and palpitations.  HPI:  Kimberly Kemp is a 55 y.o. female with history of coronary artery disease status post PCI to the RCA complicated by iatrogenic dissection of the RCA (02/22/2019), ischemic cardiomyopathy, hypertension, familial hyperlipidemia, stroke, questionable paroxysmal atrial fibrillation, questionable pulmonary embolism, COPD, colon cancer, hepatitis B, anxiety, and tobacco abuse, who presents for follow-up of CAD, HLD, and stroke.  I last saw her in March, at which time Ms. Fern noted occasional palpitations with associated mild chest pain.  We agreed to switch from amlodipine to diltiazem.  Today, Ms. Norsworthy reports that her palpitations are significantly less frequent with additional of diltiazem.  She is tolerating the medication with side effects.  She denies chest pain, lightheadedness, and edema.  She had a recent respiratory infection with associated shortness of breath, treated with amoxicillin and corticosteroids.  Her breathing is back to baseline.  She continues to smoke a few cigarettes/day but is working on cutting down.  She is no longer taking ticagrelor (we has previously stopped this though she may have been told by another provider to restart this).  --------------------------------------------------------------------------------------------------  Cardiovascular History & Procedures: Cardiovascular Problems: Coronary artery disease s/p PCI to RCA (02/2019) Paroxysmal atrial fibrillation (questionable; no objective documentation in the patient's chart) Pulmonary embolism (questionable; no objective documentation in the patient's chart) Shortness of breath   Risk Factors: Prior stroke, hypertension, hyperlipidemia, and tobacco use   Cath/PCI: RHC/LHC and PCI (02/22/2019): LMCA normal.  LAD normal.  LCx  normal.  Dominant RCA with 60 to 70% mid vessel stenosis.  RA 9, RV 33/9, PA 26/13, PCWP 13, Fick CO/CI 7.8/4.9.  LVEDP 15.  Attempted IFR of mid RCA complicated by guide wire dissection necessitating stenting of the ostial through distal RCA with overlapping Resolute Onyx stents (2.75 x 38 and 3.0 x 38 mm).  RCA was jailed with loss of flow.   CV Surgery: None   EP Procedures and Devices: 14-day event monitor (01/05/2019): Predominantly sinus rhythm with rare PAC's and PVC's, as well as rare episodes of brief PSVT.  Patient triggered events correspond to sinus rhythm.   Non-Invasive Evaluation(s): Pharmacologic MPI (11/16/2019): Low risk study with fixed defect at the apex.  On personal review, the fixed defect involves the inferior wall as well.  No evidence of ischemia.  LVEF 55-65%. Carotid Doppler (10/24/2019): Right ICA with 40-59% stenosis, left ICA with 1-39% stenosis.  Antegrade flow in both vertebral arteries.  Normal subclavian artery flow dynamics. TTE (02/23/2019): Normal LV size and wall thickness.  LVEF 55-60% with inferoseptal akinesis.  Normal RV size and function.  No significant valvular abnormality.  Normal PA pressure. Dobutamine stress echocardiogram (11/16/2018): Low risk study with normal baseline LVEF and no evidence of inducible wall motion abnormality.  LVEF 55-60%. Exercise tolerance test (09/20/2018): Low to intermediate risk study with decreased exercise capacity and significant motion artifact.  Recent CV Pertinent Labs: Lab Results  Component Value Date   CHOL 305 (H) 09/22/2015   HDL 83 09/22/2015   LDLCALC 174 (H) 09/22/2015   TRIG 241 (H) 09/22/2015   CHOLHDL 3.7 09/22/2015   K 4.0 04/13/2019   BUN 17 04/13/2019   CREATININE 0.90 04/13/2019    Past medical and surgical history were reviewed and updated in EPIC.  Current Meds  Medication Sig   albuterol (PROAIR HFA) 108 (90 Base) MCG/ACT inhaler  Inhale 2 puffs into the lungs every 4 (four) hours as needed  for wheezing or shortness of breath.   albuterol (PROVENTIL) (2.5 MG/3ML) 0.083% nebulizer solution TAKE 3 MLS (2.5 MG TOTAL) BY NEBULIZATION 4 TIMES DAILY AS NEEDED FOR WHEEZING SHORTNESS OF BREATH   amoxicillin-clavulanate (AUGMENTIN) 875-125 MG tablet Take 1 tablet by mouth 2 (two) times daily.   aspirin EC 81 MG tablet Take 81 mg by mouth daily.   BRILINTA 90 MG TABS tablet Take 90 mg by mouth 2 (two) times daily.   budesonide (PULMICORT) 0.25 MG/2ML nebulizer solution TAKE 2 MLS (0.25 MG TOTAL) BY NEBULIZATION 2 (TWO) TIMES DAILY.   butalbital-acetaminophen-caffeine (FIORICET, ESGIC) 50-325-40 MG tablet Take 1 tablet by mouth 3 (three) times daily as needed for headache.   diltiazem (CARDIZEM CD) 180 MG 24 hr capsule Take 1 capsule (180 mg total) by mouth daily.   famotidine (PEPCID) 40 MG tablet Take 40 mg by mouth at bedtime.   gabapentin (NEURONTIN) 300 MG capsule Take 300 mg by mouth at bedtime.   ipratropium (ATROVENT) 0.02 % nebulizer solution Take 0.5 mg by nebulization 4 (four) times daily.   magic mouthwash w/lidocaine SOLN Take 10 mLs by mouth 4 (four) times daily as needed for mouth pain.   metoprolol tartrate (LOPRESSOR) 25 MG tablet TAKE 1.5 TABLETS (37.5 MG TOTAL) BY MOUTH 2 (TWO) TIMES DAILY.   montelukast (SINGULAIR) 10 MG tablet Take 10 mg by mouth daily.   nitroGLYCERIN (NITROSTAT) 0.4 MG SL tablet PLACE 1 TABLET (0.4 MG TOTAL) UNDER THE TONGUE EVERY 5 (FIVE) MINUTES AS NEEDED FOR CHEST PAIN. MAXIMUM OF 3 DOSES.   Omega-3 Fatty Acids (FISH OIL PO) Take 1 capsule by mouth 2 (two) times daily.   omeprazole (PRILOSEC) 40 MG capsule TAKE 1 CAPSULE BY MOUTH EVERY DAY   PAZEO 0.7 % SOLN Place 1 drop into both eyes daily.   promethazine-codeine (PHENERGAN WITH CODEINE) 6.25-10 MG/5ML syrup Take 5 mLs by mouth at bedtime as needed for cough.   rosuvastatin (CRESTOR) 40 MG tablet TAKE 1 TABLET BY MOUTH EVERY DAY    Allergies: Penicillins, Omnipaque [iohexol], and Red dye  Social  History   Tobacco Use   Smoking status: Every Day    Packs/day: 0.20    Years: 43.00    Pack years: 8.60    Types: Cigarettes   Smokeless tobacco: Never   Tobacco comments:    3 per day  Vaping Use   Vaping Use: Never used  Substance Use Topics   Alcohol use: No    Alcohol/week: 0.0 standard drinks   Drug use: No    Family History  Problem Relation Age of Onset   Emphysema Father    Prostate cancer Father    Liver disease Father    Emphysema Maternal Grandmother    Diabetes Mother    Heart disease Mother 59       CABG and valve replacement   Colon cancer Neg Hx     Review of Systems: A 12-system review of systems was performed and was negative except as noted in the HPI.  --------------------------------------------------------------------------------------------------  Physical Exam: BP 130/80 (BP Location: Left Arm, Patient Position: Sitting, Cuff Size: Normal)   Pulse 75   Ht 5\' 3"  (1.6 m)   Wt 125 lb (56.7 kg)   LMP 07/18/2016   SpO2 98%   BMI 22.14 kg/m   General:  NAD. Neck: No JVD or HJR. Lungs: Mildly diminished breath sounds throughout without wheezes or crackles. Heart:  Regular rate and rhythm without murmurs, rubs, or gallops. Abdomen: Soft, nontender, nondistended. Extremities: No lower extremity edema.  EKG:  Normal sinus rhythm with inferior infarct.  No significant change since 04/27/2020.  Lab Results  Component Value Date   WBC 11.6 (H) 04/13/2019   HGB 13.4 04/13/2019   HCT 39.7 04/13/2019   MCV 92.1 04/13/2019   PLT 266 04/13/2019    Lab Results  Component Value Date   NA 140 04/13/2019   K 4.0 04/13/2019   CL 105 04/13/2019   CO2 25 04/13/2019   BUN 17 04/13/2019   CREATININE 0.90 04/13/2019   GLUCOSE 112 (H) 04/13/2019   ALT 25 05/06/2018    Lab Results  Component Value Date   CHOL 305 (H) 09/22/2015   HDL 83 09/22/2015   LDLCALC 174 (H) 09/22/2015   TRIG 241 (H) 09/22/2015   CHOLHDL 3.7 09/22/2015     --------------------------------------------------------------------------------------------------  ASSESSMENT AND PLAN: Coronary artery disease with stable angina: No chest pain reported on current antianginal regimen of metoprolol and diltiazem.  No medication changes today.  We will continue aspirin 81 mg daily; defer restarting P2Y12 inhibitor at this time.  Hyperlipidemia: Most recent outside lipid panel from PCP showed LDL above goal.  We will continue rosuvastatin 40 mg daily and add ezetimibe 10 mg daily with plans for follow-up lipid panel and ALT in ~3 months.  Palpitations: Sx well-controlled with current regimen of diltiazem and metoprolol.  Carotid artery stenosis: Mild to moderate disease on most recent duplex in 10/2019.  Plan for repeat study in 10/2020.  Continue aspirin and aggressive lipid therapy, as above.  Follow-up: Return to clinic in 6 months.  [name], MD 08/08/2020 10:16 AM

## 2020-08-08 NOTE — Patient Instructions (Signed)
Medication Instructions:  Your physician has recommended you make the following change in your medication:   STOP Brilinta  START Zetia 10 mg daily. An Rx has been sent to your pharmacy.   *If you need a refill on your cardiac medications before your next appointment, please call your pharmacy*   Lab Work: Your physician recommends that you return for a FASTING lipid profile and hepatic panel: in 3 months.  Please have your labs draw at the [facility] medical mall. No appt needed. Lab hours are Mon-Fri 7:30am-6pm.  If you have labs (blood work) drawn today and your tests are completely normal, you will receive your results only by: [place] (if you have MyChart) OR A paper copy in the mail If you have any lab test that is abnormal or we need to change your treatment, we will call you to review the results.   Testing/Procedures: None ordered   Follow-Up: At [facility], you and your health needs are our priority.  As part of our continuing mission to provide you with exceptional heart care, we have created designated Provider Care Teams.  These Care Teams include your primary Cardiologist (physician) and Advanced Practice Providers (APPs -  Physician Assistants and Nurse Practitioners) who all work together to provide you with the care you need, when you need it.  We recommend signing up for the patient portal called "MyChart".  Sign up information is provided on this After Visit Summary.  MyChart is used to connect with patients for Virtual Visits (Telemedicine).  Patients are able to view lab/test results, encounter notes, upcoming appointments, etc.  Non-urgent messages can be sent to your provider as well.   To learn more about what you can do with MyChart, go to [email or web address].    Your next appointment:   Your physician wants you to follow-up in: 6 months You will receive a reminder letter in the mail two months in advance. If you don't receive a letter, please call  our office to schedule the follow-up appointment.   The format for your next appointment:   In Person  Provider:   You may see [name], MD or one of the following Advanced Practice Providers on your designated Care Team:   Murray Hodgkins, NP Christell Faith, PA-C Marrianne Mood, PA-C Cadence Pioneer, [place] Laurann [place], NP   Other Instructions N/A

## 2020-08-14 ENCOUNTER — Other Ambulatory Visit: Payer: Self-pay | Admitting: Internal Medicine

## 2020-08-14 ENCOUNTER — Other Ambulatory Visit: Payer: Self-pay | Admitting: Nurse Practitioner

## 2020-09-05 ENCOUNTER — Other Ambulatory Visit: Payer: Self-pay | Admitting: Internal Medicine

## 2020-09-05 ENCOUNTER — Other Ambulatory Visit: Payer: Self-pay | Admitting: Nurse Practitioner

## 2020-09-06 ENCOUNTER — Telehealth: Payer: Self-pay | Admitting: Pulmonary Disease

## 2020-09-06 NOTE — Telephone Encounter (Signed)
Called and spoke with Patient.  Patient requested a refill for Promethazine codeine to be sent to [place].  Last refill- 04/09/20, take 14m at bedtime as needed for cough, #[id number] no refills   Message routed to Dr. SoHalford Chessmano advise

## 2020-09-07 MED ORDER — PROMETHAZINE-CODEINE 6.25-10 MG/5ML PO SYRP: 5.0000 mL | ORAL_SOLUTION | Freq: Every evening | ORAL | 0 refills | Status: DC | PRN

## 2020-09-07 NOTE — Telephone Encounter (Signed)
Refill sent.

## 2020-09-07 NOTE — Telephone Encounter (Signed)
Pt notified rx was sent  Nothing further needed

## 2020-10-02 ENCOUNTER — Other Ambulatory Visit: Payer: Self-pay | Admitting: Pulmonary Disease

## 2020-10-17 ENCOUNTER — Other Ambulatory Visit: Payer: Self-pay | Admitting: Nurse Practitioner

## 2020-11-08 ENCOUNTER — Other Ambulatory Visit: Payer: Medicare Other

## 2020-11-08 ENCOUNTER — Other Ambulatory Visit
Admission: RE | Admit: 2020-11-08 | Discharge: 2020-11-08 | Disposition: A | Discharge status: Home / Self Care | Payer: Medicare Other | Source: Ambulatory Visit | Attending: Internal Medicine | Admitting: Internal Medicine

## 2020-11-08 DIAGNOSIS — E782 Mixed hyperlipidemia: Secondary | ICD-10-CM | POA: Insufficient documentation

## 2020-11-08 LAB — LIPID PANEL
Cholesterol: 170 mg/dL (ref 0–200)
HDL: 67 mg/dL (ref >40)
LDL Cholesterol: 65 mg/dL (ref 0–99)
Total CHOL/HDL Ratio: 2.5 RATIO
Triglycerides: 192 mg/dL — ABNORMAL HIGH (ref <150)
VLDL: 38 mg/dL (ref 0–40)

## 2020-11-08 LAB — HEPATIC FUNCTION PANEL
ALT: 30 U/L (ref 0–44)
AST: 32 U/L (ref 15–41)
Albumin: 3.7 g/dL (ref 3.5–5.0)
Alkaline Phosphatase: 123 U/L (ref 38–126)
Bilirubin, Direct: <0.1 mg/dL (ref 0.0–0.2)
Total Bilirubin: 0.5 mg/dL (ref 0.3–1.2)
Total Protein: 7.2 g/dL (ref 6.5–8.1)

## 2020-11-13 ENCOUNTER — Telehealth: Payer: Self-pay | Admitting: Internal Medicine

## 2020-11-13 NOTE — Telephone Encounter (Signed)
Patient calling to get lab work results

## 2020-11-14 NOTE — Telephone Encounter (Signed)
Attempted to call pt. Lmtcb.  Results have been released to [place].

## 2021-02-01 ENCOUNTER — Telehealth: Payer: Self-pay | Admitting: Internal Medicine

## 2021-02-01 NOTE — Telephone Encounter (Signed)
Patient called in regarding her shortness of breath and pneumonia. She has seen her PCP and they did prescribe her antibiotics. She just wanted to make sure these symptoms were not related. Provided reassurance that hopefully she will start feeling better with the medication. She verbalized understanding with no further questions at this time.

## 2021-02-01 NOTE — Telephone Encounter (Signed)
Pt c/o Shortness Of Breath: STAT if SOB developed within the last 24 hours or pt is noticeably SOB on the phone  1. Are you currently SOB (can you hear that pt is SOB on the phone)? Yes, COPD  2. How long have you been experiencing SOB? A few days  3. Are you SOB when sitting or when up moving around? Thru the night while sleeping  4. Are you currently experiencing any other symptoms? Pneumonia now

## 2021-02-04 ENCOUNTER — Other Ambulatory Visit: Payer: Self-pay | Admitting: Pulmonary Disease

## 2021-02-04 ENCOUNTER — Other Ambulatory Visit: Payer: Self-pay | Admitting: Nurse Practitioner

## 2021-02-04 ENCOUNTER — Other Ambulatory Visit: Payer: Self-pay | Admitting: Internal Medicine

## 2021-02-04 DIAGNOSIS — E7801 Familial hypercholesterolemia: Secondary | ICD-10-CM

## 2021-02-05 ENCOUNTER — Telehealth: Payer: Self-pay | Admitting: Pulmonary Disease

## 2021-02-05 NOTE — Telephone Encounter (Signed)
Called the pt and she was not there. LMTCB.

## 2021-02-05 NOTE — Telephone Encounter (Signed)
Please contact pt for future appointment. ?Pt due for 6 month f/u. ?Pt needing refills. ?

## 2021-02-05 NOTE — Telephone Encounter (Signed)
LVM to schedule appt

## 2021-02-06 MED ORDER — ALBUTEROL SULFATE HFA 108 (90 BASE) MCG/ACT IN AERS: INHALATION_SPRAY | RESPIRATORY_TRACT | 2 refills | Status: DC

## 2021-02-06 MED ORDER — IPRATROPIUM BROMIDE 0.02 % IN SOLN: 0.5000 mg | Freq: Four times a day (QID) | RESPIRATORY_TRACT | 5 refills | Status: DC

## 2021-02-06 MED ORDER — PROMETHAZINE-CODEINE 6.25-10 MG/5ML PO SYRP: 5.0000 mL | ORAL_SOLUTION | Freq: Every evening | ORAL | 0 refills | Status: DC | PRN

## 2021-02-06 NOTE — Telephone Encounter (Signed)
Called and spoke with patient. She was requesting a refill on her albuterol inhaler, Atrovent nebulizer solution and promethazine cough syrup. She stated that she was recently diagnosed with PNA and the coughing has increased. Advised her that I could send in the neb solution and inhaler but would need to check with Dr. Halford Chessman about the cough syrup. She verbalized understanding.   VS, can you please advise if you are ok with her having the promethazine cough syrup? [facility] [place]. Thanks!

## 2021-02-06 NOTE — Telephone Encounter (Signed)
Patient is returning phone call. Will be available after 11:00 am. Patient phone number is [phone].

## 2021-02-06 NOTE — Telephone Encounter (Signed)
Called and spoke with patient to let her know that refills have been sent to her pharmacy. She expressed understanding. Nothing further needed at this time.

## 2021-02-06 NOTE — Telephone Encounter (Signed)
I have sent a one time order for phenergan with codeine cough syrup.

## 2021-02-06 NOTE — Telephone Encounter (Signed)
Called pt and spoke with spouse who stated pt was not currently at home. Asked him to have her call the office once she returned home and he stated he would. Will await return call prior to sending meds to pharmacy and prior to sending this to Dr. Halford Chessman about the hycodan request.

## 2021-02-08 ENCOUNTER — Telehealth: Payer: Self-pay | Admitting: Pulmonary Disease

## 2021-02-08 MED ORDER — HYDROCODONE BIT-HOMATROP MBR 5-1.5 MG/5ML PO SOLN: 5.0000 mL | Freq: Four times a day (QID) | ORAL | 0 refills | Status: DC | PRN

## 2021-02-08 NOTE — Telephone Encounter (Signed)
[facility] Prom[facility]azine with Codeine. Can you please send something else? Thanks

## 2021-02-08 NOTE — Telephone Encounter (Signed)
Patient states needs more Atrovent solution. [facility]acy [facility] [place]. Patient phone number is [phone].

## 2021-02-08 NOTE — Telephone Encounter (Signed)
Left message for patient to call back  

## 2021-02-08 NOTE — Telephone Encounter (Signed)
Please let her know I have sent a script for hycodan cough syrup.

## 2021-02-08 NOTE — Telephone Encounter (Signed)
Scheduled

## 2021-02-14 NOTE — Telephone Encounter (Signed)
Called and left detailed message about pts script being ready for pick up. Nothing further needed.

## 2021-02-21 ENCOUNTER — Other Ambulatory Visit: Payer: Self-pay

## 2021-02-21 ENCOUNTER — Encounter: Payer: Self-pay | Admitting: Internal Medicine

## 2021-02-21 ENCOUNTER — Ambulatory Visit (INDEPENDENT_AMBULATORY_CARE_PROVIDER_SITE_OTHER): Payer: Medicare Other | Admitting: Internal Medicine

## 2021-02-21 VITALS — BP 120/70 | HR 78 | Ht 63.0 in | Wt 121.0 lb

## 2021-02-21 DIAGNOSIS — I6523 Occlusion and stenosis of bilateral carotid arteries: Secondary | ICD-10-CM

## 2021-02-21 DIAGNOSIS — E782 Mixed hyperlipidemia: Secondary | ICD-10-CM

## 2021-02-21 DIAGNOSIS — R002 Palpitations: Secondary | ICD-10-CM | POA: Diagnosis not present

## 2021-02-21 DIAGNOSIS — J449 Chronic obstructive pulmonary disease, unspecified: Secondary | ICD-10-CM | POA: Diagnosis not present

## 2021-02-21 DIAGNOSIS — I25119 Atherosclerotic heart disease of native coronary artery with unspecified angina pectoris: Secondary | ICD-10-CM

## 2021-02-21 NOTE — Patient Instructions (Addendum)
Medication Instructions:  - Your physician recommends that you continue on your current medications as directed. Please refer to the Current Medication list given to you today.  *If you need a refill on your cardiac medications before your next appointment, please call your pharmacy*   Lab Work: - none ordered  If you have labs (blood work) drawn today and your tests are completely normal, you will receive your results only by: [place] (if you have MyChart) OR A paper copy in the mail If you have any lab test that is abnormal or we need to change your treatment, we will call you to review the results.   Testing/Procedures: - Your physician has requested that you have a carotid duplex. This test is an ultrasound of the carotid arteries in your neck. It looks at blood flow through these arteries that supply the brain with blood. Allow one hour for this exam. There are no restrictions or special instructions.    Follow-Up: At [facility], you and your health needs are our priority.  As part of our continuing mission to provide you with exceptional heart care, we have created designated Provider Care Teams.  These Care Teams include your primary Cardiologist (physician) and Advanced Practice Providers (APPs -  Physician Assistants and Nurse Practitioners) who all work together to provide you with the care you need, when you need it.  We recommend signing up for the patient portal called "MyChart".  Sign up information is provided on this After Visit Summary.  MyChart is used to connect with patients for Virtual Visits (Telemedicine).  Patients are able to view lab/test results, encounter notes, upcoming appointments, etc.  Non-urgent messages can be sent to your provider as well.   To learn more about what you can do with MyChart, go to [email or web address].    Your next appointment:   3 month(s)  The format for your next appointment:   In Person  Provider:   You may see  [name], MD or one of the following Advanced Practice Providers on your designated Care Team:   Murray Hodgkins, NP Christell Faith, PA-C Cadence Kathlen Mody, [place]    Other Instructions - If you have any further episodes of chest pain, please contact the office at [phone]

## 2021-02-21 NOTE — Addendum Note (Signed)
Addended by: Alvis Lemmings C on: 02/21/2021 04:21 PM   Modules accepted: Orders

## 2021-02-21 NOTE — Progress Notes (Signed)
Follow-up Outpatient Visit Date: 02/21/2021  Primary Care Provider: Martin Majestic, FNP [place] [address]  Chief Complaint: Shortness of breath  HPI:  Kimberly Kemp is a 56 y.o. female with history of coronary artery disease status post PCI to the RCA in the setting of iatrogenic dissection of the RCA (02/22/2019), ischemic cardiomyopathy, hypertension, familial hyperlipidemia, stroke, questionable paroxysmal atrial fibrillation, questionable pulmonary embolism, COPD, colon cancer, hepatitis B, anxiety, and tobacco abuse, who presents for follow-up of coronary artery disease.  I last saw her in July, at which time [name] reported significant improvement in her palpitations after we switched from amlodipine to diltiazem.  She denied having chest pain, lightheadedness, and edema.  Chronic shortness of breath was back to baseline following treatment of a recent respiratory infection.  Due to suboptimal lipid control, ezetimibe was added to rosuvastatin 40 mg daily.  Follow-up lipid panel in October showed improved LDL of 65.  Today, [name] reports an episode of sudden onset shortness of breath and tightness in the chest that happened while driving about 6 weeks ago.  It forced her to pull over and take a nitroglycerin.  Dyspnea and chest tightness resolved promptly.  She has not had a recurrence.  She notes that it feels different than the MI she had at the time of her catheterization in 2021.  It was somewhat reminiscent of her pulmonary embolism many years ago.  It has not recurred since.  She has noticed a few sporadic palpitations since then, which have been self-limited and without other associated symptoms.  She notes some mild swelling of the left ankle that began 3-4 weeks ago.  She has been compliant with her medications.  She is trying to cut down on her smoking and notes that it has been significantly less when she cares for her  grandchildren.  --------------------------------------------------------------------------------------------------  Cardiovascular History & Procedures: Cardiovascular Problems: Coronary artery disease s/p PCI to RCA (02/2019) Paroxysmal atrial fibrillation (questionable; no objective documentation in the patient's chart) Pulmonary embolism (questionable; no objective documentation in the patient's chart) Shortness of breath   Risk Factors: Prior stroke, hypertension, hyperlipidemia, and tobacco use   Cath/PCI: RHC/LHC and PCI (02/22/2019): LMCA normal.  LAD normal.  LCx normal.  Dominant RCA with 60 to 70% mid vessel stenosis.  RA 9, RV 33/9, PA 26/13, PCWP 13, Fick CO/CI 7.8/4.9.  LVEDP 15.  Attempted IFR of mid RCA complicated by guide wire dissection necessitating stenting of the ostial through distal RCA with overlapping Resolute Onyx stents (2.75 x 38 and 3.0 x 38 mm).  RCA was jailed with loss of flow.   CV Surgery: None   EP Procedures and Devices: 14-day event monitor (01/05/2019): Predominantly sinus rhythm with rare PAC's and PVC's, as well as rare episodes of brief PSVT.  Patient triggered events correspond to sinus rhythm.   Non-Invasive Evaluation(s): Pharmacologic MPI (11/16/2019): Low risk study with fixed defect at the apex.  On personal review, the fixed defect involves the inferior wall as well.  No evidence of ischemia.  LVEF 55-65%. Carotid Doppler (10/24/2019): Right ICA with 40-59% stenosis, left ICA with 1-39% stenosis.  Antegrade flow in both vertebral arteries.  Normal subclavian artery flow dynamics. TTE (02/23/2019): Normal LV size and wall thickness.  LVEF 55-60% with inferoseptal akinesis.  Normal RV size and function.  No significant valvular abnormality.  Normal PA pressure. Dobutamine stress echocardiogram (11/16/2018): Low risk study with normal baseline LVEF and no evidence of inducible wall  motion abnormality.  LVEF 55-60%. Exercise tolerance test  (09/20/2018): Low to intermediate risk study with decreased exercise capacity and significant motion artifact.  Recent CV Pertinent Labs: Lab Results  Component Value Date   CHOL 170 11/08/2020   HDL 67 11/08/2020   LDLCALC 65 11/08/2020   TRIG 192 (H) 11/08/2020   CHOLHDL 2.5 11/08/2020   K 4.0 04/13/2019   BUN 17 04/13/2019   CREATININE 0.90 04/13/2019    Past medical and surgical history were reviewed and updated in EPIC.  Current Meds  Medication Sig   albuterol (PROVENTIL) (2.5 MG/3ML) 0.083% nebulizer solution TAKE 3 MLS (2.5 MG TOTAL) BY NEBULIZATION 4 TIMES DAILY AS NEEDED FOR WHEEZING SHORTNESS OF BREATH   albuterol (VENTOLIN HFA) 108 (90 Base) MCG/ACT inhaler INHALE 2 PUFFS INTO THE LUNGS EVERY 4 HOURS AS NEEDED FOR WHEEZE OR FOR SHORTNESS OF BREATH   aspirin EC 81 MG tablet Take 81 mg by mouth daily.   budesonide (PULMICORT) 0.25 MG/2ML nebulizer solution TAKE 2 MLS (0.25 MG TOTAL) BY NEBULIZATION 2 (TWO) TIMES DAILY.   butalbital-acetaminophen-caffeine (FIORICET, ESGIC) 50-325-40 MG tablet Take 1 tablet by mouth 3 (three) times daily as needed for headache.   diltiazem (CARDIZEM CD) 180 MG 24 hr capsule TAKE 1 CAPSULE BY MOUTH EVERY DAY   ezetimibe (ZETIA) 10 MG tablet Take 1 tablet (10 mg total) by mouth daily.   famotidine (PEPCID) 40 MG tablet Take 40 mg by mouth at bedtime.   gabapentin (NEURONTIN) 300 MG capsule Take 300 mg by mouth at bedtime.   HYDROcodone bit-homatropine (HYCODAN) 5-1.5 MG/5ML syrup Take 5 mLs by mouth every 6 (six) hours as needed for cough.   ipratropium (ATROVENT) 0.02 % nebulizer solution Take 2.5 mLs (0.5 mg total) by nebulization 4 (four) times daily.   metoprolol tartrate (LOPRESSOR) 25 MG tablet TAKE 1 & 1/2 TABLETS (37.5 MG TOTAL) BY MOUTH 2 (TWO) TIMES DAILY.   montelukast (SINGULAIR) 10 MG tablet Take 10 mg by mouth daily.   nitroGLYCERIN (NITROSTAT) 0.4 MG SL tablet PLACE 1 TABLET (0.4 MG TOTAL) UNDER THE TONGUE EVERY 5 (FIVE) MINUTES AS  NEEDED FOR CHEST PAIN. MAXIMUM OF 3 DOSES.   Omega-3 Fatty Acids (FISH OIL PO) Take 1 capsule by mouth 2 (two) times daily.   omeprazole (PRILOSEC) 40 MG capsule TAKE 1 CAPSULE BY MOUTH EVERY DAY   PAZEO 0.7 % SOLN Place 1 drop into both eyes daily.   pramipexole (MIRAPEX) 1 MG tablet Take 1 mg by mouth at bedtime.   rosuvastatin (CRESTOR) 40 MG tablet TAKE 1 TABLET BY MOUTH EVERY DAY    Allergies: Penicillins, Omnipaque [iohexol], and Red dye  Social History   Tobacco Use   Smoking status: Every Day    Packs/day: 0.20    Years: 43.00    Pack years: 8.60    Types: Cigarettes   Smokeless tobacco: Never   Tobacco comments:    3 per day  Vaping Use   Vaping Use: Never used  Substance Use Topics   Alcohol use: No    Alcohol/week: 0.0 standard drinks   Drug use: No    Family History  Problem Relation Age of Onset   Emphysema Father    Prostate cancer Father    Liver disease Father    Emphysema Maternal Grandmother    Diabetes Mother    Heart disease Mother 53       CABG and valve replacement   Colon cancer Neg Hx     Review of  Systems: A 12-system review of systems was performed and was negative except as noted in the HPI.  --------------------------------------------------------------------------------------------------  Physical Exam: BP 120/70 (BP Location: Left Arm, Patient Position: Sitting, Cuff Size: Normal)    Pulse 78    Ht 5\' 3"  (1.6 m)    Wt 121 lb (54.9 kg)    LMP 07/18/2016    SpO2 99%    BMI 21.43 kg/m   General:  NAD. Neck: No JVD or HJR. Lungs: Clear to auscultation bilaterally without wheezes or crackles. Heart: Regular rate and rhythm without murmurs, rubs, or gallops. Abdomen: Soft, nontender, nondistended. Extremities: No lower extremity edema.  EKG: Normal sinus rhythm with inferior infarct.  No significant change from prior tracing on 08/08/2020.  Lab Results  Component Value Date   WBC 11.6 (H) 04/13/2019   HGB 13.4 04/13/2019   HCT 39.7  04/13/2019   MCV 92.1 04/13/2019   PLT 266 04/13/2019    Lab Results  Component Value Date   NA 140 04/13/2019   K 4.0 04/13/2019   CL 105 04/13/2019   CO2 25 04/13/2019   BUN 17 04/13/2019   CREATININE 0.90 04/13/2019   GLUCOSE 112 (H) 04/13/2019   ALT 30 11/08/2020    Lab Results  Component Value Date   CHOL 170 11/08/2020   HDL 67 11/08/2020   LDLCALC 65 11/08/2020   TRIG 192 (H) 11/08/2020   CHOLHDL 2.5 11/08/2020    --------------------------------------------------------------------------------------------------  ASSESSMENT AND PLAN: Coronary artery disease and atypical angina: Ms. Mountz reports a single episode of dyspnea and potentially chest tightness that occurred while driving about 6 weeks ago.  It resolved with sublingual nitroglycerin and has not recurred.  Her physical exam today is unrevealing.  EKG shows stable findings with inferior MI.  We discussed further evaluation with myocardial perfusion stress test as well as labs to exclude PE, though the nature of her symptoms is not consistent with worsening obstructive CAD or PE.  We have agreed to defer testing for now.  If she were to have recurrent dyspnea or chest pain, I advised Ms. Dicostanzo to let us know right away or to seek immediate medical attention if it does not resolve promptly on its own or with sublingual nitroglycerin.  We will continue her current medications for secondary prevention.  COPD: Continue current therapy.  Smoking cessation encouraged.  Hyperlipidemia: LDL at goal on last check in October.  Triglycerides mildly elevated.  Continue current medications with ongoing follow-up with Dr. Debara Pickett in the lipid clinic.  Palpitations: Minimal symptoms reported since our last visit.  Continue current doses of metoprolol and diltiazem.  Carotid artery stenosis: Dopplers showed mild left and moderate right stenoses in 10/2019.  Follow-up study last fall was recommended but not completed.  We will  arrange for follow-up carotid Doppler at the patient's convenience.  Follow-up: Return to clinic in 3 months.  [name], MD 02/21/2021 3:34 PM

## 2021-02-23 ENCOUNTER — Other Ambulatory Visit: Payer: Self-pay | Admitting: Pulmonary Disease

## 2021-02-26 ENCOUNTER — Other Ambulatory Visit: Payer: Self-pay | Admitting: Nurse Practitioner

## 2021-03-08 ENCOUNTER — Telehealth: Payer: Self-pay | Admitting: Pulmonary Disease

## 2021-03-08 ENCOUNTER — Other Ambulatory Visit: Payer: Self-pay

## 2021-03-08 MED ORDER — IPRATROPIUM BROMIDE 0.02 % IN SOLN: 0.5000 mg | Freq: Four times a day (QID) | RESPIRATORY_TRACT | 3 refills | Status: DC

## 2021-03-08 NOTE — Telephone Encounter (Signed)
ATC patient--no answer with no option to leave vm. Received recording to enter assess remote code.  Will call back.

## 2021-03-08 NOTE — Telephone Encounter (Signed)
Called and spoke with patient about medication. And let her know that I did fix the order for her and that she could be able to pick up her remaining 4 boxes and that there is 3 refills on them.   Called pharmacy to verify order  Nothing further

## 2021-03-21 ENCOUNTER — Other Ambulatory Visit: Payer: Self-pay

## 2021-03-21 ENCOUNTER — Ambulatory Visit (INDEPENDENT_AMBULATORY_CARE_PROVIDER_SITE_OTHER): Payer: Medicare Other

## 2021-03-21 DIAGNOSIS — I6523 Occlusion and stenosis of bilateral carotid arteries: Secondary | ICD-10-CM

## 2021-03-22 ENCOUNTER — Telehealth: Payer: Self-pay | Admitting: *Deleted

## 2021-03-22 NOTE — Telephone Encounter (Signed)
-----   Message from [name], MD sent at 03/22/2021  1:10 PM EST ----- Thanks for the update.  Can you see if Ms. Kley wishes to follow-up sooner in light of her left arm symptoms?  We should check BP's in both arms at that time.  If there is a significant discrepancy, CTA chest/left arm would need to be considered.  Thanks.  Gerald Stabs ----- Message ----- From: Solmon Ice, RN Sent: 03/22/2021  11:40 AM EST To: [name], MD  The patient has been notified of the result and verbalized understanding.  All questions (if any) were answered. Pt states after results given, that she does notice intermittent left arm pain and numbness. Pt will continue medications and follow up as scheduled 05/22/21 and is aware to let us know of any worsening symptoms prior. Notified pt I will make Dr. Saunders Revel aware of this report since pt states she did not mention previously.  Darlyne Russian, RN 03/22/2021 11:37 AM

## 2021-03-22 NOTE — Telephone Encounter (Signed)
Spoke with pt. She does agree to follow up sooner.  Offered a couple openings with APP that pt refused d/t schedule conflict.  R/S appt from 4/19 to 3/23 with Dr. Saunders Revel.   Appt notes updated to check BP in both arms.  Pt has no further questions at this time.

## 2021-03-22 NOTE — Progress Notes (Signed)
Given report of left arm symptoms, I recommend that we try to move up her follow-up appointment so that blood pressures can be rechecked in both arms.  If there is a significant discrepancy, we will need to consider CTA of the chest/left arm to evaluate for significant subclavian artery stenosis.  [name], MD [facility]

## 2021-03-28 ENCOUNTER — Telehealth: Payer: Self-pay | Admitting: Internal Medicine

## 2021-03-28 NOTE — Telephone Encounter (Signed)
Patient states her left arm is hurting and is "numb like". Denies any other symptoms. States this started about 3:30 and states Dr. Saunders Revel told her to call when this happened. Please call to discuss.

## 2021-03-29 NOTE — Telephone Encounter (Signed)
Please also see subsequent phone note 03/22/21.   Called and spoke with pt. Pt reports continued left arm pain and numbness that occurs intermittently.   Pt describes as "starts as pain in my left shoulder, then goes through my arm, then becomes tingly." Pain now at time of call is 1/10.  Pain at its worst has been 10/10.   Pt notes pain begins usually after arm use/movement vs at rest.   Pt denies accompanying symptoms including chest pain or shortness of breath at this time.   Pt did report some SOB yesterday at time of call.   Offered a sooner appointment with Dr. Saunders Revel 04/05/21 at 3:20. (No APP appts available prior, this is first available).  Pt states she needs to check her calendar prior to scheduling to ensure she is available.   Advised pt to seek emergency room if worsening pain accompanied by chest pain, SOB, or becomes unstable.  Pt voiced understanding.   Pt states she will call back to schedule appt. (Slot held at this time)

## 2021-04-08 ENCOUNTER — Other Ambulatory Visit: Payer: Self-pay | Admitting: Nurse Practitioner

## 2021-04-25 ENCOUNTER — Encounter: Payer: Self-pay | Admitting: Internal Medicine

## 2021-04-25 ENCOUNTER — Other Ambulatory Visit: Payer: Self-pay

## 2021-04-25 ENCOUNTER — Ambulatory Visit (INDEPENDENT_AMBULATORY_CARE_PROVIDER_SITE_OTHER): Payer: Medicare Other | Admitting: Internal Medicine

## 2021-04-25 VITALS — BP 140/70 | HR 76 | Ht 63.0 in | Wt 120.0 lb

## 2021-04-25 DIAGNOSIS — I6523 Occlusion and stenosis of bilateral carotid arteries: Secondary | ICD-10-CM

## 2021-04-25 DIAGNOSIS — R002 Palpitations: Secondary | ICD-10-CM | POA: Diagnosis not present

## 2021-04-25 DIAGNOSIS — I771 Stricture of artery: Secondary | ICD-10-CM

## 2021-04-25 DIAGNOSIS — I10 Essential (primary) hypertension: Secondary | ICD-10-CM | POA: Diagnosis not present

## 2021-04-25 DIAGNOSIS — I25118 Atherosclerotic heart disease of native coronary artery with other forms of angina pectoris: Secondary | ICD-10-CM | POA: Diagnosis not present

## 2021-04-25 DIAGNOSIS — I471 Supraventricular tachycardia: Secondary | ICD-10-CM

## 2021-04-25 MED ORDER — METOPROLOL TARTRATE 50 MG PO TABS: 50.0000 mg | ORAL_TABLET | Freq: Two times a day (BID) | ORAL | 0 refills | Status: DC

## 2021-04-25 NOTE — Progress Notes (Signed)
? ?Follow-up Outpatient Visit ?Date: 04/25/2021 ? ?Primary Care Provider: ?Martin Majestic, FNP ?[place] ?[place] [address] ? ?Chief Complaint: Palpitations and left arm pain/numbness ? ?HPI:  Kimberly Kemp is a 56 y.o. 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  I last saw her in January, at which time Ms. Snare reported an episode of sudden shortness of breath and chest tightness that occurred while she was driving.  Symptoms resolved promptly with SL NTG x1.  The symptoms were different that what she experienced at the time of her cath/PCI in 2021 and were more reminiscent of her reported PE many years ago.  Given isolated episode that had occurred several weeks earlier, we agreed to defer heart/lung testing.  Carotid Doppler for follow-up of mild-moderate carotid artery stenosis last evaluated in 2021 showed mild bilateral ICA stenoses with disturbed flow in the left subclavian artery.  [name] subsequently reported intermittent pain and numbness in the left arm, leading to today's visit for further assessment of possible left subclavian artery stenosis. ? ?Today, [name] reports that she had an episode of palpitations last week.  She was sitting in the car and suddenly felt her heart slowed down.  This was then followed by a rapid heartbeat and "weird sensation" in the chest.  The whole episode lasted for few minutes and ceased abruptly while she was reaching for nitroglycerin in her pocket.  She felt a little short of breath while this was ongoing but did not have any lightheadedness or chest pain.  She has not  had any significant chest pain or dyspnea since our last visit.  She reports intermittent pain in her left arm, which comes and goes.  It often begins near the shoulder and radiates down her arm and is accompanied by numbness.  She notes that her home blood pressures are typically around 120/75, though she has seen some readings up to 160/95.  She has stable mild ankle edema. ? ?-------------------------------------------------------------------------------------------------- ? ?Cardiovascular History & Procedures: ?Cardiovascular Problems: ?Coronary artery disease s/p PCI to RCA (02/2019) ?Paroxysmal atrial fibrillation (questionable; no objective documentation in the patient's chart) ?Pulmonary embolism (questionable; no objective documentation in the patient's chart) ?Shortness of breath ?  ?Risk Factors: ?Prior stroke, hypertension, hyperlipidemia, and tobacco use ?  ?Cath/PCI: ?RHC/LHC and PCI (02/22/2019): LMCA normal.  LAD normal.  LCx normal.  Dominant RCA with 60 to 70% mid vessel stenosis.  RA 9, RV 33/9, PA 26/13, PCWP 13, Fick CO/CI 7.8/4.9.  LVEDP 15.  Attempted IFR of mid RCA complicated by guide wire dissection necessitating stenting of the ostial through distal RCA with overlapping Resolute Onyx stents (2.75 x 38 and 3.0 x 38 mm).  RCA was jailed with loss of flow. ?  ?CV Surgery: ?None ?  ?EP Procedures and Devices: ?14-day event monitor (01/05/2019): Predominantly sinus rhythm with rare PAC's and PVC's, as well as rare episodes of brief PSVT.  Patient triggered events correspond to sinus rhythm. ?  ?Non-Invasive Evaluation(s): ?Carotid Doppler (03/21/2021): 1-39% stenoses in bilateral ICA's.  <50% stenosis in both CCA's and ECA's.  Turbulent flow noted in the left subclavian artery with antegrade flow in both vertebral arteries. ?Pharmacologic MPI (11/16/2019): Low risk study  with fixed defect at the apex.  On personal review, the fixed defect involves the inferior wall as well.  No evidence of  ischemia.  LVEF 55-65%. ?Carotid Doppler (10/24/2019): Right ICA with 40-59% stenosis, left ICA with 1-39% stenosis.  Antegrade flow in both vertebral arteries.  Normal subclavian artery flow dynamics. ?TTE (02/23/2019): Normal LV size and wall thickness.  LVEF 55-60% with inferoseptal akinesis.  Normal RV size and function.  No significant valvular abnormality.  Normal PA pressure. ?Dobutamine stress echocardiogram (11/16/2018): Low risk study with normal baseline LVEF and no evidence of inducible wall motion abnormality.  LVEF 55-60%. ?Exercise tolerance test (09/20/2018): Low to intermediate risk study with decreased exercise capacity and significant motion artifact. ? ?Recent CV Pertinent Labs: ?Lab Results  ?Component Value Date  ? CHOL 170 11/08/2020  ? HDL 67 11/08/2020  ? [place] 65 11/08/2020  ? TRIG 192 (H) 11/08/2020  ? CHOLHDL 2.5 11/08/2020  ? K 4.0 04/13/2019  ? BUN 17 04/13/2019  ? CREATININE 0.90 04/13/2019  ? ? ?Past medical and surgical history were reviewed and updated in EPIC. ? ?Current Meds  ?Medication Sig  ? albuterol (PROVENTIL) (2.5 MG/3ML) 0.083% nebulizer solution TAKE 3 MLS (2.5 MG TOTAL) BY NEBULIZATION 4 TIMES DAILY AS NEEDED FOR WHEEZING SHORTNESS OF BREATH  ? albuterol (VENTOLIN HFA) 108 (90 Base) MCG/ACT inhaler INHALE 2 PUFFS INTO THE LUNGS EVERY 4 HOURS AS NEEDED FOR WHEEZE OR FOR SHORTNESS OF BREATH  ? aspirin EC 81 MG tablet Take 81 mg by mouth daily.  ? budesonide (PULMICORT) 0.25 MG/2ML nebulizer solution USE 2ML (0.25 MG TOTAL) BY NEBULIZATION TWICE A DAY  ? butalbital-acetaminophen-caffeine (FIORICET, ESGIC) 50-325-40 MG tablet Take 1 tablet by mouth 3 (three) times daily as needed for headache.  ? diltiazem (CARDIZEM CD) 180 MG 24 hr capsule TAKE 1 CAPSULE BY MOUTH EVERY DAY  ? ezetimibe (ZETIA) 10 MG tablet Take 1 tablet (10 mg total) by mouth daily.  ? famotidine (PEPCID) 40 MG tablet Take 40 mg by mouth at bedtime.  ? gabapentin (NEURONTIN) 300 MG capsule Take 300 mg by  mouth at bedtime.  ? HYDROcodone bit-homatropine (HYCODAN) 5-1.5 MG/5ML syrup Take 5 mLs by mouth every 6 (six) hours as needed for cough.  ? ipratropium (ATROVENT) 0.02 % nebulizer solution Take 2.5 mLs (0.5 mg total) by nebulization 4 (four) times daily.  ? magic mouthwash w/lidocaine SOLN Take 10 mLs by mouth 4 (four) times daily as needed for mouth pain.  ? metoprolol tartrate (LOPRESSOR) 25 MG tablet TAKE 1 & 1/2 TABLETS (37.5 MG TOTAL) BY MOUTH 2 (TWO) TIMES DAILY.  ? montelukast (SINGULAIR) 10 MG tablet Take 10 mg by mouth daily.  ? nitroGLYCERIN (NITROSTAT) 0.4 MG SL tablet PLACE 1 TABLET (0.4 MG TOTAL) UNDER THE TONGUE EVERY 5 (FIVE) MINUTES AS NEEDED FOR CHEST PAIN. MAXIMUM OF 3 DOSES.  ? Omega-3 Fatty Acids (FISH OIL PO) Take 1 capsule by mouth 2 (two) times daily.  ? omeprazole (PRILOSEC) 40 MG capsule TAKE 1 CAPSULE BY MOUTH EVERY DAY  ? PAZEO 0.7 % SOLN Place 1 drop into both eyes daily.  ? pramipexole (MIRAPEX) 1 MG tablet Take 1 mg by mouth at bedtime.  ? rosuvastatin (CRESTOR) 40 MG tablet TAKE 1 TABLET BY MOUTH EVERY DAY  ? ? ?Allergies: Penicillins, Omnipaque [iohexol], and Red dye ? ?Social History  ? ?Tobacco Use  ? Smoking status: Every Day  ?  Packs/day: 0.20  ?  Years: 43.00  ?  Pack years: 8.60  ?  Types: Cigarettes  ? Smokeless tobacco: Never  ? Tobacco comments:  ?  3 per day  ?Vaping Use  ? Vaping Use: Never used  ?Substance Use Topics  ? Alcohol use: No  ?  Alcohol/week: 0.0 standard drinks  ? Drug use: No  ? ? ?Family History  ?Problem Relation Age of Onset  ? Emphysema Father   ? Prostate cancer Father   ? Liver disease Father   ? Emphysema Maternal Grandmother   ? Diabetes Mother   ? Heart disease Mother 57  ?     CABG and valve replacement  ? Colon cancer Neg Hx   ? ? ?Review of Systems: ?A 12-system review of systems was performed and was negative except as noted in the  HPI. ? ?-------------------------------------------------------------------------------------------------- ? ?Physical Exam: ?BP 140/70 (BP Location: Right Arm, Patient Position: Sitting, Cuff Size: Normal)   Pulse 76   Ht '5\' 3"'$  (1.6 m)   Wt 120 lb (54.4 kg)   LMP 07/18/2016   SpO2 98%   BMI 21.26 kg/m?  ? ?General:  NAD. ?N

## 2021-04-25 NOTE — Patient Instructions (Signed)
Medication Instructions:  ? ?Your physician has recommended you make the following change in your medication:  ? ?INCREASE Metoprolol Tartrate 50 mg TWICE daily - A new Rx has been sent to your pharmacy ? ?*If you need a refill on your cardiac medications before your next appointment, please call your pharmacy* ? ? ?Lab Work: ? ?None ordered ? ?Testing/Procedures: ? ?None ordered ? ? ?Follow-Up: ?At [facility], you and your health needs are our priority.  As part of our continuing mission to provide you with exceptional heart care, we have created designated Provider Care Teams.  These Care Teams include your primary Cardiologist (physician) and Advanced Practice Providers (APPs -  Physician Assistants and Nurse Practitioners) who all work together to provide you with the care you need, when you need it. ? ?We recommend signing up for the patient portal called "MyChart".  Sign up information is provided on this After Visit Summary.  MyChart is used to connect with patients for Virtual Visits (Telemedicine).  Patients are able to view lab/test results, encounter notes, upcoming appointments, etc.  Non-urgent messages can be sent to your provider as well.   ?To learn more about what you can do with MyChart, go to [email or web address].   ? ?Your next appointment:   ?3 month(s) ? ?The format for your next appointment:   ?In Person ? ?Provider:   ?You may see [name], MD or one of the following Advanced Practice Providers on your designated Care Team:   ?Murray Hodgkins, NP ?Christell Faith, PA-C ?Cadence Kathlen Mody, PA-C ?

## 2021-04-30 ENCOUNTER — Other Ambulatory Visit: Payer: Self-pay | Admitting: Internal Medicine

## 2021-05-07 ENCOUNTER — Other Ambulatory Visit: Payer: Self-pay | Admitting: Family Medicine

## 2021-05-20 ENCOUNTER — Other Ambulatory Visit: Payer: Self-pay | Admitting: Pulmonary Disease

## 2021-05-20 DIAGNOSIS — J441 Chronic obstructive pulmonary disease with (acute) exacerbation: Secondary | ICD-10-CM

## 2021-05-20 DIAGNOSIS — J432 Centrilobular emphysema: Secondary | ICD-10-CM

## 2021-05-22 ENCOUNTER — Ambulatory Visit: Payer: Medicare Other | Admitting: Internal Medicine

## 2021-05-26 ENCOUNTER — Emergency Department: Payer: Medicare Other

## 2021-05-26 ENCOUNTER — Other Ambulatory Visit: Payer: Self-pay

## 2021-05-26 ENCOUNTER — Emergency Department
Admission: EM | Admit: 2021-05-26 | Discharge: 2021-05-26 | Disposition: A | Discharge status: Home / Self Care | Payer: Medicare Other | Attending: Emergency Medicine | Admitting: Emergency Medicine

## 2021-05-26 DIAGNOSIS — I2510 Atherosclerotic heart disease of native coronary artery without angina pectoris: Secondary | ICD-10-CM | POA: Diagnosis not present

## 2021-05-26 DIAGNOSIS — J441 Chronic obstructive pulmonary disease with (acute) exacerbation: Secondary | ICD-10-CM | POA: Diagnosis not present

## 2021-05-26 DIAGNOSIS — R0602 Shortness of breath: Secondary | ICD-10-CM | POA: Diagnosis present

## 2021-05-26 DIAGNOSIS — F17200 Nicotine dependence, unspecified, uncomplicated: Secondary | ICD-10-CM | POA: Insufficient documentation

## 2021-05-26 LAB — CBC
HCT: 42.8 % (ref 36.0–46.0)
Hemoglobin: 14.1 g/dL (ref 12.0–15.0)
MCH: 30.3 pg (ref 26.0–34.0)
MCHC: 32.9 g/dL (ref 30.0–36.0)
MCV: 92 fL (ref 80.0–100.0)
Platelets: 236 10*3/uL (ref 150–400)
RBC: 4.65 MIL/uL (ref 3.87–5.11)
RDW: 13.7 % (ref 11.5–15.5)
WBC: 8.7 10*3/uL (ref 4.0–10.5)
nRBC: 0 % (ref 0.0–0.2)

## 2021-05-26 LAB — BASIC METABOLIC PANEL
Anion gap: 5 (ref 5–15)
BUN: 18 mg/dL (ref 6–20)
CO2: 23 mmol/L (ref 22–32)
Calcium: 9.7 mg/dL (ref 8.9–10.3)
Chloride: 113 mmol/L — ABNORMAL HIGH (ref 98–111)
Creatinine, Ser: 1.02 mg/dL — ABNORMAL HIGH (ref 0.44–1.00)
GFR, Estimated: >60 mL/min (ref >60)
Glucose, Bld: 104 mg/dL — ABNORMAL HIGH (ref 70–99)
Potassium: 3.9 mmol/L (ref 3.5–5.1)
Sodium: 141 mmol/L (ref 135–145)

## 2021-05-26 LAB — TROPONIN I (HIGH SENSITIVITY): Troponin I (High Sensitivity): 5 ng/L (ref <18)

## 2021-05-26 MED ORDER — PREDNISONE 50 MG PO TABS: 50.0000 mg | ORAL_TABLET | Freq: Every day | ORAL | 0 refills | Status: DC

## 2021-05-26 NOTE — ED Notes (Addendum)
See triage note, pt reports shob x2 months. +cough. Denies cp.  ?Hx COPD, +smoker ?NAD noted  ?

## 2021-05-26 NOTE — ED Triage Notes (Addendum)
Pt comes with c/o SOB for last few days. Pt states increased wheezing. Pt states some CP. ? ?Pt states dry nonproductive cough. ?

## 2021-05-26 NOTE — ED Provider Notes (Signed)
? ?[facility] ?Provider Note ? ? ? Event Date/Time  ? First MD Initiated Contact with Patient 05/26/21 1452   ?  (approximate) ? ? ?History  ? ?Shortness of Breath ? ? ?HPI ? ?Kimberly Kemp is a 56 y.o. female with a history of COPD, CAD who presents with shortness of breath.  Patient reports over the last 2 months she has had wheezing sensation that seems to happen overnight only.  She has used her inhalers and nebulizers as prescribed without significant improvement.  She also reports she developed nasal congestion around the same time.  Her PCP is treated for allergies but she says that has not helped.  She denies chest pain.  No calf pain or swelling.  No pleurisy.  Currently feels well.  Does continue to smoke. ?  ? ? ?Physical Exam  ? ?Triage Vital Signs: ?ED Triage Vitals  ?Enc Vitals Group  ?   BP 05/26/21 1257 (!) 145/74  ?   Pulse Rate 05/26/21 1257 74  ?   Resp 05/26/21 1257 20  ?   Temp 05/26/21 1257 98 ?F (36.7 ?C)  ?   Temp src --   ?   SpO2 05/26/21 1257 96 %  ?   Weight --   ?   Height --   ?   Head Circumference --   ?   Peak Flow --   ?   Pain Score 05/26/21 1300 0  ?   Pain Loc --   ?   Pain Edu? --   ?   Excl. in [place]? --   ? ? ?Most recent vital signs: ?Vitals:  ? 05/26/21 1444 05/26/21 1446  ?BP:    ?Pulse: 68 64  ?Resp: 17 19  ?Temp:    ?SpO2: 100% 100%  ? ? ? ?General: Awake, no distress.  ?CV:  Good peripheral perfusion.  ?Resp:  Normal effort.  Scattered mild wheezes ?Abd:  No distention.  ?Other:  No calf pain or swelling ? ? ?ED Results / Procedures / Treatments  ? ?Labs ?(all labs ordered are listed, but only abnormal results are displayed) ?Labs Reviewed  ?BASIC METABOLIC PANEL - Abnormal; Notable for the following components:  ?    Result Value  ? Chloride 113 (*)   ? Glucose, Bld 104 (*)   ? Creatinine, Ser 1.02 (*)   ? All other components within normal limits  ?CBC  ?TROPONIN I (HIGH SENSITIVITY)  ? ? ? ?EKG ? ?ED ECG REPORT ?I, Lavonia Drafts, the attending  physician, personally viewed and interpreted this ECG. ? ?Date: 05/26/2021 ? ?Rhythm: normal sinus rhythm ?QRS Axis: Left axis ?Intervals: normal ?ST/T Wave abnormalities: normal ?Narrative Interpretation: no evidence of acute ischemia ? ? ? ?RADIOLOGY ?Chest x-ray viewed interpreted by me, no acute abnormality ? ? ? ?PROCEDURES: ? ?Critical Care performed:  ? ?Procedures ? ? ?MEDICATIONS ORDERED IN ED: ?Medications - No data to display ? ? ?IMPRESSION / MDM / ASSESSMENT AND PLAN / ED COURSE  ?I reviewed the triage vital signs and the nursing notes. ? ? ?Patient presents with nighttime wheezing, overall feels okay at this time, is frustrated that has been going on for 2 months.  She feels like she has a cold for 2 months.  She has scattered mild wheezing on exam, she does continue to smoke.  Suspicious for bronchospasm ? ?Lab work today is reassuring, CBC BMP unremarkable, high sensitive troponin is normal.  EKG unchanged from prior.  Chest  x-ray without evidence of pneumonia. ? ?We will treat the patient with 5-day burst of steroids, recommend albuterol nebulizer every 4-6 hours while awake over the next several days.  Follow-up with her pulmonologist. ? ? ? ? ?  ? ? ?FINAL CLINICAL IMPRESSION(S) / ED DIAGNOSES  ? ?Final diagnoses:  ?COPD exacerbation ([place])  ? ? ? ?Rx / DC Orders  ? ?ED Discharge Orders   ? ?      Ordered  ?  predniSONE (DELTASONE) 50 MG tablet  Daily with breakfast       ? 05/26/21 1511  ? ?  ?  ? ?  ? ? ? ?Note:  This document was prepared using Dragon voice recognition software and may include unintentional dictation errors. ?  ?Lavonia Drafts, MD ?05/26/21 1515 ? ?

## 2021-05-27 ENCOUNTER — Telehealth: Payer: Self-pay | Admitting: Pulmonary Disease

## 2021-05-27 NOTE — Telephone Encounter (Signed)
Spoke with the pt  ?She is c/o increased SOB, wheezing, congestion  ?She was seen in ED yesterday and given pred taper  ?She feels not improving  ?No fever  ?OV with LG in [place] 05/30/21 ?Advised seek emergent care sooner if needed ?

## 2021-05-30 ENCOUNTER — Ambulatory Visit (INDEPENDENT_AMBULATORY_CARE_PROVIDER_SITE_OTHER): Payer: Medicare Other | Admitting: Pulmonary Disease

## 2021-05-30 ENCOUNTER — Encounter: Payer: Self-pay | Admitting: Pulmonary Disease

## 2021-05-30 VITALS — BP 122/62 | HR 71 | Temp 97.8°F | Ht 63.0 in | Wt 120.4 lb

## 2021-05-30 DIAGNOSIS — J441 Chronic obstructive pulmonary disease with (acute) exacerbation: Secondary | ICD-10-CM

## 2021-05-30 DIAGNOSIS — F17210 Nicotine dependence, cigarettes, uncomplicated: Secondary | ICD-10-CM | POA: Diagnosis not present

## 2021-05-30 MED ORDER — METHYLPREDNISOLONE 4 MG PO TBPK: ORAL_TABLET | ORAL | 0 refills | Status: DC

## 2021-05-30 MED ORDER — DOXYCYCLINE MONOHYDRATE 100 MG PO CAPS: 100.0000 mg | ORAL_CAPSULE | Freq: Two times a day (BID) | ORAL | 0 refills | Status: DC

## 2021-05-30 NOTE — Progress Notes (Signed)
Subjective:    Patient ID: Kimberly Kemp, female    DOB: 05-Apr-1965, 56 y.o.   MRN: [id number] Patient Care Team: Girtha Rm Eli Phillips, MD as PCP - General (Family Medicine) End, Cristal Deer, MD as PCP - Cardiology (Cardiology)  Chief Complaint  Patient presents with   Follow-up    ED visit 05/26/2021- tx with prednisone. C/o sore throat, voice hoariness, chest congestion, dry cough, wheezing and increased SOB.     HPI The patient is a 56 year old current smoker, patient of Dr. Spero Curb with known issues with COPD and CAD who presents for follow-up after a recent ED visit on 26 May 2021.  She was seen because of a 56-month history of wheezing which occurs mostly overnight and increased use of inhalers and nebulizers without improvement on the symptoms.  She also had developed nasal congestion and chest congestion.  She has had sore throat a dry nonproductive cough and increased shortness of breath over her baseline.  She smokes approximately a half a pack of cigarettes per day but during this episode she has cut back to 5 cigarettes/day.  She has not had any chest pain, no lower extremity edema or calf tenderness.  No pleuritic symptoms.  She received a short burst of prednisone through the ED and presents today as an acute visit because she is no better.  She has not had any fevers, chills or sweats.  No hemoptysis.  She is maintained on albuterol/Atrovent nebulizers and budesonide.  She does not endorse any other symptomatology.  Review of Systems A 10 point review of systems was performed and it is as noted above otherwise negative.  Patient Active Problem List   Diagnosis Date Noted   Stenosis of left subclavian artery 04/25/2021   Bilateral carotid artery stenosis 04/27/2020   PSVT (paroxysmal supraventricular tachycardia) 05/12/2019   Familial hypercholesterolemia 03/02/2019   Unstable angina 02/22/2019   Coronary artery disease of native artery of native heart with stable angina  pectoris 02/22/2019   Shortness of breath 09/17/2018   Palpitations 09/17/2018   Right lower lobe pneumonia 06/11/2018   Generalized anxiety disorder 10/10/2015   Essential hypertension 09/22/2015   History of stroke 09/22/2015   Leukocytosis 09/22/2015   Acute respiratory failure    Mixed hyperlipidemia    Chronic obstructive pulmonary disease 09/04/2015   Acute bronchitis 04/04/2015   Hemoptysis 06/30/2014   Thrush 07/22/2013   Chest pain 04/27/2013   GERD (gastroesophageal reflux disease) 06/18/2012   Chronic cough 06/18/2012   EMPHYSEMA 05/27/2007   Tobacco abuse 12/11/2006   Social History   Tobacco Use   Smoking status: Every Day    Packs/day: 0.50    Years: 43.00    Additional pack years: 0.00    Total pack years: 21.50    Types: Cigarettes   Smokeless tobacco: Never   Tobacco comments:    5 per day-06/06/2021  Substance Use Topics   Alcohol use: No    Alcohol/week: 0.0 standard drinks of alcohol   Allergies  Allergen Reactions   Penicillins Hives    Did it involve swelling of the face/tongue/throat, SOB, or low BP? No Did it involve sudden or severe rash/hives, skin peeling, or any reaction on the inside of your mouth or nose? Yes Did you need to seek medical attention at a hospital or doctor's office? Yes When did it last happen?      10 Years If all above answers are "NO", may proceed with cephalosporin use.    Omnipaque [Iohexol]  Itching        Red Dye Itching   Current Meds  Medication Sig   aspirin EC 81 MG tablet Take 81 mg by mouth daily.   butalbital-acetaminophen-caffeine (FIORICET, ESGIC) 50-325-40 MG tablet Take 1 tablet by mouth 3 (three) times daily as needed for headache.   famotidine (PEPCID) 40 MG tablet Take 40 mg by mouth at bedtime.   gabapentin (NEURONTIN) 300 MG capsule Take 300 mg by mouth at bedtime.   magic mouthwash w/lidocaine SOLN Take 10 mLs by mouth 4 (four) times daily as needed for mouth pain.   montelukast (SINGULAIR) 10  MG tablet Take 10 mg by mouth daily.   Omega-3 Fatty Acids (FISH OIL PO) Take 1 capsule by mouth 2 (two) times daily.   omeprazole (PRILOSEC) 40 MG capsule TAKE 1 CAPSULE BY MOUTH EVERY DAY   PAZEO 0.7 % SOLN Place 1 drop into both eyes daily.   pramipexole (MIRAPEX) 1 MG tablet Take 1 mg by mouth at bedtime.   albuterol (PROVENTIL) (2.5 MG/3ML) 0.083% nebulizer solution TAKE 3 MLS (2.5 MG TOTAL) BY NEBULIZATION 4 TIMES DAILY AS NEEDED FOR WHEEZING SHORTNESS OF BREATH   albuterol (VENTOLIN HFA) 108 (90 Base) MCG/ACT inhaler INHALE 2 PUFFS INTO THE LUNGS EVERY 4 HOURS AS NEEDED FOR WHEEZE OR FOR SHORTNESS OF BREATH   budesonide (PULMICORT) 0.25 MG/2ML nebulizer solution USE (0.25 MG TOTAL) BY NEBULIZATION TWICE A DAY   diltiazem (CARDIZEM CD) 180 MG 24 hr capsule TAKE 1 CAPSULE BY MOUTH EVERY DAY   ezetimibe (ZETIA) 10 MG tablet Take 1 tablet (10 mg total) by mouth daily.   HYDROcodone bit-homatropine (HYCODAN) 5-1.5 MG/5ML syrup Take 5 mLs by mouth every 6 (six) hours as needed for cough.   ipratropium (ATROVENT) 0.02 % nebulizer solution Take 2.5 mLs (0.5 mg total) by nebulization 4 (four) times daily.   metoprolol tartrate (LOPRESSOR) 50 MG tablet Take 1 tablet (50 mg total) by mouth 2 (two) times daily.   nitroGLYCERIN (NITROSTAT) 0.4 MG SL tablet PLACE 1 TABLET (0.4 MG TOTAL) UNDER THE TONGUE EVERY 5 (FIVE) MINUTES AS NEEDED FOR CHEST PAIN. MAXIMUM OF 3 DOSES.   rosuvastatin (CRESTOR) 40 MG tablet TAKE 1 TABLET BY MOUTH EVERY DAY   Immunization History  Administered Date(s) Administered   Influenza Split 11/05/2011   Influenza,inj,Quad PF,6+ Mos 11/03/2012   Pneumococcal Conjugate-13 10/06/2017   Pneumococcal Polysaccharide-23 11/04/2003, 01/30/2004   Tdap 01/18/2018       Objective:   Physical Exam BP 122/62 (BP Location: Left Arm, Cuff Size: Normal)   Pulse 71   Temp 97.8 F (36.6 C) (Temporal)   Ht  (1.6 m)   Wt 120 lb 6.4 oz (54.6 kg)   LMP 07/18/2016   SpO2 99%    BMI 21.33 kg/m   SpO2: 99 % O2 Device: None (Room air)  GENERAL: Well-developed, well-nourished woman, no acute distress HEAD: Normocephalic, atraumatic.  EYES: Pupils equal, round, reactive to light.  No scleral icterus.  MOUTH: Poor dentition, oral mucosa moist. NECK: Supple. No thyromegaly. Trachea midline. No JVD.  No adenopathy. PULMONARY: Good air entry bilaterally.  Coarse, some end expiratory wheezes. CARDIOVASCULAR: S1 and S2. Regular rate and rhythm.  ABDOMEN: Benign. MUSCULOSKELETAL: No joint deformity, no clubbing, no edema.  NEUROLOGIC: No overt focal deficit. SKIN: Intact,warm,dry. PSYCH: Behavior normal.     Assessment & Plan:     ICD-10-CM   1. COPD exacerbation (HCC)  J44.1    Medrol Dosepak Doxycycline 100 mg twice  daily x 7 days Delsym as needed cough Follow-up with NP or Dr. Craige Cotta in 3 to 4-weeks    2. Cigarette smoker  F17.210    Counseled regards discontinuation of smoking Total counseling time 3 to 5 minutes     Meds ordered this encounter  Medications   methylPREDNISolone (MEDROL DOSEPAK) 4 MG TBPK tablet    Sig: Take as directed in the package.  This is a taper pack.    Dispense:  21 tablet    Refill:  0   doxycycline (MONODOX) 100 MG capsule    Sig: Take 1 capsule (100 mg total) by mouth 2 (two) times daily.    Dispense:  14 capsule    Refill:  0   Patient will follow-up with either our nurse practitioners or Dr. Craige Cotta in 3 to 4 weeks.  She is to Contact us prior to that time should any new difficulties arise.  Gailen Shelter, MD Advanced Bronchoscopy PCCM [facility]    *This note was dictated using voice recognition software/Dragon.  Despite best efforts to proofread, errors can occur which can change the meaning. Any transcriptional errors that result from this process are unintentional and may not be fully corrected at the time of dictation.

## 2021-05-30 NOTE — Patient Instructions (Signed)
For your cough take some Delsym over-the-counter. ? ?We have sent in a prescription for an antibiotic and for a Medrol Dosepak. ? ?We will have you come back and see you either Dr. Halford Chessman or one of our nurse practitioners in 3 to 4 weeks. ?

## 2021-06-03 ENCOUNTER — Telehealth: Payer: Self-pay | Admitting: Pulmonary Disease

## 2021-06-03 MED ORDER — HYDROCODONE BIT-HOMATROP MBR 5-1.5 MG/5ML PO SOLN: 5.0000 mL | Freq: Four times a day (QID) | ORAL | 0 refills | Status: DC | PRN

## 2021-06-03 NOTE — Telephone Encounter (Signed)
Kimberly Kemp is not listed on DPR.  ?Received verbal from patient to speak with [name].  ?Spoke to [employer]. She is requesting a refill on Hycodan. Last refilled 02/08/2021 #[id number]. ?Patient seen for acute visit 05/30/2021. She was given doxy and medrol. She will complete course in 2 days. ?C/o voice hoariness, dry cough, wheezing and increased SOB.  ?She is doing albuterol neb treatments Q4H.  ?She has tried Delsym without relief. ? ?Dr. Halford Chessman, please advise. Thanks  ? ?

## 2021-06-03 NOTE — Telephone Encounter (Signed)
pt daughter is calling requesting an refill on her rx.pt is up throughout the night coughing, which is getting worse and hurting her chest. and is currently out of cough medication (pt doesnt know which medication it is) and Donnald Garre personally went over a few and shes not sure of which medication it is, but states its not the hydrocodone solution ? ?[facility] in [place] on [address] ?

## 2021-06-03 NOTE — Telephone Encounter (Signed)
Refill for hycodan sent. ?

## 2021-06-03 NOTE — Telephone Encounter (Signed)
Per Dr. Halford Chessman via epic secure chat--he will send Hycodan refill. ? ?Lm for patient's daughter, Andee Poles.  ?

## 2021-06-04 ENCOUNTER — Ambulatory Visit: Payer: Medicare Other | Attending: Acute Care

## 2021-06-04 NOTE — Telephone Encounter (Signed)
Lm x2 for patient's daughter, Andee Poles. ?Will close encounter per office protocol.  ? ?

## 2021-06-06 ENCOUNTER — Ambulatory Visit: Payer: Medicare Other | Admitting: Pulmonary Disease

## 2021-06-06 ENCOUNTER — Encounter: Payer: Self-pay | Admitting: Pulmonary Disease

## 2021-06-06 ENCOUNTER — Ambulatory Visit (INDEPENDENT_AMBULATORY_CARE_PROVIDER_SITE_OTHER): Payer: Medicare Other | Admitting: Pulmonary Disease

## 2021-06-06 VITALS — BP 138/60 | HR 63 | Temp 97.8°F | Ht 63.0 in | Wt 121.0 lb

## 2021-06-06 DIAGNOSIS — J441 Chronic obstructive pulmonary disease with (acute) exacerbation: Secondary | ICD-10-CM | POA: Diagnosis not present

## 2021-06-06 DIAGNOSIS — B370 Candidal stomatitis: Secondary | ICD-10-CM | POA: Diagnosis not present

## 2021-06-06 DIAGNOSIS — Z720 Tobacco use: Secondary | ICD-10-CM | POA: Diagnosis not present

## 2021-06-06 DIAGNOSIS — J432 Centrilobular emphysema: Secondary | ICD-10-CM | POA: Diagnosis not present

## 2021-06-06 MED ORDER — NYSTATIN 100000 UNIT/ML MT SUSP: 5.0000 mL | Freq: Four times a day (QID) | OROMUCOSAL | 0 refills | Status: DC

## 2021-06-06 MED ORDER — LEVOFLOXACIN 500 MG PO TABS: 500.0000 mg | ORAL_TABLET | Freq: Every day | ORAL | 0 refills | Status: DC

## 2021-06-06 NOTE — Patient Instructions (Signed)
Levaquin 500 mg daily for 7 days ? ?Nystatin 5 ml swish and swallow 4 times per day for 5 days ? ?Follow up in 3 months ?

## 2021-06-06 NOTE — Progress Notes (Signed)
? ?[facility] ? ?Chief Complaint  ?Patient presents with  ? Follow-up  ?  Completed course of doxy and pred yesterday with mild improvement in sx. C/o wheezing at night and dry cough at times prod with yellow sputum.   ? ? ?Constitutional:  ?BP 138/60 (BP Location: Left Arm, Cuff Size: Normal)   Pulse 63   Temp 97.8 ?F (36.6 ?C) (Temporal)   Ht '5\' 3"'$  (1.6 m)   Wt 121 lb (54.9 kg)   LMP 07/18/2016   SpO2 100%   BMI 21.43 kg/m?  ? ?Past Medical History:  ?CVA, PE, PAF, HTN, HLD, Hep B, GERD, Depression, Cirrhosis, Colon cancer, chronic pain, Anxiety, CAD s/p stent ? ?Past Surgical History:  ?Her  has a past surgical history that includes Bronchoscopy (March 2008, Feb 2012); Cesarean section; Appendectomy; RIGHT/LEFT HEART CATH AND CORONARY ANGIOGRAPHY (N/A, 02/22/2019); INTRAVASCULAR PRESSURE WIRE/FFR STUDY (N/A, 02/22/2019); CORONARY STENT INTERVENTION (N/A, 02/22/2019); and Cardiac catheterization. ? ?Brief Summary:  ?Kimberly Kemp is a 55 y.o. 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 ?  ? ? ? ?Subjective:  ? ?She was seen in ER on 05/26/21 with increased dyspnea, cough, and wheeze.  Also had nasal congestion.  Treated with prednisone and nebulizer therapy.  Chest xray showed hyperinflation.  CBC was normal.   ? ?She continues to have cough with yellow, thick sputum.  Still gets intermittent wheeze.  No fever.  Sinus okay.  Has burning feeling in her mouth and when she swallows. ? ?She is down to 5 cigarettes per day. ? ?Physical Exam:  ? ?Appearance - well kempt  ? ?ENMT - no sinus tenderness, no oral exudate, no LAN, Mallampati 3 airway, no stridor, changes of thrush ? ?Respiratory - equal breath sounds bilaterally, no wheezing or rales ? ?CV - s1s2 regular rate and rhythm, no murmurs ? ?Ext - no clubbing, no edema ? ?Skin - no rashes ? ?Psych - normal mood and affect ? ?  ?Pulmonary testing:  ?A1AT 09/04/06 >> 236 ?Spirometry 10/26/09>>FEV1 2.59(88%), FEV1% 73 ?RAST  01/22/10 >> negative, IgE 11.9 ?Labs 01/22/10 >> ACE 46, RF < 20, ANA negative, ANCA negative ?PFT 07/14/18 >> FEV1 2.08 (77%), FEV1% 65, TLC 4.11 (83%), DLCO 70% ? ?Chest Imaging:  ?CT chest 06/26/10 >> upper lobe emphysema ?CT chest 06/10/13 >> minimal centrilobular emphysema ?CT chest 07/13/18 >> moderate to advanced emphysema ?LDCT chest 05/28/20 >> moderate centrilobular and paraseptal emphysema ? ?Sleep Tests:  ?HST 09/13/10 >> AHI 1, SpO2 low 91% ? ?Cardiac Tests:  ?Echo 04/15/06 >> EF 55 to 65% ?LHC 02/22/19 >> RCA 70% occlusion, RCA dissection, DES ?Echo 02/23/19 >> EF 55 to 60% ? ?Social History:  ?She  reports that she has been smoking cigarettes. She has a 21.50 pack-year smoking history. She has never used smokeless tobacco. She reports that she does not drink alcohol and does not use drugs. ? ?Family History:  ?Her family history includes Diabetes in her mother; Emphysema in her father and maternal grandmother; Heart disease (age of onset: 51) in her mother; Liver disease in her father; Prostate cancer in her father. ?  ? ? ?Assessment/Plan:  ? ?COPD with emphysema. ?- she has persistent exacerbation ?- don't think she needs additional prednisone ?- will give her course of levaquin ?- continue pulmicort, albuterol, ipratropium in nebulizer ?- continue singulair ?- prn hycodan for cough during exacerbations ? ?Thrush. ?- will give course of nystatin ?- emphasized importance of rinsing her mouth thoroughly  after using pulmicort ? ?Tobacco abuse. ?- she will gradually try to cut down on smoking; encouraged her to keep up with her progress ?- she has low dose CT chest scheduled for later this month ? ?CAD s/p DES. ?- followed by Dr. Harrell Gave End with [place] ? ? ?Time Spent Involved in Patient Care on Day of Examination:  ?36 minutes ? ?Follow up:  ? ?Patient Instructions  ?Levaquin 500 mg daily for 7 days ? ?Nystatin 5 ml swish and swallow 4 times per day for 5 days ? ?Follow up in 3  months ? ?Medication List:  ? ?Allergies as of 06/06/2021   ? ?   Reactions  ? Penicillins Hives  ? Did it involve swelling of the face/tongue/throat, SOB, or low BP? No ?Did it involve sudden or severe rash/hives, skin peeling, or any reaction on the inside of your mouth or nose? Yes ?Did you need to seek medical attention at a hospital or doctor's office? Yes ?When did it last happen?      10 Years ?If all above answers are ?NO?, may proceed with cephalosporin use.  ? Omnipaque [iohexol] Itching  ?   ? Red Dye Itching  ? ?  ? ?  ?Medication List  ?  ? ?  ? Accurate as of Jun 06, 2021  9:11 AM. If you have any questions, ask your nurse or doctor.  ?  ?  ? ?  ? ?STOP taking these medications   ? ?doxycycline 100 MG capsule ?Commonly known as: MONODOX ?Stopped by: Chesley Mires, MD ?  ?methylPREDNISolone 4 MG Tbpk tablet ?Commonly known as: MEDROL DOSEPAK ?Stopped by: Chesley Mires, MD ?  ? ?  ? ?TAKE these medications   ? ?albuterol 108 (90 Base) MCG/ACT inhaler ?Commonly known as: VENTOLIN HFA ?INHALE 2 PUFFS INTO THE LUNGS EVERY 4 HOURS AS NEEDED FOR WHEEZE OR FOR SHORTNESS OF BREATH ?  ?albuterol (2.5 MG/3ML) 0.083% nebulizer solution ?Commonly known as: PROVENTIL ?TAKE 3 MLS (2.5 MG TOTAL) BY NEBULIZATION 4 TIMES DAILY AS NEEDED FOR WHEEZING SHORTNESS OF BREATH ?  ?aspirin EC 81 MG tablet ?Take 81 mg by mouth daily. ?  ?budesonide 0.25 MG/2ML nebulizer solution ?Commonly known as: PULMICORT ?USE 2ML (0.25 MG TOTAL) BY NEBULIZATION TWICE A DAY ?  ?butalbital-acetaminophen-caffeine 50-325-40 MG tablet ?Commonly known as: FIORICET ?Take 1 tablet by mouth 3 (three) times daily as needed for headache. ?  ?diltiazem 180 MG 24 hr capsule ?Commonly known as: CARDIZEM CD ?TAKE 1 CAPSULE BY MOUTH EVERY DAY ?  ?ezetimibe 10 MG tablet ?Commonly known as: ZETIA ?Take 1 tablet (10 mg total) by mouth daily. ?  ?famotidine 40 MG tablet ?Commonly known as: PEPCID ?Take 40 mg by mouth at bedtime. ?  ?FISH OIL PO ?Take 1 capsule by mouth  2 (two) times daily. ?  ?gabapentin 300 MG capsule ?Commonly known as: NEURONTIN ?Take 300 mg by mouth at bedtime. ?  ?HYDROcodone bit-homatropine 5-1.5 MG/5ML syrup ?Commonly known as: HYCODAN ?Take 5 mLs by mouth every 6 (six) hours as needed for cough. ?  ?ipratropium 0.02 % nebulizer solution ?Commonly known as: ATROVENT ?Take 2.5 mLs (0.5 mg total) by nebulization 4 (four) times daily. ?  ?levofloxacin 500 MG tablet ?Commonly known as: LEVAQUIN ?Take 1 tablet (500 mg total) by mouth daily. ?Started by: Chesley Mires, MD ?  ?magic mouthwash w/lidocaine Soln ?Take 10 mLs by mouth 4 (four) times daily as needed for mouth pain. ?  ?metoprolol tartrate 50 MG  tablet ?Commonly known as: LOPRESSOR ?Take 1 tablet (50 mg total) by mouth 2 (two) times daily. ?  ?montelukast 10 MG tablet ?Commonly known as: SINGULAIR ?Take 10 mg by mouth daily. ?  ?nitroGLYCERIN 0.4 MG SL tablet ?Commonly known as: NITROSTAT ?PLACE 1 TABLET (0.4 MG TOTAL) UNDER THE TONGUE EVERY 5 (FIVE) MINUTES AS NEEDED FOR CHEST PAIN. MAXIMUM OF 3 DOSES. ?  ?nystatin 100000 UNIT/ML suspension ?Commonly known as: MYCOSTATIN ?Take 5 mLs (500,000 Units total) by mouth 4 (four) times daily. ?Started by: Chesley Mires, MD ?  ?omeprazole 40 MG capsule ?Commonly known as: PRILOSEC ?TAKE 1 CAPSULE BY MOUTH EVERY DAY ?  ?Pazeo 0.7 % Soln ?Generic drug: Olopatadine HCl ?Place 1 drop into both eyes daily. ?  ?pramipexole 1 MG tablet ?Commonly known as: MIRAPEX ?Take 1 mg by mouth at bedtime. ?  ?rosuvastatin 40 MG tablet ?Commonly known as: CRESTOR ?TAKE 1 TABLET BY MOUTH EVERY DAY ?  ? ?  ? ? ?Signature:  ?Chesley Mires, MD ?[place] ?Pager - [phone] - [phone] ?06/06/2021, 9:11 AM ?  ? ? ? ? ? ? ? ? ?

## 2021-06-15 ENCOUNTER — Other Ambulatory Visit: Payer: Self-pay

## 2021-06-15 ENCOUNTER — Encounter: Payer: Self-pay | Admitting: Emergency Medicine

## 2021-06-15 ENCOUNTER — Emergency Department: Payer: Medicare Other

## 2021-06-15 ENCOUNTER — Emergency Department
Admission: EM | Admit: 2021-06-15 | Discharge: 2021-06-15 | Disposition: A | Discharge status: Home / Self Care | Payer: Medicare Other | Attending: Emergency Medicine | Admitting: Emergency Medicine

## 2021-06-15 DIAGNOSIS — I10 Essential (primary) hypertension: Secondary | ICD-10-CM | POA: Diagnosis not present

## 2021-06-15 DIAGNOSIS — I2510 Atherosclerotic heart disease of native coronary artery without angina pectoris: Secondary | ICD-10-CM | POA: Insufficient documentation

## 2021-06-15 DIAGNOSIS — F17200 Nicotine dependence, unspecified, uncomplicated: Secondary | ICD-10-CM | POA: Diagnosis not present

## 2021-06-15 DIAGNOSIS — M79641 Pain in right hand: Secondary | ICD-10-CM

## 2021-06-15 DIAGNOSIS — M25531 Pain in right wrist: Secondary | ICD-10-CM | POA: Diagnosis present

## 2021-06-15 DIAGNOSIS — M654 Radial styloid tenosynovitis [de Quervain]: Secondary | ICD-10-CM | POA: Insufficient documentation

## 2021-06-15 DIAGNOSIS — J439 Emphysema, unspecified: Secondary | ICD-10-CM | POA: Insufficient documentation

## 2021-06-15 NOTE — Discharge Instructions (Addendum)
Your x-ray is normal. Please continue to apply ice to your wrist, 20 minutes off and 20 minutes on. Wear your splint as we discussed. Do not wear it 24 hours per day as this may increase your risk of blood clots. Follow up with orthopedics if your symptoms do not improve. It was a pleasure caring for you. ?

## 2021-06-15 NOTE — ED Provider Notes (Signed)
? ?[facility] ?Provider Note ? ? ? Event Date/Time  ? First MD Initiated Contact with Patient 06/15/21 1502   ?  (approximate) ? ? ?History  ? ?Hand Pain ? ? ?HPI ? ?GENNELL HOW is a 56 y.o. female right-hand-dominant who presents today for evaluation of right wrist pain.  Patient reports that she has pain at the base of her thumb and when moving her thumb for the past 4 weeks.  She reports that she has been doing a lot of work around the house and cleaning and painting the house.  She denies any known injuries.  She has not had any wounds, redness, fevers, chills, bruising, or any other skin changes.  She has not been taking anything for her pain.  She reports that she feels a popping sensation when she moves her thumb.  She has not noticed any deformities. ? ?Patient Active Problem List  ? Diagnosis Date Noted  ? 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 Bilateral carotid artery stenosis 04/27/2020  ? PSVT (paroxysmal supraventricular tachycardia) ([place]) 05/12/2019  ? Familial hypercholesterolemia 03/02/2019  ? Unstable angina ([place]) 02/22/2019  ? Coronary artery disease of native artery of native heart with stable angina pectoris ([place]) 02/22/2019  ? Shortness of breath 09/17/2018  ? Palpitations 09/17/2018  ? Right lower lobe pneumonia 06/11/2018  ? Generalized anxiety disorder 10/10/2015  ? Essential hypertension 09/22/2015  ? History of stroke 09/22/2015  ? Leukocytosis 09/22/2015  ? Acute respiratory failure ([place])   ? Mixed hyperlipidemia   ? Chronic obstructive pulmonary disease ([place]) 09/04/2015  ? Acute bronchitis 04/04/2015  ? Hemoptysis 06/30/2014  ? Thrush 07/22/2013  ? Chest pain 04/27/2013  ? GERD (gastroesophageal reflux disease) 06/18/2012  ? Chronic cough 06/18/2012  ? EMPHYSEMA 05/27/2007  ? Tobacco abuse 12/11/2006  ? ? ?  ? ? ?Physical Exam  ? ?Triage Vital Signs: ?ED Triage Vitals  ?Enc Vitals Group  ?   BP 06/15/21 1502 117/68  ?   Pulse Rate 06/15/21 1502 75   ?   Resp 06/15/21 1502 16  ?   Temp 06/15/21 1502 98 ?F (36.7 ?C)  ?   Temp Source 06/15/21 1502 Oral  ?   SpO2 06/15/21 1502 97 %  ?   Weight 06/15/21 1450 121 lb 4.1 oz (55 kg)  ?   Height 06/15/21 1450 '5\' 3"'$  (1.6 m)  ?   Head Circumference --   ?   Peak Flow --   ?   Pain Score 06/15/21 1450 8  ?   Pain Loc --   ?   Pain Edu? --   ?   Excl. in [place]? --   ? ? ?Most recent vital signs: ?Vitals:  ? 06/15/21 1502  ?BP: 117/68  ?Pulse: 75  ?Resp: 16  ?Temp: 98 ?F (36.7 ?C)  ?SpO2: 97%  ? ? ?Physical Exam ?Vitals and nursing note reviewed.  ?Constitutional:   ?   General: Awake and alert. No acute distress. ?   Appearance: Normal appearance. He is well-developed and normal weight.  ?HENT:  ?   Head: Normocephalic and atraumatic.  ?   Mouth/Throat:  ?   Mouth: Mucous membranes are moist.  ?Eyes:  ?   General: PERRL. Normal EOMs     ?   Right eye: No discharge.     ?   Left eye: No discharge.  ?   Conjunctiva/sclera: Conjunctivae normal.  ?Cardiovascular:  ?   Rate and Rhythm:  Normal rate and regular rhythm.  ?   Pulses: Normal pulses.  ?Pulmonary:  ?   Effort: Pulmonary effort is normal. No respiratory distress.  ?Abdominal:  ?   Abdomen is soft. ?Musculoskeletal:     ?   General: No swelling. Normal range of motion.  ?Right wrist: Tenderness to palpation along the dorsum of the wrist along the radial aspect to the base of the thumb.  Able to abduct and adduct thumb against resistance, though pain with doing so.  Able to give thumbs up, cross fingers, make okay sign and hold closed against resistance.  Positive Finkelstein test ?Lymphadenopathy:  ?   Cervical: No cervical adenopathy.  ?Skin: ?   General: Skin is warm and dry.  ?   Capillary Refill: Capillary refill takes less than 2 seconds.  ?   Findings: No rash.  ?Neurological:  ?   Mental Status: He is alert.  ?  ? ? ?ED Results / Procedures / Treatments  ? ?Labs ?(all labs ordered are listed, but only abnormal results are displayed) ?Labs Reviewed - No data to  display ? ? ?EKG ? ? ? ? ?RADIOLOGY ?I reviewed the patient's x-ray and agree with radiologist findings ? ? ? ?PROCEDURES: ? ?Critical Care performed:  ? ?Procedures ? ? ?MEDICATIONS ORDERED IN ED: ?Medications - No data to display ? ? ?IMPRESSION / MDM / ASSESSMENT AND PLAN / ED COURSE  ?I reviewed the triage vital signs and the nursing notes. ? ? ?Differential diagnosis includes, but is not limited to, dequervains tenosynovitis, tendon injury, contusion, sprain less likely fracture, dislocation. She is awake and alert, neurovascularly intact. There is no erythema, wounds, swelling, or signs of infection. She has tenderness along her radial wrist with a positive finkelstein test, most consistent with de quervains tenosynovitis. Full AROM and PROM, doubt tendon tear. X-ray was obtained out of an abundance of caution and is normal.  She was placed in a thumb spica splint, removable, to help rest the tendons.  We also discussed rest, ice, elevation.  Patient reports that she was told not to take NSAIDs given her heart disease, therefore I recommended ice, 20 minutes on, 20 minutes off to help reduce inflammation.  We discussed return precautions and she was given the information for orthopedics if her symptoms persist.  Patient understands and agrees with plan.  She was discharged in stable condition. ? ? ?FINAL CLINICAL IMPRESSION(S) / ED DIAGNOSES  ? ?Final diagnoses:  ?Right hand pain  ?Tenosynovitis, de Quervain  ? ? ? ?Rx / DC Orders  ? ?ED Discharge Orders   ? ? None  ? ?  ? ? ? ?Note:  This document was prepared using Dragon voice recognition software and may include unintentional dictation errors. ?  ?Marquette Old, PA-C ?06/15/21 1945 ? ?  ?Rada Hay, MD ?06/16/21 1058 ? ?

## 2021-06-15 NOTE — ED Triage Notes (Signed)
Pt reports for the past 3 weeks she has had pain to her right hand at the base of her thumb through her wrist. Pt states when she moves certain ways it pops like it is going in and out of place. Denies obvious injuries ?

## 2021-06-30 ENCOUNTER — Emergency Department: Payer: Medicare Other

## 2021-06-30 ENCOUNTER — Encounter: Payer: Self-pay | Admitting: Emergency Medicine

## 2021-06-30 ENCOUNTER — Emergency Department
Admission: EM | Admit: 2021-06-30 | Discharge: 2021-06-30 | Disposition: A | Discharge status: Home / Self Care | Payer: Medicare Other | Attending: Emergency Medicine | Admitting: Emergency Medicine

## 2021-06-30 ENCOUNTER — Other Ambulatory Visit: Payer: Self-pay

## 2021-06-30 DIAGNOSIS — M25531 Pain in right wrist: Secondary | ICD-10-CM | POA: Insufficient documentation

## 2021-06-30 NOTE — ED Provider Notes (Signed)
   [facility] Provider Note    Event Date/Time   First MD Initiated Contact with Patient 06/30/21 1641     (approximate)  History   Chief Complaint: Wrist Pain  HPI  Kimberly Kemp is a 56 y.o. female presents to the emergency department for right wrist pain.  According to the patient for the past few weeks she has been experiencing pain in the right wrist.  She states approximately 1 to 2 weeks ago she felt a pop in the right wrist and ever since she states the pain has been worse.  Patient has been wearing a removable wrist splint.  Denies any trauma no falls.  Physical Exam   Triage Vital Signs: ED Triage Vitals  Enc Vitals Group     BP 06/30/21 1643 (!) 179/85     Pulse Rate 06/30/21 1643 82     Resp 06/30/21 1643 17     Temp 06/30/21 1643 98.5 F (36.9 C)     Temp Source 06/30/21 1643 Oral     SpO2 06/30/21 1643 98 %     Weight 06/30/21 1636 120 lb (54.4 kg)     Height 06/30/21 1636 '5\' 3"'$  (1.6 m)     Head Circumference --      Peak Flow --      Pain Score 06/30/21 1635 10     Pain Loc --      Pain Edu? --      Excl. in [place]? --     Most recent vital signs: Vitals:   06/30/21 1643  BP: (!) 179/85  Pulse: 82  Resp: 17  Temp: 98.5 F (36.9 C)  SpO2: 98%    General: Awake, no distress.  CV:  Good peripheral perfusion.  Regular rate and rhythm  Resp:  Normal effort.  Equal breath sounds bilaterally.  Abd:  No distention.  Soft, nontender.  No rebound or guarding. Other:  Moderate tenderness to palpation over the lateral aspect of the right wrist mostly over the distal radius.  Neurovascular intact distally.  No cyst palpated.   RADIOLOGY  I have reviewed the x-ray images no acute fracture seen on my interpretation. Radiology is read the x-ray is minimal degenerative spurring at the distal aspect of the scaphoid and triscaphe  joint.  MEDICATIONS ORDERED IN ED: Medications - No data to display   IMPRESSION / MDM / [place]  / ED COURSE  I reviewed the triage vital signs and the nursing notes.  Patient presents emergency department for right wrist pain.  Nontraumatic.  Overall the patient appears well.  We will obtain x-ray imaging of the right wrist as a precaution.  If no concerning findings on x-ray imaging we will have the patient continue to wear her wrist splint and follow-up with orthopedics for further evaluation.  Differential would include fracture, tendinitis, muscular pain.  X-ray most consistent with degenerative changes.  We will have the patient continue to wear her wrist splint as needed for discomfort.  Follow-up with orthopedics.  Patient agreeable to plan.  FINAL CLINICAL IMPRESSION(S) / ED DIAGNOSES   Right wrist pain  Rx / DC Orders   Orthopedic follow-up  Removable wrist splint  Note:  This document was prepared using Dragon voice recognition software and may include unintentional dictation errors.   Harvest Dark, MD 06/30/21 2255

## 2021-06-30 NOTE — ED Triage Notes (Signed)
Pt reports has had issues with her right wrist and has been seen here for the same but has not been able to follow up. Pt states was doing something today and felt a pop on her wrist and now there is a knot\.

## 2021-07-15 ENCOUNTER — Other Ambulatory Visit: Payer: Self-pay | Admitting: Internal Medicine

## 2021-07-22 ENCOUNTER — Other Ambulatory Visit: Payer: Self-pay | Admitting: Internal Medicine

## 2021-07-24 ENCOUNTER — Other Ambulatory Visit: Payer: Self-pay | Admitting: Internal Medicine

## 2021-08-03 ENCOUNTER — Emergency Department
Admission: EM | Admit: 2021-08-03 | Discharge: 2021-08-03 | Disposition: A | Discharge status: Home / Self Care | Payer: Medicare Other | Attending: Emergency Medicine | Admitting: Emergency Medicine

## 2021-08-03 ENCOUNTER — Emergency Department: Payer: Medicare Other

## 2021-08-03 ENCOUNTER — Other Ambulatory Visit: Payer: Self-pay

## 2021-08-03 DIAGNOSIS — S99922A Unspecified injury of left foot, initial encounter: Secondary | ICD-10-CM | POA: Diagnosis present

## 2021-08-03 DIAGNOSIS — S92355A Nondisplaced fracture of fifth metatarsal bone, left foot, initial encounter for closed fracture: Secondary | ICD-10-CM

## 2021-08-03 DIAGNOSIS — Y9339 Activity, other involving climbing, rappelling and jumping off: Secondary | ICD-10-CM | POA: Diagnosis not present

## 2021-08-03 DIAGNOSIS — S93602A Unspecified sprain of left foot, initial encounter: Secondary | ICD-10-CM

## 2021-08-03 DIAGNOSIS — X509XXA Other and unspecified overexertion or strenuous movements or postures, initial encounter: Secondary | ICD-10-CM | POA: Diagnosis not present

## 2021-08-03 MED ORDER — HYDROCODONE-ACETAMINOPHEN 5-325 MG PO TABS
1.0000 | ORAL_TABLET | Freq: Once | ORAL | Status: AC
  Administered 2021-08-03: 1 via ORAL
  Filled 2021-08-03: qty 1

## 2021-08-03 MED ORDER — HYDROCODONE-ACETAMINOPHEN 5-325 MG PO TABS: 1.0000 | ORAL_TABLET | Freq: Three times a day (TID) | ORAL | 0 refills | Status: AC | PRN

## 2021-08-03 NOTE — Discharge Instructions (Addendum)
You have a minimally displaced fracture to the base of the foot at the fifth metatarsal.  You should wear the postop shoe and bear weight as tolerated.  Use the crutches to ambulate.  Rest with the foot elevated and apply ice to reduce swelling.  Take the prescription pain medicine as needed and OTC Tylenol for nondrowsy pain relief.  Follow-up with podiatry as discussed.

## 2021-08-03 NOTE — ED Triage Notes (Signed)
Pt to er room number 53 via ems, pt states that she was getting out of bed this am and jumped and felt a pop in her L foot, pt has ace wrap in place and swelling to L foot.

## 2021-08-03 NOTE — ED Provider Notes (Signed)
[facility] Emergency Department Provider Note     Event Date/Time   First MD Initiated Contact with Patient 08/03/21 1819     (approximate)   History   Foot Injury   HPI  Kimberly Kemp is a 56 y.o. female with noncontributory medical history, presents to the ED via EMS following a mechanical injury.  Patient was getting out of bed this morning, and apparently jumped out of bed suddenly, rolling her left and noted immediate pain to the lateral left foot.  She presents today after several hours of resting and elevating the foot.  She notes pain with attempted ambulation, soft tissue swelling, and bruising to the dorsal lateral left foot.  She denies any other injury related to the fall.   Physical Exam   Triage Vital Signs: ED Triage Vitals  Enc Vitals Group     BP 08/03/21 1814 138/71     Pulse Rate 08/03/21 1814 67     Resp 08/03/21 1814 18     Temp 08/03/21 1814 98.6 F (37 C)     Temp Source 08/03/21 1814 Oral     SpO2 08/03/21 1814 98 %     Weight 08/03/21 1815 118 lb (53.5 kg)     Height 08/03/21 1815 5\' 3"  (1.6 m)     Head Circumference --      Peak Flow --      Pain Score 08/03/21 1815 10     Pain Loc --      Pain Edu? --      Excl. in [place]? --     Most recent vital signs: Vitals:   08/03/21 1814  BP: 138/71  Pulse: 67  Resp: 18  Temp: 98.6 F (37 C)  SpO2: 98%    General Awake, no distress.  CV:  Good peripheral perfusion.  RESP:  Normal effort.  ABD:  No distention.  MSK:  Left foot with soft tissue swelling noted dorsal laterally.  Also with ecchymosis in the same area.  Normal ankle range of motion is appreciated.   ED Results / Procedures / Treatments   Labs (all labs ordered are listed, but only abnormal results are displayed) Labs Reviewed - No data to display   EKG   RADIOLOGY  I personally viewed and evaluated these images as part of my medical decision making, as well as reviewing the written report by  the radiologist.  ED Provider Interpretation: minimally displaced oblique 5th MT shaft  DG Foot Complete Left  Result Date: 08/03/2021 CLINICAL DATA:  lat foot pain/ STS/ecchymosis EXAM: LEFT FOOT - COMPLETE 3+ VIEW COMPARISON:  None Available. FINDINGS: Acute displaced fifth metatarsal oblique fracture extending to the fourth-fifth metatarsal base joint. No dislocation. There is no evidence of arthropathy or other focal bone abnormality. Soft tissues are unremarkable. IMPRESSION: Acute displaced fifth metatarsal oblique fracture extending to the fourth-fifth metatarsal base joint. Electronically Signed   By: Tish Frederickson M.D.   On: 08/03/2021 19:13     PROCEDURES:  Critical Care performed: No  Procedures   MEDICATIONS ORDERED IN ED: Medications  HYDROcodone-acetaminophen (NORCO/VICODIN) 5-325 MG per tablet 1 tablet (has no administration in time range)     IMPRESSION / MDM / ASSESSMENT AND PLAN / ED COURSE  I reviewed the triage vital signs and the nursing notes.  Differential diagnosis includes, but is not limited to, foot sprain, foot fracture, ankle sprain, foot contusion  Patient's presentation is most consistent with acute complicated illness / injury requiring diagnostic workup.  Patient's diagnosis is consistent with initial fracture management of a closed displaced fifth metatarsal shaft fracture on the left foot. Patient will be discharged home with prescriptions for hydrocodone. Patient is to follow up with podiatry as needed or otherwise directed. Patient is given ED precautions to return to the ED for any worsening or new symptoms.     FINAL CLINICAL IMPRESSION(S) / ED DIAGNOSES   Final diagnoses:  Sprain of left foot, initial encounter  Closed nondisplaced fracture of fifth metatarsal bone of left foot, initial encounter     Rx / DC Orders   ED Discharge Orders          Ordered    HYDROcodone-acetaminophen (NORCO) 5-325 MG  tablet  3 times daily PRN        08/03/21 1948             Note:  This document was prepared using Dragon voice recognition software and may include unintentional dictation errors.    Lissa Hoard, PA-C 08/03/21 1950    Shaune Pollack, MD 08/07/21 1100

## 2021-08-05 ENCOUNTER — Telehealth: Payer: Self-pay | Admitting: Internal Medicine

## 2021-08-05 MED ORDER — NITROGLYCERIN 0.4 MG SL SUBL
0.4000 mg | SUBLINGUAL_TABLET | SUBLINGUAL | 0 refills | Status: DC | PRN
Start: 2021-08-05 — End: 2022-03-25

## 2021-08-05 NOTE — Telephone Encounter (Signed)
*  STAT* If patient is at the pharmacy, call can be transferred to refill team.   1. Which medications need to be refilled? (please list name of each medication and dose if known)   nitroGLYCERIN (NITROSTAT) 0.4 MG SL tablet  2. Which pharmacy/location (including street and city if local pharmacy) is medication to be sent to?  [id number]- [place], [place] - [address]   3. Do they need a 30 day or 90 day supply?   30 day  Patient stated she is out of this medication.  She lost medication when she returned from hospital on 7/1.

## 2021-08-05 NOTE — Telephone Encounter (Signed)
Requested Prescriptions   Signed Prescriptions Disp Refills   nitroGLYCERIN (NITROSTAT) 0.4 MG SL tablet 25 tablet 0    Sig: Place 1 tablet (0.4 mg total) under the tongue every 5 (five) minutes as needed for chest pain. Maximum of 3 doses.    Authorizing Provider: END, CHRISTOPHER    Ordering User: Britt Bottom

## 2021-08-09 ENCOUNTER — Ambulatory Visit: Payer: Medicare Other | Admitting: Internal Medicine

## 2021-08-09 NOTE — Progress Notes (Deleted)
Follow-up Outpatient Visit Date: 08/09/2021  Primary Care Provider: Martin Majestic, [place] [address]  Chief Complaint: ***  HPI:  Kimberly Kemp is a 56 y.o. female with history of coronary artery disease status post PCI to the RCA in the setting of iatrogenic dissection of the RCA (02/22/2019), ischemic cardiomyopathy, hypertension, familial hyperlipidemia, stroke, questionable paroxysmal atrial fibrillation, questionable pulmonary embolism, COPD, colon cancer, hepatitis B, anxiety, and tobacco abuse, who presents for follow-up of coronary artery disease and subclavian stenosis.  I last saw her in March, at which time she reported palpitations.  She also noted intermittent pain and numbness in the left arm.  We agreed to increase metoprolol to tartrate to 50 mg twice daily.  --------------------------------------------------------------------------------------------------  Cardiovascular History & Procedures: Cardiovascular Problems: Coronary artery disease s/p PCI to RCA (02/2019) Paroxysmal atrial fibrillation (questionable; no objective documentation in the patient's chart) Pulmonary embolism (questionable; no objective documentation in the patient's chart) Shortness of breath   Risk Factors: Prior stroke, hypertension, hyperlipidemia, and tobacco use   Cath/PCI: RHC/LHC and PCI (02/22/2019): LMCA normal.  LAD normal.  LCx normal.  Dominant RCA with 60 to 70% mid vessel stenosis.  RA 9, RV 33/9, PA 26/13, PCWP 13, Fick CO/CI 7.8/4.9.  LVEDP 15.  Attempted IFR of mid RCA complicated by guide wire dissection necessitating stenting of the ostial through distal RCA with overlapping Resolute Onyx stents (2.75 x 38 and 3.0 x 38 mm).  RCA was jailed with loss of flow.   CV Surgery: None   EP Procedures and Devices: 14-day event monitor (01/05/2019): Predominantly sinus rhythm with rare PAC's and PVC's, as well as rare episodes of brief PSVT.  Patient  triggered events correspond to sinus rhythm.   Non-Invasive Evaluation(s): Carotid Doppler (03/21/2021): 1-39% stenoses in bilateral ICA's.  <50% stenosis in both CCA's and ECA's.  Turbulent flow noted in the left subclavian artery with antegrade flow in both vertebral arteries. Pharmacologic MPI (11/16/2019): Low risk study with fixed defect at the apex.  On personal review, the fixed defect involves the inferior wall as well.  No evidence of ischemia.  LVEF 55-65%. Carotid Doppler (10/24/2019): Right ICA with 40-59% stenosis, left ICA with 1-39% stenosis.  Antegrade flow in both vertebral arteries.  Normal subclavian artery flow dynamics. TTE (02/23/2019): Normal LV size and wall thickness.  LVEF 55-60% with inferoseptal akinesis.  Normal RV size and function.  No significant valvular abnormality.  Normal PA pressure. Dobutamine stress echocardiogram (11/16/2018): Low risk study with normal baseline LVEF and no evidence of inducible wall motion abnormality.  LVEF 55-60%. Exercise tolerance test (09/20/2018): Low to intermediate risk study with decreased exercise capacity and significant motion artifact.  Recent CV Pertinent Labs: Lab Results  Component Value Date   CHOL 170 11/08/2020   HDL 67 11/08/2020   LDLCALC 65 11/08/2020   TRIG 192 (H) 11/08/2020   CHOLHDL 2.5 11/08/2020   K 3.9 05/26/2021   BUN 18 05/26/2021   CREATININE 1.02 (H) 05/26/2021    Past medical and surgical history were reviewed and updated in EPIC.  No outpatient medications have been marked as taking for the 08/09/21 encounter (Appointment) with Aslynn Brunetti, Kimberly Gave, MD.    Allergies: Penicillins, Omnipaque [iohexol], and Red dye  Social History   Tobacco Use   Smoking status: Every Day    Packs/day: 0.50    Years: 43.00    Total pack years: 21.50    Types: Cigarettes   Smokeless tobacco: Never  Tobacco comments:    5 per day-06/06/2021  Vaping Use   Vaping Use: Never used  Substance Use Topics   Alcohol use:  No    Alcohol/week: 0.0 standard drinks of alcohol   Drug use: No    Family History  Problem Relation Age of Onset   Emphysema Father    Prostate cancer Father    Liver disease Father    Emphysema Maternal Grandmother    Diabetes Mother    Heart disease Mother 76       CABG and valve replacement   Colon cancer Neg Hx     Review of Systems: A 12-system review of systems was performed and was negative except as noted in the HPI.  --------------------------------------------------------------------------------------------------  Physical Exam: LMP 07/18/2016   General:  NAD. Neck: No JVD or HJR. Lungs: Clear to auscultation bilaterally without wheezes or crackles. Heart: Regular rate and rhythm without murmurs, rubs, or gallops. Abdomen: Soft, nontender, nondistended. Extremities: No lower extremity edema.  EKG:  ***  Lab Results  Component Value Date   WBC 8.7 05/26/2021   HGB 14.1 05/26/2021   HCT 42.8 05/26/2021   MCV 92.0 05/26/2021   PLT 236 05/26/2021    Lab Results  Component Value Date   NA 141 05/26/2021   K 3.9 05/26/2021   CL 113 (H) 05/26/2021   CO2 23 05/26/2021   BUN 18 05/26/2021   CREATININE 1.02 (H) 05/26/2021   GLUCOSE 104 (H) 05/26/2021   ALT 30 11/08/2020    Lab Results  Component Value Date   CHOL 170 11/08/2020   HDL 67 11/08/2020   LDLCALC 65 11/08/2020   TRIG 192 (H) 11/08/2020   CHOLHDL 2.5 11/08/2020    --------------------------------------------------------------------------------------------------  ASSESSMENT AND PLAN: Kimberly Gave Manuelita Moxon, MD 08/09/2021 10:42 AM

## 2021-08-12 ENCOUNTER — Encounter: Payer: Self-pay | Admitting: Internal Medicine

## 2021-08-20 ENCOUNTER — Ambulatory Visit (INDEPENDENT_AMBULATORY_CARE_PROVIDER_SITE_OTHER): Payer: Medicare Other | Admitting: Podiatry

## 2021-08-20 ENCOUNTER — Encounter: Payer: Self-pay | Admitting: Podiatry

## 2021-08-20 DIAGNOSIS — S92355A Nondisplaced fracture of fifth metatarsal bone, left foot, initial encounter for closed fracture: Secondary | ICD-10-CM

## 2021-08-20 MED ORDER — OXYCODONE-ACETAMINOPHEN 5-325 MG PO TABS: 1.0000 | ORAL_TABLET | ORAL | 0 refills | Status: DC | PRN

## 2021-08-20 NOTE — Addendum Note (Signed)
Addended by: Boneta Lucks on: 08/20/2021 04:32 PM   Modules accepted: Orders

## 2021-08-20 NOTE — Progress Notes (Signed)
Subjective:  Patient ID: Kimberly Kemp, female    DOB: [birth year],  MRN: [id number]  Chief Complaint  Patient presents with   Foot Pain    "I broke my foot."    56 y.o. female presents with the above complaint.  Patient presents with complaint left fifth metatarsal fracture.  Patient states that she is having a lot of pain she tripped and fell on August 03, 2021.  She states she felt a crunch and break.  She went to urgent care they put her in her shoe.  She denies seeing anyone else prior to seeing me pain scale 7 out of 10 is swollen and hurts with ambulation.  She has been using her crutches and a surgical shoe.   Review of Systems: Negative except as noted in the HPI. Denies N/V/F/Ch.  Past Medical History:  Diagnosis Date   ? h/o Paroxysmal atrial fibrillation [facility])    ? h/o Pulmonary embolism (HCC)    Anxiety    Asthma    Atypical chest pain    a. 09/2018 ETT: poor exercise tolerance; b. 11/2018 Dobutamine Echo: No wall motion abnormalities, EF 55-60%.   CAD (coronary artery disease)    a. 02/2019 Cath/PCI: LM nl, LAD nl, LCX nl, RCA 36m(attempted iFR->wire dissection-->3.0x38 Resolute Onyx DES prox, 2.75x38 Resolute Onyx DES mid), RPDA jailed by stent. EF 65%; b. 11/2019 MV: EF 55-65%. medium-sized apical defect - likely diaph atten. No ischemia-->low risk.   Chronic pain    Cirrhosis (HCC)    Colon cancer (HCC)    [place]    COVID-19 virus infection 11/2019   Depression    Emphysema    GERD (gastroesophageal reflux disease)    Heart murmur    Hepatitis B    History of echocardiogram    a. 02/2019 Echo: EF 55-60%. No rwma. No significant valvular disease.   Hyperlipidemia    Hypertension    Palpitations    a. 02/2019 Zio: Sinus, avg 83 (55-135). Rare PACs/PVCs. Four atrial runs, longest 7 beats, fastest 146 bpm. Triggered event->sinus rhythm & artifact.   Stroke ([facility]    Tobacco abuse     Current Outpatient Medications:     albuterol (PROVENTIL) (2.5 MG/3ML) 0.083% nebulizer solution, TAKE 3 MLS (2.5 MG TOTAL) BY NEBULIZATION 4 TIMES DAILY AS NEEDED FOR WHEEZING SHORTNESS OF BREATH, Disp: 375 mL, Rfl: 3   albuterol (VENTOLIN HFA) 108 (90 Base) MCG/ACT inhaler, INHALE 2 PUFFS INTO THE LUNGS EVERY 4 HOURS AS NEEDED FOR WHEEZE OR FOR SHORTNESS OF BREATH, Disp: 8.5 each, Rfl: 2   aspirin EC 81 MG tablet, Take 81 mg by mouth daily., Disp: , Rfl:    budesonide (PULMICORT) 0.25 MG/2ML nebulizer solution, USE 2ML (0.25 MG TOTAL) BY NEBULIZATION TWICE A DAY, Disp: 360 mL, Rfl: 3   butalbital-acetaminophen-caffeine (FIORICET, ESGIC) 50-325-40 MG tablet, Take 1 tablet by mouth 3 (three) times daily as needed for headache., Disp: , Rfl: 2   diltiazem (CARDIZEM CD) 180 MG 24 hr capsule, TAKE 1 CAPSULE BY MOUTH EVERY DAY, Disp: 90 capsule, Rfl: 0   ezetimibe (ZETIA) 10 MG tablet, TAKE 1 TABLET BY MOUTH EVERY DAY, Disp: 90 tablet, Rfl: 0   famotidine (PEPCID) 40 MG tablet, Take 40 mg by mouth at bedtime., Disp: , Rfl:    gabapentin (NEURONTIN) 300 MG capsule, Take 300 mg by mouth at bedtime., Disp: , Rfl: 1   ipratropium (ATROVENT) 0.02 % nebulizer solution, Take 2.5 mLs (0.5 mg  total) by nebulization 4 (four) times daily., Disp: 300 mL, Rfl: 3   levofloxacin (LEVAQUIN) 500 MG tablet, Take 1 tablet (500 mg total) by mouth daily., Disp: 7 tablet, Rfl: 0   magic mouthwash w/lidocaine SOLN, Take 10 mLs by mouth 4 (four) times daily as needed for mouth pain., Disp: 120 mL, Rfl: 0   metoprolol tartrate (LOPRESSOR) 50 MG tablet, TAKE 1 TABLET BY MOUTH TWICE A DAY, Disp: 180 tablet, Rfl: 0   montelukast (SINGULAIR) 10 MG tablet, Take 10 mg by mouth daily., Disp: , Rfl:    nitroGLYCERIN (NITROSTAT) 0.4 MG SL tablet, Place 1 tablet (0.4 mg total) under the tongue every 5 (five) minutes as needed for chest pain. Maximum of 3 doses., Disp: 25 tablet, Rfl: 0   nystatin (MYCOSTATIN) 100000 UNIT/ML suspension, Take 5 mLs (500,000 Units total) by  mouth 4 (four) times daily., Disp: 60 mL, Rfl: 0   Omega-3 Fatty Acids (FISH OIL PO), Take 1 capsule by mouth 2 (two) times daily., Disp: , Rfl:    omeprazole (PRILOSEC) 40 MG capsule, TAKE 1 CAPSULE BY MOUTH EVERY DAY, Disp: 30 capsule, Rfl: 2   PAZEO 0.7 % SOLN, Place 1 drop into both eyes daily., Disp: , Rfl:    pramipexole (MIRAPEX) 1 MG tablet, Take 1 mg by mouth at bedtime., Disp: , Rfl:    rosuvastatin (CRESTOR) 40 MG tablet, TAKE 1 TABLET BY MOUTH EVERY DAY, Disp: 90 tablet, Rfl: 3  Social History   Tobacco Use  Smoking Status Every Day   Packs/day: 0.50   Years: 43.00   Total pack years: 21.50   Types: Cigarettes  Smokeless Tobacco Never  Tobacco Comments   5 per day-06/06/2021    Allergies  Allergen Reactions   Penicillins Hives    Did it involve swelling of the face/tongue/throat, SOB, or low BP? No Did it involve sudden or severe rash/hives, skin peeling, or any reaction on the inside of your mouth or nose? Yes Did you need to seek medical attention at a hospital or doctor's office? Yes When did it last happen?      10 Years If all above answers are "NO", may proceed with cephalosporin use.    Omnipaque [Iohexol] Itching        Red Dye Itching   Objective:  There were no vitals filed for this visit. There is no height or weight on file to calculate BMI. Constitutional Well developed. Well nourished.  Vascular Dorsalis pedis pulses palpable bilaterally. Posterior tibial pulses palpable bilaterally. Capillary refill normal to all digits.  No cyanosis or clubbing noted. Pedal hair growth normal.  Neurologic Normal speech. Oriented to person, place, and time. Epicritic sensation to light touch grossly present bilaterally.  Dermatologic Nails well groomed and normal in appearance. No open wounds. No skin lesions.  Orthopedic: Pain on palpation left fifth metatarsal pain with range of motion of the fifth digit pain along the course of the fifth metatarsal shaft.    Radiographs: 3 views of skeletally mature adult left foot: Long oblique spiral fracture noted along the shaft of the fifth metatarsal.  Appears to be well aligned not as displaced. Assessment:   1. Closed nondisplaced fracture of fifth metatarsal bone of left foot, initial encounter    Plan:  Patient was evaluated and treated and all questions answered.  Left fifth metatarsal fracture not displaced -All questions and concerns were discussed with the patient in extensive detail -At this time patient does not need surgical intervention and we  will clinically continue to monitor the fracture.  I discussed with her as long as her pain is going down the fracture can take up to a year to completely heal.  I discussed with patient she states understanding -She will be placed in a cam boot and she can be weightbearing as tolerated.  If it continues to worsen we will discuss surgical options at that time.  No follow-ups on file.

## 2021-08-21 ENCOUNTER — Other Ambulatory Visit: Payer: Self-pay | Admitting: Internal Medicine

## 2021-08-21 ENCOUNTER — Other Ambulatory Visit: Payer: Self-pay | Admitting: Pulmonary Disease

## 2021-08-26 ENCOUNTER — Other Ambulatory Visit: Payer: Self-pay | Admitting: Internal Medicine

## 2021-08-26 NOTE — Telephone Encounter (Signed)
Please contact pt for future appointment. Pt due for 3 month f/u. Pt needing refills. 

## 2021-09-04 ENCOUNTER — Ambulatory Visit: Payer: Medicare Other | Admitting: Internal Medicine

## 2021-09-04 NOTE — Progress Notes (Deleted)
Follow-up Outpatient Visit Date: 09/04/2021  Primary Care Provider: Martin Majestic, [place] [address]  Chief Complaint: ***  HPI:  [name] is a 56 y.o. female with history of coronary artery disease status post PCI to the RCA in the setting of iatrogenic dissection of the RCA (02/22/2019), ischemic cardiomyopathy, hypertension, familial hyperlipidemia, stroke, questionable paroxysmal atrial fibrillation, questionable pulmonary embolism, COPD, colon cancer, hepatitis B, anxiety, and tobacco abuse, who presents for follow-up of coronary artery disease and subclavian stenosis.  I last saw her in March, at which time she reported palpitations.  She also noted intermittent pain and numbness in the left arm.  We agreed to increase metoprolol to tartrate to 50 mg twice daily.  --------------------------------------------------------------------------------------------------  Cardiovascular History & Procedures: Cardiovascular Problems: Coronary artery disease s/p PCI to RCA (02/2019) Paroxysmal atrial fibrillation (questionable; no objective documentation in the patient's chart) Pulmonary embolism (questionable; no objective documentation in the patient's chart) Shortness of breath   Risk Factors: Prior stroke, hypertension, hyperlipidemia, and tobacco use   Cath/PCI: RHC/LHC and PCI (02/22/2019): LMCA normal.  LAD normal.  LCx normal.  Dominant RCA with 60 to 70% mid vessel stenosis.  RA 9, RV 33/9, PA 26/13, PCWP 13, Fick CO/CI 7.8/4.9.  LVEDP 15.  Attempted IFR of mid RCA complicated by guide wire dissection necessitating stenting of the ostial through distal RCA with overlapping Resolute Onyx stents (2.75 x 38 and 3.0 x 38 mm).  RCA was jailed with loss of flow.   CV Surgery: None   EP Procedures and Devices: 14-day event monitor (01/05/2019): Predominantly sinus rhythm with rare PAC's and PVC's, as well as rare episodes of brief PSVT.  Patient  triggered events correspond to sinus rhythm.   Non-Invasive Evaluation(s): Carotid Doppler (03/21/2021): 1-39% stenoses in bilateral ICA's.  <50% stenosis in both CCA's and ECA's.  Turbulent flow noted in the left subclavian artery with antegrade flow in both vertebral arteries. Pharmacologic MPI (11/16/2019): Low risk study with fixed defect at the apex.  On personal review, the fixed defect involves the inferior wall as well.  No evidence of ischemia.  LVEF 55-65%. Carotid Doppler (10/24/2019): Right ICA with 40-59% stenosis, left ICA with 1-39% stenosis.  Antegrade flow in both vertebral arteries.  Normal subclavian artery flow dynamics. TTE (02/23/2019): Normal LV size and wall thickness.  LVEF 55-60% with inferoseptal akinesis.  Normal RV size and function.  No significant valvular abnormality.  Normal PA pressure. Dobutamine stress echocardiogram (11/16/2018): Low risk study with normal baseline LVEF and no evidence of inducible wall motion abnormality.  LVEF 55-60%. Exercise tolerance test (09/20/2018): Low to intermediate risk study with decreased exercise capacity and significant motion artifact.  Recent CV Pertinent Labs: Lab Results  Component Value Date   CHOL 170 11/08/2020   HDL 67 11/08/2020   LDLCALC 65 11/08/2020   TRIG 192 (H) 11/08/2020   CHOLHDL 2.5 11/08/2020   K 3.9 05/26/2021   BUN 18 05/26/2021   CREATININE 1.02 (H) 05/26/2021    Past medical and surgical history were reviewed and updated in EPIC.  No outpatient medications have been marked as taking for the 09/04/21 encounter (Appointment) with Ronnesha Mester, Harrell Gave, MD.    Allergies: Penicillins, Omnipaque [iohexol], and Red dye  Social History   Tobacco Use   Smoking status: Every Day    Packs/day: 0.50    Years: 43.00    Total pack years: 21.50    Types: Cigarettes   Smokeless tobacco: Never  Tobacco comments:    5 per day-06/06/2021  Vaping Use   Vaping Use: Never used  Substance Use Topics   Alcohol use:  No    Alcohol/week: 0.0 standard drinks of alcohol   Drug use: No    Family History  Problem Relation Age of Onset   Emphysema Father    Prostate cancer Father    Liver disease Father    Emphysema Maternal Grandmother    Diabetes Mother    Heart disease Mother 72       CABG and valve replacement   Colon cancer Neg Hx     Review of Systems: A 12-system review of systems was performed and was negative except as noted in the HPI.  --------------------------------------------------------------------------------------------------  Physical Exam: LMP 07/18/2016   General:  NAD. Neck: No JVD or HJR. Lungs: Clear to auscultation bilaterally without wheezes or crackles. Heart: Regular rate and rhythm without murmurs, rubs, or gallops. Abdomen: Soft, nontender, nondistended. Extremities: No lower extremity edema.  EKG:  ***  Lab Results  Component Value Date   WBC 8.7 05/26/2021   HGB 14.1 05/26/2021   HCT 42.8 05/26/2021   MCV 92.0 05/26/2021   PLT 236 05/26/2021    Lab Results  Component Value Date   NA 141 05/26/2021   K 3.9 05/26/2021   CL 113 (H) 05/26/2021   CO2 23 05/26/2021   BUN 18 05/26/2021   CREATININE 1.02 (H) 05/26/2021   GLUCOSE 104 (H) 05/26/2021   ALT 30 11/08/2020    Lab Results  Component Value Date   CHOL 170 11/08/2020   HDL 67 11/08/2020   LDLCALC 65 11/08/2020   TRIG 192 (H) 11/08/2020   CHOLHDL 2.5 11/08/2020    --------------------------------------------------------------------------------------------------  ASSESSMENT AND PLAN: [name], MD 09/04/2021 12:27 PM

## 2021-09-05 ENCOUNTER — Encounter: Payer: Self-pay | Admitting: Internal Medicine

## 2021-09-21 ENCOUNTER — Other Ambulatory Visit: Payer: Self-pay | Admitting: Internal Medicine

## 2021-09-26 ENCOUNTER — Ambulatory Visit: Payer: Medicare Other | Admitting: Pulmonary Disease

## 2021-10-01 ENCOUNTER — Ambulatory Visit (INDEPENDENT_AMBULATORY_CARE_PROVIDER_SITE_OTHER): Payer: Medicare Other | Admitting: Podiatry

## 2021-10-01 ENCOUNTER — Ambulatory Visit (INDEPENDENT_AMBULATORY_CARE_PROVIDER_SITE_OTHER): Payer: Medicare Other

## 2021-10-01 DIAGNOSIS — S92355A Nondisplaced fracture of fifth metatarsal bone, left foot, initial encounter for closed fracture: Secondary | ICD-10-CM

## 2021-10-01 NOTE — Progress Notes (Signed)
Subjective:  Patient ID: Kimberly Kemp, female    DOB: 03-31-1965,  MRN: [id number]  Chief Complaint  Patient presents with   Fracture    Left foot follow up  Pt stated that her foot was getting better until about 2 weeks ago when she fell and re injured her foot    56 y.o. female presents with the above complaint.  Presents with a follow-up to left fifth metatarsal shaft fracture.  She states this happened in August 03, 2021.  2 weeks ago she reinjured it.  It was doing a lot better prior to that.  She has been wearing her boot she denies any other acute complaints   Review of Systems: Negative except as noted in the HPI. Denies N/V/F/Ch.  Past Medical History:  Diagnosis Date   ? h/o Paroxysmal atrial fibrillation [facility])    ? h/o Pulmonary embolism (HCC)    Anxiety    Asthma    Atypical chest pain    a. 09/2018 ETT: poor exercise tolerance; b. 11/2018 Dobutamine Echo: No wall motion abnormalities, EF 55-60%.   CAD (coronary artery disease)    a. 02/2019 Cath/PCI: LM nl, LAD nl, LCX nl, RCA 34m(attempted iFR->wire dissection-->3.0x38 Resolute Onyx DES prox, 2.75x38 Resolute Onyx DES mid), RPDA jailed by stent. EF 65%; b. 11/2019 MV: EF 55-65%. medium-sized apical defect - likely diaph atten. No ischemia-->low risk.   Chronic pain    Cirrhosis (HCC)    Colon cancer (HCC)    [place]    COVID-19 virus infection 11/2019   Depression    Emphysema    GERD (gastroesophageal reflux disease)    Heart murmur    Hepatitis B    History of echocardiogram    a. 02/2019 Echo: EF 55-60%. No rwma. No significant valvular disease.   Hyperlipidemia    Hypertension    Palpitations    a. 02/2019 Zio: Sinus, avg 83 (55-135). Rare PACs/PVCs. Four atrial runs, longest 7 beats, fastest 146 bpm. Triggered event->sinus rhythm & artifact.   Stroke ([facility]    Tobacco abuse     Current Outpatient Medications:    albuterol (PROVENTIL) (2.5 MG/3ML) 0.083% nebulizer  solution, TAKE 3 MLS (2.5 MG TOTAL) BY NEBULIZATION 4 TIMES DAILY AS NEEDED FOR WHEEZING SHORTNESS OF BREATH, Disp: 375 mL, Rfl: 3   albuterol (VENTOLIN HFA) 108 (90 Base) MCG/ACT inhaler, INHALE 2 PUFFS INTO THE LUNGS EVERY 4 HOURS AS NEEDED FOR WHEEZE OR FOR SHORTNESS OF BREATH, Disp: 8.5 each, Rfl: 2   aspirin EC 81 MG tablet, Take 81 mg by mouth daily., Disp: , Rfl:    budesonide (PULMICORT) 0.25 MG/2ML nebulizer solution, USE 2ML (0.25 MG TOTAL) BY NEBULIZATION TWICE A DAY, Disp: 360 mL, Rfl: 3   butalbital-acetaminophen-caffeine (FIORICET, ESGIC) 50-325-40 MG tablet, Take 1 tablet by mouth 3 (three) times daily as needed for headache., Disp: , Rfl: 2   diltiazem (CARDIZEM CD) 180 MG 24 hr capsule, TAKE 1 CAPSULE BY MOUTH EVERY DAY, Disp: 90 capsule, Rfl: 0   ezetimibe (ZETIA) 10 MG tablet, TAKE 1 TABLET BY MOUTH EVERY DAY, Disp: 90 tablet, Rfl: 0   famotidine (PEPCID) 40 MG tablet, Take 40 mg by mouth at bedtime., Disp: , Rfl:    gabapentin (NEURONTIN) 300 MG capsule, Take 300 mg by mouth at bedtime., Disp: , Rfl: 1   ipratropium (ATROVENT) 0.02 % nebulizer solution, TAKE 2.5 ML (0.5 MG TOTAL) BY NEBULIZATION 4 TIMES A DAY, Disp: 312.5 mL, Rfl:  3   levofloxacin (LEVAQUIN) 500 MG tablet, Take 1 tablet (500 mg total) by mouth daily., Disp: 7 tablet, Rfl: 0   magic mouthwash w/lidocaine SOLN, Take 10 mLs by mouth 4 (four) times daily as needed for mouth pain., Disp: 120 mL, Rfl: 0   metoprolol tartrate (LOPRESSOR) 50 MG tablet, TAKE 1 TABLET BY MOUTH TWICE A DAY, Disp: 180 tablet, Rfl: 0   montelukast (SINGULAIR) 10 MG tablet, Take 10 mg by mouth daily., Disp: , Rfl:    nitroGLYCERIN (NITROSTAT) 0.4 MG SL tablet, Place 1 tablet (0.4 mg total) under the tongue every 5 (five) minutes as needed for chest pain. Maximum of 3 doses., Disp: 25 tablet, Rfl: 0   nystatin (MYCOSTATIN) 100000 UNIT/ML suspension, Take 5 mLs (500,000 Units total) by mouth 4 (four) times daily., Disp: 60 mL, Rfl: 0   Omega-3  Fatty Acids (FISH OIL PO), Take 1 capsule by mouth 2 (two) times daily., Disp: , Rfl:    omeprazole (PRILOSEC) 40 MG capsule, TAKE 1 CAPSULE BY MOUTH EVERY DAY, Disp: 30 capsule, Rfl: 2   oxyCODONE-acetaminophen (PERCOCET) 5-325 MG tablet, Take 1 tablet by mouth every 4 (four) hours as needed for severe pain., Disp: 30 tablet, Rfl: 0   PAZEO 0.7 % SOLN, Place 1 drop into both eyes daily., Disp: , Rfl:    pramipexole (MIRAPEX) 1 MG tablet, Take 1 mg by mouth at bedtime., Disp: , Rfl:    rosuvastatin (CRESTOR) 40 MG tablet, TAKE 1 TABLET BY MOUTH EVERY DAY, Disp: 90 tablet, Rfl: 3  Social History   Tobacco Use  Smoking Status Every Day   Packs/day: 0.50   Years: 43.00   Total pack years: 21.50   Types: Cigarettes  Smokeless Tobacco Never  Tobacco Comments   5 per day-06/06/2021    Allergies  Allergen Reactions   Penicillins Hives    Did it involve swelling of the face/tongue/throat, SOB, or low BP? No Did it involve sudden or severe rash/hives, skin peeling, or any reaction on the inside of your mouth or nose? Yes Did you need to seek medical attention at a hospital or doctor's office? Yes When did it last happen?      10 Years If all above answers are "NO", may proceed with cephalosporin use.    Omnipaque [Iohexol] Itching        Red Dye Itching   Objective:  There were no vitals filed for this visit. There is no height or weight on file to calculate BMI. Constitutional Well developed. Well nourished.  Vascular Dorsalis pedis pulses palpable bilaterally. Posterior tibial pulses palpable bilaterally. Capillary refill normal to all digits.  No cyanosis or clubbing noted. Pedal hair growth normal.  Neurologic Normal speech. Oriented to person, place, and time. Epicritic sensation to light touch grossly present bilaterally.  Dermatologic Nails well groomed and normal in appearance. No open wounds. No skin lesions.  Orthopedic: Pain on palpation left fifth metatarsal pain  with range of motion of the fifth digit pain along the course of the fifth metatarsal shaft.   Radiographs: 3 views of skeletally mature adult left foot: Long oblique spiral fracture noted along the shaft of the fifth metatarsal.  Appears to be well aligned not as displaced. Assessment:   1. Closed nondisplaced fracture of fifth metatarsal bone of left foot, initial encounter    Plan:  Patient was evaluated and treated and all questions answered.  Left fifth metatarsal fracture not displaced -All questions and concerns were discussed with the  patient in extensive detail -Continue using cam boot as she has reinjured it and may have caused some soft tissue pain.  At this time from radiographic standpoint the fracture is healing well.  No signs of nonunion noted.  No other bony abnormalities noted some consolidation noted. -Ankle surgery return to regular shoes in 4 weeks after the soft tissue structure has had time to heal.  Patient agrees with the plan.  No follow-ups on file.

## 2021-10-02 ENCOUNTER — Telehealth: Payer: Self-pay | Admitting: *Deleted

## 2021-10-02 NOTE — Telephone Encounter (Signed)
Patient is  calling for status of something being sent to pharmacy for pain,mentioned during visit. Nothing mentioned in epic notes. She is also wanting to know if she should be experiencing pain w/ walking even in the cam boot.  Please advise.

## 2021-10-03 ENCOUNTER — Other Ambulatory Visit: Payer: Self-pay | Admitting: Podiatry

## 2021-10-03 ENCOUNTER — Telehealth: Payer: Self-pay | Admitting: Internal Medicine

## 2021-10-03 MED ORDER — ACETAMINOPHEN-CODEINE 300-30 MG PO TABS: 1.0000 | ORAL_TABLET | ORAL | 0 refills | Status: AC | PRN

## 2021-10-03 NOTE — Telephone Encounter (Signed)
Returned the call back to patient to give recommendations per physician and that something has been called in to pharmacy, could not leave a voice message, will try later.

## 2021-10-03 NOTE — Telephone Encounter (Signed)
Attempted to call pt at 5:45 PM after clinic, unable to see message or call pt sooner. No answer. No voicemail unable to leave message. Forwarding to triage as will not be in office tomorrow.

## 2021-10-03 NOTE — Telephone Encounter (Signed)
Patient c/o Palpitations:  High priority if patient c/o lightheadedness, shortness of breath, or chest pain  How long have you had palpitations/irregular HR/ Afib? Are you having the symptoms now? Palpitations   Are you currently experiencing lightheadedness, SOB or CP? No   Do you have a history of afib (atrial fibrillation) or irregular heart rhythm? Yes   Have you checked your BP or HR? (document readings if available): no   Are you experiencing any other symptoms?   Pt called stating she has been having palpitations and she went numb on the left side of her body yesterday.  Yesterday she said she had some cp and COB to where she couldn't really talk.  Pt made an appt for 10/08/21 with Sharolyn Douglas, NP.

## 2021-10-03 NOTE — Telephone Encounter (Signed)
Spoke with patient giving information /concerning her painful foot, that a pain medication has been sent to pharmacy, verbalized understanding.

## 2021-10-04 ENCOUNTER — Encounter: Payer: Self-pay | Admitting: *Deleted

## 2021-10-04 NOTE — Telephone Encounter (Signed)
Spoke w/ pt.  She reports that she has been having "really bad times". Her sx started last Sat, she reports that she took her meds and was moving around in her house when she started having a "really weird feeling" in her chest in which it felt like "the air was coming into my chest really slow like I was suffocating, my heart was jumping, going really fast and then really slow and my left side was numb and I was really confused on the left side of my head".  She did not check her BP or HR, so does not know if these were abnormal.  She had another episode on Wednesday this week.  She also reports that the left side of her neck hurts during these episodes. Advised pt to monitor her HR and keep her upcoming appt on Tues, but if she has another episode, to call 911.  She verbalizes understanding and is agreeable.  She is appreciative of the call.

## 2021-10-08 ENCOUNTER — Encounter: Payer: Self-pay | Admitting: Nurse Practitioner

## 2021-10-08 ENCOUNTER — Ambulatory Visit: Payer: 59 | Admitting: Nurse Practitioner

## 2021-10-08 ENCOUNTER — Encounter: Payer: Self-pay | Admitting: Internal Medicine

## 2021-10-08 NOTE — Telephone Encounter (Signed)
Attempted to call pt d/t nature of mychart message. No answer. No voicemail set up. Please see previous phone note. Pt was scheduled today to see Ignacia Bayley, NP for follow up and pt cancelled. See appt notes. Pt cancelled d/t schedule conflict. Will send mychart message to pt with ER precautions.

## 2021-10-08 NOTE — Progress Notes (Deleted)
Office Visit    Patient Name: Kimberly Kemp Date of Encounter: 10/08/2021  Primary Care Provider:  Martin Majestic, FNP Primary Cardiologist:  [name], MD  Chief Complaint    56 year old female with history of CAD status post PCI to the RCA in January 7510 complicated by iatrogenic dissection of the RCA, ischemic cardiomyopathy, hypertension, familial hyperlipidemia, stroke, questionable history of paroxysmal atrial fibrillation and pulmonary embolism, COPD, colon cancer, hepatitis B, anxiety, and tobacco abuse, presents for follow-up related to CAD.  Past Medical History    Past Medical History:  Diagnosis Date   ? h/o Paroxysmal atrial fibrillation [facility])    ? h/o Pulmonary embolism (HCC)    Anxiety    Asthma    Atypical chest pain    a. 09/2018 ETT: poor exercise tolerance; b. 11/2018 Dobutamine Echo: No wall motion abnormalities, EF 55-60%.   CAD (coronary artery disease)    a. 02/2019 Cath/PCI: LM nl, LAD nl, LCX nl, RCA 47m(attempted iFR->wire dissection-->3.0x38 Resolute Onyx DES prox, 2.75x38 Resolute Onyx DES mid), RPDA jailed by stent. EF 65%; b. 11/2019 MV: EF 55-65%. medium-sized apical defect - likely diaph atten. No ischemia-->low risk.   [place]    a. 03/2021 U/S: RICA 12-58% LICA 15-27% Bilat ECA <50%.   Chronic pain    Cirrhosis (HCC)    Colon cancer (HCC)    [place]D [place] disease[place]    COVID-19 virus infection 11/2019   Depression    Emphysema    GERD (gastroesophageal reflux disease)    Heart murmur    Hepatitis B    History of echocardiogram    a. 02/2019 Echo: EF 55-60%. No rwma. No significant valvular disease.   Hyperlipidemia    Hypertension    Palpitations    a. 02/2019 Zio: Sinus, avg 83 (55-135). Rare PACs/PVCs. Four atrial runs, longest 7 beats, fastest 146 bpm. Triggered event->sinus rhythm & artifact.   Stroke ([facility]    Tobacco abuse    Past Surgical History:  Procedure Laterality  Date   APPENDECTOMY     microscopic    BRONCHOSCOPY  March 2008, Feb 2012   CARDIAC CATHETERIZATION     CESAREAN SECTION     CORONARY STENT INTERVENTION N/A 02/22/2019   Procedure: CORONARY STENT INTERVENTION;  Surgeon: ENelva Bush MD;  [place] [facility];  Service: Cardiovascular;  Laterality: N/A;   INTRAVASCULAR PRESSURE WIRE/FFR STUDY N/A 02/22/2019   Procedure: INTRAVASCULAR PRESSURE WIRE/FFR STUDY;  Surgeon: ENelva Bush MD;  [place] [facility];  Service: Cardiovascular;  Laterality: N/A;   RIGHT/LEFT HEART CATH AND CORONARY ANGIOGRAPHY N/A 02/22/2019   Procedure: RIGHT/LEFT HEART CATH AND CORONARY ANGIOGRAPHY;  Surgeon: ENelva Bush MD;  [place] [facility];  Service: Cardiovascular;  Laterality: N/A;    Allergies  Allergies  Allergen Reactions   Penicillins Hives    Did it involve swelling of the face/tongue/throat, SOB, or low BP? No Did it involve sudden or severe rash/hives, skin peeling, or any reaction on the inside of your mouth or nose? Yes Did you need to seek medical attention at a hospital or doctor's office? Yes When did it last happen?      10 Years If all above answers are "NO", may proceed with cephalosporin use.    Omnipaque [Iohexol] Itching        Red Dye Itching    History of Present Illness    56year old female with above complex past  medical history including CAD, ischemic cardiomyopathy, hypertension, familial hyperlipidemia, stroke, questionable history of A-fib and PE, COPD, colon cancer, hepatitis B, anxiety, and tobacco abuse.  In the setting of ongoing chest pain despite normal exercise treadmill test in August 2020, and normal dobutamine echo in October 2020, she underwent diagnostic catheterization in January 2021, which was notable for moderate proximal RCA disease. iFR was attempted in the RCA and resulted in dissection and ST segment elevation.  She then underwent drug-eluting stent placement to  the proximal and mid RCA.  The RPDA was jailed by the stent.  Echo showed normal LV function with only a small region of akinesis.  She underwent repeat stress testing in October 2021, following an episode of severe chest pain.  Stress test was low risk with normal LV function.  Medium sized apical defect was noted, there was felt to be secondary to diaphragmatic attenuation.  She had a similar episode of chest tightness and dyspnea in January 2023, which resolved promptly with sublingual nitroglycerin.  Additional testing was deferred at that time given isolated nature of the episode.  Carotid ultrasound showed 1 to 39% bilateral internal carotid artery stenoses with flow disturbance in the left subclavian artery.  Kimberly Kemp was last seen in cardiology clinic in March 2023 at which time she reported an episode of palpitations lasting a few minutes and resolving spontaneously.  Metoprolol dose was increased to 50 mg twice daily (she is also on diltiazem).  She was noted to have normal bilateral upper extremity pulses and blood pressures and thus was not felt that she had any hemodynamically significant left subclavian artery stenosis.  Home Medications    Current Outpatient Medications  Medication Sig Dispense Refill   acetaminophen-codeine (TYLENOL #3) 300-30 MG tablet Take 1-2 tablets by mouth every 4 (four) hours as needed for moderate pain. 30 tablet 0   albuterol (PROVENTIL) (2.5 MG/3ML) 0.083% nebulizer solution TAKE 3 MLS (2.5 MG TOTAL) BY NEBULIZATION 4 TIMES DAILY AS NEEDED FOR WHEEZING SHORTNESS OF BREATH 375 mL 3   albuterol (VENTOLIN HFA) 108 (90 Base) MCG/ACT inhaler INHALE 2 PUFFS INTO THE LUNGS EVERY 4 HOURS AS NEEDED FOR WHEEZE OR FOR SHORTNESS OF BREATH 8.5 each 2   aspirin EC 81 MG tablet Take 81 mg by mouth daily.     budesonide (PULMICORT) 0.25 MG/2ML nebulizer solution USE 2ML (0.25 MG TOTAL) BY NEBULIZATION TWICE A DAY 360 mL 3   butalbital-acetaminophen-caffeine (FIORICET, ESGIC)  50-325-40 MG tablet Take 1 tablet by mouth 3 (three) times daily as needed for headache.  2   diltiazem (CARDIZEM CD) 180 MG 24 hr capsule TAKE 1 CAPSULE BY MOUTH EVERY DAY 90 capsule 0   ezetimibe (ZETIA) 10 MG tablet TAKE 1 TABLET BY MOUTH EVERY DAY 90 tablet 0   famotidine (PEPCID) 40 MG tablet Take 40 mg by mouth at bedtime.     gabapentin (NEURONTIN) 300 MG capsule Take 300 mg by mouth at bedtime.  1   ipratropium (ATROVENT) 0.02 % nebulizer solution TAKE 2.5 ML (0.5 MG TOTAL) BY NEBULIZATION 4 TIMES A DAY 312.5 mL 3   levofloxacin (LEVAQUIN) 500 MG tablet Take 1 tablet (500 mg total) by mouth daily. 7 tablet 0   magic mouthwash w/lidocaine SOLN Take 10 mLs by mouth 4 (four) times daily as needed for mouth pain. 120 mL 0   metoprolol tartrate (LOPRESSOR) 50 MG tablet TAKE 1 TABLET BY MOUTH TWICE A DAY 180 tablet 0   montelukast (SINGULAIR) 10 MG tablet  Take 10 mg by mouth daily.     nitroGLYCERIN (NITROSTAT) 0.4 MG SL tablet Place 1 tablet (0.4 mg total) under the tongue every 5 (five) minutes as needed for chest pain. Maximum of 3 doses. 25 tablet 0   nystatin (MYCOSTATIN) 100000 UNIT/ML suspension Take 5 mLs (500,000 Units total) by mouth 4 (four) times daily. 60 mL 0   Omega-3 Fatty Acids (FISH OIL PO) Take 1 capsule by mouth 2 (two) times daily.     omeprazole (PRILOSEC) 40 MG capsule TAKE 1 CAPSULE BY MOUTH EVERY DAY 30 capsule 2   oxyCODONE-acetaminophen (PERCOCET) 5-325 MG tablet Take 1 tablet by mouth every 4 (four) hours as needed for severe pain. 30 tablet 0   PAZEO 0.7 % SOLN Place 1 drop into both eyes daily.     pramipexole (MIRAPEX) 1 MG tablet Take 1 mg by mouth at bedtime.     rosuvastatin (CRESTOR) 40 MG tablet TAKE 1 TABLET BY MOUTH EVERY DAY 90 tablet 3   No current facility-administered medications for this visit.     Review of Systems    ***.  All other systems reviewed and are otherwise negative except as noted above.    Physical Exam    VS:  LMP 07/18/2016  ,  BMI There is no height or weight on file to calculate BMI.     GEN: Well nourished, well developed, in no acute distress. HEENT: normal. Neck: Supple, no JVD, carotid bruits, or masses. Cardiac: RRR, no murmurs, rubs, or gallops. No clubbing, cyanosis, edema.  Radials/DP/PT 2+ and equal bilaterally.  Respiratory:  Respirations regular and unlabored, clear to auscultation bilaterally. GI: Soft, nontender, nondistended, BS + x 4. MS: no deformity or atrophy. Skin: warm and dry, no rash. Neuro:  Strength and sensation are intact. Psych: Normal affect.  Accessory Clinical Findings    ECG personally reviewed by me today - *** - no acute changes.  Lab Results  Component Value Date   WBC 8.7 05/26/2021   HGB 14.1 05/26/2021   HCT 42.8 05/26/2021   MCV 92.0 05/26/2021   PLT 236 05/26/2021   Lab Results  Component Value Date   CREATININE 1.02 (H) 05/26/2021   BUN 18 05/26/2021   NA 141 05/26/2021   K 3.9 05/26/2021   CL 113 (H) 05/26/2021   CO2 23 05/26/2021   Lab Results  Component Value Date   ALT 30 11/08/2020   AST 32 11/08/2020   ALKPHOS 123 11/08/2020   BILITOT 0.5 11/08/2020   Lab Results  Component Value Date   CHOL 170 11/08/2020   HDL 67 11/08/2020   LDLCALC 65 11/08/2020   TRIG 192 (H) 11/08/2020   CHOLHDL 2.5 11/08/2020    No results found for: "HGBA1C"  Assessment & Plan    1.  ***   Murray Hodgkins, NP 10/08/2021, 7:33 AM

## 2021-10-14 ENCOUNTER — Other Ambulatory Visit: Payer: Self-pay | Admitting: Internal Medicine

## 2021-10-26 ENCOUNTER — Other Ambulatory Visit: Payer: Self-pay | Admitting: Internal Medicine

## 2021-11-03 ENCOUNTER — Other Ambulatory Visit: Payer: Self-pay | Admitting: Internal Medicine

## 2021-11-03 ENCOUNTER — Other Ambulatory Visit: Payer: Self-pay | Admitting: Pulmonary Disease

## 2021-11-03 DIAGNOSIS — E7801 Familial hypercholesterolemia: Secondary | ICD-10-CM

## 2021-11-03 DIAGNOSIS — J432 Centrilobular emphysema: Secondary | ICD-10-CM

## 2021-11-03 DIAGNOSIS — J441 Chronic obstructive pulmonary disease with (acute) exacerbation: Secondary | ICD-10-CM

## 2021-11-06 ENCOUNTER — Other Ambulatory Visit: Payer: Self-pay | Admitting: Internal Medicine

## 2021-11-08 ENCOUNTER — Ambulatory Visit: Payer: 59 | Attending: Nurse Practitioner | Admitting: Cardiology

## 2021-11-08 NOTE — Progress Notes (Deleted)
Cardiology Clinic Note   Patient Name: Kimberly Kemp Date of Encounter: 11/08/2021  Primary Care Provider:  Martin Majestic, FNP Primary Cardiologist:  [name], MD  Patient Profile    56 year old female with a history of CAD status post PCI to the RCA in January 0626 complicated by iatrogenic dissection of the RCA, ischemic cardiomyopathy, hypertension, familial hyperlipidemia, stroke, questionable history of paroxysmal atrial fibrillation and pulmonary embolism, subclavian artery stenosis, COPD, colon cancer, hepatitis B, anxiety, and tobacco abuse, who presents for follow-up of her coronary artery disease.  Past Medical History    Past Medical History:  Diagnosis Date   ? h/o Paroxysmal atrial fibrillation [facility])    ? h/o Pulmonary embolism (HCC)    Anxiety    Asthma    Atypical chest pain    a. 09/2018 ETT: poor exercise tolerance; b. 11/2018 Dobutamine Echo: No wall motion abnormalities, EF 55-60%.   CAD (coronary artery disease)    a. 02/2019 Cath/PCI: LM nl, LAD nl, LCX nl, RCA 58m(attempted iFR->wire dissection-->3.0x38 Resolute Onyx DES prox, 2.75x38 Resolute Onyx DES mid), RPDA jailed by stent. EF 65%; b. 11/2019 MV: EF 55-65%. medium-sized apical defect - likely diaph atten. No ischemia-->low risk.   Carotid arterial disease ([facility]    a. 03/2021 U/S: RICA 19-48% LICA 15-46% Bilat ECA <50%.   Chronic pain    Cirrhosis (HCC)    Colon cancer (HCC)    COPD (chronic obstructive pulmonary disease) ([facility] [place]    COVID-19 virus infection 11/2019   Depression    Emphysema    GERD (gastroesophageal reflux disease)    Heart murmur    Hepatitis B    History of echocardiogram    a. 02/2019 Echo: EF 55-60%. No rwma. No significant valvular disease.   Hyperlipidemia    Hypertension    Palpitations    a. 02/2019 Zio: Sinus, avg 83 (55-135). Rare PACs/PVCs. Four atrial runs, longest 7 beats, fastest 146 bpm. Triggered event->sinus rhythm & artifact.   Stroke ([facility]     Tobacco abuse    Past Surgical History:  Procedure Laterality Date   APPENDECTOMY     microscopic    BRONCHOSCOPY  March 2008, Feb 2012   CARDIAC CATHETERIZATION     CESAREAN SECTION     CORONARY STENT INTERVENTION N/A 02/22/2019   Procedure: CORONARY STENT INTERVENTION;  Surgeon: ENelva Bush MD;  [facility];  Service: Cardiovascular;  Laterality: N/A;   INTRAVASCULAR PRESSURE WIRE/FFR STUDY N/A 02/22/2019   Procedure: INTRAVASCULAR PRESSURE WIRE/FFR STUDY;  Surgeon: ENelva Bush MD;  [facility];  Service: Cardiovascular;  Laterality: N/A;   RIGHT/LEFT HEART CATH AND CORONARY ANGIOGRAPHY N/A 02/22/2019   Procedure: RIGHT/LEFT HEART CATH AND CORONARY ANGIOGRAPHY;  Surgeon: ENelva Bush MD;  [facility];  Service: Cardiovascular;  Laterality: N/A;    Allergies  Allergies  Allergen Reactions   Penicillins Hives    Did it involve swelling of the face/tongue/throat, SOB, or low BP? No Did it involve sudden or severe rash/hives, skin peeling, or any reaction on the inside of your mouth or nose? Yes Did you need to seek medical attention at a hospital or doctor's office? Yes When did it last happen?      10 Years If all above answers are "NO", may proceed with cephalosporin use.    Omnipaque [Iohexol] Itching        Red Dye Itching    History of Present Illness  56 year old female with the above complex past medical history including coronary artery disease, ischemic cardiomyopathy, hypertension, familial hyperlipidemia, stroke, questionable history of atrial fibrillation with pulmonary embolism, COPD, colon cancer, hepatitis B, anxiety, and ongoing tobacco abuse.  In the setting of ongoing chest pain despite normal ETT in August 2020 and normal dobutamine stress echo in October 2020 she underwent diagnostic catheterization in February 10, 2019, which was notable for moderate proximal RCA disease.  iFR was attempted  in the RCA resulted in dissection and ST segment elevation.  She then underwent drug-eluting stent placement to the proximal and mid RCA.  The RPDA was jailed by the stent.  Echo showed normal LV function with a small region of akinesis.  She did note occasional fleeting chest pain and follow-up visit following PCI and on the following visit she had in October, she reported an episode of severe chest pressure that occurred at rest and resolved with sublingual nitroglycerin.  Stress testing was undertaken and was low risk, showing an EF of 55-65% with medium size apical defect, which was felt to be diaphragmatic attenuation.  She was last seen in the office 04/25/2021 by Dr. Saunders Revel with concerns of palpitations and left arm pain/numbness.  She reported intermittent pain in her left arm which comes and goes.  It often begins near the shoulder radiates down her arm and is accompanied by numbness.  She recently underwent carotid Doppler in 03/2021 which showed turbulent flow noted in the left subclavian artery, though antegrade vertebral artery flow was observed.  No blood pressure discrepancy noted today between right and left arms.  This is also compared to CT of the chest with contrast she had an 2022 and she was continued on aspirin and aggressive lipid control.  She returns to clinic today  Home Medications    Current Outpatient Medications  Medication Sig Dispense Refill   acetaminophen-codeine (TYLENOL #3) 300-30 MG tablet Take 1-2 tablets by mouth every 4 (four) hours as needed for moderate pain. 30 tablet 0   albuterol (PROVENTIL) (2.5 MG/3ML) 0.083% nebulizer solution TAKE 3 MLS (2.5 MG TOTAL) BY NEBULIZATION 4 TIMES DAILY AS NEEDED FOR WHEEZING SHORTNESS OF BREATH 375 mL 3   albuterol (VENTOLIN HFA) 108 (90 Base) MCG/ACT inhaler INHALE 2 PUFFS INTO THE LUNGS EVERY 4 HOURS AS NEEDED FOR WHEEZE OR FOR SHORTNESS OF BREATH 8.5 each 2   aspirin EC 81 MG tablet Take 81 mg by mouth daily.     budesonide  (PULMICORT) 0.25 MG/2ML nebulizer solution USE 2ML (0.25 MG TOTAL) BY NEBULIZATION TWICE A DAY 360 mL 3   butalbital-acetaminophen-caffeine (FIORICET, ESGIC) 50-325-40 MG tablet Take 1 tablet by mouth 3 (three) times daily as needed for headache.  2   diltiazem (CARDIZEM CD) 180 MG 24 hr capsule TAKE 1 CAPSULE BY MOUTH EVERY DAY 30 capsule 0   ezetimibe (ZETIA) 10 MG tablet TAKE 1 TABLET BY MOUTH EVERY DAY 30 tablet 0   famotidine (PEPCID) 40 MG tablet Take 40 mg by mouth at bedtime.     gabapentin (NEURONTIN) 300 MG capsule Take 300 mg by mouth at bedtime.  1   ipratropium (ATROVENT) 0.02 % nebulizer solution TAKE 2.5 ML (0.5 MG TOTAL) BY NEBULIZATION 4 TIMES A DAY 312.5 mL 3   levofloxacin (LEVAQUIN) 500 MG tablet Take 1 tablet (500 mg total) by mouth daily. 7 tablet 0   magic mouthwash w/lidocaine SOLN Take 10 mLs by mouth 4 (four) times daily as needed for mouth pain. 120 mL 0  metoprolol tartrate (LOPRESSOR) 50 MG tablet TAKE 1 TABLET BY MOUTH TWICE A DAY 60 tablet 0   montelukast (SINGULAIR) 10 MG tablet Take 10 mg by mouth daily.     nitroGLYCERIN (NITROSTAT) 0.4 MG SL tablet Place 1 tablet (0.4 mg total) under the tongue every 5 (five) minutes as needed for chest pain. Maximum of 3 doses. 25 tablet 0   nystatin (MYCOSTATIN) 100000 UNIT/ML suspension Take 5 mLs (500,000 Units total) by mouth 4 (four) times daily. 60 mL 0   Omega-3 Fatty Acids (FISH OIL PO) Take 1 capsule by mouth 2 (two) times daily.     omeprazole (PRILOSEC) 40 MG capsule TAKE 1 CAPSULE BY MOUTH EVERY DAY 30 capsule 2   oxyCODONE-acetaminophen (PERCOCET) 5-325 MG tablet Take 1 tablet by mouth every 4 (four) hours as needed for severe pain. 30 tablet 0   PAZEO 0.7 % SOLN Place 1 drop into both eyes daily.     pramipexole (MIRAPEX) 1 MG tablet Take 1 mg by mouth at bedtime.     rosuvastatin (CRESTOR) 40 MG tablet TAKE 1 TABLET BY MOUTH EVERY DAY 90 tablet 3   No current facility-administered medications for this visit.      Family History    Family History  Problem Relation Age of Onset   Emphysema Father    Prostate cancer Father    Liver disease Father    Emphysema Maternal Grandmother    Diabetes Mother    Heart disease Mother 68       CABG and valve replacement   Colon cancer Neg Hx    She indicated that her mother is deceased. She indicated that her father is deceased. She indicated that the status of her maternal grandmother is unknown. She indicated that the status of her neg hx is unknown.  Social History    Social History   Socioeconomic History   Marital status: Married    Spouse name: Not on file   Number of children: 4   Years of education: Not on file   Highest education level: Not on file  Occupational History    Employer: UNEMPLOYED    Comment: on disability  Tobacco Use   Smoking status: Every Day    Packs/day: 0.50    Years: 43.00    Total pack years: 21.50    Types: Cigarettes   Smokeless tobacco: Never   Tobacco comments:    5 per day-06/06/2021  Vaping Use   Vaping Use: Never used  Substance and Sexual Activity   Alcohol use: No    Alcohol/week: 0.0 standard drinks of alcohol   Drug use: No   Sexual activity: Not on file  Other Topics Concern   Not on file  Social History Narrative   Lives in [place] with husband and daughter, on disability. Grandchildren visit daily.   Social Determinants of Health   Financial Resource Strain: Not on file  Food Insecurity: Not on file  Transportation Needs: Not on file  Physical Activity: Not on file  Stress: Not on file  Social Connections: Not on file  Intimate Partner Violence: Not on file     Review of Systems    General:  No chills, fever, night sweats or weight changes.  Cardiovascular:  No chest pain, dyspnea on exertion, edema, orthopnea, palpitations, paroxysmal nocturnal dyspnea. Dermatological: No rash, lesions/masses Respiratory: No cough, dyspnea Urologic: No hematuria, dysuria Abdominal:   No  nausea, vomiting, diarrhea, bright red blood per rectum, melena, or hematemesis Neurologic:  No visual changes, wkns, changes in mental status. All other systems reviewed and are otherwise negative except as noted above.     Physical Exam    VS:  LMP 07/18/2016  , BMI There is no height or weight on file to calculate BMI.     GEN: Well nourished, well developed, in no acute distress. HEENT: normal. Neck: Supple, no JVD, carotid bruits, or masses. Cardiac: RRR, no murmurs, rubs, or gallops. No clubbing, cyanosis, edema.  Radials/DP/PT 2+ and equal bilaterally.  Respiratory:  Respirations regular and unlabored, clear to auscultation bilaterally. GI: Soft, nontender, nondistended, BS + x 4. MS: no deformity or atrophy. Skin: warm and dry, no rash. Neuro:  Strength and sensation are intact. Psych: Normal affect.  Accessory Clinical Findings    ECG personally reviewed by me today- *** - No acute changes  Lab Results  Component Value Date   WBC 8.7 05/26/2021   HGB 14.1 05/26/2021   HCT 42.8 05/26/2021   MCV 92.0 05/26/2021   PLT 236 05/26/2021   Lab Results  Component Value Date   CREATININE 1.02 (H) 05/26/2021   BUN 18 05/26/2021   NA 141 05/26/2021   K 3.9 05/26/2021   CL 113 (H) 05/26/2021   CO2 23 05/26/2021   Lab Results  Component Value Date   ALT 30 11/08/2020   AST 32 11/08/2020   ALKPHOS 123 11/08/2020   BILITOT 0.5 11/08/2020   Lab Results  Component Value Date   CHOL 170 11/08/2020   HDL 67 11/08/2020   LDLCALC 65 11/08/2020   TRIG 192 (H) 11/08/2020   CHOLHDL 2.5 11/08/2020    No results found for: "HGBA1C"  Assessment & Plan   1.  ***  Sareen Randon, NP 11/08/2021, 8:09 AM

## 2021-11-11 ENCOUNTER — Encounter: Payer: Self-pay | Admitting: Cardiology

## 2021-11-22 ENCOUNTER — Encounter: Payer: Self-pay | Admitting: *Deleted

## 2021-11-27 ENCOUNTER — Encounter: Payer: 59 | Admitting: Nurse Practitioner

## 2022-01-08 ENCOUNTER — Other Ambulatory Visit: Payer: Self-pay | Admitting: Internal Medicine

## 2022-01-18 ENCOUNTER — Other Ambulatory Visit: Payer: Self-pay | Admitting: Nurse Practitioner

## 2022-03-20 ENCOUNTER — Other Ambulatory Visit: Payer: Self-pay | Admitting: Pulmonary Disease

## 2022-03-20 ENCOUNTER — Telehealth: Payer: Self-pay | Admitting: Internal Medicine

## 2022-03-20 MED ORDER — DILTIAZEM HCL ER COATED BEADS 180 MG PO CP24: ORAL_CAPSULE | ORAL | 0 refills | Status: DC

## 2022-03-20 MED ORDER — METOPROLOL TARTRATE 50 MG PO TABS: 50.0000 mg | ORAL_TABLET | Freq: Two times a day (BID) | ORAL | 0 refills | Status: DC

## 2022-03-20 MED ORDER — EZETIMIBE 10 MG PO TABS: 10.0000 mg | ORAL_TABLET | Freq: Every day | ORAL | 0 refills | Status: DC

## 2022-03-20 NOTE — Telephone Encounter (Signed)
*  STAT* If patient is at the pharmacy, call can be transferred to refill team.   1. Which medications need to be refilled? (please list name of each medication and dose if known)   ezetimibe (ZETIA) 10 MG tablet  metoprolol tartrate (LOPRESSOR) 50 MG tablet  diltiazem (CARDIZEM CD) 180 MG 24 hr capsule   2. Which pharmacy/location (including street and city if local pharmacy) is medication to be sent to?  [id number]- [place], [place] - [address]   3. Do they need a 30 day or 90 day supply?   90 day  Patient stated she is almost completely out of this medication.  Patient has appointment scheduled on 4/19.

## 2022-03-25 ENCOUNTER — Telehealth: Payer: Self-pay | Admitting: Internal Medicine

## 2022-03-25 MED ORDER — NITROGLYCERIN 0.4 MG SL SUBL: 0.4000 mg | SUBLINGUAL_TABLET | SUBLINGUAL | 3 refills | Status: DC | PRN

## 2022-03-25 NOTE — Telephone Encounter (Signed)
Pt c/o of Chest Pain: STAT if CP now or developed within 24 hours  1. Are you having CP right now? No   2. Are you experiencing any other symptoms (ex. SOB, nausea, vomiting, sweating)? Left arm and shoulder hurting   3. How long have you been experiencing CP? Started about 10 mins ago   4. Is your CP continuous or coming and going? Coming and going  5. Have you taken Nitroglycerin? No, accidentally washed and dried nitroglycerin so she took two baby aspirin ?   Call transferred to [facility] triage due to being STAT

## 2022-03-25 NOTE — Telephone Encounter (Signed)
Spoke with patient and reviewed her symptoms and concerns. Sent in refill of her nitro as well. We discussed her chest pain. She also reports left arm and shoulder discomfort. Instructed her to go to ED for further evaluation. She verbalized understating with no further questions at this time.

## 2022-03-26 ENCOUNTER — Ambulatory Visit: Payer: 59 | Admitting: Physician Assistant

## 2022-03-26 NOTE — Telephone Encounter (Signed)
Left voicemail message to call back  

## 2022-03-26 NOTE — Telephone Encounter (Signed)
Spoke with patient to let her know that if she should go to ED if her symptoms should persist or worsen. We had her scheduled today but that did not work out due to transportation problems. She did schedule next available which will be in April. She verbalized understanding of our conversation with no further questions at this time.

## 2022-03-26 NOTE — Telephone Encounter (Signed)
Called patient to check on her. She reports that it still comes and goes. Offered appointment today with APP (No DOD slot) and she was a little hesitant stating that she normally only sees Dr. Saunders Revel. Encouraged her to come in per Dr. Darnelle Bos recommendations and she verbalized understanding. Scheduled her appointment and she stated if she is not able to call she would give [place] a call back.

## 2022-03-26 NOTE — Telephone Encounter (Signed)
Patient wasn't able to find a ride, she can only do afternoon appt. So the next available was 4/9. Please advise

## 2022-03-26 NOTE — Progress Notes (Deleted)
Cardiology Office Note    Date:  03/26/2022   ID:  Mera, Zubek 1965/05/24, MRN [id number]  PCP:  Martin Majestic, FNP  Cardiologist:  [name], MD  Electrophysiologist:  None   Chief Complaint: Chest pain  History of Present Illness:   Kimberly Kemp is a 57 y.o. female with history of CAD status post PCI to the RCA in the setting of iatrogenic dissection in 02/2019, ICM, CVA, questionable PAF, questionable PE, colon cancer, hepatitis B, left subclavian artery stenosis, HTN, familial hyperlipidemia, carotid artery stenosis, COPD, tobacco use, and anxiety who presents for evaluation of ***  She was evaluated as a new patient in 09/2018 for chest pain and dyspnea.  Subsequent ETT from 09/2018 was low to intermediate risk with decreased exercise capacity and motion artifact noted.  Given artifact noted, she underwent Dobutamine stress echo from 11/2018 was without evidence of significant ischemia with preserved LV systolic function.  Outpatient cardiac monitoring from 01/2019 showed a predominant rhythm of sinus with rare PACs and PVCs as well as rare episodes of brief PSVT lasting up to 7 beats.  Patient triggered events corresponded to sinus rhythm.  With continued symptoms of dyspnea and intermittent chest tightness and palpitations, she underwent R/LHC in 02/2019 that showed single-vessel CAD with 60 to 70% mid RCA stenosis.  Attempted iFR of this lesion led to wire dissection extending from the ostium through the distal RCA and occlusion of the PDA.  The RCA dissection was treated with overlapping resolute Onyx stents x 2.  There was no significant disease involving the left coronary tree.  Normal left heart filling pressures with mildly elevated right heart filling pressures as well as normal pulmonary artery pressure and supranormal cardiac output/index.  Echo at that time showed an EF of 55 to 60%, akinesis of the LV inferoseptal wall, normal RV systolic function and ventricular  cavity size, normal PASP, and no significant valvular abnormalities.  Most recent ischemic evaluation in 11/2019 vita Lexiscan MPI showed a medium size fixed perfusion defect involving the apex felt to likely represent artifact as there was significant diaphragmatic attenuation.  LVSF 55-65%.  Overall, this was a low risk study.  Carotid artery ultrasound from 03/2021 showed 1-39% bilateral ICA stenosis with antegrade flow of the bilateral vertebral arteries with disturbed flow in the left subclavian artery and normal flow in the right side.  She has had noted recurrent episodes of dyspnea and chest tightness intermittently in follow-up visits.  She was last seen in the office in 04/2021 noting paroxysms of palpitations and dyspnea.  In this setting, Lopressor was titrated to 50 mg twice daily with continuation of diltiazem.  There was no noted discrepancy between left and right upper extremity blood pressures with regards to her left subclavian artery stenosis.  She contacted our office in 8 and 10/2021 noting chest discomfort and palpitations.  Several appointments have either been canceled or no showed.  She contacted our office yesterday noting intermittent chest discomfort with recommendation for the patient to go to the ED.  However, it appears she did not.  In this setting, appointment was recommended for today unless she was having active chest pain, for which ED evaluation was recommended.  ***   Labs independently reviewed: 05/2021 - potassium 3.9, BUN 18, SCr 1.02, Hgb 14.1, PLT 236 12/2020 - A1c 5.6%, TC 169, TG 62, HDL 63, LDL 94, albumin 4.4, AST/ALT normal 05/2019 - TSH normal  Past Medical History:  Diagnosis Date   ?  h/o Paroxysmal atrial fibrillation (HCC)    ? h/o Pulmonary embolism (HCC)    Anxiety    Asthma    Atypical chest pain    a. 09/2018 ETT: poor exercise tolerance; b. 11/2018 Dobutamine Echo: No wall motion abnormalities, EF 55-60%.   CAD (coronary artery disease)    a.  02/2019 Cath/PCI: LM nl, LAD nl, LCX nl, RCA 52m(attempted iFR->wire dissection-->3.0x38 Resolute Onyx DES prox, 2.75x38 Resolute Onyx DES mid), RPDA jailed by stent. EF 65%; b. 11/2019 MV: EF 55-65%. medium-sized apical defect - likely diaph atten. No ischemia-->low risk.   Carotid arterial disease ([facility]    a. 03/2021 U/S: RICA 1123456 LICA [id number] Bilat ECA <50%.   Chronic pain    Cirrhosis (HCC)    Colon cancer (HCC)    [place]    COVID-19 virus infection 11/2019   Depression    Emphysema    GERD (gastroesophageal reflux disease)    Heart murmur    Hepatitis B    History of echocardiogram    a. 02/2019 Echo: EF 55-60%. No rwma. No significant valvular disease.   Hyperlipidemia    Hypertension    Palpitations    a. 02/2019 Zio: Sinus, avg 83 (55-135). Rare PACs/PVCs. Four atrial runs, longest 7 beats, fastest 146 bpm. Triggered event->sinus rhythm & artifact.   Stroke ([facility]    Tobacco abuse     Past Surgical History:  Procedure Laterality Date   APPENDECTOMY     microscopic    BRONCHOSCOPY  March 2008, Feb 2012   CARDIAC CATHETERIZATION     CESAREAN SECTION     CORONARY STENT INTERVENTION N/A 02/22/2019   Procedure: CORONARY STENT INTERVENTION;  Surgeon: ENelva Bush MD;  [facility];  Service: Cardiovascular;  Laterality: N/A;   INTRAVASCULAR PRESSURE WIRE/FFR STUDY N/A 02/22/2019   Procedure: INTRAVASCULAR PRESSURE WIRE/FFR STUDY;  Surgeon: ENelva Bush MD;  [facility];  Service: Cardiovascular;  Laterality: N/A;   RIGHT/LEFT HEART CATH AND CORONARY ANGIOGRAPHY N/A 02/22/2019   Procedure: RIGHT/LEFT HEART CATH AND CORONARY ANGIOGRAPHY;  Surgeon: ENelva Bush MD;  [facility];  Service: Cardiovascular;  Laterality: N/A;    Current Medications: No outpatient medications have been marked as taking for the 03/26/22 encounter (Appointment) with DRise Mu PA-C.     Allergies:   Penicillins, Omnipaque [iohexol], and Red dye   Social History   Socioeconomic History   Marital status: Married    Spouse name: Not on file   Number of children: 4   Years of education: Not on file   Highest education level: Not on file  Occupational History    Employer: UNEMPLOYED    Comment: on disability  Tobacco Use   Smoking status: Every Day    Packs/day: 0.50    Years: 43.00    Total pack years: 21.50    Types: Cigarettes   Smokeless tobacco: Never   Tobacco comments:    5 per day-06/06/2021  Vaping Use   Vaping Use: Never used  Substance and Sexual Activity   Alcohol use: No    Alcohol/week: 0.0 standard drinks of alcohol   Drug use: No   Sexual activity: Not on file  Other Topics Concern   Not on file  Social History Narrative   [place] Grandchildren visit daily.   Social Determinants of Health   Financial Resource Strain: Not on file  Food  Insecurity: Not on file  Transportation Needs: Not on file  Physical Activity: Not on file  Stress: Not on file  Social Connections: Not on file     Family History:  The patient's family history includes Diabetes in her mother; Emphysema in her father and maternal grandmother; Heart disease (age of onset: 62) in her mother; Liver disease in her father; Prostate cancer in her father. There is no history of Colon cancer.  ROS:   12-point review of systems is negative unless otherwise noted in HPI.   EKGs/Labs/Other Studies Reviewed:    Studies reviewed were summarized above. The additional studies were reviewed today:  Carotid artery ultrasound 03/21/2021: Summary:  Right Carotid: Velocities in the right ICA are consistent with a 1-39%  stenosis.  Non-hemodynamically significant plaque <50% noted in the  CCA.  The ECA appears <50% stenosed.   Left Carotid: Velocities in the left ICA are consistent with a 1-39%  stenosis.  Non-hemodynamically  significant plaque <50% noted in the  CCA.  The ECA appears <50% stenosed.   Vertebrals:  Bilateral vertebral arteries demonstrate antegrade flow.  Subclavians: Left subclavian artery flow was disturbed. Normal flow  hemodynamics were seen in the right subclavian artery.  __________  [facility] MPI 11/16/2019: There was no ST segment deviation noted during stress. There is a medium size fixed perfusion defect involving the apex. This likely represents artifact as there is significant diaphragmatic attenuations This is a low risk study. The left ventricular ejection fraction is normal (55-65%). There was no evidence for ischemia __________  2D echo 02/23/2019: 1. Left ventricular ejection fraction, by visual estimation, is 55 to  60%. The left ventricle has normal function. There is no left ventricular  hypertrophy.   2. The left ventricle demonstrates regional wall motion abnormalities.   3. Akinesis noted in the LV inferoseptal wall.   4. Global right ventricle has normal systolic function.The right  ventricular size is normal. No increase in right ventricular wall  thickness.   5. Left atrial size was normal.   6. Right atrial size was normal.   7. The mitral valve is normal in structure. No evidence of mitral valve  regurgitation.   8. The tricuspid valve is normal in structure.   9. The tricuspid valve is normal in structure. Tricuspid valve  regurgitation is not demonstrated.  10. The aortic valve is normal in structure. Aortic valve regurgitation is  not visualized.  11. The pulmonic valve was not well visualized. Pulmonic valve  regurgitation is not visualized.  12. Normal pulmonary artery systolic pressure. __________  [facility] 02/22/2019: Conclusions: Single vessel coronary artery disease with 60-70% mid RCA stenosis.  Attempted iFR of this lesion led to wire dissection extending from the ostial through the distal RCA and occlusion of PDA.  RCA dissection was treated with  overlapping Resolute Onyx 2.75 x 38 mm and 3.0 x 38 mm drug-eluting stents (post-dilated to 3.1 mm). No significant disease involving the left coronary artery. Normal left heart filling pressures. Mildly elevated right heart filling pressures. Normal pulmonary artery pressure with mild transpulmonic gradient. Supranormal Fick cardiac output/index.   Recommendations: Admit to ICU for post-PCI management of chest pain due to occluded rPDA.  Titrate NTG infusion as blood pressure tolerates for relief of chest pain. Dual antiplatelet therapy with aspirin and ticagrelor for at least 12 months, ideally longer given long stented segment of the RCA. Aggressive secondary prevention, including high-intensity statin therapy and follow-up with the lipid clinic after discharge. __________  Zio patch 01/2019: The patient was monitored for 13 days, 20 hours. The predominant rhythm was sinus with an average rate of 83 bpm (range 55-135 bpm in sinus). There were rare PAC's and PVC's. Four atrial runs lasting up to 7 beats were observed with a maximum rate of 146 bpm. No sustained arrhythmia or prolonged pause was noted. Patient triggered events correspond to sinus rhythm and artifact.   Predominantly sinus rhythm with rare PAC's and PVC's, as well as rare episodes of brief PSVT.  Patient triggered events correspond to sinus rhythm. __________  Dobutamine stress test 11/16/2018: 1. Left ventricular ejection fraction, by visual estimation, is 55 to  60%. The left ventricle has normal function. There is no left ventricular  hypertrophy.   2. Global right ventricle has normal systolic function.The right  ventricular size is normal. No increase in right ventricular wall  thickness.   3. Left atrial size was normal.   4. Right atrial size was normal.   5. The mitral valve was not assessed. No evidence of mitral valve  regurgitation.   6. The tricuspid valve is not assessed. Tricuspid valve regurgitation  was  not visualized by color flow Doppler.   7. The aortic valve was not assessed Aortic valve regurgitation was not  visualized by color flow Doppler.   8. The pulmonic valve was normal in structure. Pulmonic valve  regurgitation is not visualized by color flow Doppler.   9. Aortic root could not be assessed.  10. Normal pulmonary artery systolic pressure.  11. Low risk, negative dobutamine stress test.  12. Negative stress echo for ischemia.  13. Post-stress: left ventricular systolic function was [id number].  14. Pre-stress: left ventricular systolic function was 0000000.  15. The interatrial septum was not assessed.  16. The Stress test was discontinued at stage 3 due to target heart rate  achieved.  __________  ETT 09/20/2018: Baseline EKG demonstrates normal sinus rhythm. The patient demonstrates decreased exercise capacity with hypertensive blood pressure response. No chest pain was reported during stress. No significant ST segment or T wave changes were seen during stress or recovery, though stress tracings are severely degraded by motion artifact. Low to intermediate risk exercise tolerance test (Duke treadmill score 4-5).   Low to intermediate risk exercise tolerance test with decreased exercise capacity and significant motion artifact.    EKG:  EKG is ordered today.  The EKG ordered today demonstrates ***  Recent Labs: 05/26/2021: BUN 18; Creatinine, Ser 1.02; Hemoglobin 14.1; Platelets 236; Potassium 3.9; Sodium 141  Recent Lipid Panel    Component Value Date/Time   CHOL 170 11/08/2020 0854   TRIG 192 (H) 11/08/2020 0854   HDL 67 11/08/2020 0854   CHOLHDL 2.5 11/08/2020 0854   VLDL 38 11/08/2020 0854   LDLCALC 65 11/08/2020 0854    PHYSICAL EXAM:    VS:  LMP 07/18/2016   BMI: There is no height or weight on file to calculate BMI.  Physical Exam  Wt Readings from Last 3 Encounters:  08/03/21 118 lb (53.5 kg)  06/30/21 120 lb (54.4 kg)  06/15/21 121 lb 4.1 oz (55  kg)     ASSESSMENT & PLAN:   CAD involving the native coronary arteries with ***:  Palpitations and PSVT:  Left subclavian artery stenosis:  Carotid artery stenosis:  HTN: Blood pressure  HLD: LDL 94.   {Are you ordering a CV Procedure (e.g. stress test, cath, DCCV, TEE, etc)?   Press F2        :  [id number]     Disposition: F/u with Dr. Saunders Revel or an APP in ***.   Medication Adjustments/Labs and Tests Ordered: Current medicines are reviewed at length with the patient today.  Concerns regarding medicines are outlined above. Medication changes, Labs and Tests ordered today are summarized above and listed in the Patient Instructions accessible in Encounters.   Melvern Banker, PA-C 03/26/2022 10:29 AM     [facility] [address] [place] [address] [place],  [address] [phone]

## 2022-03-26 NOTE — Telephone Encounter (Signed)
Thank you for the update.  I does not appear that Kimberly Kemp went to the ED (at least not that it is visible in our system).  Can you reach out to her to see how she is doing and potentially schedule her to see the DOD today for further evaluation?  If she is still have active chest pain, she should be advised to go to the nearest ED.  [name], MD [facility]

## 2022-04-23 ENCOUNTER — Telehealth: Payer: Self-pay | Admitting: Internal Medicine

## 2022-04-23 NOTE — Telephone Encounter (Signed)
Arm Pain-A-AH  Initial Assessment Questions   1. ONSET: "It just started hurting" 2. LOCATION: "My left arm and shoulder" 3. PAIN: "It a dull achy pain, sometimes sharp but just in my arm, nowhere else" 4. WORK OR EXERCISE: "No not that I can think of" 5. CAUSE: "I have no idea" 6. OTHER SYMPTOMS: "I have some nausea but that's it"  7. PREGNANCY: N/A  Chest Pain-A-AH  Initial Assessment Questions  1. LOCATION: "It doesn't hurt in my chest, just in my arm"  2. RADIATION: "It hurts into my shoulder" 3. ONSET: "Not very long at all just a little while ago it started hurting" 4. PATTERN: "It is steady" 5. DURATION: "Its still going on" 6. SEVERITY: "Its probably a 4 or 5" 7. CARDIAC RISK FACTORS: "I have high blood pressure but I've been taking my meds as normal" 8. PULMONARY RISK FACTORS: "I have COPD" 9. CAUSE: "What do you think is causing the chest pain?" 10. OTHER SYMPTOMS: "Just a little nausea" 11. PREGNANCY: N/A  Care Advice:  1: HOME CARE: * You should be able to treat this at home. 2: PAIN MEDICINES: * For pain relief, you can take either acetaminophen, ibuprofen, or naproxen. * They are over-the-counter (OTC) pain drugs. You can buy them at the drugstore. * ACETAMINOPHEN - REGULAR STRENGTH TYLENOL: Take 650 mg (two 325 mg pills) by mouth every 4 to 6 hours as needed. Each Regular Strength Tylenol pill has 325 mg of acetaminophen. The most you should take is 10 pills a day (3,250 mg total). Note: In [place], the maximum is 12 pills a day (3,900 mg total). * ACETAMINOPHEN - EXTRA STRENGTH TYLENOL: Take 1,000 mg (two 500 mg pills) every 6 to 8 hours as needed. Each Extra Strength Tylenol pill has 500 mg of acetaminophen. The most you should take is 6 pills a day (3,000 mg total). Note: In [place], the maximum is 8 pills a day (4,000 mg total). * IBUPROFEN (E.G., MOTRIN, ADVIL): Take 400 mg (two 200 mg pills) by mouth every 6 hours. The most you should take is 6 pills a day  (1,200 mg total). * NAPROXEN (E.G., ALEVE): Take 220 mg (one 220 mg pill) by mouth every 8 to 12 hours as needed. You may take 440 mg (two 220 mg pills) for your first dose. The most you should take is 3 pills a day (660 mg total). Note: In [place], the maximum is 2 pills a day (one every 12 hours; 440 mg total). * Use the lowest amount of medicine that makes your pain better. 3: PAIN MEDICINES - EXTRA NOTES AND WARNINGS: * Follow these dosing instructions unless your doctor (or NP/PA) has told you to take a different dose. * Acetaminophen is thought to be safer than ibuprofen or naproxen in people over 51 years old. Acetaminophen is in many OTC and prescription medicines. It might be in more than one medicine that you are taking. You need to be careful and not take an overdose. An acetaminophen overdose can hurt the liver. [employer], the company that makes Tylenol, has different maximum dosage instructions for Tylenol in [place] than in the [place]. [employer] that makes Aleve, has different dosage maximum instructions for Aleve in [place] and the [place]. * CAUTION: Do not take acetaminophen if you have liver disease. * CAUTION: Do not take ibuprofen or naproxen if you have stomach problems, kidney disease, are pregnant, or have been told by your doctor to avoid this type  of anti-inflammatory drug. Do not take ibuprofen or naproxen for more than 7 days without consulting your doctor.  If you take blood thinners, ibuprofen and naproxen can increase the risk of bleeding. * Before taking any medicine, read all the instructions on the package. 4: CALL BACK IF: * Moderate pain (e.g., interferes with normal activities) lasts over 3 days * Mild pain lasts over 7 days * Arm swelling occurs * Signs of infection occur (e.g., spreading redness, warmth, fever) * You become worse

## 2022-04-23 NOTE — Telephone Encounter (Signed)
Pt c/o of Chest Pain: STAT if CP now or developed within 24 hours  1. Are you having CP right now? Left arm pain now  2. Are you experiencing any other symptoms (ex. SOB, nausea, vomiting, sweating)? Nausea   3. How long have you been experiencing CP? For an hour   4. Is your CP continuous or coming and going? Continuous   5. Have you taken Nitroglycerin? No  ?

## 2022-05-05 ENCOUNTER — Other Ambulatory Visit: Payer: Self-pay | Admitting: Internal Medicine

## 2022-05-07 ENCOUNTER — Other Ambulatory Visit: Payer: Self-pay | Admitting: Internal Medicine

## 2022-05-08 NOTE — Telephone Encounter (Signed)
Please advise if ok to refill Zetia 10 mg qd. Pt overdue for f/u. Pt hx of scheduling and cancelling appointments. Pt scheduled for future appointments.

## 2022-05-09 ENCOUNTER — Other Ambulatory Visit: Payer: Self-pay | Admitting: Internal Medicine

## 2022-05-12 NOTE — Progress Notes (Deleted)
Cardiology Office Note    Date:  05/12/2022   ID:  Kimberly Kemp, DOB March 04, 1965, MRN [id number]  PCP:  No primary care provider on file.  Cardiologist:  Yvonne Kendall, MD  Electrophysiologist:  None   Chief Complaint: ***  History of Present Illness:   Kimberly Kemp is a 56 y.o. female with history of CAD with PCI to the RCA in the setting of iatrogenic dissection of the RCA in 02/2019, ICM, HTN, familial hyperlipidemia, CVA, left subclavian artery stenosis, questionable PAF, questionable PE, COPD, carotid artery stenosis, colon cancer, hepatitis B, anxiety, and tobacco use who presents for ***.  She was evaluated as a new patient in 09/2018 for chest pain and dyspnea.  Subsequent ETT from 09/2018 was low to intermediate risk with decreased exercise capacity and motion artifact noted.  Given artifact noted, she underwent Dobutamine stress echo from 11/2018 was without evidence of significant ischemia with preserved LV systolic function.  Outpatient cardiac monitoring from 01/2019 showed a predominant rhythm of sinus with rare PACs and PVCs as well as rare episodes of brief PSVT lasting up to 7 beats.  Patient triggered events corresponded to sinus rhythm.  With continued symptoms of dyspnea and intermittent chest tightness and palpitations, she underwent R/LHC in 02/2019 that showed single-vessel CAD with 60 to 70% mid RCA stenosis.  Attempted iFR of this lesion led to wire dissection extending from the ostium through the distal RCA and occlusion of the PDA.  The RCA dissection was treated with overlapping resolute Onyx stents x 2.  There was no significant disease involving the left coronary tree.  Normal left heart filling pressures with mildly elevated right heart filling pressures as well as normal pulmonary artery pressure and supranormal cardiac output/index.  Echo at that time showed an EF of 55 to 60%, akinesis of the LV inferoseptal wall, normal RV systolic function and ventricular cavity size,  normal PASP, and no significant valvular abnormalities.  Most recent ischemic evaluation in 11/2019 vita Lexiscan MPI showed a medium size fixed perfusion defect involving the apex felt to likely represent artifact as there was significant diaphragmatic attenuation.  LVSF 55-65%.  Overall, this was a low risk study.  Carotid artery ultrasound from 03/2021 showed 1-39% bilateral ICA stenosis with antegrade flow of the bilateral vertebral arteries with disturbed flow in the left subclavian artery and normal flow in the right side.  She has had noted recurrent episodes of dyspnea and chest tightness intermittently in follow-up visits.  She was last seen in the office in 04/2021 noting paroxysms of palpitations and dyspnea.  In this setting, Lopressor was titrated to 50 mg twice daily with continuation of diltiazem.  There was no noted discrepancy between left and right upper extremity blood pressures with regards to her left subclavian artery stenosis.  She has contacted our office several times since noting intermittent palpitations and chest discomfort with multiple appointments having either been canceled or no showed.   ***   Labs independently reviewed: 05/2021 - potassium 3.9, BUN 18, serum creatinine 1.02, Hgb 14.1, PLT 236 12/2020 - TC 169, TG 62, HDL 63, LDL 94, albumin 4.4, AST/ALT normal  Past Medical History:  Diagnosis Date   ? h/o Paroxysmal atrial fibrillation (HCC)    ? h/o Pulmonary embolism (HCC)    Anxiety    Asthma    Atypical chest pain    a. 09/2018 ETT: poor exercise tolerance; b. 11/2018 Dobutamine Echo: No wall motion abnormalities, EF 55-60%.   CAD (  coronary artery disease)    a. 02/2019 Cath/PCI: LM nl, LAD nl, LCX nl, RCA 76m (attempted iFR->wire dissection-->3.0x38 Resolute Onyx DES prox, 2.75x38 Resolute Onyx DES mid), RPDA jailed by stent. EF 65%; b. 11/2019 MV: EF 55-65%. medium-sized apical defect - likely diaph atten. No ischemia-->low risk.   Carotid arterial disease  (HCC)    a. 03/2021 U/S: RICA 1-39%, LICA 1-39%. Bilat ECA <50%.   Chronic pain    Cirrhosis (HCC)    Colon cancer (HCC)    COPD (chronic obstructive pulmonary disease) (HCC)    COVID-19 virus infection 11/2019   Depression    Emphysema    GERD (gastroesophageal reflux disease)    Heart murmur    Hepatitis B    History of echocardiogram    a. 02/2019 Echo: EF 55-60%. No rwma. No significant valvular disease.   Hyperlipidemia    Hypertension    Palpitations    a. 02/2019 Zio: Sinus, avg 83 (55-135). Rare PACs/PVCs. Four atrial runs, longest 7 beats, fastest 146 bpm. Triggered event->sinus rhythm & artifact.   Stroke [facility])    Tobacco abuse     Past Surgical History:  Procedure Laterality Date   APPENDECTOMY     microscopic    BRONCHOSCOPY  March 2008, Feb 2012   CARDIAC CATHETERIZATION     CESAREAN SECTION     CORONARY PRESSURE/FFR STUDY N/A 02/22/2019   Procedure: INTRAVASCULAR PRESSURE WIRE/FFR STUDY;  Surgeon: Yvonne Kendall, MD;  Location: [facility] INVASIVE CV LAB;  Service: Cardiovascular;  Laterality: N/A;   CORONARY STENT INTERVENTION N/A 02/22/2019   Procedure: CORONARY STENT INTERVENTION;  Surgeon: Yvonne Kendall, MD;  Location: [facility] INVASIVE CV LAB;  Service: Cardiovascular;  Laterality: N/A;   RIGHT/LEFT HEART CATH AND CORONARY ANGIOGRAPHY N/A 02/22/2019   Procedure: RIGHT/LEFT HEART CATH AND CORONARY ANGIOGRAPHY;  Surgeon: Yvonne Kendall, MD;  Location: [facility] INVASIVE CV LAB;  Service: Cardiovascular;  Laterality: N/A;    Current Medications: No outpatient medications have been marked as taking for the 05/13/22 encounter (Appointment) with Sondra Barges, PA-C.    Allergies:   Penicillins, Omnipaque [iohexol], and Red dye   Social History   Socioeconomic History   Marital status: Married    Spouse name: Not on file   Number of children: 4   Years of education: Not on file   Highest education level: Not on file  Occupational History    Employer: UNEMPLOYED     Comment: on disability  Tobacco Use   Smoking status: Every Day    Packs/day: 0.50    Years: 43.00    Additional pack years: 0.00    Total pack years: 21.50    Types: Cigarettes   Smokeless tobacco: Never   Tobacco comments:    5 per day-06/06/2021  Vaping Use   Vaping Use: Never used  Substance and Sexual Activity   Alcohol use: No    Alcohol/week: 0.0 standard drinks of alcohol   Drug use: No   Sexual activity: Not on file  Other Topics Concern   Not on file  Social History Narrative   Lives in [place] with husband and daughter, on disability. Grandchildren visit daily.   Social Determinants of Health   Financial Resource Strain: Not on file  Food Insecurity: Not on file  Transportation Needs: Not on file  Physical Activity: Not on file  Stress: Not on file  Social Connections: Not on file     Family History:  The patient's family history includes Diabetes in her mother;  Emphysema in her father and maternal grandmother; Heart disease (age of onset: 5550) in her mother; Liver disease in her father; Prostate cancer in her father. There is no history of Colon cancer.  ROS:   12-point review of systems is negative unless otherwise noted in the HPI.   EKGs/Labs/Other Studies Reviewed:    Studies reviewed were summarized above. The additional studies were reviewed today:  Carotid artery ultrasound 03/21/2021: Summary:  Right Carotid: Velocities in the right ICA are consistent with a 1-39%  stenosis. Non-hemodynamically significant plaque <50% noted in the  CCA. The ECA appears <50% stenosed.   Left Carotid: Velocities in the left ICA are consistent with a 1-39%  stenosis. Non-hemodynamically significant plaque <50% noted in the  CCA. The ECA appears <50% stenosed.   Vertebrals:  Bilateral vertebral arteries demonstrate antegrade flow.  Subclavians: Left subclavian artery flow was disturbed. Normal flow  hemodynamics were seen in the right subclavian artery.   __________  Eugenie BirksLexiscan MPI 11/16/2019: There was no ST segment deviation noted during stress. There is a medium size fixed perfusion defect involving the apex. This likely represents artifact as there is significant diaphragmatic attenuations This is a low risk study. The left ventricular ejection fraction is normal (55-65%). There was no evidence for ischemia __________  2D echo 02/23/2019: 1. Left ventricular ejection fraction, by visual estimation, is 55 to  60%. The left ventricle has normal function. There is no left ventricular  hypertrophy.   2. The left ventricle demonstrates regional wall motion abnormalities.   3. Akinesis noted in the LV inferoseptal wall.   4. Global right ventricle has normal systolic function.The right  ventricular size is normal. No increase in right ventricular wall  thickness.   5. Left atrial size was normal.   6. Right atrial size was normal.   7. The mitral valve is normal in structure. No evidence of mitral valve  regurgitation.   8. The tricuspid valve is normal in structure.   9. The tricuspid valve is normal in structure. Tricuspid valve  regurgitation is not demonstrated.  10. The aortic valve is normal in structure. Aortic valve regurgitation is  not visualized.  11. The pulmonic valve was not well visualized. Pulmonic valve  regurgitation is not visualized.  12. Normal pulmonary artery systolic pressure.  __________  [facility] Conclusions: Single vessel coronary artery disease with 60-70% mid RCA stenosis.  Attempted iFR of this lesion led to wire dissection extending from the ostial through the distal RCA and occlusion of PDA.  RCA dissection was treated with overlapping Resolute Onyx 2.75 x 38 mm and 3.0 x 38 mm drug-eluting stents (post-dilated to 3.1 mm). No significant disease involving the left coronary artery. Normal left heart filling pressures. Mildly elevated right heart filling pressures. Normal pulmonary artery  pressure with mild transpulmonic gradient. Supranormal Fick cardiac output/index.   Recommendations: Admit to ICU for post-PCI management of chest pain due to occluded rPDA.  Titrate NTG infusion as blood pressure tolerates for relief of chest pain. Dual antiplatelet therapy with aspirin and ticagrelor for at least 12 months, ideally longer given long stented segment of the RCA. Aggressive secondary prevention, including high-intensity statin therapy and follow-up with the lipid clinic after discharge. __________  Luci BankZio patch 01/2019: The patient was monitored for 13 days, 20 hours. The predominant rhythm was sinus with an average rate of 83 bpm (range 55-135 bpm in sinus). There were rare PAC's and PVC's. Four atrial runs lasting up to 7 beats were observed  with a maximum rate of 146 bpm. No sustained arrhythmia or prolonged pause was noted. Patient triggered events correspond to sinus rhythm and artifact.   Predominantly sinus rhythm with rare PAC's and PVC's, as well as rare episodes of brief PSVT.  Patient triggered events correspond to sinus rhythm. __________  Dobutamine stress echo 11/16/2018: 1. Left ventricular ejection fraction, by visual estimation, is 55 to  60%. The left ventricle has normal function. There is no left ventricular  hypertrophy.   2. Global right ventricle has normal systolic function.The right  ventricular size is normal. No increase in right ventricular wall  thickness.   3. Left atrial size was normal.   4. Right atrial size was normal.   5. The mitral valve was not assessed. No evidence of mitral valve  regurgitation.   6. The tricuspid valve is not assessed. Tricuspid valve regurgitation was  not visualized by color flow Doppler.   7. The aortic valve was not assessed Aortic valve regurgitation was not  visualized by color flow Doppler.   8. The pulmonic valve was normal in structure. Pulmonic valve  regurgitation is not visualized by color flow  Doppler.   9. Aortic root could not be assessed.  10. Normal pulmonary artery systolic pressure.  11. Low risk, negative dobutamine stress test.  12. Negative stress echo for ischemia.  13. Post-stress: left ventricular systolic function was >65%.  14. Pre-stress: left ventricular systolic function was 55-60%.  15. The interatrial septum was not assessed.  16. The Stress test was discontinued at stage 3 due to target heart rate  achieved.  __________  ETT 09/20/2018: Baseline EKG demonstrates normal sinus rhythm. The patient demonstrates decreased exercise capacity with hypertensive blood pressure response. No chest pain was reported during stress. No significant ST segment or T wave changes were seen during stress or recovery, though stress tracings are severely degraded by motion artifact. Low to intermediate risk exercise tolerance test (Duke treadmill score 4-5).   Low to intermediate risk exercise tolerance test with decreased exercise capacity and significant motion artifact.    EKG:  EKG is ordered today.  The EKG ordered today demonstrates ***  Recent Labs: 05/26/2021: BUN 18; Creatinine, Ser 1.02; Hemoglobin 14.1; Platelets 236; Potassium 3.9; Sodium 141  Recent Lipid Panel    Component Value Date/Time   CHOL 170 11/08/2020 0854   TRIG 192 (H) 11/08/2020 0854   HDL 67 11/08/2020 0854   CHOLHDL 2.5 11/08/2020 0854   VLDL 38 11/08/2020 0854   LDLCALC 65 11/08/2020 0854    PHYSICAL EXAM:    VS:  LMP 07/18/2016   BMI: There is no height or weight on file to calculate BMI.  Physical Exam  Wt Readings from Last 3 Encounters:  08/03/21 118 lb (53.5 kg)  06/30/21 120 lb (54.4 kg)  06/15/21 121 lb 4.1 oz (55 kg)     ASSESSMENT & PLAN:   CAD involving the native coronary arteries with ***:  Palpitations/PSVT:  Left subclavian artery stenosis:  Carotid artery stenosis:  HTN: Blood pressure   {Are you ordering a CV Procedure (e.g. stress test, cath, DCCV, TEE,  etc)?   Press F2        :[id number]}     Disposition: F/u with Dr. Okey Dupre or an APP in ***.   Medication Adjustments/Labs and Tests Ordered: Current medicines are reviewed at length with the patient today.  Concerns regarding medicines are outlined above. Medication changes, Labs and Tests ordered today are summarized above and listed  in the Patient Instructions accessible in Encounters.   Signed, Eula Listen, PA-C 05/12/2022 10:35 AM     [facility] [place] [address] [place], [place] [address] [phone]

## 2022-05-13 ENCOUNTER — Ambulatory Visit: Payer: 59 | Attending: Physician Assistant | Admitting: Physician Assistant

## 2022-05-13 ENCOUNTER — Encounter: Payer: Self-pay | Admitting: Physician Assistant

## 2022-05-23 ENCOUNTER — Ambulatory Visit: Payer: 59 | Admitting: Internal Medicine

## 2022-05-23 ENCOUNTER — Encounter: Payer: Self-pay | Admitting: Pulmonary Disease

## 2022-05-23 ENCOUNTER — Encounter: Payer: Self-pay | Admitting: Nurse Practitioner

## 2022-05-23 ENCOUNTER — Ambulatory Visit (INDEPENDENT_AMBULATORY_CARE_PROVIDER_SITE_OTHER): Payer: 59

## 2022-05-23 ENCOUNTER — Ambulatory Visit: Payer: 59 | Attending: Nurse Practitioner | Admitting: Nurse Practitioner

## 2022-05-23 ENCOUNTER — Other Ambulatory Visit
Admission: RE | Admit: 2022-05-23 | Discharge: 2022-05-23 | Disposition: A | Discharge status: Home / Self Care | Payer: 59 | Source: Ambulatory Visit | Attending: Nurse Practitioner | Admitting: Nurse Practitioner

## 2022-05-23 ENCOUNTER — Other Ambulatory Visit: Payer: Self-pay | Admitting: Internal Medicine

## 2022-05-23 VITALS — BP 150/72 | HR 86 | Ht 63.0 in | Wt 127.0 lb

## 2022-05-23 DIAGNOSIS — R072 Precordial pain: Secondary | ICD-10-CM | POA: Diagnosis not present

## 2022-05-23 DIAGNOSIS — R002 Palpitations: Secondary | ICD-10-CM

## 2022-05-23 DIAGNOSIS — R739 Hyperglycemia, unspecified: Secondary | ICD-10-CM | POA: Diagnosis not present

## 2022-05-23 DIAGNOSIS — I471 Supraventricular tachycardia, unspecified: Secondary | ICD-10-CM | POA: Diagnosis not present

## 2022-05-23 DIAGNOSIS — I25119 Atherosclerotic heart disease of native coronary artery with unspecified angina pectoris: Secondary | ICD-10-CM

## 2022-05-23 DIAGNOSIS — Z720 Tobacco use: Secondary | ICD-10-CM | POA: Diagnosis not present

## 2022-05-23 DIAGNOSIS — I25118 Atherosclerotic heart disease of native coronary artery with other forms of angina pectoris: Secondary | ICD-10-CM | POA: Insufficient documentation

## 2022-05-23 DIAGNOSIS — E7801 Familial hypercholesterolemia: Secondary | ICD-10-CM | POA: Insufficient documentation

## 2022-05-23 DIAGNOSIS — I10 Essential (primary) hypertension: Secondary | ICD-10-CM | POA: Diagnosis not present

## 2022-05-23 DIAGNOSIS — R079 Chest pain, unspecified: Secondary | ICD-10-CM | POA: Diagnosis not present

## 2022-05-23 DIAGNOSIS — E785 Hyperlipidemia, unspecified: Secondary | ICD-10-CM

## 2022-05-23 LAB — COMPREHENSIVE METABOLIC PANEL
ALT: 21 U/L (ref 0–44)
AST: 31 U/L (ref 15–41)
Albumin: 4.4 g/dL (ref 3.5–5.0)
Alkaline Phosphatase: 111 U/L (ref 38–126)
Anion gap: 11 (ref 5–15)
BUN: 20 mg/dL (ref 6–20)
CO2: 23 mmol/L (ref 22–32)
Calcium: 9.8 mg/dL (ref 8.9–10.3)
Chloride: 107 mmol/L (ref 98–111)
Creatinine, Ser: 1.01 mg/dL — ABNORMAL HIGH (ref 0.44–1.00)
GFR, Estimated: >60 mL/min (ref >60)
Glucose, Bld: 100 mg/dL — ABNORMAL HIGH (ref 70–99)
Potassium: 4 mmol/L (ref 3.5–5.1)
Sodium: 141 mmol/L (ref 135–145)
Total Bilirubin: 0.6 mg/dL (ref 0.3–1.2)
Total Protein: 7.9 g/dL (ref 6.5–8.1)

## 2022-05-23 LAB — LIPID PANEL
Cholesterol: 182 mg/dL (ref 0–200)
HDL: 82 mg/dL (ref >40)
LDL Cholesterol: 82 mg/dL (ref 0–99)
Total CHOL/HDL Ratio: 2.2 RATIO
Triglycerides: 89 mg/dL (ref <150)
VLDL: 18 mg/dL (ref 0–40)

## 2022-05-23 LAB — CBC
HCT: 42.6 % (ref 36.0–46.0)
Hemoglobin: 14.3 g/dL (ref 12.0–15.0)
MCH: 31.3 pg (ref 26.0–34.0)
MCHC: 33.6 g/dL (ref 30.0–36.0)
MCV: 93.2 fL (ref 80.0–100.0)
Platelets: 239 10*3/uL (ref 150–400)
RBC: 4.57 MIL/uL (ref 3.87–5.11)
RDW: 13.5 % (ref 11.5–15.5)
WBC: 9.7 10*3/uL (ref 4.0–10.5)
nRBC: 0.2 % (ref 0.0–0.2)

## 2022-05-23 LAB — TROPONIN I (HIGH SENSITIVITY): Troponin I (High Sensitivity): 6 ng/L (ref <18)

## 2022-05-23 MED ORDER — METOPROLOL TARTRATE 50 MG PO TABS: 50.0000 mg | ORAL_TABLET | Freq: Two times a day (BID) | ORAL | 3 refills | Status: DC

## 2022-05-23 MED ORDER — NITROGLYCERIN 0.4 MG SL SUBL: 0.4000 mg | SUBLINGUAL_TABLET | SUBLINGUAL | 3 refills | Status: DC | PRN

## 2022-05-23 MED ORDER — EZETIMIBE 10 MG PO TABS: 10.0000 mg | ORAL_TABLET | Freq: Every day | ORAL | 3 refills | Status: DC

## 2022-05-23 MED ORDER — DILTIAZEM HCL ER COATED BEADS 240 MG PO CP24: 240.0000 mg | ORAL_CAPSULE | Freq: Every day | ORAL | 3 refills | Status: DC

## 2022-05-23 NOTE — Progress Notes (Deleted)
Follow-up Outpatient Visit Date: 05/23/2022  Primary Care Provider: No primary care provider on file. No primary provider on file.  Chief Complaint: ***  HPI:  Kimberly Kemp is a 57 y.o. female with history of coronary artery disease status post PCI to the RCA in the setting of iatrogenic dissection of the RCA (02/22/2019), left subclavian artery stenosis, ischemic cardiomyopathy, hypertension, familial hyperlipidemia, stroke, questionable paroxysmal atrial fibrillation, questionable pulmonary embolism, COPD, colon cancer, hepatitis B, anxiety, and tobacco abuse, who presents for follow-up of CAD, PAD, and cardiomyopathy.  I last saw her in 04/2021, as she has been a no-show for multiple subsequent appointments.  At our last visit, she complained of palpitations but no significant angina.  She did not have any left arm claudication with no significant blood pressure discrepancy noted at our last visit.  We agreed to increase metoprolol to tartrate to 50 mg twice daily.  --------------------------------------------------------------------------------------------------  Cardiovascular History & Procedures: Cardiovascular Problems: Coronary artery disease s/p PCI to RCA (02/2019) Paroxysmal atrial fibrillation (questionable; no objective documentation in the patient's chart) Pulmonary embolism (questionable; no objective documentation in the patient's chart) Shortness of breath   Risk Factors: Prior stroke, hypertension, hyperlipidemia, and tobacco use   Cath/PCI: RHC/LHC and PCI (02/22/2019): LMCA normal.  LAD normal.  LCx normal.  Dominant RCA with 60 to 70% mid vessel stenosis.  RA 9, RV 33/9, PA 26/13, PCWP 13, Fick CO/CI 7.8/4.9.  LVEDP 15.  Attempted IFR of mid RCA complicated by guide wire dissection necessitating stenting of the ostial through distal RCA with overlapping Resolute Onyx stents (2.75 x 38 and 3.0 x 38 mm).  RCA was jailed with loss of flow.   CV Surgery: None   EP  Procedures and Devices: 14-day event monitor (01/05/2019): Predominantly sinus rhythm with rare PAC's and PVC's, as well as rare episodes of brief PSVT.  Patient triggered events correspond to sinus rhythm.   Non-Invasive Evaluation(s): Carotid Doppler (03/21/2021): 1-39% stenoses in bilateral ICA's.  <50% stenosis in both CCA's and ECA's.  Turbulent flow noted in the left subclavian artery with antegrade flow in both vertebral arteries. Pharmacologic MPI (11/16/2019): Low risk study with fixed defect at the apex.  On personal review, the fixed defect involves the inferior wall as well.  No evidence of ischemia.  LVEF 55-65%. Carotid Doppler (10/24/2019): Right ICA with 40-59% stenosis, left ICA with 1-39% stenosis.  Antegrade flow in both vertebral arteries.  Normal subclavian artery flow dynamics. TTE (02/23/2019): Normal LV size and wall thickness.  LVEF 55-60% with inferoseptal akinesis.  Normal RV size and function.  No significant valvular abnormality.  Normal PA pressure. Dobutamine stress echocardiogram (11/16/2018): Low risk study with normal baseline LVEF and no evidence of inducible wall motion abnormality.  LVEF 55-60%. Exercise tolerance test (09/20/2018): Low to intermediate risk study with decreased exercise capacity and significant motion artifact.  Recent CV Pertinent Labs: Lab Results  Component Value Date   CHOL 170 11/08/2020   HDL 67 11/08/2020   LDLCALC 65 11/08/2020   TRIG 192 (H) 11/08/2020   CHOLHDL 2.5 11/08/2020   K 3.9 05/26/2021   BUN 18 05/26/2021   CREATININE 1.02 (H) 05/26/2021    Past medical and surgical history were reviewed and updated in EPIC.  No outpatient medications have been marked as taking for the 05/23/22 encounter (Appointment) with Raiza Kiesel, Cristal Deer, MD.    Allergies: Penicillins, Omnipaque [iohexol], and Red dye  Social History   Tobacco Use   Smoking status: Every Day  Packs/day: 0.50    Years: 43.00    Additional pack years: 0.00     Total pack years: 21.50    Types: Cigarettes   Smokeless tobacco: Never   Tobacco comments:    5 per day-06/06/2021  Vaping Use   Vaping Use: Never used  Substance Use Topics   Alcohol use: No    Alcohol/week: 0.0 standard drinks of alcohol   Drug use: No    Family History  Problem Relation Age of Onset   Emphysema Father    Prostate cancer Father    Liver disease Father    Emphysema Maternal Grandmother    Diabetes Mother    Heart disease Mother 19       CABG and valve replacement   Colon cancer Neg Hx     Review of Systems: A 12-system review of systems was performed and was negative except as noted in the HPI.  --------------------------------------------------------------------------------------------------  Physical Exam: LMP 07/18/2016   General:  NAD. Neck: No JVD or HJR. Lungs: Clear to auscultation bilaterally without wheezes or crackles. Heart: Regular rate and rhythm without murmurs, rubs, or gallops. Abdomen: Soft, nontender, nondistended. Extremities: No lower extremity edema.  EKG:  ***  Lab Results  Component Value Date   WBC 8.7 05/26/2021   HGB 14.1 05/26/2021   HCT 42.8 05/26/2021   MCV 92.0 05/26/2021   PLT 236 05/26/2021    Lab Results  Component Value Date   NA 141 05/26/2021   K 3.9 05/26/2021   CL 113 (H) 05/26/2021   CO2 23 05/26/2021   BUN 18 05/26/2021   CREATININE 1.02 (H) 05/26/2021   GLUCOSE 104 (H) 05/26/2021   ALT 30 11/08/2020    Lab Results  Component Value Date   CHOL 170 11/08/2020   HDL 67 11/08/2020   LDLCALC 65 11/08/2020   TRIG 192 (H) 11/08/2020   CHOLHDL 2.5 11/08/2020    --------------------------------------------------------------------------------------------------  ASSESSMENT AND PLAN: Kimberly Kendall, MD 05/23/2022 7:43 AM

## 2022-05-23 NOTE — Patient Instructions (Addendum)
Medication Instructions:  Your physician has recommended you make the following change in your medication:   INCREASE Diltiazem to 240 mg once daily.   *If you need a refill on your cardiac medications before your next appointment, please call your pharmacy*   Lab Work: CBC, CMET, Lipid, Direct LDL, A1c, Troponin. Go to [facility] and check in at registration desk.   If you have labs (blood work) drawn today and your tests are completely normal, you will receive your results only by: MyChart Message (if you have MyChart) OR A paper copy in the mail If you have any lab test that is abnormal or we need to change your treatment, we will call you to review the results.   Testing/Procedures: Your physician has recommended that you wear a Zio monitor.   This monitor is a medical device that records the heart's electrical activity. Doctors most often use these monitors to diagnose arrhythmias. Arrhythmias are problems with the speed or rhythm of the heartbeat. The monitor is a small device applied to your chest. You can wear one while you do your normal daily activities. While wearing this monitor if you have any symptoms to push the button and record what you felt. Once you have worn this monitor for the period of time provider prescribed (Usually 14 days), you will return the monitor device in the postage paid box. Once it is returned they will download the data collected and provide [place] with a report which the provider will then review and we will call you with those results. Important tips:  Avoid showering during the first 24 hours of wearing the monitor. Avoid excessive sweating to help maximize wear time. Do not submerge the device, no hot tubs, and no swimming pools. Keep any lotions or oils away from the patch. After 24 hours you may shower with the patch on. Take brief showers with your back facing the shower head.  Do not remove patch once it has been placed because that will  interrupt data and decrease adhesive wear time. Push the button when you have any symptoms and write down what you were feeling. Once you have completed wearing your monitor, remove and place into box which has postage paid and place in your outgoing mailbox.  If for some reason you have misplaced your box then call our office and we can provide another box and/or mail it off for you.      Follow-Up: At [facility] you and your health needs are our priority.  As part of our continuing mission to provide you with exceptional heart care, we have created designated Provider Care Teams.  These Care Teams include your primary Cardiologist (physician) and Advanced Practice Providers (APPs -  Physician Assistants and Nurse Practitioners) who all work together to provide you with the care you need, when you need it.   Your next appointment:   1 month(s)  Provider:   Nicolasa Ducking, NP

## 2022-05-23 NOTE — Progress Notes (Addendum)
Office Visit    Patient Name: Kimberly Kemp Date of Encounter: 05/23/2022  Primary Care Provider:  Alease Medina, MD Primary Cardiologist:  Yvonne Kendall, MD  Chief Complaint    57 year old female with history of CAD status post PCI to the RCA in January 2021 complicated by iatrogenic dissection of RCA, ischemic cardiomyopathy, PSVT, hypertension, familial hyperlipidemia, stroke, questionable history of paroxysmal atrial fibrillation and pulmonary embolism, COPD, colon cancer, hepatitis B, anxiety, subclavian artery stenosis, carotid disease, and tobacco abuse, who presents for follow-up of CAD.  Past Medical History    Past Medical History:  Diagnosis Date   ? h/o Paroxysmal atrial fibrillation [facility])    ? h/o Pulmonary embolism (HCC)    Anxiety    Asthma    Atypical chest pain    a. 09/2018 ETT: poor exercise tolerance; b. 11/2018 Dobutamine Echo: No wall motion abnormalities, EF 55-60%.   CAD (coronary artery disease)    a. 02/2019 Cath/PCI: LM nl, LAD nl, LCX nl, RCA 65m (attempted iFR->wire dissection-->3.0x38 Resolute Onyx DES prox, 2.75x38 Resolute Onyx DES mid), RPDA jailed by stent. EF 65%; b. 11/2019 MV: EF 55-65%. medium-sized apical defect - likely diaph atten. No ischemia-->low risk.   Carotid arterial disease    a. 03/2021 U/S: RICA 1-39%, LICA 1-39%. Bilat ECA <50%.   Chronic pain    Cirrhosis    Colon cancer    COPD (chronic obstructive pulmonary disease)    COVID-19 virus infection 11/2019   Depression    Emphysema    GERD (gastroesophageal reflux disease)    Heart murmur    Hepatitis B    History of echocardiogram    a. 02/2019 Echo: EF 55-60%. No rwma. No significant valvular disease.   Hyperlipidemia    Hypertension    PSVT (paroxysmal supraventricular tachycardia)    a. 02/2019 Zio: Sinus, avg 83 (55-135). Rare PACs/PVCs. Four atrial runs, longest 7 beats, fastest 146 bpm. Triggered event->sinus rhythm & artifact.   Stroke    Tobacco abuse    Past  Surgical History:  Procedure Laterality Date   APPENDECTOMY     microscopic    BRONCHOSCOPY  March 2008, Feb 2012   CARDIAC CATHETERIZATION     CESAREAN SECTION     CORONARY PRESSURE/FFR STUDY N/A 02/22/2019   Procedure: INTRAVASCULAR PRESSURE WIRE/FFR STUDY;  Surgeon: Yvonne Kendall, MD;  Location: [facility] INVASIVE CV LAB;  Service: Cardiovascular;  Laterality: N/A;   CORONARY STENT INTERVENTION N/A 02/22/2019   Procedure: CORONARY STENT INTERVENTION;  Surgeon: Yvonne Kendall, MD;  Location: [facility] INVASIVE CV LAB;  Service: Cardiovascular;  Laterality: N/A;   RIGHT/LEFT HEART CATH AND CORONARY ANGIOGRAPHY N/A 02/22/2019   Procedure: RIGHT/LEFT HEART CATH AND CORONARY ANGIOGRAPHY;  Surgeon: Yvonne Kendall, MD;  Location: [facility] INVASIVE CV LAB;  Service: Cardiovascular;  Laterality: N/A;    Allergies  Allergies  Allergen Reactions   Penicillins Hives    Did it involve swelling of the face/tongue/throat, SOB, or low BP? No Did it involve sudden or severe rash/hives, skin peeling, or any reaction on the inside of your mouth or nose? Yes Did you need to seek medical attention at a hospital or doctor's office? Yes When did it last happen?      10 Years If all above answers are "NO", may proceed with cephalosporin use.    Omnipaque [Iohexol] Itching        Red Dye Itching    History of Present Illness    57 year old female with the  above past medical history including CAD, ischemic cardiomyopathy, hypertension, familial hyperlipidemia, PSVT, stroke, questionable history of A-fib and PE, COPD, colon cancer, hepatitis B, anxiety, subclavian artery stenosis, carotid disease, and ongoing tobacco abuse.  In the setting of ongoing chest pain despite normal ETT in August 2020, and normal dobutamine stress echo in October 2020, she underwent diagnostic catheterization in February 10, 2019, which was notable for moderate proximal RCA disease.  iFR was attempted and the RCA resulted in dissection ST  segment elevation.  She then underwent drug-eluting stent placement to the proximal and mid RCA.  The RPDA was jailed by the stent.  Echo showed normal LV function with only a small region of akinesis.  Stress testing in late 2021 in the setting of ongoing chest pain and pressure was low risk with an EF of 55 to 65% with a medium size apical defect, felt to be secondary to diaphragmatic attenuation.  [name] was last seen in cardiology clinic in March 2023, at which time she reported an episode of palpitations occurring a week prior.  Previous monitoring 2020 showed a few brief episodes of PSVT.  Metoprolol dose was increased to 50 mg twice daily and she was maintained on diltiazem.  [name] has contacted our office several times recently in the setting of chest pain, and after refusing to present to the ED, she was added onto my schedule today.  She notes that over the past 4 months, she has been having intermittent substernal chest pain associated with tachypalpitations, worse with deep breathing, and palpation.  Symptoms last several hours at a time and are often provoked by an argument with her husband.  She will usually take nitroglycerin several hours apart and notes that it typically resolves after the second nitroglycerin.  She had symptoms that lasted several hours earlier this morning, prior to this appointment.  ECG is currently normal.  She denies PND, orthopnea, dizziness, syncope, edema, or early satiety.  Home Medications    Current Outpatient Medications  Medication Sig Dispense Refill   acetaminophen-codeine (TYLENOL #3) 300-30 MG tablet Take 1-2 tablets by mouth every 4 (four) hours as needed for moderate pain. 30 tablet 0   albuterol (PROVENTIL) (2.5 MG/3ML) 0.083% nebulizer solution TAKE 3 MLS (2.5 MG TOTAL) BY NEBULIZATION 4 TIMES DAILY AS NEEDED FOR WHEEZING SHORTNESS OF BREATH 375 mL 3   albuterol (VENTOLIN HFA) 108 (90 Base) MCG/ACT inhaler INHALE 2 PUFFS INTO THE LUNGS EVERY 4  HOURS AS NEEDED FOR WHEEZE OR FOR SHORTNESS OF BREATH 8.5 each 2   aspirin EC 81 MG tablet Take 81 mg by mouth daily.     budesonide (PULMICORT) 0.25 MG/2ML nebulizer solution INHALE 1 VIAL VIA NEBULIZER TWICE A DAY 360 mL 3   butalbital-acetaminophen-caffeine (FIORICET, ESGIC) 50-325-40 MG tablet Take 1 tablet by mouth 3 (three) times daily as needed for headache.  2   cetirizine (ZYRTEC) 10 MG tablet Take 10 mg by mouth daily.     clonazePAM (KLONOPIN) 0.5 MG tablet Take 0.5 mg by mouth 2 (two) times daily.     diltiazem (CARDIZEM CD) 240 MG 24 hr capsule Take 1 capsule (240 mg total) by mouth daily. 90 capsule 3   famotidine (PEPCID) 40 MG tablet Take 40 mg by mouth at bedtime.     gabapentin (NEURONTIN) 300 MG capsule Take 300 mg by mouth at bedtime.  1   ipratropium (ATROVENT) 0.02 % nebulizer solution TAKE 2.5 ML (0.5 MG TOTAL) BY NEBULIZATION 4 TIMES A DAY  312.5 mL 3   magic mouthwash w/lidocaine SOLN Take 10 mLs by mouth 4 (four) times daily as needed for mouth pain. 120 mL 0   montelukast (SINGULAIR) 10 MG tablet Take 10 mg by mouth daily.     nystatin (MYCOSTATIN) 100000 UNIT/ML suspension Take 5 mLs (500,000 Units total) by mouth 4 (four) times daily. 60 mL 0   Omega-3 Fatty Acids (FISH OIL PO) Take 1 capsule by mouth 2 (two) times daily.     omeprazole (PRILOSEC) 40 MG capsule TAKE 1 CAPSULE BY MOUTH EVERY DAY 30 capsule 2   oxyCODONE-acetaminophen (PERCOCET) 5-325 MG tablet Take 1 tablet by mouth every 4 (four) hours as needed for severe pain. 30 tablet 0   PAZEO 0.7 % SOLN Place 1 drop into both eyes daily.     pramipexole (MIRAPEX) 1 MG tablet Take 1 mg by mouth at bedtime.     promethazine (PHENERGAN) 25 MG tablet Take 25 mg by mouth every 4 (four) hours as needed.     rosuvastatin (CRESTOR) 40 MG tablet TAKE 1 TABLET BY MOUTH EVERY DAY 90 tablet 3   ezetimibe (ZETIA) 10 MG tablet Take 1 tablet (10 mg total) by mouth daily. 90 tablet 3   metoprolol tartrate (LOPRESSOR) 50 MG  tablet Take 1 tablet (50 mg total) by mouth 2 (two) times daily. 180 tablet 3   nitroGLYCERIN (NITROSTAT) 0.4 MG SL tablet Place 1 tablet (0.4 mg total) under the tongue every 5 (five) minutes as needed for chest pain. Maximum of 3 doses. 25 tablet 3   No current facility-administered medications for this visit.     Review of Systems    Chest pain associated tachypalpitations and dyspnea occurring several times per week over the past 4 months, lasting several hours at a time, and resolving either spontaneously or after sublingual nitroglycerin.  She denies PND, orthopnea, dizziness, syncope, edema, or early satiety.  All other systems reviewed and are otherwise negative except as noted above.    Physical Exam    VS:  BP (!) 150/78 (BP Location: Left Arm, Patient Position: Sitting, Cuff Size: Normal)   Pulse 86   Ht 5\' 3"  (1.6 m)   Wt 127 lb (57.6 kg)   LMP 07/18/2016   SpO2 99%   BMI 22.50 kg/m  , BMI Body mass index is 22.5 kg/m.     Vitals:   05/23/22 1111 05/23/22 1612  BP: (!) 150/78 (!) 150/72  Pulse: 86   SpO2: 99%     GEN: Well nourished, well developed, in no acute distress. HEENT: normal. Neck: Supple, no JVD, carotid bruits, or masses. Cardiac: RRR, no murmurs, rubs, or gallops. No clubbing, cyanosis, edema.  Radials 2+/PT 2+ and equal bilaterally.  Respiratory:  Respirations regular and unlabored, clear to auscultation bilaterally. GI: Soft, nontender, nondistended, BS + x 4. MS: no deformity or atrophy. Skin: warm and dry, no rash. Neuro:  Strength and sensation are intact. Psych: Normal affect.  Accessory Clinical Findings    ECG personally reviewed by me today -regular sinus rhythm, 85, left atrial enlargement, prior inferior infarct- no acute changes.  Lab Results  Component Value Date   WBC 9.7 05/23/2022   HGB 14.3 05/23/2022   HCT 42.6 05/23/2022   MCV 93.2 05/23/2022   PLT 239 05/23/2022   Lab Results  Component Value Date   CREATININE 1.01 (H)  05/23/2022   BUN 20 05/23/2022   NA 141 05/23/2022   K 4.0 05/23/2022   CL 107  05/23/2022   CO2 23 05/23/2022   Lab Results  Component Value Date   ALT 21 05/23/2022   AST 31 05/23/2022   ALKPHOS 111 05/23/2022   BILITOT 0.6 05/23/2022   Lab Results  Component Value Date   CHOL 182 05/23/2022   HDL 82 05/23/2022   LDLCALC 82 05/23/2022   TRIG 89 05/23/2022   CHOLHDL 2.2 05/23/2022     Assessment & Plan    1.  Precordial chest pain/CAD: Patient with prior history of iatrogenic right coronary artery dissection requiring stenting.  She has had intermittent chest pain ever since with nonischemic stress testing October 2021.  Over the past 4 months, she has been having intermittent precordial chest pain associate with palpitations and dyspnea occurring in the setting of emotional upset/arguments with her husband, 3-4 times per week, lasting several hours at a time, and resolving either spontaneously or after 2 sublingual nitroglycerin tablets, often taking several hours apart.  Pain is worse with deep breathing and palpation.  She had several hours of chest pain this morning but is currently chest pain-free.  ECG shows no acute ST or T changes.  She does not want to present to the emergency department.  I did obtain an EKG stat troponin, which is normal.  Reassurance provided.  I will place a ZIO monitor in the setting of tachypalpitations described by Ms. Kruckenberg with prior history of PSVT.  I am also increasing her diltiazem to 240 mg daily in the setting of palpitations and hypertension.  2.  Tachypalpitations/PSVT: As outlined above, she been experiencing frequent episodes of tachypalpitations.  She does have a history of PSVT, with brief episodes noted on prior monitoring in 2020.  I am placing a ZIO XT to evaluate for worsening or more persistent arrhythmia.  I have also increased her diltiazem CD to 240 mg daily.  Continue metoprolol 50 mg twice daily.  3.  Essential hypertension: Blood  pressure elevated today on 2 recordings.  Increasing diltiazem to 240 mg daily.  Continue beta-blocker.  4.  Hyperlipidemia: Remains on rosuvastatin therapy with LDL of 82 based on labs today.  Direct LDL is pending, she was nonfasting.  5.  Tobacco abuse: Continues to smoke 5 cigarettes a day.  Complete cessation advised.  6.  Elevated fasting plasma glucose:  History of elevated blood sugars.  No recent A1c on file - f/u today.  7.  Disposition: Follow-up Zio monitoring.  Follow-up in clinic in 1 month or sooner if necessary.  Nicolasa Ducking, NP 05/23/2022, 4:06 PM

## 2022-05-25 LAB — LDL CHOLESTEROL, DIRECT: Direct LDL: 90 mg/dL (ref 0–99)

## 2022-05-25 LAB — HEMOGLOBIN A1C
Hgb A1c MFr Bld: 5.6 % (ref 4.8–5.6)
Mean Plasma Glucose: 114.02 mg/dL

## 2022-05-26 DIAGNOSIS — I471 Supraventricular tachycardia, unspecified: Secondary | ICD-10-CM

## 2022-05-26 DIAGNOSIS — R002 Palpitations: Secondary | ICD-10-CM | POA: Diagnosis not present

## 2022-05-26 NOTE — Addendum Note (Signed)
Addended by: Auburn Bilberry D on: 05/26/2022 09:56 AM   Modules accepted: Orders

## 2022-05-29 ENCOUNTER — Encounter: Payer: Self-pay | Admitting: *Deleted

## 2022-05-29 DIAGNOSIS — H5203 Hypermetropia, bilateral: Secondary | ICD-10-CM | POA: Diagnosis not present

## 2022-06-13 DIAGNOSIS — I471 Supraventricular tachycardia, unspecified: Secondary | ICD-10-CM | POA: Diagnosis not present

## 2022-06-13 DIAGNOSIS — R002 Palpitations: Secondary | ICD-10-CM | POA: Diagnosis not present

## 2022-06-13 DIAGNOSIS — J441 Chronic obstructive pulmonary disease with (acute) exacerbation: Secondary | ICD-10-CM | POA: Diagnosis not present

## 2022-06-16 ENCOUNTER — Telehealth: Payer: Self-pay | Admitting: Internal Medicine

## 2022-06-16 NOTE — Telephone Encounter (Signed)
Patient is calling to follow up on heart monitor and patient has questions about her MyChart. Please advise.

## 2022-06-16 NOTE — Telephone Encounter (Signed)
The patient has been notified of the result and verbalized understanding.  All questions (if any) were answered. Frutoso Schatz, RN 06/16/2022 4:25 PM  Patient has also been provided with MyChart support phone number.

## 2022-06-16 NOTE — Telephone Encounter (Signed)
-----   Message from Creig Hines, NP sent at 06/16/2022  2:21 PM EDT ----- Predominantly normal rhythm with rare extra beats from the top and bottom chambers and 2 brief episodes of fast heart beats from the top chambers.  Brief episode of slow heart beat. Triggered events were associated with normal rhythm as well as with extra beats from the top and bottom chambers.  Overall reassuring.  Cont current doses of metoprolol and diltiazem.

## 2022-06-17 ENCOUNTER — Other Ambulatory Visit: Payer: Self-pay | Admitting: Internal Medicine

## 2022-07-03 ENCOUNTER — Encounter: Payer: Self-pay | Admitting: Nurse Practitioner

## 2022-07-03 ENCOUNTER — Ambulatory Visit: Payer: 59 | Attending: Nurse Practitioner | Admitting: Nurse Practitioner

## 2022-07-03 VITALS — BP 122/62 | HR 72 | Ht 63.0 in | Wt 133.4 lb

## 2022-07-03 DIAGNOSIS — I25118 Atherosclerotic heart disease of native coronary artery with other forms of angina pectoris: Secondary | ICD-10-CM

## 2022-07-03 DIAGNOSIS — R072 Precordial pain: Secondary | ICD-10-CM | POA: Diagnosis not present

## 2022-07-03 DIAGNOSIS — I471 Supraventricular tachycardia, unspecified: Secondary | ICD-10-CM

## 2022-07-03 DIAGNOSIS — I10 Essential (primary) hypertension: Secondary | ICD-10-CM | POA: Diagnosis not present

## 2022-07-03 DIAGNOSIS — R739 Hyperglycemia, unspecified: Secondary | ICD-10-CM

## 2022-07-03 DIAGNOSIS — E785 Hyperlipidemia, unspecified: Secondary | ICD-10-CM

## 2022-07-03 DIAGNOSIS — Z720 Tobacco use: Secondary | ICD-10-CM | POA: Diagnosis not present

## 2022-07-03 NOTE — Patient Instructions (Signed)
Medication Instructions:  No changes *If you need a refill on your cardiac medications before your next appointment, please call your pharmacy*   Lab Work: None ordered If you have labs (blood work) drawn today and your tests are completely normal, you will receive your results only by: MyChart Message (if you have MyChart) OR A paper copy in the mail If you have any lab test that is abnormal or we need to change your treatment, we will call you to review the results.   Testing/Procedures: None ordered   Follow-Up: At [facility], you and your health needs are our priority.  As part of our continuing mission to provide you with exceptional heart care, we have created designated Provider Care Teams.  These Care Teams include your primary Cardiologist (physician) and Advanced Practice Providers (APPs -  Physician Assistants and Nurse Practitioners) who all work together to provide you with the care you need, when you need it.  We recommend signing up for the patient portal called "MyChart".  Sign up information is provided on this After Visit Summary.  MyChart is used to connect with patients for Virtual Visits (Telemedicine).  Patients are able to view lab/test results, encounter notes, upcoming appointments, etc.  Non-urgent messages can be sent to your provider as well.   To learn more about what you can do with MyChart, go to [email or web address].    Your next appointment:   6 month(s)  Provider:   You may see Christopher End, MD or one of the following Advanced Practice Providers on your designated Care Team:   Christopher Berge, NP Ryan Dunn, PA-C Cadence Furth, PA-C Sheri Hammock, NP     
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
## 2022-07-10 ENCOUNTER — Telehealth: Payer: Self-pay | Admitting: Pulmonary Disease

## 2022-07-10 DIAGNOSIS — J432 Centrilobular emphysema: Secondary | ICD-10-CM

## 2022-07-10 NOTE — Telephone Encounter (Signed)
Pt calling in bc she was told to call us to see if Dr. Craige Cotta will call the nebulizer place to switch her from the one he stopped her on to the proneb.

## 2022-07-15 NOTE — Telephone Encounter (Signed)
Pt calling back about meds

## 2022-07-16 NOTE — Telephone Encounter (Signed)
ATC X1 LVM for patient to call the office back 

## 2022-07-16 NOTE — Telephone Encounter (Signed)
PT ret Paige's call earlier today. Please try again. [phone]

## 2022-07-17 NOTE — Telephone Encounter (Signed)
Spoke with the pt  She is calling stating that her neb machine is not working well anymore  She needs new machine  I explained that she is overdue appt, and we can order new machine but not sure if her ins will cover since not seen in > 1 yr   I went ahead and placed order, as she insisted to do so and I have scheduled her with TP in [facility] for tomorrow   Nothing further needed

## 2022-07-17 NOTE — Telephone Encounter (Signed)
Called pt and she picked up the phone and then the call was lost  I am still unsure what she needs due to call being dropped  Called her back 5 x and line was busy  Will try back

## 2022-07-18 ENCOUNTER — Ambulatory Visit: Payer: 59 | Admitting: Adult Health

## 2022-07-30 ENCOUNTER — Other Ambulatory Visit: Payer: Self-pay | Admitting: Internal Medicine

## 2022-07-30 ENCOUNTER — Other Ambulatory Visit: Payer: Self-pay | Admitting: Nurse Practitioner

## 2022-08-20 ENCOUNTER — Other Ambulatory Visit: Payer: Self-pay | Admitting: Internal Medicine

## 2022-09-01 ENCOUNTER — Other Ambulatory Visit: Payer: Self-pay | Admitting: Pulmonary Disease

## 2022-09-01 ENCOUNTER — Other Ambulatory Visit: Payer: Self-pay | Admitting: Internal Medicine

## 2022-09-01 DIAGNOSIS — J441 Chronic obstructive pulmonary disease with (acute) exacerbation: Secondary | ICD-10-CM

## 2022-09-01 DIAGNOSIS — J432 Centrilobular emphysema: Secondary | ICD-10-CM

## 2022-09-10 DIAGNOSIS — B9689 Other specified bacterial agents as the cause of diseases classified elsewhere: Secondary | ICD-10-CM | POA: Diagnosis not present

## 2022-09-10 DIAGNOSIS — J209 Acute bronchitis, unspecified: Secondary | ICD-10-CM | POA: Diagnosis not present

## 2022-09-10 DIAGNOSIS — J019 Acute sinusitis, unspecified: Secondary | ICD-10-CM | POA: Diagnosis not present

## 2022-09-10 DIAGNOSIS — J441 Chronic obstructive pulmonary disease with (acute) exacerbation: Secondary | ICD-10-CM | POA: Diagnosis not present

## 2022-10-07 ENCOUNTER — Other Ambulatory Visit: Payer: Self-pay | Admitting: Pulmonary Disease

## 2022-10-31 ENCOUNTER — Telehealth: Payer: Self-pay | Admitting: Pulmonary Disease

## 2022-10-31 NOTE — Telephone Encounter (Signed)
Patient requesting refill on prednisone. Advised that she is overdue for an office. Last ov was 06/06/21. I advised her to go to urgent care or ED for evaluation and treatment. Patient reports she will wait until scheduled appt 11/21/22 with Tammy Parrett. As she has been dealing with this for weeks now.

## 2022-10-31 NOTE — Telephone Encounter (Signed)
Pt calling to req PREDNISONE  [id number] - [place], [place] - [address]

## 2022-11-08 ENCOUNTER — Other Ambulatory Visit: Payer: Self-pay | Admitting: Internal Medicine

## 2022-11-21 ENCOUNTER — Ambulatory Visit: Payer: 59 | Admitting: Adult Health

## 2022-11-27 ENCOUNTER — Encounter (HOSPITAL_COMMUNITY): Payer: Self-pay

## 2022-11-27 ENCOUNTER — Other Ambulatory Visit: Payer: Self-pay

## 2022-11-27 ENCOUNTER — Emergency Department (HOSPITAL_COMMUNITY): Payer: 59

## 2022-11-27 ENCOUNTER — Emergency Department (HOSPITAL_COMMUNITY)
Admission: EM | Admit: 2022-11-27 | Discharge: 2022-11-27 | Disposition: A | Discharge status: Home / Self Care | Payer: 59 | Attending: Emergency Medicine | Admitting: Emergency Medicine

## 2022-11-27 ENCOUNTER — Other Ambulatory Visit: Payer: Self-pay | Admitting: Cardiology

## 2022-11-27 DIAGNOSIS — I2510 Atherosclerotic heart disease of native coronary artery without angina pectoris: Secondary | ICD-10-CM | POA: Insufficient documentation

## 2022-11-27 DIAGNOSIS — Z7901 Long term (current) use of anticoagulants: Secondary | ICD-10-CM | POA: Insufficient documentation

## 2022-11-27 DIAGNOSIS — Z79899 Other long term (current) drug therapy: Secondary | ICD-10-CM | POA: Insufficient documentation

## 2022-11-27 DIAGNOSIS — Z720 Tobacco use: Secondary | ICD-10-CM | POA: Insufficient documentation

## 2022-11-27 DIAGNOSIS — E785 Hyperlipidemia, unspecified: Secondary | ICD-10-CM | POA: Diagnosis not present

## 2022-11-27 DIAGNOSIS — Z8249 Family history of ischemic heart disease and other diseases of the circulatory system: Secondary | ICD-10-CM | POA: Insufficient documentation

## 2022-11-27 DIAGNOSIS — R110 Nausea: Secondary | ICD-10-CM | POA: Diagnosis not present

## 2022-11-27 DIAGNOSIS — I10 Essential (primary) hypertension: Secondary | ICD-10-CM | POA: Insufficient documentation

## 2022-11-27 DIAGNOSIS — R0789 Other chest pain: Secondary | ICD-10-CM | POA: Diagnosis not present

## 2022-11-27 DIAGNOSIS — R0602 Shortness of breath: Secondary | ICD-10-CM | POA: Insufficient documentation

## 2022-11-27 DIAGNOSIS — Z7982 Long term (current) use of aspirin: Secondary | ICD-10-CM | POA: Diagnosis not present

## 2022-11-27 DIAGNOSIS — Z8673 Personal history of transient ischemic attack (TIA), and cerebral infarction without residual deficits: Secondary | ICD-10-CM | POA: Diagnosis not present

## 2022-11-27 DIAGNOSIS — Z86711 Personal history of pulmonary embolism: Secondary | ICD-10-CM | POA: Diagnosis not present

## 2022-11-27 DIAGNOSIS — I480 Paroxysmal atrial fibrillation: Secondary | ICD-10-CM | POA: Insufficient documentation

## 2022-11-27 DIAGNOSIS — R079 Chest pain, unspecified: Secondary | ICD-10-CM | POA: Insufficient documentation

## 2022-11-27 DIAGNOSIS — J449 Chronic obstructive pulmonary disease, unspecified: Secondary | ICD-10-CM | POA: Diagnosis not present

## 2022-11-27 DIAGNOSIS — Z955 Presence of coronary angioplasty implant and graft: Secondary | ICD-10-CM | POA: Diagnosis not present

## 2022-11-27 DIAGNOSIS — Z8616 Personal history of COVID-19: Secondary | ICD-10-CM | POA: Diagnosis not present

## 2022-11-27 DIAGNOSIS — Z743 Need for continuous supervision: Secondary | ICD-10-CM | POA: Diagnosis not present

## 2022-11-27 DIAGNOSIS — F17210 Nicotine dependence, cigarettes, uncomplicated: Secondary | ICD-10-CM | POA: Diagnosis not present

## 2022-11-27 DIAGNOSIS — I255 Ischemic cardiomyopathy: Secondary | ICD-10-CM | POA: Diagnosis not present

## 2022-11-27 DIAGNOSIS — Z85038 Personal history of other malignant neoplasm of large intestine: Secondary | ICD-10-CM | POA: Insufficient documentation

## 2022-11-27 LAB — BASIC METABOLIC PANEL
Anion gap: 10 (ref 5–15)
BUN: 12 mg/dL (ref 6–20)
CO2: 20 mmol/L — ABNORMAL LOW (ref 22–32)
Calcium: 9.2 mg/dL (ref 8.9–10.3)
Chloride: 108 mmol/L (ref 98–111)
Creatinine, Ser: 0.89 mg/dL (ref 0.44–1.00)
GFR, Estimated: >60 mL/min (ref >60)
Glucose, Bld: 97 mg/dL (ref 70–99)
Potassium: 3.8 mmol/L (ref 3.5–5.1)
Sodium: 138 mmol/L (ref 135–145)

## 2022-11-27 LAB — TROPONIN I (HIGH SENSITIVITY)
Troponin I (High Sensitivity): 8 ng/L (ref <18)
Troponin I (High Sensitivity): 7 ng/L (ref <18)

## 2022-11-27 LAB — CBC
HCT: 39.2 % (ref 36.0–46.0)
Hemoglobin: 13.6 g/dL (ref 12.0–15.0)
MCH: 31.3 pg (ref 26.0–34.0)
MCHC: 34.7 g/dL (ref 30.0–36.0)
MCV: 90.3 fL (ref 80.0–100.0)
Platelets: 306 10*3/uL (ref 150–400)
RBC: 4.34 MIL/uL (ref 3.87–5.11)
RDW: 14.3 % (ref 11.5–15.5)
WBC: 9.9 10*3/uL (ref 4.0–10.5)
nRBC: 0 % (ref 0.0–0.2)

## 2022-11-27 LAB — BRAIN NATRIURETIC PEPTIDE: B Natriuretic Peptide: 29.8 pg/mL (ref 0.0–100.0)

## 2022-11-27 NOTE — Consult Note (Signed)
Cardiology Consultation   Patient ID: Kimberly Kemp MRN: [id number]; DOB: December 03, 1965  Admit date: 11/27/2022 Date of Consult: 11/27/2022  PCP:  Alease Medina, MD   [facility] Providers Cardiologist:  Yvonne Kendall, MD        Patient Profile:   Kimberly Kemp is a 57 y.o. female with a hx of CAD status post PCI to the RCA in January 2021 complicated by iatrogenic dissection of the RCA, ischemic cardiomyopathy, PSVT, hypertension, familial hyperlipidemia, stroke, reported history of PAF and pulmonary embolism (on Eliquis), COPD, colon cancer, hepatitis B, anxiety, subclavian artery stenosis, carotid disease, and tobacco abuse  who is being seen 11/27/2022 for the evaluation of chest pain at the request of Dr. Nonnie Done.  History of Present Illness:   Kimberly Kemp presented to the ED for evaluation of chest pain and shortness of breath with nausea/vomiting. Symptoms began suddenly this AM with left sided substernal chest pain. No exertional component noted. Pain reported as different from that felt with MI in 2021 with today's symptoms being more rapid in onset. Given significant discomfort, patient took nitroglycerin and called EMS. She was given a second nitroglycerin as well as full dose ASA en route with some improvement in discomfort. Also had notable decrease in BP.   In addition to above: Prior RCA stent and asthma, presented with an acute episode of severe chest discomfort, diaphoresis, and vomiting. The symptoms began suddenly upon waking, initially manifesting as profuse sweating followed by multiple episodes of vomiting. Shortly after, the patient experienced a heavy sensation in the chest, which was severe enough to cause her to fall to the floor. The patient described feeling weak and having difficulty breathing, but denied loss of consciousness. The chest discomfort and associated symptoms persisted until the arrival of EMS. The patient had taken a nitrate and four baby  aspirins as advised by the 911 dispatcher prior to EMS arrival. The patient reported that her heart rhythm had been off since the episode.  The patient is a smoker, but reports recent improvement in reducing the amount smoked. She also has a history of stents placement for heart disease (RCA) and is on Eliquis for anticoagulation, compliant. The patient denied any recent changes in diet or any unusual meals, and no one else in her household was ill. The patient also denied any diarrhea.  The patient reported a small amount of leg swelling but denied any further episodes of vomiting since arriving at the hospital. She also reported feeling better since the episode and expressed a strong desire to return home, where she felt more at ease and had family support.  Patient followed closely by [facility] team, last seen by Ward Givens, NP on 07/03/22. A review of recent cardiac notes shows that she was seen in April for evaluation of chest pain (intermittent substernal pain with palpitations). Troponin in April negative. A zio was placed showing sinus rhythm with rare PACs and PVCs. Patient symptomatic events were associated with PACs and PVCs. Appears that historically there has been a significant amount of stress as a result of patient's housing arrangement with ex-husband.   Past Medical History:  Diagnosis Date   ? h/o Paroxysmal atrial fibrillation [facility])    ? h/o Pulmonary embolism (HCC)    Anxiety    Asthma    Atypical chest pain    a. 09/2018 ETT: poor exercise tolerance; b. 11/2018 Dobutamine Echo: No wall motion abnormalities, EF 55-60%.   CAD (coronary artery disease)  a. 02/2019 Cath/PCI: LM nl, LAD nl, LCX nl, RCA 38m (attempted iFR->wire dissection-->3.0x38 Resolute Onyx DES prox, 2.75x38 Resolute Onyx DES mid), RPDA jailed by stent. EF 65%; b. 11/2019 MV: EF 55-65%. medium-sized apical defect - likely diaph atten. No ischemia-->low risk.   Carotid arterial disease (HCC)    a. 03/2021 U/S:  RICA 1-39%, LICA 1-39%. Bilat ECA <50%.   Chronic pain    Cirrhosis (HCC)    Colon cancer (HCC)    COPD (chronic obstructive pulmonary disease) (HCC)    COVID-19 virus infection 11/2019   Depression    Emphysema    GERD (gastroesophageal reflux disease)    Heart murmur    Hepatitis B    History of echocardiogram    a. 02/2019 Echo: EF 55-60%. No rwma. No significant valvular disease.   Hyperlipidemia    Hypertension    PSVT (paroxysmal supraventricular tachycardia) (HCC)    a. 02/2019 Zio: Sinus, avg 83 (55-135). Rare PACs/PVCs. Four atrial runs, longest 7 beats, fastest 146 bpm. Triggered event->sinus rhythm & artifact; b. 05/2022 Zio: Predominantly sinus rhythm at 73 (50-121).  Rare PACs/PVCs.  2 brief SVT runs.  Brief Mobitz 1.  No signift arrhyth or prolonged pauses. Triggered events = sinus, PACs, and PVCs.   Stroke [facility])    Tobacco abuse     Past Surgical History:  Procedure Laterality Date   APPENDECTOMY     microscopic    BRONCHOSCOPY  March 2008, Feb 2012   CARDIAC CATHETERIZATION     CESAREAN SECTION     CORONARY PRESSURE/FFR STUDY N/A 02/22/2019   Procedure: INTRAVASCULAR PRESSURE WIRE/FFR STUDY;  Surgeon: Yvonne Kendall, MD;  Location: [facility] INVASIVE CV LAB;  Service: Cardiovascular;  Laterality: N/A;   CORONARY STENT INTERVENTION N/A 02/22/2019   Procedure: CORONARY STENT INTERVENTION;  Surgeon: Yvonne Kendall, MD;  Location: [facility] INVASIVE CV LAB;  Service: Cardiovascular;  Laterality: N/A;   RIGHT/LEFT HEART CATH AND CORONARY ANGIOGRAPHY N/A 02/22/2019   Procedure: RIGHT/LEFT HEART CATH AND CORONARY ANGIOGRAPHY;  Surgeon: Yvonne Kendall, MD;  Location: [facility] INVASIVE CV LAB;  Service: Cardiovascular;  Laterality: N/A;     Home Medications:  Prior to Admission medications   Medication Sig Start Date End Date Taking? Authorizing Provider  acetaminophen-codeine (TYLENOL #3) 300-30 MG tablet Take 1-2 tablets by mouth every 4 (four) hours as needed for moderate pain.  10/03/21   Candelaria Stagers, DPM  albuterol (PROVENTIL) (2.5 MG/3ML) 0.083% nebulizer solution Take 3 mLs (2.5 mg total) by nebulization every 6 (six) hours as needed for wheezing or shortness of breath. **NEEDS OV FOR FURTHER REFILLS** 09/02/22   Coralyn Helling, MD  albuterol (VENTOLIN HFA) 108 (90 Base) MCG/ACT inhaler INHALE 2 PUFFS INTO THE LUNGS EVERY 4 HOURS AS NEEDED FOR WHEEZE OR FOR SHORTNESS OF BREATH 08/21/21   Coralyn Helling, MD  aspirin EC 81 MG tablet Take 81 mg by mouth daily.    [provider]  budesonide (PULMICORT) 0.25 MG/2ML nebulizer solution INHALE 1 VIAL VIA NEBULIZER TWICE A DAY 03/20/22   Coralyn Helling, MD  butalbital-acetaminophen-caffeine (FIORICET, ESGIC) 50-325-40 MG tablet Take 1 tablet by mouth 3 (three) times daily as needed for headache. 11/19/17   [provider]  cetirizine (ZYRTEC) 10 MG tablet Take 10 mg by mouth daily. 05/19/22   [provider]  clonazePAM (KLONOPIN) 0.5 MG tablet Take 0.5 mg by mouth 2 (two) times daily. 05/19/22   [provider]  diltiazem (CARDIZEM CD) 240 MG 24 hr capsule Take 1 capsule (  240 mg total) by mouth daily. 05/23/22 08/21/22  Creig Hines, NP  ezetimibe (ZETIA) 10 MG tablet Take 1 tablet (10 mg total) by mouth daily. 05/23/22   Creig Hines, NP  famotidine (PEPCID) 40 MG tablet Take 40 mg by mouth at bedtime.    [provider]  gabapentin (NEURONTIN) 300 MG capsule Take 300 mg by mouth at bedtime. 11/14/17   [provider]  ipratropium (ATROVENT) 0.02 % nebulizer solution USE I VIAL BY NEBULIZATION EVERY 6 HOURS AS NEEDED FOR WHEEZING OR SHORTNESS OF BREATH. **NEEDS OFFICE VISIT** 10/08/22   Coralyn Helling, MD  magic mouthwash w/lidocaine SOLN Take 10 mLs by mouth 4 (four) times daily as needed for mouth pain. Patient not taking: Reported on 07/03/2022 09/10/18   Kem Boroughs B, FNP  metoprolol tartrate (LOPRESSOR) 50 MG tablet Take 1 tablet (50 mg total) by mouth 2  (two) times daily. 05/23/22   Creig Hines, NP  montelukast (SINGULAIR) 10 MG tablet Take 10 mg by mouth daily.    [provider]  nitroGLYCERIN (NITROSTAT) 0.4 MG SL tablet PLACE 1 TABLET UNDER THE TONGUE EVERY 5 (FIVE) MINUTES AS NEEDED FOR CHEST PAIN. MAXIMUM OF 3 DOSES. 07/30/22   Creig Hines, NP  nystatin (MYCOSTATIN) 100000 UNIT/ML suspension Take 5 mLs (500,000 Units total) by mouth 4 (four) times daily. Patient not taking: Reported on 07/03/2022 06/06/21   Coralyn Helling, MD  Omega-3 Fatty Acids (FISH OIL PO) Take 1 capsule by mouth 2 (two) times daily.    [provider]  omeprazole (PRILOSEC) 40 MG capsule TAKE 1 CAPSULE BY MOUTH EVERY DAY 05/12/19   Coralyn Helling, MD  oxyCODONE-acetaminophen (PERCOCET) 5-325 MG tablet Take 1 tablet by mouth every 4 (four) hours as needed for severe pain. 08/20/21   Candelaria Stagers, DPM  PAZEO 0.7 % SOLN Place 1 drop into both eyes daily. 11/26/17   [provider]  pramipexole (MIRAPEX) 1 MG tablet Take 1 mg by mouth at bedtime. 01/22/21   [provider]  promethazine (PHENERGAN) 25 MG tablet Take 25 mg by mouth every 4 (four) hours as needed. 05/19/22   [provider]  rosuvastatin (CRESTOR) 40 MG tablet TAKE 1 TABLET BY MOUTH EVERY DAY 11/04/21   Hilty, Lisette Abu, MD    Inpatient Medications: Scheduled Meds:  Continuous Infusions:  PRN Meds:   Allergies:    Allergies  Allergen Reactions   Penicillins Hives    Did it involve swelling of the face/tongue/throat, SOB, or low BP? No Did it involve sudden or severe rash/hives, skin peeling, or any reaction on the inside of your mouth or nose? Yes Did you need to seek medical attention at a hospital or doctor's office? Yes When did it last happen?      10 Years If all above answers are "NO", may proceed with cephalosporin use.    Omnipaque [Iohexol] Itching        Red Dye #40 (Allura Red) Itching    Social History:   Social  History   Socioeconomic History   Marital status: Married    Spouse name: Not on file   Number of children: 4   Years of education: Not on file   Highest education level: Not on file  Occupational History    Employer: UNEMPLOYED    Comment: on disability  Tobacco Use   Smoking status: Every Day    Current packs/day: 0.50    Average packs/day: 0.5 packs/day for 43.0 years (  21.5 ttl pk-yrs)    Types: Cigarettes   Smokeless tobacco: Never   Tobacco comments:    5 per day-06/06/2021  Vaping Use   Vaping status: Never Used  Substance and Sexual Activity   Alcohol use: No    Alcohol/week: 0.0 standard drinks of alcohol   Drug use: No   Sexual activity: Not on file  Other Topics Concern   Not on file  Social History Narrative   Lives in [place] with husband and daughter, on disability. Grandchildren visit daily.   Social Determinants of Health   Financial Resource Strain: Not on file  Food Insecurity: Not on file  Transportation Needs: Not on file  Physical Activity: Not on file  Stress: Not on file  Social Connections: Not on file  Intimate Partner Violence: Not on file    Family History:    Family History  Problem Relation Age of Onset   Emphysema Father    Prostate cancer Father    Liver disease Father    Emphysema Maternal Grandmother    Diabetes Mother    Heart disease Mother 52       CABG and valve replacement   Colon cancer Neg Hx      ROS:  Please see the history of present illness.  No syncope, no bleeding.  All other ROS reviewed and negative.     Physical Exam/Data:   Vitals:   11/27/22 1500 11/27/22 1503 11/27/22 1515 11/27/22 1530  BP: 124/72  126/71 121/83  Pulse:   72 62  Resp: 13  17 17   Temp:  99.6 F (37.6 C)    TempSrc:  Oral    SpO2:   99% 99%  Weight:  56.2 kg    Height:  5\' 3"  (1.6 m)     No intake or output data in the 24 hours ending 11/27/22 1942    11/27/2022    3:03 PM 07/03/2022    1:34 PM 05/23/2022   11:11 AM  Last  3 Weights  Weight (lbs) 124 lb 133 lb 6.4 oz 127 lb  Weight (kg) 56.246 kg 60.51 kg 57.607 kg     Body mass index is 21.97 kg/m.  General:  Well nourished, well developed, in no acute distress, thin, smells of smoke.  HEENT: normal Neck: no JVD Vascular: No carotid bruits; Distal pulses 2+ bilaterally Cardiac:  normal S1, S2; RRR; no murmur  Lungs:  clear to auscultation bilaterally, no wheezing, rhonchi or rales  Abd: soft, nontender, no hepatomegaly  Ext: no edema Musculoskeletal:  No deformities, BUE and BLE strength normal and equal Skin: warm and dry  Neuro:  CNs 2-12 intact, no focal abnormalities noted Psych:  Normal affect   EKG:  The EKG was personally reviewed and demonstrates:  SR with first degree AVB, low voltage. No ST changes.   Telemetry:  Telemetry was personally reviewed and demonstrates:  No VT, no AFIB.   Relevant CV Studies: Cardiac Studies & Procedures   CARDIAC CATHETERIZATION  CARDIAC CATHETERIZATION 02/22/2019  Narrative Conclusions: 1. Single vessel coronary artery disease with 60-70% mid RCA stenosis.  Attempted iFR of this lesion led to wire dissection extending from the ostial through the distal RCA and occlusion of PDA.  RCA dissection was treated with overlapping Resolute Onyx 2.75 x 38 mm and 3.0 x 38 mm drug-eluting stents (post-dilated to 3.1 mm). 2. No significant disease involving the left coronary artery. 3. Normal left heart filling pressures. 4. Mildly elevated right heart filling pressures. 5.  Normal pulmonary artery pressure with mild transpulmonic gradient. 6. Supranormal Fick cardiac output/index.  Recommendations: 1. Admit to ICU for post-PCI management of chest pain due to occluded rPDA.  Titrate NTG infusion as blood pressure tolerates for relief of chest pain. 2. Dual antiplatelet therapy with aspirin and ticagrelor for at least 12 months, ideally longer given long stented segment of the RCA. 3. Aggressive secondary prevention,  including high-intensity statin therapy and follow-up with the lipid clinic after discharge.  Yvonne Kendall, MD [facility]  Findings Coronary Findings Diagnostic  Dominance: Right  Left Main Vessel is moderate in size. Vessel is angiographically normal.  Left Anterior Descending Vessel is moderate in size. Vessel is angiographically normal. The vessel is moderately tortuous.  First Diagonal Branch Vessel is small in size.  Second Diagonal Branch Vessel is small in size.  Third Diagonal Branch Vessel is small in size.  Left Circumflex Vessel is moderate in size.  First Obtuse Marginal Branch Vessel is moderate in size.  Second Obtuse Marginal Branch Vessel is small in size.  Right Coronary Artery Vessel is large. Mid RCA lesion is 65% stenosed.  Right Posterior Descending Artery Vessel is moderate in size.  Right Posterior Atrioventricular Artery Vessel is moderate in size. Non-stenotic RPAV lesion.  Intervention  Mid RCA lesion Stent A drug-eluting stent was successfully placed. Stent A stent was successfully placed. Post-Intervention Lesion Assessment The intervention was successful. Pre-interventional TIMI flow is 3. Post-intervention TIMI flow is 3. There is a 0% residual stenosis post intervention.  RPAV lesion Stent A drug-eluting stent was successfully placed. Post-Intervention Lesion Assessment The intervention was successful. Pre-interventional TIMI flow is 3. Post-intervention TIMI flow is 3. There is a 0% residual stenosis post intervention.   STRESS TESTS  NM MYOCAR MULTI W/SPECT W 11/16/2019  Narrative  There was no ST segment deviation noted during stress.  There is a medium size fixed perfusion defect involving the apex. This likely represents artifact as there is significant diaphragmatic attenuations  This is a low risk study.  The left ventricular ejection fraction is normal (55-65%).  There was no evidence for  ischemia   ECHOCARDIOGRAM  ECHOCARDIOGRAM COMPLETE 02/23/2019  Narrative ECHOCARDIOGRAM REPORT    Patient Name:   SHERADYN KWIATEK Date of Exam: 02/23/2019 Medical Rec #:  [id number]    Height:       63.0 in Accession #:    [id number]   Weight:       127.6 lb Date of Birth:  1965/03/26    BSA:          1.60 m Patient Age:    53 years     BP:           152/77 mmHg Patient Gender: F            HR:           79 bpm. Exam Location:  [facility]  Procedure: 2D Echo, Color Doppler, Cardiac Doppler and Intracardiac Opacification Agent  Indications:     R07.9 Chest Pain  History:         Patient has prior history of Echocardiogram examinations, most recent 11/16/2018. CAD, Stroke and COPD; Risk Factors:Current Smoker, Hypertension and Dyslipidemia.  Sonographer:     Humphrey Rolls RDCS (AE) Referring Phys:  [id number] CHRISTOPHER END Diagnosing Phys: Debbe Odea MD   Sonographer Comments: Suboptimal parasternal window and suboptimal apical window. Image acquisition challenging due to COPD. IMPRESSIONS   1. Left ventricular ejection fraction, by visual estimation, is 55  to 60%. The left ventricle has normal function. There is no left ventricular hypertrophy. 2. The left ventricle demonstrates regional wall motion abnormalities. 3. Akinesis noted in the LV inferoseptal wall. 4. Global right ventricle has normal systolic function.The right ventricular size is normal. No increase in right ventricular wall thickness. 5. Left atrial size was normal. 6. Right atrial size was normal. 7. The mitral valve is normal in structure. No evidence of mitral valve regurgitation. 8. The tricuspid valve is normal in structure. 9. The tricuspid valve is normal in structure. Tricuspid valve regurgitation is not demonstrated. 10. The aortic valve is normal in structure. Aortic valve regurgitation is not visualized. 11. The pulmonic valve was not well visualized. Pulmonic valve regurgitation is not visualized. 12.  Normal pulmonary artery systolic pressure.  FINDINGS Left Ventricle: Left ventricular ejection fraction, by visual estimation, is 55 to 60%. The left ventricle has normal function. The left ventricle demonstrates regional wall motion abnormalities. The left ventricular internal cavity size was the left ventricle is normal in size. There is no left ventricular hypertrophy. Left ventricular diastolic parameters were normal. Akinesis noted in the LV inferoseptal wall.  Right Ventricle: The right ventricular size is normal. No increase in right ventricular wall thickness. Global RV systolic function is has normal systolic function. The tricuspid regurgitant velocity is 1.88 m/s, and with an assumed right atrial pressure of 10 mmHg, the estimated right ventricular systolic pressure is normal at 24.1 mmHg.  Left Atrium: Left atrial size was normal in size.  Right Atrium: Right atrial size was normal in size  Pericardium: There is no evidence of pericardial effusion.  Mitral Valve: The mitral valve is normal in structure. No evidence of mitral valve regurgitation. MV peak gradient, 4.3 mmHg.  Tricuspid Valve: The tricuspid valve is normal in structure. Tricuspid valve regurgitation is not demonstrated.  Aortic Valve: The aortic valve is normal in structure. Aortic valve regurgitation is not visualized. Aortic valve mean gradient measures 4.0 mmHg. Aortic valve peak gradient measures 6.6 mmHg. Aortic valve area, by VTI measures 2.09 cm.  Pulmonic Valve: The pulmonic valve was not well visualized. Pulmonic valve regurgitation is not visualized. Pulmonic regurgitation is not visualized.  Aorta: The aortic root is normal in size and structure.  IAS/Shunts: No atrial level shunt detected by color flow Doppler.   LEFT VENTRICLE PLAX 2D LVIDd:         3.85 cm  Diastology LVIDs:         2.73 cm  LV e' lateral:   12.10 cm/s LV PW:         0.77 cm  LV E/e' lateral: 7.8 LV IVS:        0.56 cm  LV e'  medial:    8.70 cm/s LVOT diam:     1.80 cm  LV E/e' medial:  10.9 LV SV:         36 ml LV SV Index:   22.46 LVOT Area:     2.54 cm   RIGHT VENTRICLE RV Basal diam:  3.11 cm  LEFT ATRIUM             Index       RIGHT ATRIUM          Index LA diam:        2.70 cm 1.69 cm/m  RA Area:     8.74 cm LA Vol (A2C):   26.5 ml 16.59 ml/m RA Volume:   15.20 ml 9.51 ml/m LA Vol (A4C):  24.7 ml 15.46 ml/m LA Biplane Vol: 25.9 ml 16.21 ml/m AORTIC VALVE                   PULMONIC VALVE AV Area (Vmax):    2.11 cm    PV Vmax:       0.98 m/s AV Area (Vmean):   2.15 cm    PV Vmean:      74.800 cm/s AV Area (VTI):     2.09 cm    PV VTI:        0.197 m AV Vmax:           128.00 cm/s PV Peak grad:  3.9 mmHg AV Vmean:          88.300 cm/s PV Mean grad:  2.0 mmHg AV VTI:            0.258 m AV Peak Grad:      6.6 mmHg AV Mean Grad:      4.0 mmHg LVOT Vmax:         106.00 cm/s LVOT Vmean:        74.500 cm/s LVOT VTI:          0.212 m LVOT/AV VTI ratio: 0.82  AORTA Ao Root diam: 2.70 cm  MITRAL VALVE                         TRICUSPID VALVE MV Area (PHT): 6.71 cm              TR Peak grad:   14.1 mmHg MV Peak grad:  4.3 mmHg              TR Vmax:        188.00 cm/s MV Mean grad:  2.0 mmHg MV Vmax:       1.04 m/s              SHUNTS MV Vmean:      67.9 cm/s             Systemic VTI:  0.21 m MV VTI:        0.26 m                Systemic Diam: 1.80 cm MV PHT:        32.77 msec MV Decel Time: 113 msec MV E velocity: 94.60 cm/s  103 cm/s MV A velocity: 102.00 cm/s 70.3 cm/s MV E/A ratio:  0.93        1.5   Debbe Odea MD Electronically signed by Debbe Odea MD Signature Date/Time: 02/23/2019/11:08:12 AM    Final    MONITORS  LONG TERM MONITOR (3-14 DAYS) 06/16/2022  Narrative   The patient was monitored for 13 days, 7 hours.   The predominant rhythm was sinus with an average rate of 73 bpm (range 50-121 bpm in sinus).   There were rare PAC's and PVC's.   Two  supraventricular runs occurred, lasting up to 9 beats with a maximum rate of 150 bpm.   A brief episode of Mobitz type 1 second segree AV block was observed.   There were no sustained arrhythmias or prolonged pauses.   Patient triggered events correspond to sinus rhythm, PAC's and PVC's (including ventricular bigeminy).  Predominantly sinus rhythm with rare PAC's and PVC's.  Two brief supraventricular runs were observed as well as transient Mobitz type 1 second degree AV block.            Laboratory Data:  High Sensitivity Troponin:   Recent Labs  Lab 11/27/22 1523 11/27/22 1709  TROPONINIHS 8 7     Chemistry Recent Labs  Lab 11/27/22 1811  NA 138  K 3.8  CL 108  CO2 20*  GLUCOSE 97  BUN 12  CREATININE 0.89  CALCIUM 9.2  GFRNONAA >60  ANIONGAP 10    No results for input(s): "PROT", "ALBUMIN", "AST", "ALT", "ALKPHOS", "BILITOT" in the last 168 hours. Lipids No results for input(s): "CHOL", "TRIG", "HDL", "LABVLDL", "LDLCALC", "CHOLHDL" in the last 168 hours.  Hematology Recent Labs  Lab 11/27/22 1523  WBC 9.9  RBC 4.34  HGB 13.6  HCT 39.2  MCV 90.3  MCH 31.3  MCHC 34.7  RDW 14.3  PLT 306   Thyroid No results for input(s): "TSH", "FREET4" in the last 168 hours.  BNP Recent Labs  Lab 11/27/22 1523  BNP 29.8    DDimer No results for input(s): "DDIMER" in the last 168 hours.   Radiology/Studies:  DG Chest 2 View  Result Date: 11/27/2022 CLINICAL DATA:  Chest pain EXAM: CHEST - 2 VIEW COMPARISON:  X-ray 05/26/2021 FINDINGS: Hyperinflation. No consolidation, pneumothorax or effusion. No edema. Normal cardiopericardial silhouette overlapping cardiac leads. Slight curvature of the spine. Presumed coronary stent. IMPRESSION: Hyperinflation.  No acute cardiopulmonary disease Electronically Signed   By: Karen Kays M.D.   On: 11/27/2022 17:23     Assessment and Plan:   Sudden onset precordial chest pain Hx CAD  Patient presented to the ED today for  evaluation after sudden onset of substernal chest pain this morning after vomiting. No exertional component to pain but did have improvement with sublingual nitroglycerin.  -ECG overall unchanged from prior. Low voltage limb leads with borderline LVH in precordial leads. No acute ischemic changes. Troponin reassuring 8->7. Workup not suggestive of ACS. Has been compliant with Eliquis - very low chance of PE.   -Could be GI related illness with GERD/esophageal irritation from vomiting causing discomfort. Now feels better and wants to go home.   -Schedule outpatient stress test to further evaluate cardiac function. She is amenable to this. If symptoms worsen or become more worrisome, return to ER.   Hyperlipidemia  LDL 82 as of 05/23/22. This was elevated from prior with patient admitting to some stress eating. Ideal LDL <70. Continue statin and Zetia.   Stop smoking. Discussed.   Patient is on Eliquis for anticoagulation prior PE. -Continue Eliquis as prescribed.  Discussed with ER MD.     For questions or updates, please contact [facility] Please consult [email or web address] for contact info under

## 2022-11-27 NOTE — ED Notes (Signed)
Patient transported to X-ray 

## 2022-11-27 NOTE — Discharge Instructions (Signed)
The Cardiology doctors will call you to set up a follow up and stress test. Continue your Eliquis. Return to the ED with any new or worsening symptoms.

## 2022-11-27 NOTE — Progress Notes (Signed)
Erroneous enounter

## 2022-11-27 NOTE — ED Provider Notes (Signed)
[place] EMERGENCY DEPARTMENT AT [facility] Provider Note   CSN: [id number] Arrival date & time: 11/27/22  1454     History  Chief Complaint  Patient presents with   Chest Pain    Kimberly Kemp is a 57 y.o. female.    This is a 57 year old female with PMH CAD s/p PCI to RCA in Jan 2021 c/b iatrogenic RCA dissection, ischemic cardiomyopathy, PSVT, HTN, familial HLD, stroke, PE on Eliquis, COPD, colon cancer, Hep B, Subclavian Artery Stenosis, tobacco use presents to the ED for sudden onset chest pain.  Patient had sudden onset left-sided substernal chest pain and pressure sensation at 7 AM this morning while at rest.  Associated with shortness of breath, nausea and vomiting.  Also endorses cough but no fevers or change in sputum production.  States is different from prior pain she had with MI because this pain was very sudden onset.  She took a nitroglycerin at home and was given another sublingual nitro by EMS, it improved her pain but dropped her blood pressure to 100 systolic.  Given 324 mg ASA with EMS.  At time of evaluation pain has not completely resolved but is improving.  History of PE, but no leg swelling or leg pain cancer or chemo recent surgery or immobilization, no hemoptysis.  Denies any drug use  The history is provided by the patient and medical records.       Home Medications Prior to Admission medications   Medication Sig Start Date End Date Taking? Authorizing Provider  acetaminophen-codeine (TYLENOL #3) 300-30 MG tablet Take 1-2 tablets by mouth every 4 (four) hours as needed for moderate pain. 10/03/21   Candelaria Stagers, DPM  albuterol (PROVENTIL) (2.5 MG/3ML) 0.083% nebulizer solution Take 3 mLs (2.5 mg total) by nebulization every 6 (six) hours as needed for wheezing or shortness of breath. **NEEDS OV FOR FURTHER REFILLS** 09/02/22   Coralyn Helling, MD  albuterol (VENTOLIN HFA) 108 (90 Base) MCG/ACT inhaler INHALE 2 PUFFS INTO THE LUNGS EVERY 4 HOURS AS  NEEDED FOR WHEEZE OR FOR SHORTNESS OF BREATH 08/21/21   Coralyn Helling, MD  aspirin EC 81 MG tablet Take 81 mg by mouth daily.    [provider]  budesonide (PULMICORT) 0.25 MG/2ML nebulizer solution INHALE 1 VIAL VIA NEBULIZER TWICE A DAY 03/20/22   Coralyn Helling, MD  butalbital-acetaminophen-caffeine (FIORICET, ESGIC) 50-325-40 MG tablet Take 1 tablet by mouth 3 (three) times daily as needed for headache. 11/19/17   [provider]  cetirizine (ZYRTEC) 10 MG tablet Take 10 mg by mouth daily. 05/19/22   [provider]  clonazePAM (KLONOPIN) 0.5 MG tablet Take 0.5 mg by mouth 2 (two) times daily. 05/19/22   [provider]  diltiazem (CARDIZEM CD) 240 MG 24 hr capsule Take 1 capsule (240 mg total) by mouth daily. 05/23/22 08/21/22  Creig Hines, NP  ezetimibe (ZETIA) 10 MG tablet Take 1 tablet (10 mg total) by mouth daily. 05/23/22   Creig Hines, NP  famotidine (PEPCID) 40 MG tablet Take 40 mg by mouth at bedtime.    [provider]  gabapentin (NEURONTIN) 300 MG capsule Take 300 mg by mouth at bedtime. 11/14/17   [provider]  ipratropium (ATROVENT) 0.02 % nebulizer solution USE I VIAL BY NEBULIZATION EVERY 6 HOURS AS NEEDED FOR WHEEZING OR SHORTNESS OF BREATH. **NEEDS OFFICE VISIT** 10/08/22   Coralyn Helling, MD  magic mouthwash w/lidocaine SOLN Take 10 mLs by mouth 4 (four) times daily  as needed for mouth pain. Patient not taking: Reported on 07/03/2022 09/10/18   Kem Boroughs B, FNP  metoprolol tartrate (LOPRESSOR) 50 MG tablet Take 1 tablet (50 mg total) by mouth 2 (two) times daily. 05/23/22   Creig Hines, NP  montelukast (SINGULAIR) 10 MG tablet Take 10 mg by mouth daily.    [provider]  nitroGLYCERIN (NITROSTAT) 0.4 MG SL tablet PLACE 1 TABLET UNDER THE TONGUE EVERY 5 (FIVE) MINUTES AS NEEDED FOR CHEST PAIN. MAXIMUM OF 3 DOSES. 07/30/22   Creig Hines, NP  nystatin (MYCOSTATIN) 100000  UNIT/ML suspension Take 5 mLs (500,000 Units total) by mouth 4 (four) times daily. Patient not taking: Reported on 07/03/2022 06/06/21   Coralyn Helling, MD  Omega-3 Fatty Acids (FISH OIL PO) Take 1 capsule by mouth 2 (two) times daily.    [provider]  omeprazole (PRILOSEC) 40 MG capsule TAKE 1 CAPSULE BY MOUTH EVERY DAY 05/12/19   Coralyn Helling, MD  oxyCODONE-acetaminophen (PERCOCET) 5-325 MG tablet Take 1 tablet by mouth every 4 (four) hours as needed for severe pain. 08/20/21   Candelaria Stagers, DPM  PAZEO 0.7 % SOLN Place 1 drop into both eyes daily. 11/26/17   [provider]  pramipexole (MIRAPEX) 1 MG tablet Take 1 mg by mouth at bedtime. 01/22/21   [provider]  promethazine (PHENERGAN) 25 MG tablet Take 25 mg by mouth every 4 (four) hours as needed. 05/19/22   [provider]  rosuvastatin (CRESTOR) 40 MG tablet TAKE 1 TABLET BY MOUTH EVERY DAY 11/04/21   Hilty, Lisette Abu, MD      Allergies    Penicillins, Omnipaque [iohexol], and Red dye #40 (allura red)    Review of Systems   Review of Systems as per HPI above  Physical Exam Updated Vital Signs BP 121/75   Pulse 67   Temp 98.9 F (37.2 C) (Oral)   Resp 16   Ht 5\' 3"  (1.6 m)   Wt 56.2 kg   LMP 07/18/2016   SpO2 97%   BMI 21.97 kg/m  Physical Exam Constitutional:      General: She is not in acute distress.    Appearance: She is well-developed. She is not ill-appearing.  HENT:     Head: Normocephalic and atraumatic.  Eyes:     Extraocular Movements: Extraocular movements intact.     Pupils: Pupils are equal, round, and reactive to light.  Neck:     Vascular: No JVD.  Cardiovascular:     Rate and Rhythm: Normal rate and regular rhythm.     Pulses:          Radial pulses are 2+ on the right side and 2+ on the left side.       Posterior tibial pulses are 2+ on the right side and 2+ on the left side.     Heart sounds: Normal heart sounds. Heart sounds not distant. No murmur heard.    No  systolic murmur is present.     No diastolic murmur is present.     No friction rub. No gallop.  Pulmonary:     Effort: Pulmonary effort is normal. No tachypnea or respiratory distress.     Breath sounds: Normal breath sounds.  Chest:     Chest wall: No tenderness.  Abdominal:     Palpations: Abdomen is soft.     Tenderness: There is no abdominal tenderness. There is no guarding or rebound.  Musculoskeletal:     Cervical  back: Neck supple.     Right lower leg: No tenderness. No edema.     Left lower leg: No tenderness. No edema.  Skin:    General: Skin is warm and dry.     Capillary Refill: Capillary refill takes less than 2 seconds.  Neurological:     General: No focal deficit present.     Mental Status: She is alert.     Cranial Nerves: No cranial nerve deficit.     Motor: No weakness.     ED Results / Procedures / Treatments   Labs (all labs ordered are listed, but only abnormal results are displayed) Labs Reviewed  BASIC METABOLIC PANEL - Abnormal; Notable for the following components:      Result Value   CO2 20 (*)    All other components within normal limits  CBC  BRAIN NATRIURETIC PEPTIDE  D-DIMER, QUANTITATIVE  TROPONIN I (HIGH SENSITIVITY)  TROPONIN I (HIGH SENSITIVITY)    EKG EKG Interpretation Date/Time:  Thursday November 27 2022 15:03:17 EDT Ventricular Rate:  68 PR Interval:  224 QRS Duration:  99 QT Interval:  425 QTC Calculation: 452 R Axis:   -5  Text Interpretation: Sinus rhythm Prolonged PR interval Low voltage, extremity leads Probable left ventricular hypertrophy Confirmed by Alona Bene [phone]) on 11/27/2022 3:17:20 PM  Radiology DG Chest 2 View  Result Date: 11/27/2022 CLINICAL DATA:  Chest pain EXAM: CHEST - 2 VIEW COMPARISON:  X-ray 05/26/2021 FINDINGS: Hyperinflation. No consolidation, pneumothorax or effusion. No edema. Normal cardiopericardial silhouette overlapping cardiac leads. Slight curvature of the spine. Presumed coronary  stent. IMPRESSION: Hyperinflation.  No acute cardiopulmonary disease Electronically Signed   By: Karen Kays M.D.   On: 11/27/2022 17:23    Procedures Procedures    Medications Ordered in ED Medications - No data to display  ED Course/ Medical Decision Making/ A&P                                 Medical Decision Making 57 year old female with prior CAD, prior PE on Eliquis, several ACS risk factors, COPD presenting with high risk chest pain.  [place] considered includes ACS, PE, COPD exacerbation, pneumonia pneumothorax, pericarditis, arrhythmia, aortic dissection, new CHF diagnosis.  Clinically I am most concerned for ACS given her history, plan for EKG and delta trop; already given ASA. Considering PE, Wells score is 1.5 low risk; will further evaluate with d-dimer.  Will obtain chest x-ray to evaluate for pneumonia or pneumothorax.  Will monitor on telemetry watch for arrhythmia.  Lower concern for dissection given reassuring vitals, normal neuro and vascular exam, workup not indicated.  Lower suspicion for COPD exacerbation given no fevers or change in sputum production, no wheezing on exam.  Concern for CHF exacerbation given does not appear grossly hypervolemic on exam or hypertensive, but will obtain BNP and evaluate her CXR for signs of fluid overload.   EKG shows normal sinus rhythm rate of 68, normal axis, 1st degree HB with PR of 224; no bundle branch block; normal Qtc, no ST segment elevations or depressions, no Wellens waves, no pathologic T wave inversions, overall no significant ischemic changes  Labs with normal hemoglobin, no leukocytosis, trop 8 and 7 no delta, BNP unremarkable, renal function normal, d-dimer in process    CXR shows hyperinflation but no acute cardiopulmonary abnormality, no pneumonia or pneumothorax on my independent interpretation.  I discussed the patient with cardiology given high risk  chest pain for admission for echo, stress test. Cardiology saw  patient at bedside and patient would like to go home.  After shared decision making patient would like to pursue outpatient management.  Cardiology has helped to arrange an echo and stress test for her in the outpatient setting.  She is stable and appropriate for discharge.  Decision was made not to wait on D-dimer to discharge made based on symptoms improving, continuously stable vitals, compliance with Eliquis. Patient was discharged in stable condition without further event after strict return precautions were discussed.   Amount and/or Complexity of Data Reviewed External Data Reviewed: ECG.    Details: Prior EKG Labs: ordered. Decision-making details documented in ED Course. Radiology: ordered and independent interpretation performed. Decision-making details documented in ED Course. ECG/medicine tests: ordered and independent interpretation performed. Decision-making details documented in ED Course.         Final Clinical Impression(s) / ED Diagnoses Final diagnoses:  Chest pain, unspecified type    Rx / DC Orders ED Discharge Orders     None         Karmen Stabs, MD 11/27/22 2024    Maia Plan, MD 12/02/22 1119

## 2022-11-27 NOTE — ED Triage Notes (Signed)
[facility] after having sudden onset of nausea vomiting x3. Central to left sided chest pain radiating to left arm. Pt took one Nitrotab at home then had one from ems. Pt reports nitrotab is helpful for pain. Pt had 324 of Asprin. Pt reports pain is better but still 6/10. Pain increases with movement but is also constant. Pt had some bigeminy on the monitor for ems.   20 L hand Ems 114/76 Hr 70 97% on ra Rr 14

## 2022-11-27 NOTE — ED Notes (Signed)
Lab called, PT's  d dimer hemolyzed, recollected and sent a new sample

## 2022-11-27 NOTE — ED Notes (Signed)
Got patient undressed into gown on the monitor did EKG shown to Dr Jacqulyn Bath patient is resting with call belll in reach

## 2022-11-28 ENCOUNTER — Telehealth: Payer: Self-pay | Admitting: Emergency Medicine

## 2022-11-28 DIAGNOSIS — R079 Chest pain, unspecified: Secondary | ICD-10-CM

## 2022-11-28 DIAGNOSIS — I200 Unstable angina: Secondary | ICD-10-CM

## 2022-11-28 NOTE — Telephone Encounter (Signed)
-----   Message from Perlie Gold sent at 11/27/2022  8:08 PM EDT ----- Regarding: myoview stress test Patient seen in consult this evening at [facility] ED. Discharged from ED before I could schedule an outpatient lexiscan myoview per Dr. Anne Fu recommendation. Please place order (happy to co-sign but results should be directed to Dr. Okey Dupre or Dr. Anne Fu) and schedule.   Perlie Gold, PA-C

## 2022-12-04 ENCOUNTER — Encounter (HOSPITAL_COMMUNITY): Admission: RE | Admit: 2022-12-04 | Payer: 59 | Source: Ambulatory Visit

## 2022-12-09 ENCOUNTER — Encounter (HOSPITAL_COMMUNITY)
Admission: RE | Admit: 2022-12-09 | Discharge: 2022-12-09 | Disposition: A | Discharge status: Home / Self Care | Payer: 59 | Source: Ambulatory Visit | Attending: Cardiology | Admitting: Cardiology

## 2022-12-09 ENCOUNTER — Other Ambulatory Visit (HOSPITAL_COMMUNITY): Payer: 59

## 2022-12-09 DIAGNOSIS — I200 Unstable angina: Secondary | ICD-10-CM | POA: Diagnosis not present

## 2022-12-09 DIAGNOSIS — R079 Chest pain, unspecified: Secondary | ICD-10-CM | POA: Insufficient documentation

## 2022-12-09 LAB — NM MYOCAR MULTI W/SPECT W/WALL MOTION / EF
Base ST Depression (mm): 0 mm
Estimated workload: 1
Exercise duration (min): 7 min
Exercise duration (sec): 10 s
MPHR: 163 {beats}/min
Nuc Stress EF: 64 %
Peak HR: 91 {beats}/min
Percent HR: 55 %
Rest HR: 63 {beats}/min
Rest Nuclear Isotope Dose: 9.8 mCi
ST Depression (mm): 0 mm
Stress Nuclear Isotope Dose: 29.2 mCi

## 2022-12-09 MED ORDER — REGADENOSON 0.4 MG/5ML IV SOLN
0.4000 mg | Freq: Once | INTRAVENOUS | Status: AC
  Administered 2022-12-09: 0.4 mg via INTRAVENOUS

## 2022-12-09 MED ORDER — REGADENOSON 0.4 MG/5ML IV SOLN
INTRAVENOUS | Status: AC
Start: 2022-12-09 — End: ?
  Filled 2022-12-09: qty 5

## 2022-12-09 MED ORDER — TECHNETIUM TC 99M TETROFOSMIN IV KIT
29.2000 | PACK | Freq: Once | INTRAVENOUS | Status: AC | PRN
  Administered 2022-12-09: 29.2 via INTRAVENOUS

## 2022-12-09 MED ORDER — TECHNETIUM TC 99M TETROFOSMIN IV KIT
9.8000 | PACK | Freq: Once | INTRAVENOUS | Status: AC | PRN
  Administered 2022-12-09: 9.8 via INTRAVENOUS

## 2022-12-14 NOTE — Progress Notes (Unsigned)
No show

## 2022-12-15 ENCOUNTER — Ambulatory Visit (INDEPENDENT_AMBULATORY_CARE_PROVIDER_SITE_OTHER): Payer: 59 | Admitting: Internal Medicine

## 2022-12-15 DIAGNOSIS — I25118 Atherosclerotic heart disease of native coronary artery with other forms of angina pectoris: Secondary | ICD-10-CM | POA: Diagnosis not present

## 2022-12-25 ENCOUNTER — Encounter: Payer: Self-pay | Admitting: Internal Medicine

## 2022-12-25 ENCOUNTER — Ambulatory Visit: Payer: 59 | Attending: Internal Medicine | Admitting: Internal Medicine

## 2022-12-25 VITALS — BP 108/62 | HR 81 | Ht 63.0 in | Wt 127.0 lb

## 2022-12-25 DIAGNOSIS — I10 Essential (primary) hypertension: Secondary | ICD-10-CM

## 2022-12-25 DIAGNOSIS — I25118 Atherosclerotic heart disease of native coronary artery with other forms of angina pectoris: Secondary | ICD-10-CM | POA: Diagnosis not present

## 2022-12-25 DIAGNOSIS — Z79899 Other long term (current) drug therapy: Secondary | ICD-10-CM | POA: Diagnosis not present

## 2022-12-25 DIAGNOSIS — E785 Hyperlipidemia, unspecified: Secondary | ICD-10-CM | POA: Diagnosis not present

## 2022-12-25 DIAGNOSIS — R002 Palpitations: Secondary | ICD-10-CM

## 2022-12-25 DIAGNOSIS — Z720 Tobacco use: Secondary | ICD-10-CM | POA: Diagnosis not present
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
## 2022-12-25 NOTE — Patient Instructions (Signed)
Medication Instructions:  Your physician recommends that you continue on your current medications as directed. Please refer to the Current Medication list given to you today.   *If you need a refill on your cardiac medications before your next appointment, please call your pharmacy*   Lab Work: Your provider would like for you to have following labs drawn today (Lipid, ALT).     Testing/Procedures: No test ordered today    Follow-Up: At [facility], you and your health needs are our priority.  As part of our continuing mission to provide you with exceptional heart care, we have created designated Provider Care Teams.  These Care Teams include your primary Cardiologist (physician) and Advanced Practice Providers (APPs -  Physician Assistants and Nurse Practitioners) who all work together to provide you with the care you need, when you need it.  We recommend signing up for the patient portal called "MyChart".  Sign up information is provided on this After Visit Summary.  MyChart is used to connect with patients for Virtual Visits (Telemedicine).  Patients are able to view lab/test results, encounter notes, upcoming appointments, etc.  Non-urgent messages can be sent to your provider as well.   To learn more about what you can do with MyChart, go to [email or web address].    Your next appointment:   6 month(s)  Provider:   You may see Yvonne Kendall, MD or one of the following Advanced Practice Providers on your designated Care Team:   Nicolasa Ducking, NP Eula Listen, PA-C Cadence Fransico Michael, PA-C Charlsie Quest, NP Carlos Levering, NP

## 2022-12-26 ENCOUNTER — Other Ambulatory Visit: Payer: Self-pay | Admitting: *Deleted

## 2022-12-26 ENCOUNTER — Telehealth: Payer: Self-pay | Admitting: Internal Medicine

## 2022-12-26 DIAGNOSIS — Z79899 Other long term (current) drug therapy: Secondary | ICD-10-CM

## 2022-12-26 DIAGNOSIS — R748 Abnormal levels of other serum enzymes: Secondary | ICD-10-CM

## 2022-12-26 LAB — LIPID PANEL
Chol/HDL Ratio: 3.4 ratio (ref 0.0–4.4)
Cholesterol, Total: 135 mg/dL (ref 100–199)
HDL: 40 mg/dL (ref >39)
LDL Chol Calc (NIH): 75 mg/dL (ref 0–99)
Triglycerides: 108 mg/dL (ref 0–149)
VLDL Cholesterol Cal: 20 mg/dL (ref 5–40)

## 2022-12-26 LAB — ALT: ALT: 70 [IU]/L — ABNORMAL HIGH (ref 0–32)

## 2022-12-26 NOTE — Telephone Encounter (Signed)
I called and spoke with the patient regarding her lab results and Dr. Okey Dupre recommendations to: Repeat a CMET in 1 month Minimize her use of acetaminophen (including as part of Fioricet and Percocet RX's)  The patient voices understanding of the above recommendations.  She is aware that the office will be closed the afternoon of 12/18 & the afternoon of 12/24.

## 2022-12-26 NOTE — Telephone Encounter (Signed)
Yvonne Kendall, MD 12/26/2022  8:55 AM EST     Please let Ms. Michalski know that her LDL (bad cholesterol) is still just above our goal of less than 70.  As she is already on the maximum doses of rosuvastatin and ezetimibe, I do not recommend any medication changes at this time but encouraged her to keep working on diet and exercise.  Her ALT (liver test) is also mildly abnormal, which is a new finding.  I recommend that we repeat a complete metabolic panel in about a month to ensure that her liver function is not worsening.  In the meantime, I recommend that she minimize her acetaminophen use (including as part of her Fioricet and Percocet prescriptions).  She should also avoid alcohol.

## 2023-01-09 ENCOUNTER — Telehealth: Payer: Self-pay | Admitting: Internal Medicine

## 2023-01-09 NOTE — Telephone Encounter (Signed)
*  STAT* If patient is at the pharmacy, call can be transferred to refill team.   1. Which medications need to be refilled? (please list name of each medication and dose if known) ezetimibe (ZETIA) 10 MG tablet  metoprolol tartrate (LOPRESSOR) 50 MG tablet   2. Which pharmacy/location (including street and city if local pharmacy) is medication to be sent to?  [id number] - [place], [place] - [address]    3. Do they need a 30 day or 90 day supply? 90

## 2023-01-12 NOTE — Telephone Encounter (Signed)
1 yr rx sent on 05/23/22  Confirmed with pharmacy rx received and refill available.

## 2023-01-13 ENCOUNTER — Telehealth: Payer: Self-pay | Admitting: Adult Health

## 2023-01-13 NOTE — Telephone Encounter (Signed)
albuterol (PROVENTIL) (2.5 MG/3ML) 0.083% nebulizer solution  budesonide (PULMICORT) 0.25 MG/2ML nebulizer solution ipratropium (ATROVENT) 0.02 % nebulizer solution    [id number] - [place], [place] - [address]

## 2023-01-14 ENCOUNTER — Encounter: Payer: Self-pay | Admitting: Primary Care

## 2023-01-14 ENCOUNTER — Ambulatory Visit: Payer: 59

## 2023-01-14 ENCOUNTER — Ambulatory Visit: Payer: 59 | Admitting: Primary Care

## 2023-01-14 VITALS — BP 122/70 | HR 74 | Temp 98.2°F | Ht 63.0 in | Wt 127.6 lb

## 2023-01-14 DIAGNOSIS — F17210 Nicotine dependence, cigarettes, uncomplicated: Secondary | ICD-10-CM

## 2023-01-14 DIAGNOSIS — J441 Chronic obstructive pulmonary disease with (acute) exacerbation: Secondary | ICD-10-CM

## 2023-01-14 DIAGNOSIS — I2510 Atherosclerotic heart disease of native coronary artery without angina pectoris: Secondary | ICD-10-CM | POA: Diagnosis not present

## 2023-01-14 DIAGNOSIS — R918 Other nonspecific abnormal finding of lung field: Secondary | ICD-10-CM | POA: Diagnosis not present

## 2023-01-14 DIAGNOSIS — J209 Acute bronchitis, unspecified: Secondary | ICD-10-CM | POA: Diagnosis not present

## 2023-01-14 MED ORDER — IPRATROPIUM-ALBUTEROL 0.5-2.5 (3) MG/3ML IN SOLN: 3.0000 mL | Freq: Four times a day (QID) | RESPIRATORY_TRACT | 5 refills | Status: DC | PRN

## 2023-01-14 MED ORDER — ALBUTEROL SULFATE HFA 108 (90 BASE) MCG/ACT IN AERS: 2.0000 | INHALATION_SPRAY | Freq: Four times a day (QID) | RESPIRATORY_TRACT | 2 refills | Status: DC | PRN

## 2023-01-14 MED ORDER — PROMETHAZINE-DM 6.25-15 MG/5ML PO SYRP: 5.0000 mL | ORAL_SOLUTION | Freq: Four times a day (QID) | ORAL | 0 refills | Status: DC | PRN

## 2023-01-14 MED ORDER — BUDESONIDE 0.25 MG/2ML IN SUSP: 0.2500 mg | Freq: Two times a day (BID) | RESPIRATORY_TRACT | 5 refills | Status: DC

## 2023-01-14 MED ORDER — MONTELUKAST SODIUM 10 MG PO TABS: 10.0000 mg | ORAL_TABLET | Freq: Every day | ORAL | 3 refills | Status: DC

## 2023-01-14 NOTE — Progress Notes (Signed)
@Patient  ID: Kimberly Kemp, female    DOB: 11-23-1965, 57 y.o.   MRN: [id number]  Chief Complaint  Patient presents with   Follow-up    Referring provider: Simonne Martinet, MD  HPI: 57 year old female, current everyday smoker. PMH significant HTN, CAD, COPD, emphysema, GERD, chronic cough, generalized anxiety disorder, hyperlipidemia. Former patient of Dr. Craige Cotta.  Previous [facility] pulmonary encounter: 04/09/2020 Patient presents today for regular COPD follow-up. Past several weeks she has noticed more dyspnea. She has a moderate-severe dry hacking cough with intermittent chest tightness and wheezing with activity. She is consistent with Pulmicort twice a day and Duonebs 4 times a day. She uses cough medication sparingly at night only. Continues to smoke 4-5 cigarettes a day. She has been smoking since the age of 6 0.25-0.5 packs per day. Discussed referral to lung cancer screening program. She received pneumococcal 23 vaccine in 2005 and prevnar 13 in 2019. She reports chest tightness with vaccinations and has opted on deferring further vaccinations.   01/14/2023 Discussed the use of AI scribe software for clinical note transcription with the patient, who gave verbal consent to proceed.  History of Present Illness   The patient, with a known history of COPD and emphysema, presents for a routine follow-up and medication refill. She expresses dissatisfaction with a recently prescribed white inhaler, stating that the red albuterol inhaler (ProAir) works better and faster for her. She is currently on three different breathing treatments, including Pulmicort (budesonide) twice daily, and ipratropium and albuterol nebulizers every four to six hours. She denies taking Singulair (montelukast), but does purchase Zyrtec over the counter.  Over the past four weeks, the patient reports being unwell, with symptoms including a sore throat and headache. She has been experiencing a persistent cough for the past  two months. The cough is particularly severe at night, disrupting her sleep. She reports no mucus production, postnasal drip, runny nose, or nasal congestion. However, during a severe coughing spell the previous night, she expectorated thick, white, foamy sputum.  The patient admits to current smoking, but has significantly reduced her intake to three to five cigarettes per day. She has quit smoking in the past and contemplates quitting daily due to a distasteful sensation associated with smoking. She also reports a sensation of something wanting to come out when coughing.      Pulmonary testing: A1AT 09/04/06 >> 236 Spirometry 10/26/09>>FEV1 2.59(88%), FEV1% 73 RAST 01/22/10 >> negative, IgE 11.9 Labs 01/22/10 >> ACE 46, RF < 20, ANA negative, ANCA negative PFT 07/14/18 >> FEV1 2.08 (77%), FEV1% 65, TLC 4.11 (83%), DLCO 70%  Imaging: CT chest 06/26/10 >> upper lobe emphysema CT chest 06/10/13 >> minimal centrilobular emphysema CT chest 07/13/18 >> moderate to advanced emphysema  Sleep study: HST 09/13/10 >> AHI 1, SpO2 low 91%    Allergies  Allergen Reactions   Penicillins Hives    Did it involve swelling of the face/tongue/throat, SOB, or low BP? No Did it involve sudden or severe rash/hives, skin peeling, or any reaction on the inside of your mouth or nose? Yes Did you need to seek medical attention at a hospital or doctor's office? Yes When did it last happen?      10 Years If all above answers are "NO", may proceed with cephalosporin use.    Omnipaque [Iohexol] Itching        Red Dye #40 (Allura Red) Itching    Immunization History  Administered Date(s) Administered   Influenza Split 11/05/2011  Influenza,inj,Quad PF,6+ Mos 11/03/2012   Pneumococcal Conjugate-13 10/06/2017   Pneumococcal Polysaccharide-23 11/04/2003, 01/30/2004   Tdap 01/18/2018    Past Medical History:  Diagnosis Date   ? h/o Paroxysmal atrial fibrillation [facility])    ? h/o Pulmonary embolism (HCC)     Anxiety    Asthma    Atypical chest pain    a. 09/2018 ETT: poor exercise tolerance; b. 11/2018 Dobutamine Echo: No wall motion abnormalities, EF 55-60%.   CAD (coronary artery disease)    a. 02/2019 Cath/PCI: LM nl, LAD nl, LCX nl, RCA 53m (attempted iFR->wire dissection-->3.0x38 Resolute Onyx DES prox, 2.75x38 Resolute Onyx DES mid), RPDA jailed by stent. EF 65%; b. 11/2019 MV: EF 55-65%. medium-sized apical defect - likely diaph atten. No ischemia-->low risk.   Carotid arterial disease (HCC)    a. 03/2021 U/S: RICA 1-39%, LICA 1-39%. Bilat ECA <50%.   Chronic pain    Cirrhosis (HCC)    Colon cancer (HCC)    COPD (chronic obstructive pulmonary disease) (HCC)    COVID-19 virus infection 11/2019   Depression    Emphysema    GERD (gastroesophageal reflux disease)    Heart murmur    Hepatitis B    History of echocardiogram    a. 02/2019 Echo: EF 55-60%. No rwma. No significant valvular disease.   Hyperlipidemia    Hypertension    PSVT (paroxysmal supraventricular tachycardia) (HCC)    a. 02/2019 Zio: Sinus, avg 83 (55-135). Rare PACs/PVCs. Four atrial runs, longest 7 beats, fastest 146 bpm. Triggered event->sinus rhythm & artifact; b. 05/2022 Zio: Predominantly sinus rhythm at 73 (50-121).  Rare PACs/PVCs.  2 brief SVT runs.  Brief Mobitz 1.  No signift arrhyth or prolonged pauses. Triggered events = sinus, PACs, and PVCs.   Stroke [facility])    Tobacco abuse     Tobacco History: Social History   Tobacco Use  Smoking Status Every Day   Current packs/day: 0.50   Average packs/day: 0.5 packs/day for 43.0 years (21.5 ttl pk-yrs)   Types: Cigarettes  Smokeless Tobacco Never  Tobacco Comments   5 per day-06/06/2021   Ready to quit: Not Answered Counseling given: Not Answered Tobacco comments: 5 per day-06/06/2021   Outpatient Medications Prior to Visit  Medication Sig Dispense Refill   acetaminophen-codeine (TYLENOL #3) 300-30 MG tablet Take 1-2 tablets by mouth every 4 (four) hours as  needed for moderate pain. 30 tablet 0   albuterol (PROVENTIL) (2.5 MG/3ML) 0.083% nebulizer solution Take 3 mLs (2.5 mg total) by nebulization every 6 (six) hours as needed for wheezing or shortness of breath. **NEEDS OV FOR FURTHER REFILLS** 375 mL 0   albuterol (VENTOLIN HFA) 108 (90 Base) MCG/ACT inhaler INHALE 2 PUFFS INTO THE LUNGS EVERY 4 HOURS AS NEEDED FOR WHEEZE OR FOR SHORTNESS OF BREATH 8.5 each 2   aspirin EC 81 MG tablet Take 81 mg by mouth daily.     budesonide (PULMICORT) 0.25 MG/2ML nebulizer solution INHALE 1 VIAL VIA NEBULIZER TWICE A DAY 360 mL 3   butalbital-acetaminophen-caffeine (FIORICET, ESGIC) 50-325-40 MG tablet Take 1 tablet by mouth 3 (three) times daily as needed for headache.  2   cetirizine (ZYRTEC) 10 MG tablet Take 10 mg by mouth daily.     clonazePAM (KLONOPIN) 0.5 MG tablet Take 0.5 mg by mouth 2 (two) times daily.     ezetimibe (ZETIA) 10 MG tablet Take 1 tablet (10 mg total) by mouth daily. 90 tablet 3   famotidine (PEPCID) 40 MG tablet Take 40  mg by mouth at bedtime.     gabapentin (NEURONTIN) 300 MG capsule Take 300 mg by mouth at bedtime.  1   ipratropium (ATROVENT) 0.02 % nebulizer solution USE I VIAL BY NEBULIZATION EVERY 6 HOURS AS NEEDED FOR WHEEZING OR SHORTNESS OF BREATH. **NEEDS OFFICE VISIT** 312.5 mL 3   magic mouthwash w/lidocaine SOLN Take 10 mLs by mouth 4 (four) times daily as needed for mouth pain. 120 mL 0   metoprolol tartrate (LOPRESSOR) 50 MG tablet Take 1 tablet (50 mg total) by mouth 2 (two) times daily. 180 tablet 3   montelukast (SINGULAIR) 10 MG tablet Take 10 mg by mouth daily.     nitroGLYCERIN (NITROSTAT) 0.4 MG SL tablet PLACE 1 TABLET UNDER THE TONGUE EVERY 5 (FIVE) MINUTES AS NEEDED FOR CHEST PAIN. MAXIMUM OF 3 DOSES. 75 tablet 1   nystatin (MYCOSTATIN) 100000 UNIT/ML suspension Take 5 mLs (500,000 Units total) by mouth 4 (four) times daily. 60 mL 0   Omega-3 Fatty Acids (FISH OIL PO) Take 1 capsule by mouth 2 (two) times daily.      omeprazole (PRILOSEC) 40 MG capsule TAKE 1 CAPSULE BY MOUTH EVERY DAY 30 capsule 2   oxyCODONE-acetaminophen (PERCOCET) 5-325 MG tablet Take 1 tablet by mouth every 4 (four) hours as needed for severe pain. 30 tablet 0   PAZEO 0.7 % SOLN Place 1 drop into both eyes daily.     pramipexole (MIRAPEX) 1 MG tablet Take 1 mg by mouth at bedtime.     promethazine (PHENERGAN) 25 MG tablet Take 25 mg by mouth every 4 (four) hours as needed.     rosuvastatin (CRESTOR) 40 MG tablet TAKE 1 TABLET BY MOUTH EVERY DAY 90 tablet 3   TIADYLT ER 180 MG 24 hr capsule Take 180 mg by mouth daily.     No facility-administered medications prior to visit.   Review of Systems  Review of Systems  Constitutional: Negative.   Respiratory:  Positive for cough.    Physical Exam  BP 122/70 (BP Location: Left Arm, Patient Position: Sitting, Cuff Size: Normal)   Pulse 74   Temp 98.2 F (36.8 C) (Oral)   Ht 5\' 3"  (1.6 m)   Wt 127 lb 9.6 oz (57.9 kg)   LMP 07/18/2016   SpO2 98%   BMI 22.60 kg/m  Physical Exam Constitutional:      Appearance: Normal appearance. She is not ill-appearing.  HENT:     Head: Normocephalic and atraumatic.     Mouth/Throat:     Mouth: Mucous membranes are moist.     Pharynx: Oropharynx is clear.  Cardiovascular:     Rate and Rhythm: Normal rate and regular rhythm.  Pulmonary:     Effort: Pulmonary effort is normal. No respiratory distress.     Breath sounds: Rhonchi present. No wheezing.  Musculoskeletal:        General: Normal range of motion.  Skin:    General: Skin is warm and dry.  Neurological:     General: No focal deficit present.     Mental Status: She is alert and oriented to person, place, and time. Mental status is at baseline.  Psychiatric:        Mood and Affect: Mood normal.        Behavior: Behavior normal.        Thought Content: Thought content normal.        Judgment: Judgment normal.      Lab Results:  CBC    Component  Value Date/Time   WBC  9.9 11/27/2022 1523   RBC 4.34 11/27/2022 1523   HGB 13.6 11/27/2022 1523   HCT 39.2 11/27/2022 1523   PLT 306 11/27/2022 1523   MCV 90.3 11/27/2022 1523   MCH 31.3 11/27/2022 1523   MCHC 34.7 11/27/2022 1523   RDW 14.3 11/27/2022 1523   LYMPHSABS 3.9 02/21/2019 1658   MONOABS 0.8 02/21/2019 1658   EOSABS 0.3 02/21/2019 1658   BASOSABS 0.1 02/21/2019 1658    BMET    Component Value Date/Time   NA 138 11/27/2022 1811   K 3.8 11/27/2022 1811   CL 108 11/27/2022 1811   CO2 20 (L) 11/27/2022 1811   GLUCOSE 97 11/27/2022 1811   BUN 12 11/27/2022 1811   CREATININE 0.89 11/27/2022 1811   CALCIUM 9.2 11/27/2022 1811   GFRNONAA >60 11/27/2022 1811   GFRAA >60 04/13/2019 1022    BNP    Component Value Date/Time   BNP 29.8 11/27/2022 1523    ProBNP    Component Value Date/Time   PROBNP 58.2 01/14/2010 1052    Imaging: No results found.   Assessment & Plan:   1. COPD exacerbation (HCC) - DG Chest 2 View; Future     Chronic Obstructive Pulmonary Disease (COPD) Patient reports increased coughing, especially at night, and has been using ipratropium and albuterol nebulizers every 4 hours. Ronchi noted on left lung during examination. Patient prefers ProAir over other inhalers. -Order chest x-ray today to assess for bronchitis -Discontinue separate ipratropium and albuterol nebulizers and start DuoNeb (combined ipratropium and albuterol) to be used every 4-6 hours as needed. -Continue Pulmicort (budesonide) nebulizer twice daily. -Prescribe promethazine DM for cough suppression as needed at night  -Advise patient to take Mucinex twice daily to loosen congestion until cough improves. -Consider antibiotic if CXR abnormal  -Schedule follow-up appointment in 6 months.  Smoking Patient currently smokes 3-5 cigarettes per day and has expressed interest in quitting. -Encourage patient to continue efforts to quit smoking.  Influenza Vaccination Patient declines flu shot due  to previous adverse reaction. -Respect patient's decision to decline flu shot.      Glenford Bayley, NP 01/14/2023

## 2023-01-14 NOTE — Patient Instructions (Addendum)
-  CHRONIC OBSTRUCTIVE PULMONARY DISEASE (COPD): COPD is a chronic lung condition that makes it hard to breathe. We will order a chest x-ray to check for bronchitis or other issues. You will switch to DuoNeb (a combination of ipratropium and albuterol) every 4-6 hours as needed, continue Pulmicort (budesonide) twice daily, and use promethazine DM for cough suppression as needed. If the x-ray shows bronchitis, we may prescribe an antibiotic. Please take Mucinex twice daily until your cough improves.  -SMOKING: Smoking can worsen COPD and other health issues. You currently smoke 3-5 cigarettes per day and are considering quitting. We encourage you to continue your efforts to quit smoking.  -MEDICATION REFILLS: We have refilled all your current medications as prescribed to ensure you have what you need.  -INFLUENZA VACCINATION: You have declined the flu shot due to a previous adverse reaction. We respect your decision.   Recommendations: Continue Pulmicort neb twice daily Use improprium-albuterol every 4-6 hours for shortness of breath  Start Mucinex 600mg  twice daily until cough is better Promethazine-dm as needed for cough suppression  Continue to cut back on smoking and pick quit date   Orders: CXR today  Follow-up: 6 months with Dewald (new patient -30 min)

## 2023-01-17 NOTE — Telephone Encounter (Signed)
Pt had OV 12/11 and meds were refilled during that visit.

## 2023-01-23 ENCOUNTER — Other Ambulatory Visit: Payer: Self-pay | Admitting: Primary Care

## 2023-01-23 ENCOUNTER — Telehealth: Payer: Self-pay

## 2023-01-23 MED ORDER — AZITHROMYCIN 250 MG PO TABS: ORAL_TABLET | ORAL | 0 refills | Status: DC

## 2023-01-23 NOTE — Telephone Encounter (Signed)
Called patient.  Reviewed all information.  Patient states cough is worse.  She has a deep cough with thick mucus.  Patient states she is using Duoneb q4-6h , Pulmicort nebs BID, Mucinex BID.  Promethazine DM only helps the cough for about 3-4 hours.  Please advise.   Mahek Schlesinger Theodis Sato, CMA 01/21/2023  8:43 AM EST     Attempted to call patientx 2.  VM box is full and cannot receive messages   Lalitha Ilyas Theodis Sato, [facility] 01/20/2023  8:44 AM EST     Attempted to call patient.  VM box is full and cannot receive messages.  Called alternate contact, patients daughter, Philis Fendt and informed to have patient call our office regarding results.   Glenford Bayley, NP 01/19/2023  1:07 PM EST     Please let patient know chest x-ray showed mild bronchial thickening suggestive of bronchoitis. No pneumonia.  Make sure she is using DuoNeb (combined ipratropium and albuterol) every 4-6 hours as needed.Continue Pulmicort (budesonide) nebulizer twice daily. Advise patient to take Mucinex twice daily to loosen congestion. Prescribe promethazine DM for cough suppression as needed at night.   Is her cough better over the weekend with above recommendations?    Above is result notes from cxr on 01/14/2023.  Per Beth:   Let her know ill send in abx and continue current regimen. Let us know if she is not better in 5-7 days   Called patient. VM is full and cannot leave message.

## 2023-01-26 NOTE — Telephone Encounter (Signed)
Attempted to call patient to review information.  Recording stating "mailbox is full and cannot receive messages".  Tried to call alternate contact person, Adylene Rando.  Number not in service.  MyChart not active since May 2024.

## 2023-02-17 ENCOUNTER — Other Ambulatory Visit: Payer: Self-pay

## 2023-02-17 ENCOUNTER — Telehealth: Payer: Self-pay | Admitting: Family Medicine

## 2023-02-17 ENCOUNTER — Encounter: Payer: Self-pay | Admitting: Acute Care

## 2023-02-17 ENCOUNTER — Other Ambulatory Visit: Payer: Self-pay | Admitting: Internal Medicine

## 2023-02-17 NOTE — Telephone Encounter (Signed)
 Pt requested refills of Gabapentin, Pramipexole, and Promethacine

## 2023-02-18 ENCOUNTER — Other Ambulatory Visit: Payer: Self-pay | Admitting: Nurse Practitioner

## 2023-02-18 MED ORDER — PROMETHAZINE HCL 25 MG PO TABS: 25.0000 mg | ORAL_TABLET | ORAL | 0 refills | Status: DC | PRN

## 2023-02-18 MED ORDER — PRAMIPEXOLE DIHYDROCHLORIDE 1 MG PO TABS: 1.0000 mg | ORAL_TABLET | Freq: Every day | ORAL | 1 refills | Status: DC

## 2023-02-18 MED ORDER — GABAPENTIN 300 MG PO CAPS: 300.0000 mg | ORAL_CAPSULE | Freq: Every day | ORAL | 1 refills | Status: DC

## 2023-02-18 NOTE — Telephone Encounter (Signed)
 After speaking, Dr. Ziglar decided to grant 1 month of these medications for Patient.

## 2023-02-28 ENCOUNTER — Emergency Department: Payer: 59

## 2023-02-28 ENCOUNTER — Other Ambulatory Visit: Payer: Self-pay

## 2023-02-28 ENCOUNTER — Emergency Department
Admission: EM | Admit: 2023-02-28 | Discharge: 2023-02-28 | Disposition: A | Discharge status: Home / Self Care | Payer: 59 | Attending: Emergency Medicine | Admitting: Emergency Medicine

## 2023-02-28 DIAGNOSIS — M25532 Pain in left wrist: Secondary | ICD-10-CM

## 2023-02-28 DIAGNOSIS — M1812 Unilateral primary osteoarthritis of first carpometacarpal joint, left hand: Secondary | ICD-10-CM

## 2023-02-28 DIAGNOSIS — I2510 Atherosclerotic heart disease of native coronary artery without angina pectoris: Secondary | ICD-10-CM | POA: Diagnosis not present

## 2023-02-28 DIAGNOSIS — J449 Chronic obstructive pulmonary disease, unspecified: Secondary | ICD-10-CM | POA: Diagnosis not present

## 2023-02-28 DIAGNOSIS — I10 Essential (primary) hypertension: Secondary | ICD-10-CM | POA: Diagnosis not present

## 2023-02-28 DIAGNOSIS — M19032 Primary osteoarthritis, left wrist: Secondary | ICD-10-CM | POA: Diagnosis not present

## 2023-02-28 HISTORY — DX: Atherosclerotic heart disease of native coronary artery without angina pectoris: I25.10

## 2023-02-28 HISTORY — DX: Acute myocardial infarction, unspecified: I21.9

## 2023-02-28 HISTORY — DX: Chronic obstructive pulmonary disease, unspecified: J44.9

## 2023-02-28 HISTORY — DX: Essential (primary) hypertension: I10

## 2023-02-28 HISTORY — DX: Abnormal levels of other serum enzymes: R74.8

## 2023-02-28 HISTORY — DX: Bronchitis, not specified as acute or chronic: J40

## 2023-02-28 MED ORDER — LIDOCAINE 5 % EX PTCH: 1.0000 | MEDICATED_PATCH | CUTANEOUS | 0 refills | Status: AC

## 2023-02-28 MED ORDER — LIDOCAINE 5 % EX PTCH: 1.0000 | MEDICATED_PATCH | CUTANEOUS | 0 refills | Status: DC

## 2023-02-28 NOTE — ED Provider Notes (Signed)
[facility] Provider Note    Event Date/Time   First MD Initiated Contact with Patient 02/28/23 1607     (approximate)   History   Wrist Pain   HPI  Kimberly Kemp is a 58 y.o. female  COPD, CAD, hypertension, presenting with left wrist and left first MCP pain for the last year.  States that things and gotten worse because she has a grandchild that she cares for who she carries.  She denies any new trauma or falls.  States that has not really taken anything for the pain.  She states that she has decreased ability to range her thumb and her radial side of her wrist due to her pain.  No swelling, redness, fevers.      Physical Exam   Triage Vital Signs: ED Triage Vitals  Encounter Vitals Group     BP 02/28/23 1450 123/86     Systolic BP Percentile --      Diastolic BP Percentile --      Pulse Rate 02/28/23 1450 71     Resp 02/28/23 1450 17     Temp 02/28/23 1450 97.8 F (36.6 C)     Temp Source 02/28/23 1450 Oral     SpO2 02/28/23 1450 100 %     Weight 02/28/23 1504 124 lb (56.2 kg)     Height 02/28/23 1504 5\' 3"  (1.6 m)     Head Circumference --      Peak Flow --      Pain Score 02/28/23 1458 9     Pain Loc --      Pain Education --      Exclude from Growth Chart --     Most recent vital signs: Vitals:   02/28/23 1450  BP: 123/86  Pulse: 71  Resp: 17  Temp: 97.8 F (36.6 C)  SpO2: 100%     General: Awake, no distress.  CV:  Good peripheral perfusion.  Resp:  Normal effort.  Abd:  No distention.  Other:  Intact radial pulses bilaterally, there is no discoloration, no erythema or swelling to her left wrist, able to passively range her left wrist and thumb.  Her grip strength is intact, sensation is intact.  She has some weakness with flexion against resistance to her left thumb but she says that that is due to to pain.  And is not new   ED Results / Procedures / Treatments   Labs (all labs ordered are listed, but only abnormal  results are displayed) Labs Reviewed - No data to display   RADIOLOGY X-ray interpretation, no obvious fractures   PROCEDURES:  Critical Care performed: No  Procedures   MEDICATIONS ORDERED IN ED: Medications - No data to display   IMPRESSION / MDM / ASSESSMENT AND PLAN / ED COURSE  I reviewed the triage vital signs and the nursing notes.                              Differential diagnosis includes, but is not limited to, arthritis, contusion, strain, considered but doubt fracture or dislocation will get an x-ray.  Patient's presentation is most consistent with acute complicated illness / injury requiring diagnostic workup.  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   She has decision-making with patient and she is, plan for outpatient management, will give her prescription for Lidoderm patches and she states that  she cannot take ibuprofen or Tylenol.  Also instructed her to get a thumb spica that is removable for her left wrist and thumb that she can use for comfort.  Instructed her to follow with primary care doctor, might need referral to hand surgery or orthopedic surgery for further management future.  Strict and precautions given.  Will discharge.      FINAL CLINICAL IMPRESSION(S) / ED DIAGNOSES   Final diagnoses:  Left wrist pain  Osteoarthritis of left thumb     Rx / DC Orders   ED Discharge Orders          Ordered    lidocaine (LIDODERM) 5 %  Every 24 hours,   Status:  Discontinued        02/28/23 1641    lidocaine (LIDODERM) 5 %  Every 24 hours        02/28/23 1642             Note:  This document was prepared using Dragon voice recognition software and may include unintentional dictation errors.    Claybon Jabs, MD 02/28/23 [phone]

## 2023-02-28 NOTE — ED Provider Triage Note (Signed)
Emergency Medicine Provider Triage Evaluation Note  Kimberly Kemp , a 58 y.o. female  was evaluated in triage.  Pt complains of left wrist pain x1 year, worsening. Worse with trying to open jars. No paresthesias/weakness. No injury. Right hand dominant.  Review of Systems  Positive: L wrist pain Negative: paresthesias/weakness  Physical Exam  BP 123/86 (BP Location: Right Arm)   Pulse 71   Temp 97.8 F (36.6 C) (Oral)   Resp 17   SpO2 100%  Gen:   Awake, no distress   Resp:  Normal effort  MSK:   Moves extremities without difficulty  Other:    Medical Decision Making  Medically screening exam initiated at 2:53 PM.  Appropriate orders placed.  Kimberly Kemp was informed that the remainder of the evaluation will be completed by another provider, this initial triage assessment does not replace that evaluation, and the importance of remaining in the ED until their evaluation is complete.     Kimberly Hoehn, PA-C 02/28/23 1454

## 2023-02-28 NOTE — Discharge Instructions (Addendum)
Please use the Lidoderm patch on your wrist.  You can also buy a removable thumb spica that you can put on your left arm for comfort and stability.  Please return if you have redness, swelling, fever, numbness, weakness to your wrist and hand, or if you have any additional concerns.  If your symptoms are persistent, you might need a referral to the hand surgeon or orthopedic surgery from your primary care doctor.

## 2023-02-28 NOTE — ED Triage Notes (Signed)
Pt to ED for L wrist pain since 1 year, worse since 2 months ago. States has "a knot" to wrist. Pt does have area that is slightly swollen and tender to touch on wrist above thumb. States moving thumb is painful. Saw PA in triage.  Chart is marked for merge. Had to update PMH.

## 2023-03-01 ENCOUNTER — Ambulatory Visit: Payer: Self-pay

## 2023-03-02 ENCOUNTER — Encounter: Payer: Self-pay | Admitting: Primary Care

## 2023-03-02 DIAGNOSIS — M654 Radial styloid tenosynovitis [de Quervain]: Secondary | ICD-10-CM | POA: Diagnosis not present

## 2023-03-03 ENCOUNTER — Ambulatory Visit: Payer: 59 | Admitting: Family Medicine

## 2023-03-13 ENCOUNTER — Other Ambulatory Visit: Payer: Self-pay

## 2023-03-13 ENCOUNTER — Emergency Department
Admission: EM | Admit: 2023-03-13 | Discharge: 2023-03-13 | Discharge status: Against Medical Advice | Payer: 59 | Attending: Emergency Medicine | Admitting: Emergency Medicine

## 2023-03-13 ENCOUNTER — Ambulatory Visit: Payer: 59 | Admitting: Internal Medicine

## 2023-03-13 DIAGNOSIS — J101 Influenza due to other identified influenza virus with other respiratory manifestations: Secondary | ICD-10-CM | POA: Diagnosis not present

## 2023-03-13 DIAGNOSIS — Z20822 Contact with and (suspected) exposure to covid-19: Secondary | ICD-10-CM | POA: Insufficient documentation

## 2023-03-13 DIAGNOSIS — Z5321 Procedure and treatment not carried out due to patient leaving prior to being seen by health care provider: Secondary | ICD-10-CM | POA: Insufficient documentation

## 2023-03-13 DIAGNOSIS — J029 Acute pharyngitis, unspecified: Secondary | ICD-10-CM | POA: Diagnosis not present

## 2023-03-13 DIAGNOSIS — R42 Dizziness and giddiness: Secondary | ICD-10-CM | POA: Diagnosis not present

## 2023-03-13 LAB — CBC
HCT: 42.9 % (ref 36.0–46.0)
Hemoglobin: 14.5 g/dL (ref 12.0–15.0)
MCH: 30.5 pg (ref 26.0–34.0)
MCHC: 33.8 g/dL (ref 30.0–36.0)
MCV: 90.3 fL (ref 80.0–100.0)
Platelets: 169 10*3/uL (ref 150–400)
RBC: 4.75 MIL/uL (ref 3.87–5.11)
RDW: 14.7 % (ref 11.5–15.5)
WBC: 8.7 10*3/uL (ref 4.0–10.5)
nRBC: 0 % (ref 0.0–0.2)

## 2023-03-13 LAB — BASIC METABOLIC PANEL
Anion gap: 12 (ref 5–15)
BUN: 22 mg/dL — ABNORMAL HIGH (ref 6–20)
CO2: 24 mmol/L (ref 22–32)
Calcium: 9.3 mg/dL (ref 8.9–10.3)
Chloride: 102 mmol/L (ref 98–111)
Creatinine, Ser: 1.2 mg/dL — ABNORMAL HIGH (ref 0.44–1.00)
GFR, Estimated: 53 mL/min — ABNORMAL LOW (ref >60)
Glucose, Bld: 103 mg/dL — ABNORMAL HIGH (ref 70–99)
Potassium: 3.5 mmol/L (ref 3.5–5.1)
Sodium: 138 mmol/L (ref 135–145)

## 2023-03-13 LAB — RESP PANEL BY RT-PCR (RSV, FLU A&B, COVID)  RVPGX2
Influenza A by PCR: POSITIVE — AB
Influenza B by PCR: NEGATIVE
Resp Syncytial Virus by PCR: NEGATIVE
SARS Coronavirus 2 by RT PCR: NEGATIVE

## 2023-03-13 LAB — GROUP A STREP BY PCR: Group A Strep by PCR: NOT DETECTED

## 2023-03-13 NOTE — ED Provider Triage Note (Signed)
 Emergency Medicine Provider Triage Evaluation Note  Kimberly Kemp , a 58 y.o. female  was evaluated in triage.  Pt complains of cough and sore throat. Grandchildren at home were dx with strep throat and influenza. Also notes 2 episodes of near syncopal episodes on Wednesday.   Review of Systems  Positive: nausea Negative:   Physical Exam  BP 129/80   Pulse 71   Temp 98.5 F (36.9 C) (Oral)   Resp 20   LMP 07/18/2016   SpO2 100%  Gen:   Awake, no distress   Resp:  Normal effort  MSK:   Moves extremities without difficulty  Other:    Medical Decision Making  Medically screening exam initiated at 2:52 PM.  Appropriate orders placed.  [name] was informed that the remainder of the evaluation will be completed by another provider, this initial triage assessment does not replace that evaluation, and the importance of remaining in the ED until their evaluation is complete.     Margrette, Artesia Berkey A, PA-C 03/13/23 1456

## 2023-03-13 NOTE — ED Triage Notes (Signed)
 Pt to ED via POV from home. Pt reports cough, sore throat, CP w/ cough, and 2 near syncopal episodes. Pt reports family tested positive for Flu and strep

## 2023-03-16 ENCOUNTER — Telehealth: Payer: Self-pay

## 2023-03-16 ENCOUNTER — Ambulatory Visit: Payer: Self-pay | Admitting: Internal Medicine

## 2023-03-16 NOTE — Telephone Encounter (Signed)
  Chief Complaint: Triage, no contac  Disposition: [] ED /[] Urgent Care (no appt availability in office) / [] Appointment(In office/virtual)/ []  [place] [facility]/ [] Home Care/ [] Refused Recommended Disposition /[] [place] [facility]/ [x]  Follow-up with PCP Additional Notes: Patient disconnected call before transfer to triage, declining triage per agent. Alerting PCP.   Copied from CRM [id number]. Topic: Clinical - Red Word Triage >> Mar 16, 2023  1:18 PM Lizabeth Riggs wrote: Red Word that prompted transfer to Nurse Triage: Cough, sore throat, headache, nausea, Pain level at a 10 She has been to urgent care and ED Reason for Disposition  Caller has cancelled the call before the first contact  Answer Assessment - Initial Assessment Questions N/A Patient to be transferred from agent to this RN for triage. Patient refused triage, disconnected call before transfer. Alerting PCP for review.  Protocols used: No Contact or Duplicate Contact Call-A-AH

## 2023-03-16 NOTE — Telephone Encounter (Signed)
 Called patient to advise her that she needs to be seen in the ED or Urgent Care per Dr. Ziglar. Patient left AMA on 03/13/23 and stated she was not going to return to either as advised. Patient was advised that was the advice per Dr. Ziglar at this time. Patient disconnected call.    Copied from CRM [id number]. Topic: Clinical - Medication Question >> Mar 16, 2023 12:19 PM Yolanda T wrote: Reason for CRM: patient called stated she was swabbed on 2/7 at the ED but she was not seen. Patient is having symptoms of possible strep or flu and is requesting medication. Patient has a new patient appt set up for Monday February 17th

## 2023-03-18 ENCOUNTER — Ambulatory Visit: Payer: Self-pay | Admitting: Internal Medicine

## 2023-03-18 ENCOUNTER — Telehealth: Payer: Self-pay | Admitting: Primary Care

## 2023-03-18 NOTE — Telephone Encounter (Signed)
Dr. Jayme Cloud, this patient has been scheduled to see Dr. Belia Heman for an Acute visit tomorrow. Do you feel like she should be seen in the office or is there something else we can try?

## 2023-03-18 NOTE — Telephone Encounter (Signed)
Schedule acute visit with Dr. Belia Heman tomorrow 2/13 at 1:30pm

## 2023-03-18 NOTE — Telephone Encounter (Signed)
She was a patient of Dr. Evlyn Courier that I saw as an acute/ED visit follow-up only 1 time.  Given that that visit was in 2023 and that none of [place] really know her well she should be seen as an acute visit.

## 2023-03-18 NOTE — Telephone Encounter (Signed)
PT calling for antibx. States she is not feeling well. Tsf to nurse.

## 2023-03-18 NOTE — Telephone Encounter (Signed)
Chief Complaint: cough Symptoms: productive cough, neg home COVID Frequency: 3 weeks ago Pertinent Negatives: Patient denies fever, chest pain, SOB Disposition: [] ED /[] Urgent Care (no appt availability in office) / [x] Appointment(In office/virtual)/ []  [facility]/ [] Home Care/ [] Refused Recommended Disposition /[] [place] [facility]/ []  Follow-up with PCP Additional Notes: Patient c/o productive cough x 3 weeks. Reports that she has done home COVID test that was negative 2.5 weeks ago, also reports that grandbaby has strep. Denies SOB, chest pain. Endorses she is taking all respiratory treatments as prescribed as well as Robitussin DM with no relief.  Pulmonology PAA Fredric Mare notified of disposition, patient transferred for scheduling. Patient verbalized understanding.    Reason for Disposition  [1] Known COPD or other severe lung disease (i.e., bronchiectasis, cystic fibrosis, lung surgery) AND [2] worsening symptoms (i.e., increased sputum purulence or amount, increased breathing difficulty  Answer Assessment - Initial Assessment Questions "Chief Complaint (e.g., cough, sob, wheezing, fever, chills, sweat or additional symptoms) *Go to specific symptom protocol after initial questions. cough "How long have symptoms been present?" 3 weeks  Have you tested for COVID or Flu? Note: If not, ask patient if a home test can be taken. If so, instruct patient to call back for positive results. Yes, COVID neg (home test taken 2.5 weeks ago) [place] with + Strep  MEDICINES:   "Have you used any OTC meds to help with symptoms?" Yes If yes, ask "What medications?" Robitussin DM with no relief "Have you used your inhalers/maintenance medication?" Yes If yes, "What medications?"  If inhaler, ask "How many puffs and how often?" Note: Review instructions on medication in the chart. Albuterol q4 hours - last taken @ 12 noon Pulicort & atrovent - takes as prescribed  OXYGEN: "Do  you wear supplemental oxygen?" No  "Do you monitor your oxygen levels?" No  "What is your usual oxygen saturation reading?"  (Note: Pulmonary O2 sats should be 90% or greater) no    1. ONSET: "When did the cough begin?"      3 weeks 2. SEVERITY: "How bad is the cough today?"      Bad at night 3. SPUTUM: "Describe the color of your sputum" (none, dry cough; clear, white, yellow, green)     Yellow/green 4. HEMOPTYSIS: "Are you coughing up any blood?" If so ask: "How much?" (flecks, streaks, tablespoons, etc.)     no 5. DIFFICULTY BREATHING: "Are you having difficulty breathing?" If Yes, ask: "How bad is it?" (e.g., mild, moderate, severe)    - MILD: No SOB at rest, mild SOB with walking, speaks normally in sentences, can lie down, no retractions, pulse < 100.    - MODERATE: SOB at rest, SOB with minimal exertion and prefers to sit, cannot lie down flat, speaks in phrases, mild retractions, audible wheezing, pulse 100-120.    - SEVERE: Very SOB at rest, speaks in single words, struggling to breathe, sitting hunched forward, retractions, pulse > 120      SOB during night 6. FEVER: "Do you have a fever?" If Yes, ask: "What is your temperature, how was it measured, and when did it start?"     no 7. CARDIAC HISTORY: "Do you have any history of heart disease?" (e.g., heart attack, congestive heart failure)      Yes, CAD 8. LUNG HISTORY: "Do you have any history of lung disease?"  (e.g., pulmonary embolus, asthma, emphysema)     Emphysema, COPD 9. PE RISK FACTORS: "Do you have a history of blood clots?" (  or: recent major surgery, recent prolonged travel, bedridden)     yes 10. OTHER SYMPTOMS: "Do you have any other symptoms?" (e.g., runny nose, wheezing, chest pain)       no  Protocols used: Cough - Acute Productive-A-AH

## 2023-03-19 ENCOUNTER — Ambulatory Visit: Payer: 59 | Admitting: Internal Medicine

## 2023-03-19 NOTE — Telephone Encounter (Signed)
Patient was a no-show for her appointment.

## 2023-03-19 NOTE — Progress Notes (Deleted)
 [facility]    Past Medical History:  CVA, PE, PAF, HTN, HLD, Hep B, GERD, Depression, Cirrhosis, Colon cancer, chronic pain, Anxiety, CAD s/p stent  Past Surgical History:  Her  has a past surgical history that includes Bronchoscopy (March 2008, Feb 2012); Appendectomy; RIGHT/LEFT HEART CATH AND CORONARY ANGIOGRAPHY (N/A, 02/22/2019); CORONARY PRESSURE/FFR STUDY (N/A, 02/22/2019); CORONARY STENT INTERVENTION (N/A, 02/22/2019); Cardiac catheterization; Appendectomy; and Cesarean section.  Brief Summary:  Kimberly Kemp is a 58 y.o. 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     CC Follow up assessment of COPD  Subjective:   She was seen in ER on 05/26/21 with increased dyspnea, cough, and wheeze.  Also had nasal congestion.  Treated with prednisone and nebulizer therapy.  Chest xray showed hyperinflation.  CBC was normal.    She continues to have cough with yellow, thick sputum.  Still gets intermittent wheeze.  No fever.  Sinus okay.  Has burning feeling in her mouth and when she swallows.  She is down to 5 cigarettes per day.  Physical Exam:        Review of Systems: Gen:  Denies  fever, sweats, chills weight loss  HEENT: Denies blurred vision, double vision, ear pain, eye pain, hearing loss, nose bleeds, sore throat Cardiac:  No dizziness, chest pain or heaviness, chest tightness,edema, No JVD Resp:   No cough, -sputum production, -shortness of breath,-wheezing, -hemoptysis,  Other:  All other systems negative   Physical Examination:   General Appearance: No distress  EYES PERRLA, EOM intact.   NECK Supple, No JVD Pulmonary: normal breath sounds, No wheezing.  CardiovascularNormal S1,S2.  No m/r/g.   Abdomen: Benign, Soft, non-tender. Neurology UE/LE 5/5 strength, no focal deficits Ext pulses intact, cap refill intact ALL OTHER ROS ARE NEGATIVE   Pulmonary testing:  A1AT 09/04/06 >> 236 Spirometry 10/26/09>>FEV1  2.59(88%), FEV1% 73 RAST 01/22/10 >> negative, IgE 11.9 Labs 01/22/10 >> ACE 46, RF < 20, ANA negative, ANCA negative PFT 07/14/18 >> FEV1 2.08 (77%), FEV1% 65, TLC 4.11 (83%), DLCO 70%  Chest Imaging:  CT chest 06/26/10 >> upper lobe emphysema CT chest 06/10/13 >> minimal centrilobular emphysema CT chest 07/13/18 >> moderate to advanced emphysema LDCT chest 05/28/20 >> moderate centrilobular and paraseptal emphysema  Sleep Tests:  HST 09/13/10 >> AHI 1, SpO2 low 91%  Cardiac Tests:  Echo 04/15/06 >> EF 55 to 65% LHC 02/22/19 >> RCA 70% occlusion, RCA dissection, DES Echo 02/23/19 >> EF 55 to 60%  Social History:  She  reports that she has been smoking cigarettes. She started smoking about 50 years ago. She has a 25 pack-year smoking history. She has never used smokeless tobacco. She reports that she does not currently use alcohol. She reports that she does not use drugs.  Family History:  Her family history includes Diabetes in her mother; Emphysema in her father and maternal grandmother; Heart disease (age of onset: 51) in her mother; Liver disease in her father; Prostate cancer in her father.     Assessment/Plan:   COPD with emphysema. - she has persistent exacerbation - don't think she needs additional prednisone - will give her course of levaquin - continue pulmicort, albuterol, ipratropium in nebulizer - continue singulair - prn hycodan for cough during exacerbations  Thrush. - will give course of nystatin - emphasized importance of rinsing her mouth thoroughly after using pulmicort  Tobacco abuse. - she will gradually try to cut down on smoking; encouraged her  to keep up with her progress - she has low dose CT chest scheduled for later this month  CAD s/p DES. - followed by Dr. Cristal Deer End with [facility]    Medication List:   Allergies as of 03/19/2023       Reactions   Penicillins Hives   Did it involve swelling of the face/tongue/throat, SOB, or low  BP? No Did it involve sudden or severe rash/hives, skin peeling, or any reaction on the inside of your mouth or nose? Yes Did you need to seek medical attention at a hospital or doctor's office? Yes When did it last happen?      10 Years If all above answers are "NO", may proceed with cephalosporin use.   Penicillins Hives   Omnipaque [iohexol] Itching      Red Dye Hives, Itching   Red Dye #40 (allura Red) Itching        Medication List        Accurate as of March 19, 2023  1:31 PM. If you have any questions, ask your nurse or doctor.          acetaminophen-codeine 300-30 MG tablet Commonly known as: TYLENOL #3 Take 1-2 tablets by mouth every 4 (four) hours as needed for moderate pain.   albuterol 108 (90 Base) MCG/ACT inhaler Commonly known as: VENTOLIN HFA Inhale 2 puffs into the lungs every 6 (six) hours as needed for wheezing or shortness of breath.   aspirin EC 81 MG tablet Take 81 mg by mouth daily.   azithromycin 250 MG tablet Commonly known as: ZITHROMAX Per ZPACK instructions   budesonide 0.25 MG/2ML nebulizer solution Commonly known as: PULMICORT Take 2 mLs (0.25 mg total) by nebulization in the morning and at bedtime.   butalbital-acetaminophen-caffeine 50-325-40 MG tablet Commonly known as: FIORICET Take 1 tablet by mouth 3 (three) times daily as needed for headache.   cetirizine 10 MG tablet Commonly known as: ZYRTEC Take 10 mg by mouth daily.   clonazePAM 0.5 MG tablet Commonly known as: KLONOPIN Take 0.5 mg by mouth 2 (two) times daily.   diltiazem 240 MG 24 hr capsule Commonly known as: CARDIZEM CD TAKE 1 CAPSULE BY MOUTH EVERY DAY   ezetimibe 10 MG tablet Commonly known as: ZETIA Take 1 tablet (10 mg total) by mouth daily.   famotidine 40 MG tablet Commonly known as: PEPCID Take 40 mg by mouth at bedtime.   FISH OIL PO Take 1 capsule by mouth 2 (two) times daily.   gabapentin 300 MG capsule Commonly known as: NEURONTIN Take 1  capsule (300 mg total) by mouth at bedtime.   ipratropium-albuterol 0.5-2.5 (3) MG/3ML Soln Commonly known as: DUONEB Take 3 mLs by nebulization every 6 (six) hours as needed.   lidocaine 5 % Commonly known as: Lidoderm Place 1 patch onto the skin daily. Remove & Discard patch within 12 hours or as directed by MD   magic mouthwash w/lidocaine Soln Take 10 mLs by mouth 4 (four) times daily as needed for mouth pain.   metoprolol tartrate 50 MG tablet Commonly known as: LOPRESSOR Take 1 tablet (50 mg total) by mouth 2 (two) times daily.   montelukast 10 MG tablet Commonly known as: SINGULAIR Take 1 tablet (10 mg total) by mouth at bedtime.   nitroGLYCERIN 0.4 MG SL tablet Commonly known as: NITROSTAT PLACE 1 TABLET UNDER THE TONGUE EVERY 5 (FIVE) MINUTES AS NEEDED FOR CHEST PAIN. MAXIMUM OF 3 DOSES.   nystatin 100000 UNIT/ML suspension  Commonly known as: MYCOSTATIN Take 5 mLs (500,000 Units total) by mouth 4 (four) times daily.   omeprazole 40 MG capsule Commonly known as: PRILOSEC TAKE 1 CAPSULE BY MOUTH EVERY DAY   oxyCODONE-acetaminophen 5-325 MG tablet Commonly known as: Percocet Take 1 tablet by mouth every 4 (four) hours as needed for severe pain.   Pazeo 0.7 % Soln Generic drug: Olopatadine HCl Place 1 drop into both eyes daily.   pramipexole 1 MG tablet Commonly known as: MIRAPEX Take 1 tablet (1 mg total) by mouth at bedtime.   promethazine 25 MG tablet Commonly known as: PHENERGAN Take 1 tablet (25 mg total) by mouth every 4 (four) hours as needed.   promethazine-dextromethorphan 6.25-15 MG/5ML syrup Commonly known as: PROMETHAZINE-DM Take 5 mLs by mouth 4 (four) times daily as needed for cough.   rosuvastatin 40 MG tablet Commonly known as: CRESTOR TAKE 1 TABLET BY MOUTH EVERY DAY   Tiadylt ER 180 MG 24 hr capsule Generic drug: diltiazem Take 180 mg by mouth daily.         MEDICATION ADJUSTMENTS/LABS AND TESTS ORDERED:    CURRENT  MEDICATIONS REVIEWED AT LENGTH WITH PATIENT TODAY   Patient  satisfied with Plan of action and management. All questions answered   Follow up    I spent a total of **** minutes reviewing chart data, face-to-face evaluation with the patient, counseling and coordination of care as detailed above.      Lucie Leather, M.D.  [facility]  Medical Director [facility] Medical Director [facility] Cardio-Pulmonary Department

## 2023-03-23 ENCOUNTER — Ambulatory Visit (INDEPENDENT_AMBULATORY_CARE_PROVIDER_SITE_OTHER): Payer: 59 | Admitting: Family Medicine

## 2023-03-23 ENCOUNTER — Encounter: Payer: Self-pay | Admitting: Family Medicine

## 2023-03-23 VITALS — BP 105/69 | HR 79 | Temp 97.5°F | Resp 14 | Ht 63.0 in | Wt 126.6 lb

## 2023-03-23 DIAGNOSIS — R748 Abnormal levels of other serum enzymes: Secondary | ICD-10-CM | POA: Diagnosis not present

## 2023-03-23 DIAGNOSIS — Z1211 Encounter for screening for malignant neoplasm of colon: Secondary | ICD-10-CM | POA: Diagnosis not present

## 2023-03-23 DIAGNOSIS — F419 Anxiety disorder, unspecified: Secondary | ICD-10-CM | POA: Diagnosis not present

## 2023-03-23 DIAGNOSIS — I10 Essential (primary) hypertension: Secondary | ICD-10-CM | POA: Diagnosis not present

## 2023-03-23 DIAGNOSIS — Z1231 Encounter for screening mammogram for malignant neoplasm of breast: Secondary | ICD-10-CM | POA: Diagnosis not present

## 2023-03-23 DIAGNOSIS — J441 Chronic obstructive pulmonary disease with (acute) exacerbation: Secondary | ICD-10-CM

## 2023-03-23 MED ORDER — PREDNISONE 10 MG (48) PO TBPK: ORAL_TABLET | Freq: Every day | ORAL | 0 refills | Status: DC

## 2023-03-23 MED ORDER — DOXYCYCLINE HYCLATE 100 MG PO CAPS: 100.0000 mg | ORAL_CAPSULE | Freq: Two times a day (BID) | ORAL | 0 refills | Status: DC

## 2023-03-23 MED ORDER — CLONAZEPAM 0.5 MG PO TABS: 0.5000 mg | ORAL_TABLET | Freq: Two times a day (BID) | ORAL | 0 refills | Status: DC | PRN

## 2023-03-23 NOTE — Assessment & Plan Note (Addendum)
 She just finished a prednisone dosepack, 10mg  over 12 days for her De quervain tenosynovitis.    Prednisone 10mg  12 day dosepack and doxycycline 100mg  BID for 10 days

## 2023-03-23 NOTE — Progress Notes (Signed)
 Established Patient Office Visit  Subjective   Patient ID: Kimberly Kemp, female    DOB: 1965-06-30  Age: 58 y.o. MRN: [id number]  Chief Complaint  Patient presents with   Medical Management of Chronic Issues    HPI [name] 57-yo with COPD/emphysema, (follows with Pulmonology), mixed hyperlipidemia, CAD (PCI to the RCA with iatrogenic dissection of the RCA 02/22/2019,  Sees Dr. Okey Dupre), HTN, GAD, PVD (subclavian artery), GERD and De Quervain's tenosynovitis left. She reports she has been sick for 2 weeks.  Her grandchildren had the flu and strep.  She reports first week she had fever and vomiting and the second week she had a bad headache, sore throat, coughing up yellow-green mucus., throat is very sore.  Feels short of breath when she lies down at night and coughs more at night.  Her ears are stopped up and popping.   She is on Pulmicort 0.25 mg by nebulizer twice daily and albuterol and ipratropium via neb every 4 to 6 hours.  She reports good compliance with her nebulized medications but notes they are currently in adequate.  She also has albuterol that she can take by nebulizer.  She is still smoking less than 10 cigarettes a day.  She is asking for refill of clonazepam she takes this at night to help her rest. She reports she has had elevated liver enzymes on her last blood test with cardiology.  She was also told to have her renal function rechecked but she has not done that    ROS    Objective:     BP 105/69 (BP Location: Right Arm, Patient Position: Sitting, Cuff Size: Normal)   Pulse 79   Temp (!) 97.5 F (36.4 C) (Oral)   Resp 14   Ht 5\' 3"  (1.6 m) Comment: per patient  Wt 126 lb 9.6 oz (57.4 kg)   LMP 07/18/2016   SpO2 97%   BMI 22.43 kg/m    Physical Exam Vitals and nursing note reviewed.  Constitutional:      Appearance: Normal appearance.  HENT:     Head: Normocephalic and atraumatic.     Right Ear: Tympanic membrane, ear canal and external ear normal.      Left Ear: Tympanic membrane, ear canal and external ear normal.     Mouth/Throat:     Mouth: Mucous membranes are moist.     Pharynx: No oropharyngeal exudate or posterior oropharyngeal erythema.  Eyes:     Conjunctiva/sclera: Conjunctivae normal.  Cardiovascular:     Rate and Rhythm: Normal rate and regular rhythm.  Pulmonary:     Effort: Pulmonary effort is normal.     Breath sounds: Wheezing (wheezing scattered throughout) present.  Musculoskeletal:     Right lower leg: No edema.     Left lower leg: No edema.  Skin:    General: Skin is warm and dry.  Neurological:     Mental Status: She is alert and oriented to person, place, and time.  Psychiatric:        Mood and Affect: Mood normal.        Behavior: Behavior normal.        Thought Content: Thought content normal.        Judgment: Judgment normal.      No results found for any visits on 03/23/23.    The ASCVD Risk score (Arnett DK, et al., 2019) failed to calculate for the following reasons:   Risk score cannot be calculated because patient has a  medical history suggesting prior/existing ASCVD    Assessment & Plan:   Problem List Items Addressed This Visit       Respiratory   COPD with acute exacerbation (HCC)   She just finished a prednisone dosepack, 10mg  over 12 days for her De quervain tenosynovitis.    Prednisone 10mg  12 day dosepack and doxycycline 100mg  BID for 10 days      Relevant Medications   montelukast (SINGULAIR) 10 MG tablet   predniSONE (STERAPRED UNI-PAK 48 TAB) 10 MG (48) TBPK tablet   doxycycline (VIBRAMYCIN) 100 MG capsule   Other Visit Diagnoses       Encounter for screening mammogram for malignant neoplasm of breast    -  Primary   Relevant Orders   MM 3D SCREENING MAMMOGRAM BILATERAL BREAST     Screening for colon cancer       Relevant Orders   Ambulatory referral to Gastroenterology     Elevated liver enzymes       Relevant Orders   CMP14+EGFR     Primary hypertension        Relevant Orders   CMP14+EGFR     Anxiety       Relevant Medications   clonazePAM (KLONOPIN) 0.5 MG tablet       No follow-ups on file.    Alease Medina, MD

## 2023-03-24 LAB — CMP14+EGFR
ALT: 23 [IU]/L (ref 0–32)
AST: 24 [IU]/L (ref 0–40)
Albumin: 4 g/dL (ref 3.8–4.9)
Alkaline Phosphatase: 97 [IU]/L (ref 44–121)
BUN/Creatinine Ratio: 17 (ref 9–23)
BUN: 16 mg/dL (ref 6–24)
Bilirubin Total: <0.2 mg/dL (ref 0.0–1.2)
CO2: 20 mmol/L (ref 20–29)
Calcium: 9.5 mg/dL (ref 8.7–10.2)
Chloride: 108 mmol/L — ABNORMAL HIGH (ref 96–106)
Creatinine, Ser: 0.94 mg/dL (ref 0.57–1.00)
Globulin, Total: 2.5 g/dL (ref 1.5–4.5)
Glucose: 91 mg/dL (ref 70–99)
Potassium: 4.2 mmol/L (ref 3.5–5.2)
Sodium: 142 mmol/L (ref 134–144)
Total Protein: 6.5 g/dL (ref 6.0–8.5)
eGFR: 71 mL/min/{1.73_m2} (ref >59)

## 2023-04-07 ENCOUNTER — Encounter: Payer: Self-pay | Admitting: *Deleted

## 2023-04-13 ENCOUNTER — Telehealth: Payer: Self-pay | Admitting: *Deleted

## 2023-04-13 ENCOUNTER — Other Ambulatory Visit: Payer: Self-pay | Admitting: Family Medicine

## 2023-04-13 ENCOUNTER — Ambulatory Visit: Payer: Self-pay | Admitting: Family Medicine

## 2023-04-13 ENCOUNTER — Other Ambulatory Visit: Payer: Self-pay | Admitting: *Deleted

## 2023-04-13 DIAGNOSIS — I10 Essential (primary) hypertension: Secondary | ICD-10-CM

## 2023-04-13 DIAGNOSIS — Z1211 Encounter for screening for malignant neoplasm of colon: Secondary | ICD-10-CM

## 2023-04-13 DIAGNOSIS — E7801 Familial hypercholesterolemia: Secondary | ICD-10-CM

## 2023-04-13 MED ORDER — DILTIAZEM HCL ER COATED BEADS 240 MG PO CP24: 240.0000 mg | ORAL_CAPSULE | Freq: Every day | ORAL | 0 refills | Status: DC

## 2023-04-13 MED ORDER — EZETIMIBE 10 MG PO TABS: 10.0000 mg | ORAL_TABLET | Freq: Every day | ORAL | 0 refills | Status: DC

## 2023-04-13 MED ORDER — MONTELUKAST SODIUM 10 MG PO TABS: 10.0000 mg | ORAL_TABLET | Freq: Every day | ORAL | 0 refills | Status: DC

## 2023-04-13 MED ORDER — MONTELUKAST SODIUM 10 MG PO TABS: 10.0000 mg | ORAL_TABLET | Freq: Every day | ORAL | 0 refills | Status: AC

## 2023-04-13 MED ORDER — NA SULFATE-K SULFATE-MG SULF 17.5-3.13-1.6 GM/177ML PO SOLN: 1.0000 | Freq: Once | ORAL | 0 refills | Status: AC

## 2023-04-13 NOTE — Addendum Note (Signed)
 Addended by: Tonny Bollman on: 04/13/2023 04:51 PM   Modules accepted: Orders

## 2023-04-13 NOTE — Telephone Encounter (Signed)
 Copied from CRM [id number]. Topic: Clinical - Medication Refill >> Apr 13, 2023  2:24 PM Izetta Dakin wrote: Most Recent Primary Care Visit:  Provider: Alease Medina  Department: [facility]  Visit Type: NEW PT - OFFICE VISIT  Date: 03/23/2023  Medication: ezetimibe (ZETIA) 10 MG tablet diltiazem (CARDIZEM CD) 240 MG 24 hr capsule montelukast (SINGULAIR) 10 MG tablet   Has the patient contacted their pharmacy? Yes (Agent: If no, request that the patient contact the pharmacy for the refill. If patient does not wish to contact the pharmacy document the reason why and proceed with request.) (Agent: If yes, when and what did the pharmacy advise?)  Is this the correct pharmacy for this prescription? Yes If no, delete pharmacy and type the correct one.  This is the patient's preferred pharmacy:  [facility] [phone] [facility] [place] - [address] [place] [address] Phone: [phone] Fax: [phone]    Has the prescription been filled recently? No  Is the patient out of the medication? Yes  Has the patient been seen for an appointment in the last year OR does the patient have an upcoming appointment? Yes  Can we respond through MyChart? No  Agent: Please be advised that Rx refills may take up to 3 business days. We ask that you follow-up with your pharmacy.

## 2023-04-13 NOTE — Telephone Encounter (Signed)
  Chief Complaint: dizziness Symptoms: dizziness, irregular heart rate- continues, eye/vision changes Frequency: ongoing since fainting episode 03/18/23  Disposition: [x] ED /[] Urgent Care (no appt availability in office) / [] Appointment(In office/virtual)/ []  [facility]/ [] Home Care/ [] Refused Recommended Disposition /[] [place] [facility]/ []  Follow-up with PCP Additional Notes: Patient states she has irregular heart rate, vision changes, pain in chest with eating, vomiting - advised ED per protocol  Copied from CRM [id number]. Topic: Clinical - Red Word Triage >> Apr 13, 2023  2:33 PM Abundio Miu S wrote: [facility] that prompted transfer to Nurse Triage: Dizziness, Blurred vision in Lt eye, nausea Reason for Disposition  Extra heartbeats, irregular heart beating, or heart is beating very fast  (i.e., "palpitations")  Answer Assessment - Initial Assessment Questions 1. DESCRIPTION: "Describe your dizziness."    Still staying dizzy, eye keeps getting blurry 2. LIGHTHEADED: "Do you feel lightheaded?" (e.g., somewhat faint, woozy, weak upon standing)     Patient has been taking easy as to get faint 3. VERTIGO: "Do you feel like either you or the room is spinning or tilting?" (i.e. vertigo)     na 4. SEVERITY: "How bad is it?"  "Do you feel like you are going to faint?" "Can you stand and walk?"   - MILD: Feels slightly dizzy, but walking normally.   - MODERATE: Feels unsteady when walking, but not falling; interferes with normal activities (e.g., school, work).   - SEVERE: Unable to walk without falling, or requires assistance to walk without falling; feels like passing out now.      mild 5. ONSET:  "When did the dizziness begin?"     2 weeks ago 6. AGGRAVATING FACTORS: "Does anything make it worse?" (e.g., standing, change in head position)     Turning head-left eye will not focus 7. HEART RATE: "Can you tell me your heart rate?" "How many beats in 15 seconds?"  (Note: not all  patients can do this)       Does not check- but feels racing, skipping 8. CAUSE: "What do you think is causing the dizziness?"     Cardiac reason 9. RECURRENT SYMPTOM: "Have you had dizziness before?" If Yes, ask: "When was the last time?" "What happened that time?"     no 10. OTHER SYMPTOMS: "Do you have any other symptoms?" (e.g., fever, chest pain, vomiting, diarrhea, bleeding)       No- had vomiting 24 hours- but that has stopped  Protocols used: Dizziness - Lightheadedness-A-AH

## 2023-04-13 NOTE — Telephone Encounter (Signed)
 Gastroenterology Pre-Procedure Review  Request Date: 05/20/2023 Requesting Physician: Dr. Allegra Lai  PATIENT REVIEW QUESTIONS: The patient responded to the following health history questions as indicated:    1. Are you having any GI issues? no 2. Do you have a personal history of Polyps?  Not sure but colonoscopy was more that 7-10 years ago 3. Do you have a family history of Colon Cancer or Polyps? no 4. Diabetes Mellitus? no 5. Joint replacements in the past 12 months?no 6. Major health problems in the past 3 months?no 7. Any artificial heart valves, MVP, or defibrillator?yes (stents)    MEDICATIONS & ALLERGIES:    Patient reports the following regarding taking any anticoagulation/antiplatelet therapy:   Plavix, Coumadin, Eliquis, Xarelto, Lovenox, Pradaxa, Brilinta, or Effient? no Aspirin? yes (81 mg)  Patient confirms/reports the following medications:  Current Outpatient Medications  Medication Sig Dispense Refill   Na Sulfate-K Sulfate-Mg Sulfate concentrate (SUPREP) 17.5-3.13-1.6 GM/177ML SOLN Take 1 kit (354 mLs total) by mouth once for 1 dose. 354 mL 0   acetaminophen-codeine (TYLENOL #3) 300-30 MG tablet Take 1-2 tablets by mouth every 4 (four) hours as needed for moderate pain. 30 tablet 0   albuterol (VENTOLIN HFA) 108 (90 Base) MCG/ACT inhaler Inhale 2 puffs into the lungs every 6 (six) hours as needed for wheezing or shortness of breath. 8 g 2   aspirin EC 81 MG tablet Take 81 mg by mouth daily.     azithromycin (ZITHROMAX) 250 MG tablet Per ZPACK instructions (Patient not taking: Reported on 03/23/2023) 6 tablet 0   budesonide (PULMICORT) 0.25 MG/2ML nebulizer solution Take 2 mLs (0.25 mg total) by nebulization in the morning and at bedtime. 360 mL 5   butalbital-acetaminophen-caffeine (FIORICET, ESGIC) 50-325-40 MG tablet Take 1 tablet by mouth 3 (three) times daily as needed for headache.  2   cetirizine (ZYRTEC) 10 MG tablet Take 10 mg by mouth daily.     clonazePAM  (KLONOPIN) 0.5 MG tablet Take 1 tablet (0.5 mg total) by mouth 2 (two) times daily as needed for anxiety. 30 tablet 0   diltiazem (CARDIZEM CD) 240 MG 24 hr capsule TAKE 1 CAPSULE BY MOUTH EVERY DAY (Patient not taking: Reported on 03/23/2023) 90 capsule 3   doxycycline (VIBRAMYCIN) 100 MG capsule Take 1 capsule (100 mg total) by mouth 2 (two) times daily. 20 capsule 0   ezetimibe (ZETIA) 10 MG tablet Take 1 tablet (10 mg total) by mouth daily. 90 tablet 3   famotidine (PEPCID) 40 MG tablet Take 40 mg by mouth at bedtime.     gabapentin (NEURONTIN) 300 MG capsule Take 1 capsule (300 mg total) by mouth at bedtime. 30 capsule 1   ipratropium-albuterol (DUONEB) 0.5-2.5 (3) MG/3ML SOLN Take 3 mLs by nebulization every 6 (six) hours as needed. 360 mL 5   lidocaine (LIDODERM) 5 % Place 1 patch onto the skin daily. Remove & Discard patch within 12 hours or as directed by MD 30 patch 0   magic mouthwash w/lidocaine SOLN Take 10 mLs by mouth 4 (four) times daily as needed for mouth pain. 120 mL 0   metoprolol tartrate (LOPRESSOR) 50 MG tablet Take 1 tablet (50 mg total) by mouth 2 (two) times daily. 180 tablet 3   montelukast (SINGULAIR) 10 MG tablet Take 1 tablet (10 mg total) by mouth at bedtime. 90 tablet 3   montelukast (SINGULAIR) 10 MG tablet Take by mouth.     nitroGLYCERIN (NITROSTAT) 0.4 MG SL tablet PLACE 1 TABLET UNDER THE TONGUE  EVERY 5 (FIVE) MINUTES AS NEEDED FOR CHEST PAIN. MAXIMUM OF 3 DOSES. 75 tablet 1   nystatin (MYCOSTATIN) 100000 UNIT/ML suspension Take 5 mLs (500,000 Units total) by mouth 4 (four) times daily. 60 mL 0   Omega-3 Fatty Acids (FISH OIL PO) Take 1 capsule by mouth 2 (two) times daily.     omeprazole (PRILOSEC) 40 MG capsule TAKE 1 CAPSULE BY MOUTH EVERY DAY 30 capsule 2   oxyCODONE-acetaminophen (PERCOCET) 5-325 MG tablet Take 1 tablet by mouth every 4 (four) hours as needed for severe pain. 30 tablet 0   PAZEO 0.7 % SOLN Place 1 drop into both eyes daily.     pramipexole  (MIRAPEX) 1 MG tablet Take 1 tablet (1 mg total) by mouth at bedtime. 30 tablet 1   predniSONE (STERAPRED UNI-PAK 48 TAB) 10 MG (48) TBPK tablet Take by mouth daily. 12-day taper pack, use as directed for taper 48 tablet 0   promethazine (PHENERGAN) 25 MG tablet Take 1 tablet (25 mg total) by mouth every 4 (four) hours as needed. 30 tablet 0   promethazine-dextromethorphan (PROMETHAZINE-DM) 6.25-15 MG/5ML syrup Take 5 mLs by mouth 4 (four) times daily as needed for cough. 240 mL 0   rosuvastatin (CRESTOR) 40 MG tablet TAKE 1 TABLET BY MOUTH EVERY DAY 90 tablet 3   TIADYLT ER 180 MG 24 hr capsule Take 180 mg by mouth daily.     No current facility-administered medications for this visit.    Patient confirms/reports the following allergies:  Allergies  Allergen Reactions   Penicillins Hives    Did it involve swelling of the face/tongue/throat, SOB, or low BP? No Did it involve sudden or severe rash/hives, skin peeling, or any reaction on the inside of your mouth or nose? Yes Did you need to seek medical attention at a hospital or doctor's office? Yes When did it last happen?      10 Years If all above answers are "NO", may proceed with cephalosporin use.    Penicillins Hives   Omnipaque [Iohexol] Itching        Red Dye Hives and Itching   Red Dye #40 (Allura Red) Itching    No orders of the defined types were placed in this encounter.   AUTHORIZATION INFORMATION Primary Insurance: 1D#: Group #:  Secondary Insurance: 1D#: Group #:  SCHEDULE INFORMATION: Date: 05/20/2023 Time: Location:  [facility]

## 2023-04-14 ENCOUNTER — Telehealth: Payer: Self-pay | Admitting: Internal Medicine

## 2023-04-14 NOTE — Telephone Encounter (Signed)
   Pre-operative Risk Assessment    Patient Name: Kimberly Kemp  DOB: 04/26/1965 MRN: [id number]   Date of last office visit: 12/25/2022 Date of next office visit: n/a   Request for Surgical Clearance    Procedure:   colonoscopy  Date of Surgery:  Clearance 05/20/23                                Surgeon:  Not indicated Surgeon's Group or Practice Name:  [facility] Phone number:  [phone] Fax number:  [phone]   Type of Clearance Requested:   - Medical    Type of Anesthesia:  General    Additional requests/questions:    Signed[employer]o [employer]   04/14/2023, 10:58 AM

## 2023-04-16 ENCOUNTER — Telehealth: Payer: Self-pay | Admitting: *Deleted

## 2023-04-16 NOTE — Telephone Encounter (Signed)
 Aspirin should be continued during the perioperative period unless there is concern for high risk of life-threatening bleeding.  Yvonne Kendall, MD [facility]

## 2023-04-16 NOTE — Telephone Encounter (Signed)
   Name: Kimberly Kemp  DOB: [birth year]  MRN: [id number]  Primary Cardiologist: Yvonne Kendall, MD   Preoperative team, please contact this patient and set up a phone call appointment for further preoperative risk assessment. Please obtain consent and complete medication review. Thank you for your help.  I confirm that guidance regarding antiplatelet and oral anticoagulation therapy has been completed and, if necessary, noted below.  Aspirin should be continued during the perioperative period unless there is concern for high risk of life-threatening bleeding.   Yvonne Kendall, MD [facility]  I also confirmed the patient resides in the state of [place]. As per [facility] Medical Board telemedicine laws, the patient must reside in the state in which the provider is licensed.   Ronney Asters, NP 04/16/2023, 12:26 PM [facility]

## 2023-04-16 NOTE — Telephone Encounter (Signed)
 Left message to call back to schedule tele pre op appt.

## 2023-04-16 NOTE — Telephone Encounter (Addendum)
 Fax received from [facility] to perform a Colonoscopy under general anesthesia on patient.  Patient needs surgery clearance. Surgery is 05/20/23. Patient was seen on 01/14/23 by Buelah Manis, NP . Office protocol is a risk assessment can be sent to surgeon if patient has been seen in 60 days or less.   Pt will need appt to be cleared  I called her and there was no answer- LMTCB

## 2023-04-17 ENCOUNTER — Other Ambulatory Visit: Payer: Self-pay | Admitting: Family Medicine

## 2023-04-17 NOTE — Telephone Encounter (Signed)
2nd attempt to reach pt to schedule tele pre op appt.

## 2023-04-22 ENCOUNTER — Telehealth: Payer: Self-pay | Admitting: Internal Medicine

## 2023-04-22 ENCOUNTER — Other Ambulatory Visit: Payer: Self-pay | Admitting: Family Medicine

## 2023-04-22 DIAGNOSIS — E7801 Familial hypercholesterolemia: Secondary | ICD-10-CM

## 2023-04-22 DIAGNOSIS — I10 Essential (primary) hypertension: Secondary | ICD-10-CM

## 2023-04-22 MED ORDER — PRAMIPEXOLE DIHYDROCHLORIDE 1 MG PO TABS: 1.0000 mg | ORAL_TABLET | Freq: Every day | ORAL | 0 refills | Status: DC

## 2023-04-22 NOTE — Telephone Encounter (Signed)
 Mirazpex refill sent in for 90 day.

## 2023-04-22 NOTE — Telephone Encounter (Signed)
 Copied from CRM [id number]. Topic: Clinical - Medication Refill >> Apr 22, 2023  1:45 PM Izetta Dakin wrote: Most Recent Primary Care Visit:  Provider: Alease Medina  Department: [facility]  Visit Type: NEW PT - OFFICE VISIT  Date: 03/23/2023  Medication: pramipexole (MIRAPEX) 1 MG tablet  Has the patient contacted their pharmacy? No (Agent: If no, request that the patient contact the pharmacy for the refill. If patient does not wish to contact the pharmacy document the reason why and proceed with request.) (Agent: If yes, when and what did the pharmacy advise?)  Is this the correct pharmacy for this prescription? Yes If no, delete pharmacy and type the correct one.  This is the patient's preferred pharmacy:  [phone] [place] - [address] [place] [address] Phone: [phone] Fax: [phone]     Has the prescription been filled recently? No  Is the patient out of the medication? Yes  Has the patient been seen for an appointment in the last year OR does the patient have an upcoming appointment? Yes  Can we respond through MyChart? No, please call patient  Agent: Please be advised that Rx refills may take up to 3 business days. We ask that you follow-up with your pharmacy.

## 2023-04-22 NOTE — Telephone Encounter (Signed)
 Copied from CRM [id number]. Topic: Clinical - Medication Refill >> Apr 13, 2023  2:24 PM Izetta Dakin wrote: Most Recent Primary Care Visit:  Provider: Alease Medina  Department: [facility]  Visit Type: NEW PT - OFFICE VISIT  Date: 03/23/2023  Medication: ezetimibe (ZETIA) 10 MG tablet diltiazem (CARDIZEM CD) 240 MG 24 hr capsule montelukast (SINGULAIR) 10 MG tablet   Has the patient contacted their pharmacy? Yes (Agent: If no, request that the patient contact the pharmacy for the refill. If patient does not wish to contact the pharmacy document the reason why and proceed with request.) (Agent: If yes, when and what did the pharmacy advise?)  Is this the correct pharmacy for this prescription? Yes If no, delete pharmacy and type the correct one.  This is the patient's preferred pharmacy:  [phone] [place] - [address] [place] [address] Phone: [phone] Fax: [phone]    Has the prescription been filled recently? No  Is the patient out of the medication? Yes  Has the patient been seen for an appointment in the last year OR does the patient have an upcoming appointment? Yes  Can we respond through MyChart? No  Agent: Please be advised that Rx refills may take up to 3 business days. We ask that you follow-up with your pharmacy. >> Apr 22, 2023  1:41 PM Abundio Miu S wrote: Patient calling to check the status of her medication. She states pharmacy has not received it.

## 2023-04-22 NOTE — Telephone Encounter (Signed)
*  STAT* If patient is at the pharmacy, call can be transferred to refill team.   1. Which medications need to be refilled? (please list name of each medication and dose if known)   ezetimibe (ZETIA) 10 MG tablet   2. Would you like to learn more about the convenience, safety, & potential cost savings by using the [facility]?   3. Are you open to using the [facility] (Type [facility]. ).  4. Which pharmacy/location (including street and city if local pharmacy) is medication to be sent to?  [id number] [place], [place] - [address]   5. Do they need a 30 day or 90 day supply?  90 day  Patient stated her pharmacy has not received this prescription and wants the refill re-sent to [id number] [place], [place] - [address]  Patient stated she is completely out of this medication.

## 2023-04-23 MED ORDER — EZETIMIBE 10 MG PO TABS: 10.0000 mg | ORAL_TABLET | Freq: Every day | ORAL | 0 refills | Status: DC

## 2023-04-23 NOTE — Telephone Encounter (Signed)
 3rd attempt to reach the pt to schedule tele preop appt.

## 2023-04-27 ENCOUNTER — Telehealth: Payer: Self-pay | Admitting: *Deleted

## 2023-04-27 ENCOUNTER — Encounter: Payer: Self-pay | Admitting: *Deleted

## 2023-04-27 NOTE — Telephone Encounter (Signed)
 Received message from Dr Serita Kyle office that they have been trying to contact patient but no success.   I have been trying calling patient on both numbers on file. Left messages as well.   Sending letter to patient to call.

## 2023-04-29 NOTE — Telephone Encounter (Signed)
 Tried calling patient again. It went to voicemail again.

## 2023-05-05 NOTE — Telephone Encounter (Signed)
 1st attempt to reach the pt on the new cell # provided to our office. Pt needs to set up tele preop.

## 2023-05-05 NOTE — Telephone Encounter (Signed)
 Pt returning call, she states her number has changed - [phone]

## 2023-05-05 NOTE — Telephone Encounter (Signed)
 Tried calling patient again. It went to voicemail again.

## 2023-05-07 NOTE — Telephone Encounter (Signed)
 3rd attempt: Called patient, NA, no vmbox to leave message.

## 2023-05-11 NOTE — Telephone Encounter (Signed)
 Tried calling patient again. It went to voicemail again.   Letter have been sent to have patient call cardiology to get clear.

## 2023-05-13 ENCOUNTER — Telehealth: Payer: Self-pay

## 2023-05-13 NOTE — Telephone Encounter (Signed)
 Spoken to patient regarding the flu symptoms. Will cancel colonoscopy, patient stated that she will call back when she is ready.  Called endo unit to make the change.

## 2023-05-13 NOTE — Telephone Encounter (Signed)
 The patient called to reschedule her procedure due to illness in her household. She reported that she is experiencing symptoms including a stuffy nose, sore throat, and a chest cold

## 2023-05-20 ENCOUNTER — Other Ambulatory Visit: Payer: Self-pay | Admitting: Family Medicine

## 2023-05-20 ENCOUNTER — Telehealth: Payer: Self-pay | Admitting: Internal Medicine

## 2023-05-20 ENCOUNTER — Other Ambulatory Visit: Payer: Self-pay

## 2023-05-20 ENCOUNTER — Emergency Department

## 2023-05-20 ENCOUNTER — Emergency Department
Admission: EM | Admit: 2023-05-20 | Discharge: 2023-05-20 | Disposition: A | Discharge status: Home / Self Care | Attending: Emergency Medicine | Admitting: Emergency Medicine

## 2023-05-20 ENCOUNTER — Ambulatory Visit: Admit: 2023-05-20 | Admitting: Gastroenterology

## 2023-05-20 DIAGNOSIS — I2510 Atherosclerotic heart disease of native coronary artery without angina pectoris: Secondary | ICD-10-CM | POA: Diagnosis not present

## 2023-05-20 DIAGNOSIS — R002 Palpitations: Secondary | ICD-10-CM | POA: Diagnosis not present

## 2023-05-20 DIAGNOSIS — R918 Other nonspecific abnormal finding of lung field: Secondary | ICD-10-CM | POA: Diagnosis not present

## 2023-05-20 DIAGNOSIS — F17200 Nicotine dependence, unspecified, uncomplicated: Secondary | ICD-10-CM | POA: Diagnosis not present

## 2023-05-20 DIAGNOSIS — I10 Essential (primary) hypertension: Secondary | ICD-10-CM | POA: Diagnosis not present

## 2023-05-20 DIAGNOSIS — J441 Chronic obstructive pulmonary disease with (acute) exacerbation: Secondary | ICD-10-CM | POA: Insufficient documentation

## 2023-05-20 DIAGNOSIS — J101 Influenza due to other identified influenza virus with other respiratory manifestations: Secondary | ICD-10-CM | POA: Insufficient documentation

## 2023-05-20 DIAGNOSIS — J111 Influenza due to unidentified influenza virus with other respiratory manifestations: Secondary | ICD-10-CM

## 2023-05-20 LAB — TROPONIN I (HIGH SENSITIVITY)
Troponin I (High Sensitivity): 7 ng/L (ref <18)
Troponin I (High Sensitivity): 5 ng/L (ref <18)

## 2023-05-20 LAB — CBC
HCT: 43.2 % (ref 36.0–46.0)
Hemoglobin: 14.9 g/dL (ref 12.0–15.0)
MCH: 31.2 pg (ref 26.0–34.0)
MCHC: 34.5 g/dL (ref 30.0–36.0)
MCV: 90.4 fL (ref 80.0–100.0)
Platelets: 172 10*3/uL (ref 150–400)
RBC: 4.78 MIL/uL (ref 3.87–5.11)
RDW: 15.4 % (ref 11.5–15.5)
WBC: 8.4 10*3/uL (ref 4.0–10.5)
nRBC: 0 % (ref 0.0–0.2)

## 2023-05-20 LAB — BASIC METABOLIC PANEL WITH GFR
Anion gap: 9 (ref 5–15)
BUN: 20 mg/dL (ref 6–20)
CO2: 18 mmol/L — ABNORMAL LOW (ref 22–32)
Calcium: 9 mg/dL (ref 8.9–10.3)
Chloride: 107 mmol/L (ref 98–111)
Creatinine, Ser: 1.05 mg/dL — ABNORMAL HIGH (ref 0.44–1.00)
GFR, Estimated: >60 mL/min (ref >60)
Glucose, Bld: 108 mg/dL — ABNORMAL HIGH (ref 70–99)
Potassium: 3.7 mmol/L (ref 3.5–5.1)
Sodium: 134 mmol/L — ABNORMAL LOW (ref 135–145)

## 2023-05-20 LAB — RESP PANEL BY RT-PCR (RSV, FLU A&B, COVID)  RVPGX2
Influenza A by PCR: NEGATIVE
Influenza B by PCR: POSITIVE — AB
Resp Syncytial Virus by PCR: NEGATIVE
SARS Coronavirus 2 by RT PCR: NEGATIVE

## 2023-05-20 SURGERY — COLONOSCOPY
Anesthesia: General

## 2023-05-20 MED ORDER — METHYLPREDNISOLONE SODIUM SUCC 125 MG IJ SOLR
125.0000 mg | Freq: Once | INTRAMUSCULAR | Status: AC
  Administered 2023-05-20: 125 mg via INTRAVENOUS
  Filled 2023-05-20: qty 2

## 2023-05-20 MED ORDER — IPRATROPIUM-ALBUTEROL 0.5-2.5 (3) MG/3ML IN SOLN
6.0000 mL | Freq: Once | RESPIRATORY_TRACT | Status: AC
  Administered 2023-05-20: 6 mL via RESPIRATORY_TRACT
  Filled 2023-05-20: qty 6

## 2023-05-20 MED ORDER — LACTATED RINGERS IV BOLUS
1000.0000 mL | Freq: Once | INTRAVENOUS | Status: AC
  Administered 2023-05-20: 1000 mL via INTRAVENOUS

## 2023-05-20 MED ORDER — PREDNISONE 50 MG PO TABS: 50.0000 mg | ORAL_TABLET | Freq: Every day | ORAL | 0 refills | Status: AC

## 2023-05-20 NOTE — Discharge Instructions (Signed)
Use Tylenol for pain and fevers.  Up to 1000 mg per dose, up to 4 times per day.  Do not take more than 4000 mg of Tylenol/acetaminophen within 24 hours..  Use naproxen/Aleve for anti-inflammatory pain relief. Use up to 500mg every 12 hours. Do not take more frequently than this. Do not use other NSAIDs (ibuprofen, Advil) while taking this medication. It is safe to take Tylenol with this.   

## 2023-05-20 NOTE — Telephone Encounter (Signed)
 Patient c/o Palpitations:  STAT if patient reporting lightheadedness, shortness of breath, or chest pain  How long have you had palpitations/irregular HR/ Afib? Are you having the symptoms now? Afib  Are you currently experiencing lightheadedness, SOB or CP? SOB, lightheadedness  Do you have a history of afib (atrial fibrillation) or irregular heart rhythm? Afib  Have you checked your BP or HR? (document readings if available): No  Are you experiencing any other symptoms? nausea

## 2023-05-20 NOTE — ED Provider Notes (Addendum)
 [facility] Provider Note    Event Date/Time   First MD Initiated Contact with Patient 05/20/23 2152     (approximate)   History   Palpitations   HPI  Kimberly Kemp is a 58 y.o. female who presents to the ED for evaluation of Palpitations   I review a cardiology clinic visit from November.  History of CAD s/p PCI to RCA in the setting of iatrogenic RCA dissection 2021, ischemic cardiomyopathy, HTN, HLD, stroke, anxiety, tobacco abuse, COPD.  Documented "questionable" PE and atrial fibrillation  Patient presents for evaluation of sensation of a "quivering heart" in the setting of about 5 days of a URI.  Reports that she and her daughter have been "bouncing back-and-forth" with a cold.  Patient reports a nonproductive cough.  No discrete pain, no syncope.  No fevers   Physical Exam   Triage Vital Signs: ED Triage Vitals  Encounter Vitals Group     BP 05/20/23 2025 107/82     Systolic BP Percentile --      Diastolic BP Percentile --      Pulse Rate 05/20/23 2022 (!) 106     Resp 05/20/23 2022 18     Temp 05/20/23 2022 99.3 F (37.4 C)     Temp Source 05/20/23 2022 Oral     SpO2 05/20/23 2022 100 %     Weight 05/20/23 2021 124 lb (56.2 kg)     Height 05/20/23 2021 5\' 3"  (1.6 m)     Head Circumference --      Peak Flow --      Pain Score 05/20/23 2021 8     Pain Loc --      Pain Education --      Exclude from Growth Chart --     Most recent vital signs: Vitals:   05/20/23 2025 05/20/23 2230  BP: 107/82 106/68  Pulse:  70  Resp:  15  Temp:    SpO2:  98%    General: Awake, no distress.  CV:  Good peripheral perfusion.  Resp:  Scattered expiratory wheezes.  Slightly decreased airflow.  No focal features Abd:  No distention.  MSK:  No deformity noted.  Neuro:  No focal deficits appreciated. Other:     ED Results / Procedures / Treatments   Labs (all labs ordered are listed, but only abnormal results are displayed) Labs Reviewed   RESP PANEL BY RT-PCR (RSV, FLU A&B, COVID)  RVPGX2 - Abnormal; Notable for the following components:      Result Value   Influenza B by PCR POSITIVE (*)    All other components within normal limits  BASIC METABOLIC PANEL WITH GFR - Abnormal; Notable for the following components:   Sodium 134 (*)    CO2 18 (*)    Glucose, Bld 108 (*)    Creatinine, Ser 1.05 (*)    All other components within normal limits  CBC  TROPONIN I (HIGH SENSITIVITY)  TROPONIN I (HIGH SENSITIVITY)    EKG Sinus tachycardia with rate of 108 bpm.  Treatment is baseline.  No clear signs of acute ischemia. Similar morphology as comparison from 2 months ago  RADIOLOGY CXR interpreted by me without evidence of acute cardiopulmonary pathology.  Official radiology report(s): DG Chest Port 1 View Result Date: 05/20/2023 CLINICAL DATA:  Palpitations. EXAM: PORTABLE CHEST 1 VIEW COMPARISON:  01/14/2023 FINDINGS: The cardiomediastinal contours are normal. Chronic hyperinflation and bronchial thickening. Pulmonary vasculature is normal. No consolidation, pleural effusion,  or pneumothorax. No acute osseous abnormalities are seen. IMPRESSION: Chronic hyperinflation and bronchial thickening. No acute findings. Electronically Signed   By: Chadwick Colonel M.D.   On: 05/20/2023 21:25    PROCEDURES and INTERVENTIONS:  .1-3 Lead EKG Interpretation  Performed by: Arline Bennett, MD Authorized by: Arline Bennett, MD     Interpretation: normal     ECG rate:  70   ECG rate assessment: normal     Rhythm: sinus rhythm     Ectopy: none     Conduction: normal     Medications  ipratropium-albuterol (DUONEB) 0.5-2.5 (3) MG/3ML nebulizer solution 6 mL (6 mLs Nebulization Given 05/20/23 2227)  methylPREDNISolone sodium succinate (SOLU-MEDROL) 125 mg/2 mL injection 125 mg (125 mg Intravenous Given 05/20/23 2228)  lactated ringers bolus 1,000 mL (1,000 mLs Intravenous New Bag/Given 05/20/23 2228)     IMPRESSION / MDM / ASSESSMENT AND  PLAN / ED COURSE  I reviewed the triage vital signs and the nursing notes.  Differential diagnosis includes, but is not limited to, ACS, PTX, PNA, muscle strain/spasm, PE, dissection, anxiety, pleural effusion, COPD exacerbation,  {Patient presents with symptoms of an acute illness or injury that is potentially life-threatening.  Patient presents with evidence of a COPD exacerbation.  She is in a sinus rhythm without signs of atrial fibrillation, rate controlled the presenting tachycardia is noted.  I think PE is less likely.  On exam she has evidence of COPD exacerbation.  Negative troponin, normal CBC.  Non-anion gap metabolic acidosis for which she receives IV fluids, also receiving IV steroids to start course of steroids for her COPD.  She is signed out to oncoming physician to follow-up on her clinical status, repeat troponin.  If this repeat troponin is negative, she is feeling better, she may be suitable for outpatient management.  Just after signout while I am still here, second troponin returns negative and influenza test returns positive.  I reassessed the patient and she reports feeling much better and is appreciative.  She is suitable for outpatient management.  I considered admission for this patient.      FINAL CLINICAL IMPRESSION(S) / ED DIAGNOSES   Final diagnoses:  COPD exacerbation (HCC)  Heart palpitations  Influenza     Rx / DC Orders   ED Discharge Orders          Ordered    predniSONE (DELTASONE) 50 MG tablet  Daily        05/20/23 2247             Note:  This document was prepared using Dragon voice recognition software and may include unintentional dictation errors.   Arline Bennett, MD 05/20/23 5409    Arline Bennett, MD 05/20/23 [phone]

## 2023-05-20 NOTE — Telephone Encounter (Signed)
Attempted to reach the patient. Was unable to leave a message 

## 2023-05-20 NOTE — ED Triage Notes (Signed)
 Pt reports she has had URI for the past week, pt reports she has had intermittent palpitations for the past 5 days. Pt has hx afib.

## 2023-05-20 NOTE — Telephone Encounter (Signed)
 STAT call completed at 1635  Pt reports SOB/N/V and light-headed for the last 5 days, pt reports "catching a cold" from her daughter, and "fever" (no temp checked), and sweats at night, pt reports taking her regular meds but must take Zofran to "keep them down"  Pt reports that her s/sx started on April 9 with HA, sore throat, and muscle soreness - pt denies positive Covid Test  Pt STAT call triggered by her "heart quivering", reports history of Afib and 2 x stents  Pt advised to proceed to ED, declined my offer to call 911, reports has family there at home that will take her - later call by Edwina Gram, RN found no answer at pt's home

## 2023-05-21 ENCOUNTER — Other Ambulatory Visit: Payer: Self-pay | Admitting: Internal Medicine

## 2023-05-21 DIAGNOSIS — E7801 Familial hypercholesterolemia: Secondary | ICD-10-CM

## 2023-05-26 ENCOUNTER — Other Ambulatory Visit: Payer: Self-pay | Admitting: Cardiology

## 2023-05-26 DIAGNOSIS — E7801 Familial hypercholesterolemia: Secondary | ICD-10-CM

## 2023-05-26 MED ORDER — ROSUVASTATIN CALCIUM 40 MG PO TABS: 40.0000 mg | ORAL_TABLET | Freq: Every day | ORAL | 0 refills | Status: DC

## 2023-05-26 NOTE — Progress Notes (Signed)
 Patient called in requesting refill of crestor  40mg  daily. Script sent for 30tabs, no refills

## 2023-06-01 ENCOUNTER — Emergency Department

## 2023-06-01 ENCOUNTER — Other Ambulatory Visit: Payer: Self-pay

## 2023-06-01 ENCOUNTER — Emergency Department
Admission: EM | Admit: 2023-06-01 | Discharge: 2023-06-01 | Disposition: A | Discharge status: Home / Self Care | Attending: Emergency Medicine | Admitting: Emergency Medicine

## 2023-06-01 DIAGNOSIS — Y9301 Activity, walking, marching and hiking: Secondary | ICD-10-CM | POA: Diagnosis not present

## 2023-06-01 DIAGNOSIS — S99922A Unspecified injury of left foot, initial encounter: Secondary | ICD-10-CM | POA: Diagnosis not present

## 2023-06-01 DIAGNOSIS — M7989 Other specified soft tissue disorders: Secondary | ICD-10-CM | POA: Diagnosis not present

## 2023-06-01 DIAGNOSIS — W228XXA Striking against or struck by other objects, initial encounter: Secondary | ICD-10-CM | POA: Insufficient documentation

## 2023-06-01 MED ORDER — ACETAMINOPHEN 325 MG PO TABS
650.0000 mg | ORAL_TABLET | Freq: Once | ORAL | Status: AC
  Administered 2023-06-01: 650 mg via ORAL
  Filled 2023-06-01: qty 2

## 2023-06-01 NOTE — ED Triage Notes (Signed)
 Patient comes in from home via pov. Pt states that yesterday hit her foot on a 25 pound weight yesterday while walking. Pt states that she broke the same foot almost a year and a half ago. Pt with swelling and slight discoloration to the bottom of her left pinky toe. Pt is alert and oriented x4 with no signs of acute distress at this time.

## 2023-06-01 NOTE — ED Provider Notes (Signed)
 [facility] Emergency Department Provider Note     Event Date/Time   First MD Initiated Contact with Patient 06/01/23 2101     (approximate)   History   Foot Injury   HPI  Kimberly Kemp is a 58 y.o. female presents to the ED for evaluation of left foot pain after hitting her foot on a weight yesterday.  Patient reports history of a fracture to this foot.  She states pain with weightbearing.  No other complaints.     Physical Exam   Triage Vital Signs: ED Triage Vitals  Encounter Vitals Group     BP 06/01/23 2109 (!) 155/89     Systolic BP Percentile --      Diastolic BP Percentile --      Pulse Rate 06/01/23 2109 70     Resp 06/01/23 2109 18     Temp 06/01/23 2109 98.4 F (36.9 C)     Temp Source 06/01/23 2108 Oral     SpO2 06/01/23 2109 100 %     Weight 06/01/23 1805 124 lb (56.2 kg)     Height 06/01/23 1805 5\' 3"  (1.6 m)     Head Circumference --      Peak Flow --      Pain Score 06/01/23 1805 10     Pain Loc --      Pain Education --      Exclude from Growth Chart --     Most recent vital signs: Vitals:   06/01/23 2109  BP: (!) 155/89  Pulse: 70  Resp: 18  Temp: 98.4 F (36.9 C)  SpO2: 100%    General Awake, no distress.  HEENT NCAT.  CV:  Good peripheral perfusion.  RESP:  Normal effort.  ABD:  No distention.  Other:  Left foot reveals no visible deformity.  No ecchymosis or swelling.  Tenderness over distal 5th and 4th metatarsal.  F ROM of all digits.  Palpable pedal pulses.  Neurovascular status intact all throughout.   ED Results / Procedures / Treatments   Labs (all labs ordered are listed, but only abnormal results are displayed) Labs Reviewed - No data to display  RADIOLOGY  I personally viewed and evaluated these images as part of my medical decision making, as well as reviewing the written report by the radiologist.  ED Provider Interpretation: No acute bony abnormality.  DG Foot Complete Left Result  Date: 06/01/2023 CLINICAL DATA:  Injury yesterday, swelling, discoloration EXAM: LEFT FOOT - COMPLETE 3+ VIEW COMPARISON:  10/01/2021 FINDINGS: Frontal, oblique, and lateral views of the left foot are obtained. Prior healed fifth metatarsal fracture. No acute fracture, subluxation, or dislocation. Joint spaces are well preserved. Soft tissues are unremarkable. IMPRESSION: 1. No acute displaced fracture. 2. Prior healed fifth metatarsal fracture. Electronically Signed   By: Bobbye Burrow M.D.   On: 06/01/2023 18:54    PROCEDURES:  Critical Care performed: No  Procedures   MEDICATIONS ORDERED IN ED: Medications  acetaminophen  (TYLENOL ) tablet 650 mg (has no administration in time range)     IMPRESSION / MDM / ASSESSMENT AND PLAN / ED COURSE  I reviewed the triage vital signs and the nursing notes.                               58 y.o. female presents to the emergency department for evaluation and treatment of left foot injury. See HPI for further  details.   Differential diagnosis includes, but is not limited to fracture, dislocation, contusion, sprain  Patient's presentation is most consistent with acute complicated illness / injury requiring diagnostic workup.  Patient is alert and oriented.  She is hemodynamically stable.  Physical exam findings are stated above.  X-ray is reassuring.  Patient placed in an Ace wrap, postop shoe and provided with crutches for weightbearing as tolerated.  Tylenol  provided in ED.  RICE therapy education provided.  Patient is in stable condition for discharge home and outpatient follow-up as needed.  Encouraged to follow-up with orthopedic if symptoms not improved in 1 week.  ED return precautions discussed.  FINAL CLINICAL IMPRESSION(S) / ED DIAGNOSES   Final diagnoses:  Injury of left foot, initial encounter   Rx / DC Orders   ED Discharge Orders     None      Note:  This document was prepared using Dragon voice recognition software and may  include unintentional dictation errors.    Phyllis Breeze, Luiza Carranco A, PA-C 06/01/23 2127    Arline Bennett, MD 06/01/23 2141

## 2023-06-01 NOTE — Discharge Instructions (Addendum)
 You were evaluated in the ED for left foot pain.  Your x-ray is reassuring.  No broken bone or fracture.  Please use crutches provided for assistance with weightbearing as tolerated.  Apply ice to the affected area to help with swelling.  Elevate the foot above heart level is much as possible.  Take Tylenol  for pain as needed.  Follow-up with orthopedic if symptoms do not improve.

## 2023-06-01 NOTE — ED Notes (Signed)
 Ace wrap and post op shoe placed on pt's L foot. Pt provided crutches. Pt educated on use of all of these.

## 2023-06-03 ENCOUNTER — Telehealth: Payer: Self-pay | Admitting: Internal Medicine

## 2023-06-03 MED ORDER — METOPROLOL TARTRATE 50 MG PO TABS: 50.0000 mg | ORAL_TABLET | Freq: Two times a day (BID) | ORAL | 0 refills | Status: DC

## 2023-06-03 NOTE — Telephone Encounter (Signed)
*  STAT* If patient is at the pharmacy, call can be transferred to refill team.   1. Which medications need to be refilled? (please list name of each medication and dose if known)   metoprolol  tartrate (LOPRESSOR ) 50 MG tablet    2. Which pharmacy/location (including street and city if local pharmacy) is medication to be sent to? [id number] [place], [place] - [address] Phone: [phone]  Fax: [phone]      3. Do they need a 30 day or 90 day supply? ? Tried to schedule an appt for f/u in May and she said she is not due to come in and did not want to make one. I have told her if she doesn't make an appt then she will probably only get 15 tablets

## 2023-06-04 NOTE — Telephone Encounter (Signed)
 Called pt and there was no answer- unable to leave msg Closing per protocol

## 2023-06-15 ENCOUNTER — Encounter (HOSPITAL_COMMUNITY): Payer: Self-pay

## 2023-06-18 ENCOUNTER — Other Ambulatory Visit: Payer: Self-pay

## 2023-06-18 DIAGNOSIS — E7801 Familial hypercholesterolemia: Secondary | ICD-10-CM

## 2023-06-18 MED ORDER — ROSUVASTATIN CALCIUM 40 MG PO TABS: 40.0000 mg | ORAL_TABLET | Freq: Every day | ORAL | 1 refills | Status: DC

## 2023-06-19 ENCOUNTER — Telehealth: Payer: Self-pay | Admitting: Internal Medicine

## 2023-06-19 NOTE — Telephone Encounter (Signed)
*  STAT* If patient is at the pharmacy, call can be transferred to refill team.   1. Which medications need to be refilled? (please list name of each medication and dose if known)   rosuvastatin  (CRESTOR ) 40 MG tablet   2. Would you like to learn more about the convenience, safety, & potential cost savings by using the [facility]?   3. Are you open to using the [facility] (Type [facility]. ).  4. Which pharmacy/location (including street and city if local pharmacy) is medication to be sent to?  [id number] [place], [place] - [address]   5. Do they need a 30 day or 90 day supply?  90 day  Patient stated she only has 7 tablets left.

## 2023-06-19 NOTE — Telephone Encounter (Signed)
 Pt's medication was already sent to pt's pharmacy as requested. Confirmation received.

## 2023-06-19 NOTE — Telephone Encounter (Signed)
  Pt c/o of Chest Pain: STAT if active CP, including tightness, pressure, jaw pain, radiating pain to shoulder/upper arm/back, CP unrelieved by Nitro. Symptoms reported of SOB, nausea, vomiting, sweating.  1. Are you having CP right now?   Yes - not as bad as it has been  2. Are you experiencing any other symptoms (ex. SOB, nausea, vomiting, sweating)?   Nausea  3. Is your CP continuous or coming and going?   Coming and going  4. Have you taken Nitroglycerin ?   Yes  5. How long have you been experiencing CP?   Patient stated this started 3 days ago    6. If NO CP at time of call then end call with telling Pt to call back or call 911 if Chest pain returns prior to return call from triage team.   Patient stated she took 3 aspirins today and also 1 nitroglycerin  tablet.

## 2023-06-19 NOTE — Telephone Encounter (Signed)
 The patient called reporting that for the past three days she has been experiencing intermittent chest pain. She described it as a sharp pain that transitions into a tightness in the center of her chest. She noted that it takes her breath away and makes her feel like she cannot breathe. She also reported briefly passing out during an episode on Wednesday.  The patient denies chest pain currently but stated the last episode occurred three hours ago and was relieved with three aspirin  and one nitroglycerin .  Based on her symptoms, the nurse recommended that the patient go to the emergency room for further evaluation.

## 2023-06-22 ENCOUNTER — Ambulatory Visit: Payer: 59 | Admitting: Family Medicine

## 2023-06-23 ENCOUNTER — Ambulatory Visit: Admitting: Family Medicine

## 2023-06-26 ENCOUNTER — Other Ambulatory Visit: Payer: Self-pay | Admitting: Nurse Practitioner

## 2023-07-01 ENCOUNTER — Other Ambulatory Visit: Payer: Self-pay | Admitting: Family Medicine

## 2023-07-01 DIAGNOSIS — F419 Anxiety disorder, unspecified: Secondary | ICD-10-CM

## 2023-07-01 NOTE — Telephone Encounter (Unsigned)
 Copied from CRM [id number]. Topic: Clinical - Medication Refill >> Jul 01, 2023  5:44 PM DeAngela L wrote: Medication: clonazePAM  (KLONOPIN ) 0.5 MG tablet magic mouthwash w/lidocaine  SOLN gabapentin  (NEURONTIN ) 300 MG capsule butalbital -acetaminophen -caffeine  (FIORICET , ESGIC ) 50-325-40 MG tablet promethazine  (PHENERGAN ) 25 MG tablet  Has the patient contacted their pharmacy? No  (Agent: If no, request that the patient contact the pharmacy for the refill. If patient does not wish to contact the pharmacy document the reason why and proceed with request.) (Agent: If yes, when and what did the pharmacy advise?)  This is the patient's preferred pharmacy:  [phone] [place] - [address] [address] Phone: [phone] Fax: [phone]  Is this the correct pharmacy for this prescription? Yes  If no, delete pharmacy and type the correct one.   Has the prescription been filled recently? Yes   Is the patient out of the medication? Yes   Has the patient been seen for an appointment in the last year OR does the patient have an upcoming appointment? Yes   Can we respond through MyChart? No  Agent: Please be advised that Rx refills may take up to 3 business days. We ask that you follow-up with your pharmacy.

## 2023-07-02 ENCOUNTER — Telehealth: Payer: Self-pay | Admitting: Nurse Practitioner

## 2023-07-02 NOTE — Telephone Encounter (Signed)
*  STAT* If patient is at the pharmacy, call can be transferred to refill team.   1. Which medications need to be refilled? (please list name of each medication and dose if known) metoprolol  tartrate (LOPRESSOR ) 50 MG tablet    2. Would you like to learn more about the convenience, safety, & potential cost savings by using the [facility]? No   3. Are you open to using the [facility] (Type [facility] ) No   4. Which pharmacy/location (including street and city if local pharmacy) is medication to be sent to?  [id number] [place], [place] - [address]    5. Do they need a 30 day or 90 day supply? 90 day  Pt has scheduled appt on 9/3

## 2023-07-02 NOTE — Telephone Encounter (Signed)
 RX already sent to the Pharmacy Requested

## 2023-07-06 ENCOUNTER — Encounter: Payer: Self-pay | Admitting: Family Medicine

## 2023-07-06 ENCOUNTER — Ambulatory Visit (INDEPENDENT_AMBULATORY_CARE_PROVIDER_SITE_OTHER): Admitting: Family Medicine

## 2023-07-06 VITALS — BP 137/56 | HR 78 | Temp 98.2°F | Resp 20 | Ht 63.0 in | Wt 124.0 lb

## 2023-07-06 DIAGNOSIS — R110 Nausea: Secondary | ICD-10-CM | POA: Diagnosis not present

## 2023-07-06 DIAGNOSIS — F419 Anxiety disorder, unspecified: Secondary | ICD-10-CM

## 2023-07-06 DIAGNOSIS — Z131 Encounter for screening for diabetes mellitus: Secondary | ICD-10-CM | POA: Diagnosis not present

## 2023-07-06 DIAGNOSIS — G2581 Restless legs syndrome: Secondary | ICD-10-CM | POA: Diagnosis not present

## 2023-07-06 DIAGNOSIS — J441 Chronic obstructive pulmonary disease with (acute) exacerbation: Secondary | ICD-10-CM | POA: Diagnosis not present

## 2023-07-06 DIAGNOSIS — F411 Generalized anxiety disorder: Secondary | ICD-10-CM | POA: Diagnosis not present

## 2023-07-06 DIAGNOSIS — K219 Gastro-esophageal reflux disease without esophagitis: Secondary | ICD-10-CM | POA: Diagnosis not present

## 2023-07-06 DIAGNOSIS — I10 Essential (primary) hypertension: Secondary | ICD-10-CM | POA: Diagnosis not present

## 2023-07-06 DIAGNOSIS — E7801 Familial hypercholesterolemia: Secondary | ICD-10-CM

## 2023-07-06 DIAGNOSIS — B370 Candidal stomatitis: Secondary | ICD-10-CM

## 2023-07-06 MED ORDER — PROMETHAZINE HCL 25 MG PO TABS
25.0000 mg | ORAL_TABLET | ORAL | 0 refills | Status: DC | PRN
Start: 2023-07-06 — End: 2023-10-02

## 2023-07-06 MED ORDER — PRAMIPEXOLE DIHYDROCHLORIDE 1 MG PO TABS
1.0000 mg | ORAL_TABLET | Freq: Every day | ORAL | 0 refills | Status: DC
Start: 2023-07-06 — End: 2023-10-02

## 2023-07-06 MED ORDER — OMEPRAZOLE 40 MG PO CPDR: DELAYED_RELEASE_CAPSULE | ORAL | 2 refills | Status: DC

## 2023-07-06 MED ORDER — FAMOTIDINE 40 MG PO TABS: 40.0000 mg | ORAL_TABLET | Freq: Every day | ORAL | 3 refills | Status: DC

## 2023-07-06 MED ORDER — GABAPENTIN 300 MG PO CAPS: 300.0000 mg | ORAL_CAPSULE | Freq: Every day | ORAL | 1 refills | Status: DC

## 2023-07-06 MED ORDER — CLONAZEPAM 0.5 MG PO TABS: 0.5000 mg | ORAL_TABLET | Freq: Two times a day (BID) | ORAL | 0 refills | Status: DC | PRN

## 2023-07-06 MED ORDER — AZITHROMYCIN 250 MG PO TABS: ORAL_TABLET | ORAL | 0 refills | Status: AC

## 2023-07-06 MED ORDER — ROSUVASTATIN CALCIUM 40 MG PO TABS: 40.0000 mg | ORAL_TABLET | Freq: Every day | ORAL | 1 refills | Status: DC

## 2023-07-06 MED ORDER — NYSTATIN 100000 UNIT/ML MT SUSP: 5.0000 mL | Freq: Four times a day (QID) | OROMUCOSAL | 0 refills | Status: DC

## 2023-07-06 NOTE — Assessment & Plan Note (Signed)
 Last fill of clonazepam  0.5 #30 was 03/23/2023 will refill clonazepam  No. 30 with no refills.

## 2023-07-06 NOTE — Progress Notes (Signed)
 Established Patient Office Visit  Subjective   Patient ID: Kimberly Kemp, female    DOB: Jun 06, 1965  Age: 58 y.o. MRN: [id number]  Chief Complaint  Patient presents with   URI   Cough    Yellow mucus. 2 weeks    URI  Associated symptoms include coughing.  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 Nolan Battle), ischemic cardiomyopathy, PVD (subclavian artery stenosis), mixed hyperlipidemia, CVA, questionable PE and atrial fibrillation, hepatitis B, Hx de Quervain's tenosynovitis.       She reports an exacerbation of her COPD.  She is coughing up thick yellowish stuff.  She denies fever or chills.  She does endorse some shortness of breath that occurs at night.  She gets up and uses her nebulizers.  She is currently on DuoNebs and budesonide  0.25 mg twice daily.  She reports that when she takes doxycycline  she breaks out in hives.      Had a colonoscopy ordered but has not followed through. Mammography was ordered for her to 03/2023 but she has not gotten mammogram yet.        She is out of all of her medicines today.    Review of Systems  Respiratory:  Positive for cough.       Objective:     BP (!) 137/56 (BP Location: Left Arm, Patient Position: Sitting, Cuff Size: Normal)   Pulse 78   Temp 98.2 F (36.8 C) (Oral)   Resp 20   Ht 5\' 3"  (1.6 m)   Wt 124 lb (56.2 kg)   LMP 07/18/2016   SpO2 95%   BMI 21.97 kg/m    Physical Exam Vitals and nursing note reviewed.  Constitutional:      Appearance: Normal appearance.  HENT:     Head: Normocephalic and atraumatic.  Eyes:     Conjunctiva/sclera: Conjunctivae normal.  Cardiovascular:     Rate and Rhythm: Normal rate and regular rhythm.  Pulmonary:     Effort:  Pulmonary effort is normal.     Breath sounds: Normal breath sounds. No wheezing (Negative wheezing to forced expiration).  Musculoskeletal:     Right lower leg: No edema.     Left lower leg: No edema.  Skin:    General: Skin is warm and dry.  Neurological:     Mental Status: She is alert and oriented to person, place, and time.  Psychiatric:        Mood and Affect: Mood normal.        Behavior: Behavior normal.        Thought Content: Thought content normal.        Judgment: Judgment normal.          No results found for any visits on 07/06/23.    The ASCVD Risk score (Arnett DK, et al., 2019) failed to calculate for the following reasons:   Risk score cannot be calculated because patient has a medical history suggesting prior/existing ASCVD    Assessment & Plan:  Screening for diabetes mellitus Assessment & Plan: Reports her blood sugars 140 to the other day when somebody checked it and she had not eaten all day.  She does have hypertension which is a risk factor for diabetes.  Will check A1c  Orders: -  Hemoglobin A1c  Anxiety -     clonazePAM ; Take 1 tablet (0.5 mg total) by mouth 2 (two) times daily as needed for anxiety.  Dispense: 30 tablet; Refill: 0  Essential hypertension -     CBC with Differential/Platelet -     Comprehensive metabolic panel with GFR -     TSH + free T4 -     CBC with Differential/Platelet -     Comprehensive metabolic panel with GFR -     TSH  COPD with acute exacerbation (HCC) Assessment & Plan: She is not wheezing in the office even the forced expiration.  She describes mucopurulent sputum.  Gets hives to doxycycline .  Will give her a Z-Pak.  Orders: -     Azithromycin ; Take 2 tablets on day 1, then 1 tablet daily on days 2 through 5  Dispense: 6 tablet; Refill: 0  Familial hypercholesterolemia -     Rosuvastatin  Calcium ; Take 1 tablet (40 mg total) by mouth daily.  Dispense: 90 tablet; Refill: 1 -     Lipid panel -     Lipid  panel  Gastroesophageal reflux disease, unspecified whether esophagitis present -     Famotidine ; Take 1 tablet (40 mg total) by mouth at bedtime.  Dispense: 60 tablet; Refill: 3 -     Omeprazole ; TAKE 1 CAPSULE BY MOUTH EVERY DAY  Dispense: 30 capsule; Refill: 2  Thrush Assessment & Plan: Reports she gets thrush even if she brushes her teeth and rinses her mouth after using budesonide .  Small amount of nystatin  keeps her from developing thrush.  Orders: -     Nystatin ; Take 5 mLs (500,000 Units total) by mouth 4 (four) times daily.  Dispense: 60 mL; Refill: 0  Restless legs -     Gabapentin ; Take 1 capsule (300 mg total) by mouth at bedtime.  Dispense: 30 capsule; Refill: 1 -     Pramipexole  Dihydrochloride; Take 1 tablet (1 mg total) by mouth at bedtime.  Dispense: 90 tablet; Refill: 0  Nausea -     Promethazine  HCl; Take 1 tablet (25 mg total) by mouth every 4 (four) hours as needed.  Dispense: 30 tablet; Refill: 0  Generalized anxiety disorder Assessment & Plan: Last fill of clonazepam  0.5 #30 was 03/23/2023 will refill clonazepam  No. 30 with no refills.      Return in about 3 months (around 10/06/2023).    Pahoua Schreiner K Divya Munshi, MD

## 2023-07-06 NOTE — Assessment & Plan Note (Signed)
 Reports she gets thrush even if she brushes her teeth and rinses her mouth after using budesonide .  Small amount of nystatin  keeps her from developing thrush.

## 2023-07-06 NOTE — Assessment & Plan Note (Signed)
 She is not wheezing in the office even the forced expiration.  She describes mucopurulent sputum.  Gets hives to doxycycline .  Will give her a Z-Pak.

## 2023-07-06 NOTE — Assessment & Plan Note (Signed)
 Reports her blood sugars 140 to the other day when somebody checked it and she had not eaten all day.  She does have hypertension which is a risk factor for diabetes.  Will check A1c

## 2023-07-07 ENCOUNTER — Ambulatory Visit: Payer: Self-pay | Admitting: Family Medicine

## 2023-07-07 LAB — CBC WITH DIFFERENTIAL/PLATELET
Basophils Absolute: 0.1 10*3/uL (ref 0.0–0.2)
Basos: 1 %
EOS (ABSOLUTE): 0.3 10*3/uL (ref 0.0–0.4)
Eos: 2 %
Hematocrit: 38.5 % (ref 34.0–46.6)
Hemoglobin: 12.9 g/dL (ref 11.1–15.9)
Immature Grans (Abs): 0 10*3/uL (ref 0.0–0.1)
Immature Granulocytes: 0 %
Lymphocytes Absolute: 3.6 10*3/uL — ABNORMAL HIGH (ref 0.7–3.1)
Lymphs: 29 %
MCH: 32.1 pg (ref 26.6–33.0)
MCHC: 33.5 g/dL (ref 31.5–35.7)
MCV: 96 fL (ref 79–97)
Monocytes Absolute: 0.9 10*3/uL (ref 0.1–0.9)
Monocytes: 7 %
Neutrophils Absolute: 7.5 10*3/uL — ABNORMAL HIGH (ref 1.4–7.0)
Neutrophils: 61 %
Platelets: 227 10*3/uL (ref 150–450)
RBC: 4.02 x10E6/uL (ref 3.77–5.28)
RDW: 13.1 % (ref 11.7–15.4)
WBC: 12.4 10*3/uL — ABNORMAL HIGH (ref 3.4–10.8)

## 2023-07-07 LAB — COMPREHENSIVE METABOLIC PANEL WITH GFR
ALT: 15 IU/L (ref 0–32)
AST: 23 IU/L (ref 0–40)
Albumin: 3.6 g/dL — ABNORMAL LOW (ref 3.8–4.9)
Alkaline Phosphatase: 90 IU/L (ref 44–121)
BUN/Creatinine Ratio: 12 (ref 9–23)
BUN: 10 mg/dL (ref 6–24)
Bilirubin Total: <0.2 mg/dL (ref 0.0–1.2)
CO2: 19 mmol/L — ABNORMAL LOW (ref 20–29)
Calcium: 9.3 mg/dL (ref 8.7–10.2)
Chloride: 110 mmol/L — ABNORMAL HIGH (ref 96–106)
Creatinine, Ser: 0.81 mg/dL (ref 0.57–1.00)
Globulin, Total: 2.6 g/dL (ref 1.5–4.5)
Glucose: 95 mg/dL (ref 70–99)
Potassium: 4 mmol/L (ref 3.5–5.2)
Sodium: 142 mmol/L (ref 134–144)
Total Protein: 6.2 g/dL (ref 6.0–8.5)
eGFR: 85 mL/min/{1.73_m2} (ref >59)

## 2023-07-07 LAB — LIPID PANEL
Chol/HDL Ratio: 2.5 ratio (ref 0.0–4.4)
Cholesterol, Total: 134 mg/dL (ref 100–199)
HDL: 53 mg/dL (ref >39)
LDL Chol Calc (NIH): 66 mg/dL (ref 0–99)
Triglycerides: 75 mg/dL (ref 0–149)
VLDL Cholesterol Cal: 15 mg/dL (ref 5–40)

## 2023-07-07 LAB — HEMOGLOBIN A1C
Est. average glucose Bld gHb Est-mCnc: 114 mg/dL
Hgb A1c MFr Bld: 5.6 % (ref 4.8–5.6)

## 2023-07-07 LAB — TSH+FREE T4
Free T4: 1.21 ng/dL (ref 0.82–1.77)
TSH: 0.795 u[IU]/mL (ref 0.450–4.500)

## 2023-07-07 NOTE — Telephone Encounter (Signed)
 Already responded to by another mode.

## 2023-07-29 ENCOUNTER — Encounter: Payer: Self-pay | Admitting: Internal Medicine

## 2023-07-29 ENCOUNTER — Ambulatory Visit: Payer: Self-pay | Admitting: Internal Medicine

## 2023-07-29 ENCOUNTER — Ambulatory Visit

## 2023-07-29 ENCOUNTER — Other Ambulatory Visit
Admission: RE | Admit: 2023-07-29 | Discharge: 2023-07-29 | Disposition: A | Discharge status: Home / Self Care | Source: Ambulatory Visit | Attending: Internal Medicine | Admitting: Internal Medicine

## 2023-07-29 ENCOUNTER — Ambulatory Visit: Attending: Internal Medicine | Admitting: Internal Medicine

## 2023-07-29 VITALS — BP 120/62 | HR 62 | Ht 63.0 in | Wt 118.4 lb

## 2023-07-29 DIAGNOSIS — R079 Chest pain, unspecified: Secondary | ICD-10-CM

## 2023-07-29 DIAGNOSIS — I10 Essential (primary) hypertension: Secondary | ICD-10-CM

## 2023-07-29 DIAGNOSIS — R002 Palpitations: Secondary | ICD-10-CM | POA: Diagnosis not present

## 2023-07-29 DIAGNOSIS — R55 Syncope and collapse: Secondary | ICD-10-CM

## 2023-07-29 DIAGNOSIS — I25118 Atherosclerotic heart disease of native coronary artery with other forms of angina pectoris: Secondary | ICD-10-CM

## 2023-07-29 DIAGNOSIS — Z79899 Other long term (current) drug therapy: Secondary | ICD-10-CM | POA: Diagnosis not present

## 2023-07-29 DIAGNOSIS — R0602 Shortness of breath: Secondary | ICD-10-CM | POA: Insufficient documentation

## 2023-07-29 DIAGNOSIS — E7801 Familial hypercholesterolemia: Secondary | ICD-10-CM

## 2023-07-29 LAB — COMPREHENSIVE METABOLIC PANEL WITH GFR
ALT: 19 U/L (ref 0–44)
AST: 26 U/L (ref 15–41)
Albumin: 4.3 g/dL (ref 3.5–5.0)
Alkaline Phosphatase: 78 U/L (ref 38–126)
Anion gap: 8 (ref 5–15)
BUN: 16 mg/dL (ref 6–20)
CO2: 22 mmol/L (ref 22–32)
Calcium: 9.8 mg/dL (ref 8.9–10.3)
Chloride: 110 mmol/L (ref 98–111)
Creatinine, Ser: 0.92 mg/dL (ref 0.44–1.00)
GFR, Estimated: >60 mL/min (ref >60)
Glucose, Bld: 112 mg/dL — ABNORMAL HIGH (ref 70–99)
Potassium: 3.7 mmol/L (ref 3.5–5.1)
Sodium: 140 mmol/L (ref 135–145)
Total Bilirubin: 0.5 mg/dL (ref 0.0–1.2)
Total Protein: 8 g/dL (ref 6.5–8.1)

## 2023-07-29 LAB — CBC WITH DIFFERENTIAL/PLATELET
Abs Immature Granulocytes: 0.02 10*3/uL (ref 0.00–0.07)
Basophils Absolute: 0.1 10*3/uL (ref 0.0–0.1)
Basophils Relative: 1 %
Eosinophils Absolute: 0.2 10*3/uL (ref 0.0–0.5)
Eosinophils Relative: 2 %
HCT: 41.5 % (ref 36.0–46.0)
Hemoglobin: 14.3 g/dL (ref 12.0–15.0)
Immature Granulocytes: 0 %
Lymphocytes Relative: 33 %
Lymphs Abs: 3.5 10*3/uL (ref 0.7–4.0)
MCH: 31.4 pg (ref 26.0–34.0)
MCHC: 34.5 g/dL (ref 30.0–36.0)
MCV: 91 fL (ref 80.0–100.0)
Monocytes Absolute: 0.6 10*3/uL (ref 0.1–1.0)
Monocytes Relative: 6 %
Neutro Abs: 6.1 10*3/uL (ref 1.7–7.7)
Neutrophils Relative %: 58 %
Platelets: 263 10*3/uL (ref 150–400)
RBC: 4.56 MIL/uL (ref 3.87–5.11)
RDW: 13.5 % (ref 11.5–15.5)
WBC: 10.5 10*3/uL (ref 4.0–10.5)
nRBC: 0 % (ref 0.0–0.2)

## 2023-07-29 LAB — D-DIMER, QUANTITATIVE: D-Dimer, Quant: <0.27 ug{FEU}/mL (ref 0.00–0.50)

## 2023-07-29 LAB — LIPASE, BLOOD: Lipase: 32 U/L (ref 11–51)

## 2023-07-29 MED ORDER — DILTIAZEM HCL ER COATED BEADS 240 MG PO CP24: 240.0000 mg | ORAL_CAPSULE | Freq: Every day | ORAL | 3 refills | Status: AC

## 2023-07-29 MED ORDER — METOPROLOL TARTRATE 25 MG PO TABS: 25.0000 mg | ORAL_TABLET | Freq: Two times a day (BID) | ORAL | 3 refills | Status: AC

## 2023-07-29 NOTE — Patient Instructions (Signed)
 Medication Instructions:  Your physician recommends the following medication changes.  DECREASE: Metoprolol  to 25 mg by mouth twice a day  *If you need a refill on your cardiac medications before your next appointment, please call your pharmacy*  Lab Work: Your provider would like for you to have following labs drawn today (CBC w diff, CMP Lipase, D-Dimer), .     Testing/Procedures: Your physician has recommended that you wear a 14 day Zio AT Live monitor.   This monitor is a medical device that records the heart's electrical activity. Doctors most often use these monitors to diagnose arrhythmias. Arrhythmias are problems with the speed or rhythm of the heartbeat. The monitor is a small device applied to your chest. You can wear one while you do your normal daily activities. While wearing this monitor if you have any symptoms to push the button and record what you felt. Once you have worn this monitor for the period of time provider prescribed (Usually 14 days), you will return the monitor device in the postage paid box/bag. Once it is returned they will download the data collected and provide us  with a report which the provider will then review and we will call you with those results.   Important tips:  Avoid showering during the first 24 hours of wearing the monitor. Avoid excessive sweating to help maximize wear time. Do not submerge the device, no hot tubs, and no swimming pools. Keep any lotions or oils away from the patch. After 24 hours you may shower with the patch on. Take brief showers with your back facing the shower head.  Do not remove patch once it has been placed because that will interrupt data and decrease adhesive wear time. Push the button when you have any symptoms and write down what you were feeling. Once you have completed wearing your monitor, remove and place into box which has postage paid and place in your outgoing mailbox.  If for some reason you have misplaced  your box then call our office and we can provide another box and/or mail it off for you. Keep the transmitter within 10 feet at all times.  Expect a welcome phone call within 24-48 hrs of application from Zio.  This call will include your copay information, so please answer any unknown phone calls while wearing Zio (it could also be important information about your heart) The envelope to return Zio is in the back of the transmitter. Removal instructions are on the last page of the symptom diary.  Place the patch sticky side up inside the transmitter and the symptom diary inside the envelope to return on your last wear day inside your mailbox or any USPS mailbox.   Follow-Up: At [facility], you and your health needs are our priority.  As part of our continuing mission to provide you with exceptional heart care, our providers are all part of one team.  This team includes your primary Cardiologist (physician) and Advanced Practice Providers or APPs (Physician Assistants and Nurse Practitioners) who all work together to provide you with the care you need, when you need it.  Your next appointment:   1 month(s)  Provider:   You may see Lonni Hanson, MD or one of the following Advanced Practice Providers on your designated Care Team:   Lonni Meager, NP Lesley Maffucci, PA-C Bernardino Bring, PA-C Cadence [place], PA-C Tylene Lunch, NP Barnie Hila, NP

## 2023-07-29 NOTE — Progress Notes (Signed)
 Cardiology Office Note:  .   Date:  07/29/2023  ID:  Kimberly Kemp, DOB 12/23/1965, MRN [id number] PCP: Ziglar, Susan K, MD  [facility] Providers Cardiologist:  Lonni Hanson, MD     History of Present Illness: .   Kimberly Kemp is a 58 y.o. 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  I last saw her in 12/2022, with Kimberly Kemp having canceled or missed multiple subsequent appointments.  At that time, she was feeling fairly well after having been seen in the ED in 11/2022 with sudden chest pain.  ED workup at that time was unremarkable.  We did not make any medication changes or pursue additional testing.  Today, Kimberly Kemp has several concerns.  She reports that she has been blacking out over the last month.  She reports multiple episodes of suddenly becoming lightheaded and passing out, sometimes remaining unconscious for up to 10 minutes at a time.  There are no prodromal symptoms.  She also notes occasional palpitations with associated chest pain and shortness of breath.  These are not associated with a forementioned syncope, having happened just 3 times over the last month.  She reports having a burning sensation in the left upper chest as well.  Notably, she has been trying to exercise some and does not have any exertional symptoms.  She also denies fevers, chills, cough, and edema.  She reports being compliant with her medications.  ROS: See HPI  Studies Reviewed: SABRA   EKG Interpretation Date/Time:  Wednesday July 29 2023 10:29:34 EDT Ventricular Rate:  62 PR Interval:  218 QRS Duration:  96 QT  Interval:  438 QTC Calculation: 444 R Axis:   1  Text Interpretation: Sinus rhythm with 1st degree A-V block Minimal voltage criteria for LVH, may be normal variant ( Cornell product ) Possible Inferior infarct Abnormal ECG When compared with ECG of 20-May-2023 20:23, PR interval has increased Vent. rate has decreased BY  46 BPM Confirmed by Ramy Greth [phone]) on 07/29/2023 12:55:09 PM    Pharmacologic MPI (12/09/2022): Intermediate risk study with inferior scar and possible peri-infarct ischemia.  Risk Assessment/Calculations:         Physical Exam:   VS:  BP 120/62 (BP Location: Left Arm, Patient Position: Sitting, Cuff Size: Normal)   Pulse 62   Ht 5' 3 (1.6 m)   Wt 118 lb 6.4 oz (53.7 kg)   LMP 07/18/2016   SpO2 97%   BMI 20.97 kg/m    Wt Readings from Last 3 Encounters:  07/29/23 118 lb 6.4 oz (53.7 kg)  07/06/23 124 lb (56.2 kg)  06/01/23 124 lb (56.2 kg)    General:  NAD. Neck: No JVD or HJR. Lungs: Clear to auscultation bilaterally without wheezes or crackles. Heart: Regular rate and rhythm without murmurs, rubs, or gallops. Abdomen: Diffuse tenderness, most pronounced in epigastrum.  Voluntary guarding noted. Extremities: No lower extremity edema.  ASSESSMENT AND PLAN: .    Syncope: Kimberly Kemp reports recurrent syncopal episodes with very brief lightheadedness before passing out.  This is certainly concerning for potential arrhythmia, including transient high-grade AV block or sinus node arrest.  Her EKG today is notable for slight PR prolongation.  I have recommended reducing metoprolol  tartrate to 25 mg daily.  Will continue to diltiazem  240 mg daily for now and place a 14-day event monitor (ZIO AT) for further evaluation.  I will also check labs today including a CBC, CMP, and D-dimer.  I have advised Kimberly Kemp from driving and operating heavy machinery or performing other tasks where sudden lightheadedness/syncope could lead to serious injury.  I advised her to  call 911 for any further syncopal episodes.  Palpitations, chest pain, and shortness of breath: Kimberly Kemp has had longstanding palpitations with prior event monitor showing brief episodes of PSVT.  These have been relatively well-controlled with diltiazem  and metoprolol .  However, given recrudescence of symptoms as well as aforementioned syncope and mild PR prolongation today, we have agreed to reduce her metoprolol  and place an event monitor.  Based on results, we may need to consult electrophysiology if symptoms persist and event monitor is unrevealing.  Coronary artery disease and hyperlipidemia: No exertional angina reported.  MPI in 12/2022 showed predominantly fixed inferior defect consistent with iatrogenic occlusion of PDA.  May need to consider repeat ischemia evaluation if significant arrhythmias identified.  Continue secondary prevention for now with aspirin  and rosuvastatin .  Abdominal tenderness: Kimberly Kemp did not report any frank abdominal pain though is notably tender in the epigastrium (less so in other portions of the abdomen).  She does not have any nausea, diarrhea, or melena/rectal bleeding.  I will check a CBC, CMP, and lipase today.  If workup is unrevealing and pain tenderness persists, further workup (potentially including imaging) would need to be considered by her PCP.   Dispo: Return to clinic in 1 month.  Signed, Lonni Hanson, MD

## 2023-08-02 DIAGNOSIS — R55 Syncope and collapse: Secondary | ICD-10-CM | POA: Diagnosis not present

## 2023-08-03 NOTE — Telephone Encounter (Signed)
Patient is returning phone call. Please advise.

## 2023-08-28 DIAGNOSIS — R55 Syncope and collapse: Secondary | ICD-10-CM | POA: Diagnosis not present

## 2023-09-09 NOTE — Telephone Encounter (Signed)
 SABRA

## 2023-09-11 ENCOUNTER — Ambulatory Visit: Admitting: Nurse Practitioner

## 2023-09-22 ENCOUNTER — Ambulatory Visit: Payer: Self-pay

## 2023-09-22 ENCOUNTER — Telehealth: Payer: Self-pay

## 2023-09-22 NOTE — Telephone Encounter (Signed)
 Pt states that her nebulizer broke on yesterday and since she can't complete her breathing treatments her SOB has  gotten a little worse. Pt is wanting a new order for a nebulizer sent in. Offered pt appt for today for worsen SOB, pt denied and states that she has not way and just wants to know can the order be sent for her machine. NT also instructed pt to reach out to her Pulmonologist, pt states she has been trying and can't get anyone.    Please advise

## 2023-09-22 NOTE — Telephone Encounter (Signed)
 Called patient to inform her that order for nebulizer was sent to [facility]. Was unable to reach patient nor leave VM.

## 2023-09-22 NOTE — Telephone Encounter (Signed)
 Order for nebulizer has been placed with [facility] through [place].

## 2023-09-22 NOTE — Telephone Encounter (Signed)
 Copied from CRM #[id number]. Topic: Clinical - Order For Equipment >> Sep 22, 2023 10:37 AM Winona R wrote: Pt would like assistance with getting a new breathing treatment system as her system is not working.

## 2023-09-22 NOTE — Telephone Encounter (Signed)
 FYI Only or Action Required?: Action required by provider: medication refill request.  Patient was last seen in primary care on 07/06/2023 by Ziglar, Susan K, MD.  Called Nurse Triage reporting Cough.  Symptoms began yesterday.  Interventions attempted: Nothing.  Symptoms are: gradually worsening.  Triage Disposition: See HCP Within 4 Hours (Or PCP Triage)  Patient/caregiver understands and will follow disposition?: No, wishes to speak with PCP       Copied from CRM #[id number]. Topic: Clinical - Red Word Triage >> Sep 22, 2023 10:40 AM Winona R wrote: Pt with COPD having chest pain and cough. Breathing machine is broken and can not take her ipratropium-albuterol  (DUONEB) 0.5-2.5 (3) MG/3ML SOLN nor her budesonide  (PULMICORT ) 0.25 MG/2ML nebulizer solution a CRM has been sent for equipment order Reason for Disposition  [1] MILD difficulty breathing (e.g., minimal/no SOB at rest, SOB with walking, pulse < 100) AND [2] NEW-onset or WORSE than normal  Answer Assessment - Initial Assessment Questions 1. RESPIRATORY STATUS: Describe your breathing? (e.g., wheezing, shortness of breath, unable to speak, severe coughing)      sob 2. ONSET: When did this breathing problem begin?      It starts everyday  3. PATTERN Does the difficult breathing come and go, or has it been constant since it started?      Comes and goes 4. SEVERITY: How bad is your breathing? (e.g., mild, moderate, severe)      Mild or 4/10 5. RECURRENT SYMPTOM: Have you had difficulty breathing before? If Yes, ask: When was the last time? and What happened that time?      yes 6. CARDIAC HISTORY: Do you have any history of heart disease? (e.g., heart attack, angina, bypass surgery, angioplasty)      no 7. LUNG HISTORY: Do you have any history of lung disease?  (e.g., pulmonary embolus, asthma, emphysema)     copd 8. CAUSE: What do you think is causing the breathing problem?      Nebulizer machine not  working 9. OTHER SYMPTOMS: Do you have any other symptoms? (e.g., chest pain, cough, dizziness, fever, runny nose)     cough 10. O2 SATURATION MONITOR:  Do you use an oxygen saturation monitor (pulse oximeter) at home? If Yes, ask: What is your reading (oxygen level) today? What is your usual oxygen saturation reading? (e.g., 95%)       no  Protocols used: Breathing Difficulty-A-AH

## 2023-09-23 ENCOUNTER — Emergency Department

## 2023-09-23 ENCOUNTER — Emergency Department
Admission: EM | Admit: 2023-09-23 | Discharge: 2023-09-23 | Disposition: A | Discharge status: Home / Self Care | Source: Ambulatory Visit | Attending: Emergency Medicine | Admitting: Emergency Medicine

## 2023-09-23 ENCOUNTER — Other Ambulatory Visit: Payer: Self-pay

## 2023-09-23 ENCOUNTER — Encounter: Payer: Self-pay | Admitting: Emergency Medicine

## 2023-09-23 DIAGNOSIS — I2510 Atherosclerotic heart disease of native coronary artery without angina pectoris: Secondary | ICD-10-CM | POA: Insufficient documentation

## 2023-09-23 DIAGNOSIS — J441 Chronic obstructive pulmonary disease with (acute) exacerbation: Secondary | ICD-10-CM | POA: Insufficient documentation

## 2023-09-23 DIAGNOSIS — J439 Emphysema, unspecified: Secondary | ICD-10-CM | POA: Diagnosis not present

## 2023-09-23 DIAGNOSIS — E876 Hypokalemia: Secondary | ICD-10-CM | POA: Insufficient documentation

## 2023-09-23 DIAGNOSIS — R0602 Shortness of breath: Secondary | ICD-10-CM | POA: Diagnosis not present

## 2023-09-23 DIAGNOSIS — D72829 Elevated white blood cell count, unspecified: Secondary | ICD-10-CM | POA: Insufficient documentation

## 2023-09-23 LAB — BASIC METABOLIC PANEL WITH GFR
Anion gap: 12 (ref 5–15)
BUN: 15 mg/dL (ref 6–20)
CO2: 19 mmol/L — ABNORMAL LOW (ref 22–32)
Calcium: 9.4 mg/dL (ref 8.9–10.3)
Chloride: 109 mmol/L (ref 98–111)
Creatinine, Ser: 1.08 mg/dL — ABNORMAL HIGH (ref 0.44–1.00)
GFR, Estimated: 60 mL/min — ABNORMAL LOW (ref >60)
Glucose, Bld: 110 mg/dL — ABNORMAL HIGH (ref 70–99)
Potassium: 3.2 mmol/L — ABNORMAL LOW (ref 3.5–5.1)
Sodium: 140 mmol/L (ref 135–145)

## 2023-09-23 LAB — CBC
HCT: 40.2 % (ref 36.0–46.0)
Hemoglobin: 13.8 g/dL (ref 12.0–15.0)
MCH: 31.1 pg (ref 26.0–34.0)
MCHC: 34.3 g/dL (ref 30.0–36.0)
MCV: 90.5 fL (ref 80.0–100.0)
Platelets: 258 K/uL (ref 150–400)
RBC: 4.44 MIL/uL (ref 3.87–5.11)
RDW: 13.5 % (ref 11.5–15.5)
WBC: 13.4 K/uL — ABNORMAL HIGH (ref 4.0–10.5)
nRBC: 0 % (ref 0.0–0.2)

## 2023-09-23 LAB — TROPONIN I (HIGH SENSITIVITY): Troponin I (High Sensitivity): 8 ng/L (ref <18)

## 2023-09-23 MED ORDER — IPRATROPIUM-ALBUTEROL 0.5-2.5 (3) MG/3ML IN SOLN
RESPIRATORY_TRACT | Status: AC
  Administered 2023-09-23: 3 mL via RESPIRATORY_TRACT
  Filled 2023-09-23: qty 3

## 2023-09-23 MED ORDER — IPRATROPIUM-ALBUTEROL 0.5-2.5 (3) MG/3ML IN SOLN
3.0000 mL | Freq: Once | RESPIRATORY_TRACT | Status: AC
  Administered 2023-09-23: 3 mL via RESPIRATORY_TRACT
  Filled 2023-09-23: qty 3

## 2023-09-23 MED ORDER — METHYLPREDNISOLONE SODIUM SUCC 125 MG IJ SOLR
125.0000 mg | Freq: Once | INTRAMUSCULAR | Status: AC
  Administered 2023-09-23: 125 mg via INTRAVENOUS
  Filled 2023-09-23: qty 2

## 2023-09-23 MED ORDER — MAGNESIUM SULFATE 2 GM/50ML IV SOLN
2.0000 g | Freq: Once | INTRAVENOUS | Status: AC
  Administered 2023-09-23: 2 g via INTRAVENOUS
  Filled 2023-09-23: qty 50

## 2023-09-23 MED ORDER — IPRATROPIUM-ALBUTEROL 0.5-2.5 (3) MG/3ML IN SOLN
3.0000 mL | Freq: Once | RESPIRATORY_TRACT | Status: AC
  Filled 2023-09-23: qty 3

## 2023-09-23 MED ORDER — PREDNISONE 50 MG PO TABS: 50.0000 mg | ORAL_TABLET | Freq: Every day | ORAL | 0 refills | Status: DC

## 2023-09-23 NOTE — ED Triage Notes (Addendum)
 Patient to ED via POV from [facility] for SOB x3 days. PT unable to speak in full sentences. States her nebulizer has been broke x3 days. Hx COPD.

## 2023-09-23 NOTE — ED Provider Notes (Signed)
 [facility] Provider Note    Event Date/Time   First MD Initiated Contact with Patient 09/23/23 1825     (approximate)   History   Shortness of Breath  HPI  Kimberly Kemp is a 58 y.o. female with history of COPD, CAD who reports she ran out of her inhaler and has had worsening shortness of breath over the last 2 days.  No fevers reported     Physical Exam   Triage Vital Signs: ED Triage Vitals  Encounter Vitals Group     BP 09/23/23 1814 (!) 152/96     Girls Systolic BP Percentile --      Girls Diastolic BP Percentile --      Boys Systolic BP Percentile --      Boys Diastolic BP Percentile --      Pulse Rate 09/23/23 1814 (!) 109     Resp 09/23/23 1814 (!) 24     Temp 09/23/23 1809 98.5 F (36.9 C)     Temp Source 09/23/23 1809 Oral     SpO2 09/23/23 1814 100 %     Weight 09/23/23 1810 45.4 kg (100 lb)     Height 09/23/23 1810 1.6 m (5' 3)     Head Circumference --      Peak Flow --      Pain Score 09/23/23 1810 9     Pain Loc --      Pain Education --      Exclude from Growth Chart --     Most recent vital signs: Vitals:   09/23/23 1814 09/23/23 1930  BP: (!) 152/96 127/75  Pulse: (!) 109 82  Resp: (!) 24 16  Temp:    SpO2: 100% 100%     General: Awake, no distress.  CV:  Good peripheral perfusion.  Resp:  Tachypnea, diffuse wheezing Abd:  No distention.  Other:  No calf pain or swelling   ED Results / Procedures / Treatments   Labs (all labs ordered are listed, but only abnormal results are displayed) Labs Reviewed  BASIC METABOLIC PANEL WITH GFR - Abnormal; Notable for the following components:      Result Value   Potassium 3.2 (*)    CO2 19 (*)    Glucose, Bld 110 (*)    Creatinine, Ser 1.08 (*)    GFR, Estimated 60 (*)    All other components within normal limits  CBC - Abnormal; Notable for the following components:   WBC 13.4 (*)    All other components within normal limits  TROPONIN I (HIGH SENSITIVITY)      EKG  ED ECG REPORT I, Lamar Price, the attending physician, personally viewed and interpreted this ECG.  Date: 09/23/2023  Rhythm: Sinus tach QRS Axis: normal Intervals: normal ST/T Wave abnormalities: normal Narrative Interpretation: no evidence of acute ischemia    RADIOLOGY Chest x-ray viewed interpreted by me, no pneumonia    PROCEDURES:  Critical Care performed:   Procedures   MEDICATIONS ORDERED IN ED: Medications  ipratropium-albuterol  (DUONEB) 0.5-2.5 (3) MG/3ML nebulizer solution 3 mL (3 mLs Nebulization Given 09/23/23 1835)  ipratropium-albuterol  (DUONEB) 0.5-2.5 (3) MG/3ML nebulizer solution 3 mL (3 mLs Nebulization Given 09/23/23 1834)  methylPREDNISolone  sodium succinate (SOLU-MEDROL ) 125 mg/2 mL injection 125 mg (125 mg Intravenous Given 09/23/23 1835)  magnesium  sulfate IVPB 2 g 50 mL (0 g Intravenous Stopped 09/23/23 1944)     IMPRESSION / MDM / ASSESSMENT AND PLAN / ED COURSE  I reviewed  the triage vital signs and the nursing notes. Patient's presentation is most consistent with severe exacerbation of chronic illness.  Patient presents with shortness of breath, she is wheezing diffusely.  Most consistent with COPD exacerbation, differential includes pneumonia.  Will treat with DuoNebs, IV magnesium , IV Solu-Medrol , will obtain chest x-ray, labs, closely monitor.  Chest x-ray, lab work is reassuring, mild elevation of white blood cell count is nonspecific, no signs of infection  She is markedly improved after treatment, no further wheezing, she has inhaler at home, is pending new nebulizer from her PCP.  Will start on prednisone , strict return precautions, she agrees with this plan      FINAL CLINICAL IMPRESSION(S) / ED DIAGNOSES   Final diagnoses:  COPD exacerbation (HCC)     Rx / DC Orders   ED Discharge Orders          Ordered    predniSONE  (DELTASONE ) 50 MG tablet  Daily with breakfast        09/23/23 1940              Note:  This document was prepared using Dragon voice recognition software and may include unintentional dictation errors.   Arlander Charleston, MD 09/23/23 2130

## 2023-09-24 ENCOUNTER — Ambulatory Visit: Admitting: Nurse Practitioner

## 2023-09-24 ENCOUNTER — Telehealth: Payer: Self-pay | Admitting: Family Medicine

## 2023-09-24 NOTE — Telephone Encounter (Signed)
 Left message requesting a return call. [facility] sent an update through [place].  [facility] 8/20 ? 12:42PM Held for Copay: Order for [employer] for [employer] with Mask + 4 other items cannot be delivered - patient copay is needed  Order #[id number]

## 2023-09-24 NOTE — Telephone Encounter (Signed)
 Copied from CRM #[id number]. Topic: Clinical - Medication Question >> Sep 24, 2023  9:49 AM Nathanel BROCKS wrote: Reason for CRM: Please advise when nebulizer will be to the pt. She was in the ER last night and can't breathe. Please call [phone]

## 2023-09-24 NOTE — Telephone Encounter (Signed)
 Pt called and needs status of when the nebulizer will be to her. She went to the ER yesterday and will be going back today if she doesn't get it. She is struggling to breath. Please call and check on this.

## 2023-09-24 NOTE — Telephone Encounter (Unsigned)
 Copied from CRM [id number]. Topic: Clinical - Medical Advice >> Sep 24, 2023  1:01 PM Tiffini S wrote: Reason for CRM: Patient is returning a call about nebulizer- called [facility], said that [employer] have left for the day. Please call the patient at [phone]/ cell phone number is not working.  [facility] Held for Copay: Order for [employer] for [employer] with Mask + 4 other items cannot be delivered - patient copay is needed/ offered to transfer call to triage nurse- patient declined.

## 2023-09-25 ENCOUNTER — Ambulatory Visit: Payer: Self-pay

## 2023-09-25 NOTE — Telephone Encounter (Signed)
 FYI Only or Action Required?: FYI only for provider.  Patient was last seen in primary care on 07/06/2023 by Kimberly Kemp, Kimberly K, MD.  Called Nurse Triage reporting Shortness of Breath.  Symptoms began today. Similar episode 2 days ago, seen in ED. Pt says symptoms returned this morning  Interventions attempted: Nothing.  Symptoms are: rapidly worsening.  Triage Disposition: See HCP Within 4 Hours (Or PCP Triage)  Patient/caregiver understands and will follow disposition?: Yes Patient called to obtain contact information to [employer]. Phone number provided ([phone]). Pt says that she will still go to the ED today.  Copied from CRM [id number]. Topic: Clinical - Red Word Triage >> Sep 25, 2023 11:59 AM [name] wrote: [facility] that prompted transfer to Nurse Triage: Nebulizer broke. Pt having SOB and coughing. Went to ER yesterday. Nebulizer is on hold due to copay. Warm transfer to [facility] nurse triage. Reason for Disposition  [1] Longstanding difficulty breathing (e.g., CHF, COPD, emphysema) AND [2] WORSE than normal  Answer Assessment - Initial Assessment Questions 1. RESPIRATORY STATUS: Describe your breathing? (e.g., wheezing, shortness of breath, unable to speak, severe coughing)      Shortness of breath  2. ONSET: When did this breathing problem begin?      Started again today, last episode  3. PATTERN Does the difficult breathing come and go, or has it been constant since it started?      Constant, progressing  4. SEVERITY: How bad is your breathing? (e.g., mild, moderate, severe)      Moderate to severe  5. RECURRENT SYMPTOM: Have you had difficulty breathing before? If Yes, ask: When was the last time? and What happened that time?      Yes,   6. CARDIAC HISTORY: Do you have any history of heart disease? (e.g., heart attack, angina, bypass surgery, angioplasty)      No  7. LUNG HISTORY: Do you have any history of lung disease?  (e.g., pulmonary embolus,  asthma, emphysema)     COPD  8. CAUSE: What do you think is causing the breathing problem?  COPD exacerbation  9. OTHER SYMPTOMS: Do you have any other symptoms? (e.g., chest pain, cough, dizziness, fever, runny nose)     Unk  10. O2 SATURATION MONITOR:  Do you use an oxygen saturation monitor (pulse oximeter) at home? If Yes, ask: What is your reading (oxygen level) today? What is your usual oxygen saturation reading? (e.g., 95%)       Unknown  11. PREGNANCY: Is there any chance you are pregnant? When was your last menstrual period?       Monitor  12. TRAVEL: Have you traveled out of the country in the last month? (e.g., travel history, exposures)       no  Protocols used: Breathing Difficulty-A-AH

## 2023-09-25 NOTE — Telephone Encounter (Signed)
 Called patient and left a detailed VM message stating she will need to contact [employer] [facility] to discuss copay. Patient will need to call [phone] to discuss with company.

## 2023-09-25 NOTE — Telephone Encounter (Unsigned)
 Copied from CRM [id number]. Topic: Clinical - Medical Advice >> Sep 24, 2023  1:01 PM Tiffini S wrote: Reason for CRM: Patient is returning a call about nebulizer- called [facility], said that [employer] have left for the day. Please call the patient at [phone]/ cell phone number is not working.  [facility] Held for Copay: Order for [employer] for [employer] with Mask + 4 other items cannot be delivered - patient copay is needed/ offered to transfer call to triage nurse- patient declined. >> Sep 24, 2023  3:56 PM Deleta S wrote: Patient would like to know how much the copayment will be for nebulizer. Please contact the patient regarding the price and concerns [phone]

## 2023-09-25 NOTE — Telephone Encounter (Signed)
 Copied from CRM #[id number]. Topic: Clinical - Medication Question >> Sep 24, 2023  9:49 AM Nathanel BROCKS wrote: Reason for CRM: Please advise when nebulizer will be to the pt. She was in the ER last night and can't breathe. Please call [phone]

## 2023-09-26 ENCOUNTER — Other Ambulatory Visit: Payer: Self-pay | Admitting: Primary Care

## 2023-09-29 ENCOUNTER — Ambulatory Visit: Attending: Physician Assistant | Admitting: Physician Assistant

## 2023-09-29 NOTE — Progress Notes (Deleted)
 Cardiology Office Note    Date:  09/29/2023   ID:  Kimberly Kemp, Kimberly Kemp 08/29/1965, MRN [id number]  PCP:  Ziglar, Susan K, MD  Cardiologist:  Lonni Hanson, MD  Electrophysiologist:  None   Chief Complaint: Follow-up  History of Present Illness:   Kimberly Kemp is a 58 y.o. female with history of ***  ***   Labs independently reviewed: ***  Past Medical History:  Diagnosis Date   ? h/o Paroxysmal atrial fibrillation (HCC)    ? h/o Pulmonary embolism (HCC)    Anxiety    Asthma    Atypical chest pain    a. 09/2018 ETT: poor exercise tolerance; b. 11/2018 Dobutamine  Echo: No wall motion abnormalities, EF 55-60%.   Bronchitis    hx of bronchitis   CAD (coronary artery disease)    a. 02/2019 Cath/PCI: LM nl, LAD nl, LCX nl, RCA 4m (attempted iFR->wire dissection-->3.0x38 Resolute Onyx DES prox, 2.75x38 Resolute Onyx DES mid), RPDA jailed by stent. EF 65%; b. 11/2019 MV: EF 55-65%. medium-sized apical defect - likely diaph atten. No ischemia-->low risk.   Carotid arterial disease (HCC)    a. 03/2021 U/S: RICA 1-39%, LICA 1-39%. Bilat ECA <50%.   Chronic pain    Cirrhosis (HCC)    Colon cancer (HCC)    COPD (chronic obstructive pulmonary disease) (HCC)    COPD (chronic obstructive pulmonary disease) (HCC)    emphysema. current smoker   Coronary artery disease    has 2 stents   COVID-19 virus infection 11/2019   Depression    Elevated liver enzymes    Emphysema    GERD (gastroesophageal reflux disease)    Heart murmur    Hepatitis B    History of echocardiogram    a. 02/2019 Echo: EF 55-60%. No rwma. No significant valvular disease.   Hyperlipidemia    Hypertension    MI (myocardial infarction) [facility])    states has had several MI's   PSVT (paroxysmal supraventricular tachycardia) (HCC)    a. 02/2019 Zio: Sinus, avg 83 (55-135). Rare PACs/PVCs. Four atrial runs, longest 7 beats, fastest 146 bpm. Triggered event->sinus rhythm & artifact; b. 05/2022 Zio: Predominantly sinus  rhythm at 73 (50-121).  Rare PACs/PVCs.  2 brief SVT runs.  Brief Mobitz 1.  No signift arrhyth or prolonged pauses. Triggered events = sinus, PACs, and PVCs.   Stroke [facility])    Tobacco abuse     Past Surgical History:  Procedure Laterality Date   APPENDECTOMY     microscopic    APPENDECTOMY     BRONCHOSCOPY  March 2008, Feb 2012   CARDIAC CATHETERIZATION     CESAREAN SECTION     CORONARY PRESSURE/FFR STUDY N/A 02/22/2019   Procedure: INTRAVASCULAR PRESSURE WIRE/FFR STUDY;  Surgeon: Hanson Lonni, MD;  Location: [facility] INVASIVE CV LAB;  Service: Cardiovascular;  Laterality: N/A;   CORONARY STENT INTERVENTION N/A 02/22/2019   Procedure: CORONARY STENT INTERVENTION;  Surgeon: Hanson Lonni, MD;  Location: [facility] INVASIVE CV LAB;  Service: Cardiovascular;  Laterality: N/A;   RIGHT/LEFT HEART CATH AND CORONARY ANGIOGRAPHY N/A 02/22/2019   Procedure: RIGHT/LEFT HEART CATH AND CORONARY ANGIOGRAPHY;  Surgeon: Hanson Lonni, MD;  Location: [facility] INVASIVE CV LAB;  Service: Cardiovascular;  Laterality: N/A;    Current Medications: No outpatient medications have been marked as taking for the 09/29/23 encounter (Appointment) with Abigail Bernardino HERO, PA-C.    Allergies:   Penicillins, Doxycycline , Penicillins, Omnipaque  [iohexol ], Red dye, and Red dye #40 (allura red)   Social  History   Socioeconomic History   Marital status: Legally Separated    Spouse name: Not on file   Number of children: 4   Years of education: Not on file   Highest education level: Not on file  Occupational History    Employer: UNEMPLOYED    Comment: on disability  Tobacco Use   Smoking status: Every Day    Current packs/day: 0.50    Average packs/day: 0.5 packs/day for 50.6 years (25.3 ttl pk-yrs)    Types: Cigarettes    Start date: 02/27/1973    Passive exposure: Current   Smokeless tobacco: Never   Tobacco comments:    5 per day-06/06/2021  Vaping Use   Vaping status: Never Used  Substance and Sexual Activity    Alcohol use: Not Currently   Drug use: Never   Sexual activity: Not on file  Other Topics Concern   Not on file  Social History Narrative   ** Merged History Encounter **       Lives in [place] with husband and daughter, on disability. Grandchildren visit daily.   Social Drivers of Corporate investment banker Strain: Not on file  Food Insecurity: Not on file  Transportation Needs: Not on file  Physical Activity: Not on file  Stress: Not on file  Social Connections: Not on file     Family History:  The patient's family history includes Diabetes in her mother; Emphysema in her father and maternal grandmother; Heart disease (age of onset: 23) in her mother; Liver disease in her father; Prostate cancer in her father. There is no history of Colon cancer.  ROS:   12-point review of systems is negative unless otherwise noted in the HPI.   EKGs/Labs/Other Studies Reviewed:    Studies reviewed were summarized above. The additional studies were reviewed today:  Zio patch 07/2023:   The patient was monitored for 11 days, 9 hours; 10 days, 4 hours were suitable for analysis after removal of artifact.   The predominant rhythm was sinus with an average rate of 70 bpm (range 43-134 bpm).  First-degree AV block was noted.   There were rare PACs and PVCs.   Three supraventricular runs occurred, lasting up to 6 beats with a maximum rate of 125 bpm.   No sustained arrhythmia or prolonged pause was seen.   Patient triggered events correspond predominantly to sinus rhythm, though a few events also contain isolated PACs.   Predominantly sinus rhythm with first-degree AV block.  Rare PACs and PVCs noted as well as a few brief supraventricular runs. __________  Lexiscan  MPI 12/09/2022:   Findings are consistent with infarction with peri-infarct ischemia. The study is intermediate risk.   No ST deviation was noted.   Left ventricular function is normal. Nuclear stress EF: 64%. The left  ventricular ejection fraction is normal (55-65%). End diastolic cavity size is normal. End systolic cavity size is normal.   Prior study available for comparison. No changes compared to prior study.   There is an inferior infarct pattern.  Though this may represent artifact in the setting of study quality, presence of wall motion abnormalities favor Prior RCA infarct with small amount of peri-infarct ischemia. Moderate risk study. __________  Zio patch 05/2022:   The patient was monitored for 13 days, 7 hours.   The predominant rhythm was sinus with an average rate of 73 bpm (range 50-121 bpm in sinus).   There were rare PAC's and PVC's.   Two supraventricular runs occurred, lasting  up to 9 beats with a maximum rate of 150 bpm.   A brief episode of Mobitz type 1 second segree AV block was observed.   There were no sustained arrhythmias or prolonged pauses.   Patient triggered events correspond to sinus rhythm, PAC's and PVC's (including ventricular bigeminy).   Predominantly sinus rhythm with rare PAC's and PVC's.  Two brief supraventricular runs were observed as well as transient Mobitz type 1 second degree AV block. __________  EKG:  EKG is ordered today.  The EKG ordered today demonstrates ***  Recent Labs: 11/27/2022: B Natriuretic Peptide 29.8 07/06/2023: TSH 0.795 07/29/2023: ALT 19 09/23/2023: BUN 15; Creatinine, Ser 1.08; Hemoglobin 13.8; Platelets 258; Potassium 3.2; Sodium 140  Recent Lipid Panel    Component Value Date/Time   CHOL 134 07/06/2023 1638   TRIG 75 07/06/2023 1638   HDL 53 07/06/2023 1638   CHOLHDL 2.5 07/06/2023 1638   CHOLHDL 2.2 05/23/2022 1226   VLDL 18 05/23/2022 1226   LDLCALC 66 07/06/2023 1638   LDLDIRECT 90 05/23/2022 1226    PHYSICAL EXAM:    VS:  LMP 07/18/2016   BMI: There is no height or weight on file to calculate BMI.  Physical Exam  Wt Readings from Last 3 Encounters:  09/23/23 100 lb (45.4 kg)  07/29/23 118 lb 6.4 oz (53.7 kg)   07/06/23 124 lb (56.2 kg)     ASSESSMENT & PLAN:   ***   {Are you ordering a CV Procedure (e.g. stress test, cath, DCCV, TEE, etc)?   Press F2        :[id number]}     Disposition: F/u with Dr. Mady or an APP in ***.   Medication Adjustments/Labs and Tests Ordered: Current medicines are reviewed at length with the patient today.  Concerns regarding medicines are outlined above. Medication changes, Labs and Tests ordered today are summarized above and listed in the Patient Instructions accessible in Encounters.   Signed, Bernardino Bring, PA-C 09/29/2023 7:04 AM     [facility] [place] [address] [place], [place] [address] [phone]

## 2023-09-30 ENCOUNTER — Other Ambulatory Visit: Payer: Self-pay | Admitting: Family Medicine

## 2023-09-30 DIAGNOSIS — G2581 Restless legs syndrome: Secondary | ICD-10-CM

## 2023-09-30 DIAGNOSIS — K219 Gastro-esophageal reflux disease without esophagitis: Secondary | ICD-10-CM

## 2023-10-01 ENCOUNTER — Other Ambulatory Visit: Payer: Self-pay | Admitting: Primary Care

## 2023-10-02 ENCOUNTER — Encounter: Payer: Self-pay | Admitting: Family Medicine

## 2023-10-02 ENCOUNTER — Ambulatory Visit: Admitting: Family Medicine

## 2023-10-02 VITALS — BP 158/70 | HR 70 | Resp 16 | Ht 63.0 in | Wt 119.0 lb

## 2023-10-02 DIAGNOSIS — F419 Anxiety disorder, unspecified: Secondary | ICD-10-CM

## 2023-10-02 DIAGNOSIS — J441 Chronic obstructive pulmonary disease with (acute) exacerbation: Secondary | ICD-10-CM | POA: Diagnosis not present

## 2023-10-02 DIAGNOSIS — R110 Nausea: Secondary | ICD-10-CM

## 2023-10-02 MED ORDER — CLONAZEPAM 0.5 MG PO TABS: 0.5000 mg | ORAL_TABLET | Freq: Two times a day (BID) | ORAL | 1 refills | Status: DC | PRN

## 2023-10-02 MED ORDER — PROMETHAZINE HCL 25 MG PO TABS
25.0000 mg | ORAL_TABLET | ORAL | 0 refills | Status: AC | PRN
Start: 2023-10-02 — End: ?

## 2023-10-02 MED ORDER — PREDNISONE 10 MG (48) PO TBPK: ORAL_TABLET | Freq: Every day | ORAL | 0 refills | Status: DC

## 2023-10-02 MED ORDER — AZITHROMYCIN 250 MG PO TABS: ORAL_TABLET | ORAL | 0 refills | Status: AC

## 2023-10-02 NOTE — Assessment & Plan Note (Signed)
 Steroid dosepack taper over 12 days.  Azithromycin .  If you are not improving or feel that you are SOB return to clinic

## 2023-10-02 NOTE — Progress Notes (Signed)
 Established Patient Office Visit  Subjective   Patient ID: Kimberly Kemp, female    DOB: 1965-09-10  Age: 58 y.o. MRN: [id number]  Chief Complaint  Patient presents with   Cough    Stuffy nose and non-productive cough x 2 weeks.     HPI delightful 58 year old woman with COPD/emphysema, (followed at [facility] pulmonary), continued nicotine addiction, GERD, GAD, HTN, CAD (s/p PCI to RCA, iatrogenic RCA dissection 02/22/2019, 12/09/2018 for nuclear medicine stress test: Inferior infarct and small amount of peri-infarct ischemia, followed by Dr. Mady), ischemic cardiomyopathy, PVD (subclavian artery stenosis), mixed hyperlipidemia, CVA, questionable PE and atrial fibrillation, hepatitis B, Hx de Quervain's tenosynovitis.   Discussed the use of AI scribe software for clinical note transcription with the patient, who gave verbal consent to proceed.  History of Present Illness   Kimberly Kemp is a 58 year old female with hypertension and respiratory issues who presents with cough and sore throat.  She has been experiencing a cough for the past two weeks, which began after her daughter and grandkids were ill. The cough is minimally productive with yellowish-green sputum when present. She denies fever and shortness of breath during activities. She has been using Ribatussin DM without significant relief.  She reports a sore throat with pain described as located down in the sternum. She has not experienced any chest pain until the onset of her current symptoms.  Her current medications include Pulmicort  and budesonide  nebulizers twice a day, DuoNeb every four to six hours, and Singulair  at bedtime. She is also taking Cardizem  CD for blood pressure management, with home readings usually around 124/65 mmHg, though higher during office visits.  She has reduced her smoking to four cigarettes a day over the past two weeks. She received a pneumonia shot a year ago and has not yet received the shingles vaccine.  She requests refills for her nausea medication, Promethazine , and her nerve medication, Klonopin .  She does not have her own car and relies on others for transportation, which has caused issues with attending appointments.     ROS    Objective:     BP (!) 158/70   Pulse 70   Resp 16   Ht 5' 3 (1.6 m)   Wt 119 lb (54 kg)   LMP 07/18/2016   SpO2 96%   BMI 21.08 kg/m    Physical Exam Vitals and nursing note reviewed.  Constitutional:      Appearance: Normal appearance.  HENT:     Head: Normocephalic and atraumatic.  Eyes:     Conjunctiva/sclera: Conjunctivae normal.  Cardiovascular:     Rate and Rhythm: Normal rate and regular rhythm.  Pulmonary:     Effort: Pulmonary effort is normal.     Breath sounds: Normal breath sounds.  Musculoskeletal:     Right lower leg: No edema.     Left lower leg: No edema.  Skin:    General: Skin is warm and dry.  Neurological:     Mental Status: She is alert and oriented to person, place, and time.  Psychiatric:        Mood and Affect: Mood normal.        Behavior: Behavior normal.        Thought Content: Thought content normal.        Judgment: Judgment normal.          No results found for any visits on 10/02/23.    The ASCVD Risk score (Arnett DK, et al.,  2019) failed to calculate for the following reasons:   Risk score cannot be calculated because patient has a medical history suggesting prior/existing ASCVD    Assessment & Plan:  COPD with acute exacerbation (HCC) -     predniSONE ; Take by mouth daily. 12-day taper pack, use as directed for taper  Dispense: 1 tablet; Refill: 0 -     Azithromycin ; Take 2 tablets on day 1, then 1 tablet daily on days 2 through 5  Dispense: 6 tablet; Refill: 0  Anxiety -     clonazePAM ; Take 1 tablet (0.5 mg total) by mouth 2 (two) times daily as needed for anxiety.  Dispense: 30 tablet; Refill: 1  Nausea -     Promethazine  HCl; Take 1 tablet (25 mg total) by mouth every 4 (four)  hours as needed.  Dispense: 30 tablet; Refill: 0  COPD exacerbation (HCC) Assessment & Plan: Steroid dosepack taper over 12 days.  Azithromycin .  If you are not improving or feel that you are SOB return to clinic      Return if symptoms worsen or fail to improve.    Toba Claudio K Dylin Breeden, MD

## 2023-10-07 ENCOUNTER — Ambulatory Visit: Admitting: Internal Medicine

## 2023-10-09 ENCOUNTER — Ambulatory Visit: Admitting: Family Medicine

## 2023-10-15 ENCOUNTER — Other Ambulatory Visit: Payer: Self-pay | Admitting: Internal Medicine

## 2023-10-15 ENCOUNTER — Telehealth: Payer: Self-pay | Admitting: Family Medicine

## 2023-10-15 DIAGNOSIS — E7801 Familial hypercholesterolemia: Secondary | ICD-10-CM

## 2023-10-15 NOTE — Telephone Encounter (Signed)
 Copied from CRM [id number]. Topic: Clinical - Medication Question >> Oct 15, 2023  4:16 PM Nathanel BROCKS wrote: Reason for CRM: pt is calling about a cold on top of a virus. Pt got an antibiotic and has completed round and is requesting another dose/round of it. Please advise pt.

## 2023-10-16 ENCOUNTER — Telehealth: Payer: Self-pay | Admitting: Internal Medicine

## 2023-10-16 NOTE — Telephone Encounter (Signed)
*  STAT* If patient is at the pharmacy, call can be transferred to refill team.   1. Which medications need to be refilled? (please list name of each medication and dose if known)  ezetimibe  (ZETIA ) 10 MG tablet    2. Which pharmacy/location (including street and city if local pharmacy) is medication to be sent to? [id number] - [place], [place] - [address]    3. Do they need a 30 day or 90 day supply?  90 day supply

## 2023-10-16 NOTE — Telephone Encounter (Signed)
 RX sent in

## 2023-10-17 ENCOUNTER — Emergency Department
Admission: EM | Admit: 2023-10-17 | Discharge: 2023-10-17 | Disposition: A | Discharge status: Home / Self Care | Attending: Emergency Medicine | Admitting: Emergency Medicine

## 2023-10-17 ENCOUNTER — Other Ambulatory Visit: Payer: Self-pay

## 2023-10-17 ENCOUNTER — Emergency Department

## 2023-10-17 DIAGNOSIS — J449 Chronic obstructive pulmonary disease, unspecified: Secondary | ICD-10-CM | POA: Diagnosis not present

## 2023-10-17 DIAGNOSIS — S6990XA Unspecified injury of unspecified wrist, hand and finger(s), initial encounter: Secondary | ICD-10-CM | POA: Diagnosis not present

## 2023-10-17 DIAGNOSIS — I2510 Atherosclerotic heart disease of native coronary artery without angina pectoris: Secondary | ICD-10-CM | POA: Insufficient documentation

## 2023-10-17 DIAGNOSIS — W228XXA Striking against or struck by other objects, initial encounter: Secondary | ICD-10-CM | POA: Diagnosis not present

## 2023-10-17 DIAGNOSIS — S62336A Displaced fracture of neck of fifth metacarpal bone, right hand, initial encounter for closed fracture: Secondary | ICD-10-CM | POA: Insufficient documentation

## 2023-10-17 DIAGNOSIS — S6991XA Unspecified injury of right wrist, hand and finger(s), initial encounter: Secondary | ICD-10-CM | POA: Diagnosis present

## 2023-10-17 MED ORDER — OXYCODONE-ACETAMINOPHEN 5-325 MG PO TABS: 1.0000 | ORAL_TABLET | Freq: Four times a day (QID) | ORAL | 0 refills | Status: AC | PRN

## 2023-10-17 MED ORDER — OXYCODONE HCL 5 MG PO TABS
5.0000 mg | ORAL_TABLET | Freq: Once | ORAL | Status: AC
  Administered 2023-10-17: 5 mg via ORAL
  Filled 2023-10-17: qty 1

## 2023-10-17 MED ORDER — IBUPROFEN 800 MG PO TABS: 800.0000 mg | ORAL_TABLET | Freq: Once | ORAL | Status: DC

## 2023-10-17 MED ORDER — ACETAMINOPHEN 325 MG PO TABS
650.0000 mg | ORAL_TABLET | Freq: Once | ORAL | Status: AC
  Administered 2023-10-17: 650 mg via ORAL
  Filled 2023-10-17: qty 2
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
## 2023-10-17 NOTE — Discharge Instructions (Addendum)
 The x-ray of your hand showed that you broke your fifth metacarpal.  You will need to follow-up with orthopedics whose information is attached.  Please call to schedule.  Please keep the splint clean and dry.  Try to keep your arm elevated as this will decrease your swelling and improve your pain.  You can take 650 mg of Tylenol  every 6 hours as needed for pain. You can use ice, heat, muscle creams and other topical pain relievers as well.  I have sent a strong pain medication called Percocet to the pharmacy.  This medication can be taken every 4-6 hours as needed for severe or breakthrough pain.  This medication can cause dependency so only take if you are unable to control your pain with other medications.  It will make you sleepy so do not drive or operate heavy machinery after taking it.  Do not drink alcohol while taking this medication.  This medication can also cause constipation so please take an over-the-counter stool softener like MiraLAX or Colace while taking it.  This medication contains both oxycodone  and acetaminophen .  Do not take Tylenol  at the same time.  You can take the Percocet or Tylenol  but not both.
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
## 2023-10-21 DIAGNOSIS — J9600 Acute respiratory failure, unspecified whether with hypoxia or hypercapnia: Secondary | ICD-10-CM | POA: Diagnosis not present

## 2023-10-21 DIAGNOSIS — J441 Chronic obstructive pulmonary disease with (acute) exacerbation: Secondary | ICD-10-CM | POA: Diagnosis not present

## 2023-10-23 ENCOUNTER — Other Ambulatory Visit: Payer: Self-pay | Admitting: Primary Care

## 2023-11-24 ENCOUNTER — Ambulatory Visit: Payer: Self-pay

## 2023-11-24 NOTE — Telephone Encounter (Signed)
 FYI Only or Action Required?: Action required by provider: update on patient condition.  Patient was last seen in primary care on 10/02/2023 by Ziglar, Susan K, MD.  Called Nurse Triage reporting Sinusitis and Chest Pain.  Symptoms began several weeks ago.  Interventions attempted: Prescription medications: ASA, Nitro, tylenol  and Rest, hydration, or home remedies.  Symptoms are: gradually worsening.  Triage Disposition: Go to ED Now (Notify PCP)  Patient/caregiver understands and will follow disposition?: Yes  Copied from CRM [id number]. Topic: Clinical - Red Word Triage >> Nov 24, 2023  4:43 PM Kimberly Kemp wrote: Real bad extreme headache, Yellow mucus, congestion, vomited yesterday Reason for Disposition  SEVERE chest pain  Answer Assessment - Initial Assessment Questions 3 weeks of sinus pain congestion, bad headache, yellow mucous and nausea and vomiting, chest congestion and cough  Hx of migraines and COPD  Robitussin DM- not helping Tylenol - not staying down  Nausea and vomiting- only can keep fluids down.  Generalized weakness.  Chest pain off and on- see CP protocol  1. LOCATION: Where does it hurt?      Forehead back to her neck 2. ONSET: When did the sinus pain start?  (e.g., hours, days)      3 weeks ago  3. SEVERITY: How bad is the pain?   (Scale 0-10; or none, mild, moderate or severe)     Worst migraine you can have 4. RECURRENT SYMPTOM: Have you ever had sinus problems before? If Yes, ask: When was the last time? and What happened that time?      Usually sick this time of year 5. NASAL CONGESTION: Is the nose blocked? If Yes, ask: Can you open it or must you breathe through your mouth?     In morning and evening, completely stopped up  6. NASAL DISCHARGE: Do you have discharge from your nose? If so ask, What color?     Thick and yellow mucous 7. FEVER: Do you have a fever? If Yes, ask: What is it, how was it measured, and when did it  start?      Denies checking but does wake up soaked in the morning like she had a fever overnight and broke.  8. OTHER SYMPTOMS: Do you have any other symptoms? (e.g., sore throat, cough, earache, difficulty breathing)     Itchy throat, bad headache, yellow mucous, congestion, nausea  Answer Assessment - Initial Assessment Questions Daily baby ASA Around the time she started to get sick she developed chest pain episodes. Only lasting a few seconds long. They have been getting more frequent to a couple times a day. She has radiating pain down her left arm. History of multiple cardiac co morbidities. Does not have pulse ox at home due to ABG/VBG readings abnormal from pulse ox which reads normal.  Advised patient to go to the ED for evaluation of chest pain, dizziness, congestion, and resp sx. She is exhausted and tired of being sick. She has someone who will bring her in.   1. LOCATION: Where does it hurt?       Under left breast  2. RADIATION: Does the pain go anywhere else? (e.g., into neck, jaw, arms, back)     Radiates down her left arm  3. ONSET: When did the chest pain begin? (Minutes, hours or days)      About 3 weeks ago- about the time she got sick  4. PATTERN: Does the pain come and go, or has it been constant since it started?  Does it get worse with exertion?      Comes and goes- twice daily  5. DURATION: How long does it last (e.g., seconds, minutes, hours)     Only a few seconds 6. SEVERITY: How bad is the pain?  (e.g., Scale 1-10; mild, moderate, or severe)     8/10- pain when hits- 3 baby ASA with Nitroglycerin - resolves the pain  7. CARDIAC RISK FACTORS: Do you have any history of heart problems or risk factors for heart disease? (e.g., angina, prior heart attack; diabetes, high blood pressure, high cholesterol, smoker, or strong family history of heart disease)     Afib, SVT, CAD, stents 8. PULMONARY RISK FACTORS: Do you have any history of lung disease?   (e.g., blood clots in lung, asthma, emphysema, birth control pills)     COPD  9. CAUSE: What do you think is causing the chest pain?     Resp illness 10. OTHER SYMPTOMS: Do you have any other symptoms? (e.g., dizziness, nausea, vomiting, sweating, fever, difficulty breathing, cough)       Dizziness- light headed  Protocols used: Sinus Pain or Congestion-A-AH, Chest Pain-A-AH

## 2023-12-02 ENCOUNTER — Telehealth: Payer: Self-pay

## 2023-12-02 NOTE — Telephone Encounter (Signed)
 Copied from CRM #[id number]. Topic: Clinical - Medication Question >> Dec 02, 2023  3:12 PM Pinkey ORN wrote: Reason for CRM: Medication Request >> Dec 02, 2023  3:13 PM Pinkey ORN wrote: Patient is calling in, requesting that an antibiotic is called into the pharmacy. Please follow up with patient.

## 2023-12-03 ENCOUNTER — Telehealth: Payer: Self-pay | Admitting: Family Medicine

## 2023-12-03 NOTE — Telephone Encounter (Signed)
 Called patient.  Unable to leave a message.  She has called asking for antibiotics.  She doesn' t say what the problem is.

## 2023-12-04 ENCOUNTER — Ambulatory Visit: Admitting: Family Medicine

## 2023-12-04 ENCOUNTER — Encounter: Payer: Self-pay | Admitting: Family Medicine

## 2023-12-04 VITALS — BP 153/73 | HR 71 | Temp 98.2°F | Resp 18 | Ht 63.0 in | Wt 113.0 lb

## 2023-12-04 DIAGNOSIS — J069 Acute upper respiratory infection, unspecified: Secondary | ICD-10-CM

## 2023-12-04 DIAGNOSIS — J441 Chronic obstructive pulmonary disease with (acute) exacerbation: Secondary | ICD-10-CM | POA: Diagnosis not present

## 2023-12-04 MED ORDER — PREDNISONE 10 MG (48) PO TBPK: ORAL_TABLET | Freq: Every day | ORAL | 0 refills | Status: DC

## 2023-12-04 MED ORDER — SULFAMETHOXAZOLE-TRIMETHOPRIM 800-160 MG PO TABS: 1.0000 | ORAL_TABLET | Freq: Two times a day (BID) | ORAL | 0 refills | Status: DC

## 2023-12-04 NOTE — Progress Notes (Signed)
 Established Patient Office Visit  Subjective   Patient ID: Kimberly Kemp, female    DOB: 01/04/1966  Age: 58 y.o. MRN: [id number]  Chief Complaint  Patient presents with   URI   Cough    Past month. Dark yellow mucus.   Headache    URI  Associated symptoms include coughing and headaches.  Cough Associated symptoms include headaches.  Headache  Associated symptoms include coughing.   Discussed the use of AI scribe software for clinical note transcription with the patient, who gave verbal consent to proceed.  History of Present Illness   Kimberly Kemp is a 58 year old female who presents with respiratory symptoms and a recent hand injury.  She has been experiencing respiratory symptoms for over a month, including thick, dark phlegm, shortness of breath during physical activity, and wheezing, especially during coughing spells. She uses an albuterol  inhaler every four hours, which provides some relief, and a nebulizer with budesonide . She has been taking Robitussin DM but prefers Mucinex  for its stronger effect. She has significantly reduced her smoking. She denies a fever or chills.  She has some shortness of breath with ambulation.  She has a boxer's fracture after hitting a dresser, resulting in significant pain and swelling. A surgeon evaluated the injury and advised that a cast was not necessary due to swelling, but she remains concerned about the lack of a cast.       Objective:     BP (!) 153/73 (BP Location: Left Arm, Patient Position: Sitting, Cuff Size: Normal)   Pulse 71   Temp 98.2 F (36.8 C) (Oral)   Resp 18   Ht 5' 3 (1.6 m)   Wt 113 lb (51.3 kg)   LMP 07/18/2016   SpO2 98%   BMI 20.02 kg/m    Physical Exam Vitals and nursing note reviewed.  Constitutional:      Appearance: Normal appearance.  HENT:     Head: Normocephalic and atraumatic.     Right Ear: Tympanic membrane, ear canal and external ear normal. There is no impacted cerumen.     Left Ear:  Tympanic membrane, ear canal and external ear normal. There is no impacted cerumen.     Nose: Congestion present.     Right Turbinates: Swollen.     Left Turbinates: Swollen.     Mouth/Throat:     Pharynx: Oropharynx is clear. Posterior oropharyngeal erythema present.  Eyes:     Conjunctiva/sclera: Conjunctivae normal.  Cardiovascular:     Rate and Rhythm: Normal rate and regular rhythm.  Pulmonary:     Effort: Pulmonary effort is normal.     Breath sounds: Wheezing present.  Musculoskeletal:     Cervical back: Neck supple. No tenderness.  Lymphadenopathy:     Cervical: No cervical adenopathy.  Skin:    General: Skin is warm and dry.  Neurological:     Mental Status: She is alert and oriented to person, place, and time.  Psychiatric:        Mood and Affect: Mood normal.        Behavior: Behavior normal.        Thought Content: Thought content normal.        Judgment: Judgment normal.          No results found for any visits on 12/04/23.    The ASCVD Risk score (Arnett DK, et al., 2019) failed to calculate for the following reasons:   Risk score cannot be calculated because patient  has a medical history suggesting prior/existing ASCVD    Assessment & Plan:  Upper respiratory tract infection, unspecified type -     predniSONE ; Take by mouth daily. 12-day taper pack, use as directed for taper  Dispense: 1 each; Refill: 0 -     Sulfamethoxazole -Trimethoprim ; Take 1 tablet by mouth 2 (two) times daily.  Dispense: 20 tablet; Refill: 0  COPD exacerbation (HCC) Assessment & Plan: She is having COPD exacerbation.  Prednisone  taper over 12 days and Bactrim  double strength twice daily for 10 days (she is allergic to doxycycline )      Return if symptoms worsen or fail to improve.    Dawnn Nam K Aran Menning, MD

## 2023-12-04 NOTE — Assessment & Plan Note (Signed)
 She is having COPD exacerbation.  Prednisone  taper over 12 days and Bactrim  double strength twice daily for 10 days (she is allergic to doxycycline )

## 2023-12-10 ENCOUNTER — Ambulatory Visit: Payer: Self-pay | Admitting: Primary Care

## 2023-12-10 ENCOUNTER — Other Ambulatory Visit: Payer: Self-pay | Admitting: Primary Care

## 2023-12-10 MED ORDER — ALBUTEROL SULFATE HFA 108 (90 BASE) MCG/ACT IN AERS: 2.0000 | INHALATION_SPRAY | Freq: Four times a day (QID) | RESPIRATORY_TRACT | 0 refills | Status: DC | PRN

## 2023-12-10 MED ORDER — IPRATROPIUM-ALBUTEROL 0.5-2.5 (3) MG/3ML IN SOLN: 3.0000 mL | Freq: Four times a day (QID) | RESPIRATORY_TRACT | 1 refills | Status: AC | PRN

## 2023-12-10 MED ORDER — BUDESONIDE 0.25 MG/2ML IN SUSP: 0.2500 mg | Freq: Two times a day (BID) | RESPIRATORY_TRACT | 0 refills | Status: DC

## 2023-12-10 NOTE — Telephone Encounter (Signed)
 We have not seen patient in 1 year, we will not send in narcotic cough syrup without a visit She needs to establish with any new pulmonary MD first available (former Dr. Shellia patient)

## 2023-12-10 NOTE — Telephone Encounter (Signed)
 ATC patient x1.  LVM to return call.  Per provider, she will need an office to establish care with a new MD (Dr. Shellia is no longer here ) and she has not been seen in 1 year.  Please schedule an appointment.

## 2023-12-10 NOTE — Telephone Encounter (Signed)
 Copied from CRM [id number]. Topic: Clinical - Medication Refill >> Dec 10, 2023  8:27 AM Isabell A wrote: Medication: albuterol  (VENTOLIN  HFA) 108 (90 Base) MCG/ACT inhaler [[id number]]  budesonide  (PULMICORT ) 0.25 MG/2ML nebulizer solution [[id number]]  ipratropium-albuterol  (DUONEB) 0.5-2.5 (3) MG/3ML SOLN [[id number]]  montelukast  (SINGULAIR ) 10 MG tablet [[id number]]    Has the patient contacted their pharmacy? No (Agent: If no, request that the patient contact the pharmacy for the refill. If patient does not wish to contact the pharmacy document the reason why and proceed with request.) (Agent: If yes, when and what did the pharmacy advise?)  This is the patient's preferred pharmacy:  [facility] [id number], [place] - [address] [place] [address] Phone: [phone] Fax: [phone]   Is this the correct pharmacy for this prescription? Yes If no, delete pharmacy and type the correct one.   Has the prescription been filled recently? Yes  Is the patient out of the medication? Yes  Has the patient been seen for an appointment in the last year OR does the patient have an upcoming appointment? Yes  Can we respond through MyChart? No  Agent: Please be advised that Rx refills may take up to 3 business days. We ask that you follow-up with your pharmacy.

## 2023-12-10 NOTE — Telephone Encounter (Signed)
 FYI Only or Action Required?: Action required by provider: update on patient condition and cough syrup request.  Patient is followed in Pulmonology for COPD, last seen on 01/14/2023 by Hope Almarie ORN, NP.  Called Nurse Triage reporting Cough.  Symptoms began several months ago.worsening past few days  Interventions attempted: OTC medications: Mucinex , robitussin DM, Maintenance inhaler, Nebulizer treatments, and Increased fluids/rest.  Symptoms are: gradually worsening.  Triage Disposition: See HCP Within 4 Hours (Or PCP Triage)  Patient/caregiver understands and will follow disposition?: No, wishes to speak with PCP   Message from Isabell A sent at 12/10/2023  8:31 AM EST  Reason for Triage: Patient experiencing a cough and requesting cough medicine - hydrocodone  cough syrup.  Callback number: [phone]   Reason for Disposition  [1] MILD difficulty breathing (e.g., minimal/no SOB at rest, SOB with walking, pulse < 100) AND [2] still present when not coughing  Answer Assessment - Initial Assessment Questions Additional info: Patient called in to request hydrocodone  cough syrup for worsening cough. She is not interested in appointment but willing if necessary, no appointments are available to offer until next week. She is also requesting refills, see refill encounter. Please follow up with patient re: cough medicine, acute evaluation.    [email or web address] Pulmonary Triage - Initial Assessment Questions Chief Complaint (e.g., cough, sob, wheezing, fever, chills, sweat or additional symptoms) *Go to specific symptom protocol after initial questions. Cough   How long have symptoms been present? Couple months but worsening   Have you tested for COVID or Flu? Note: If not, ask patient if a home test can be taken. If so, instruct patient to call back for positive results. Yes, negative results  MEDICINES:   Have you used any OTC meds to help with symptoms? Yes If yes, ask What  medications? Mucinex  dm, Robitussin DM not effective   Have you used your inhalers/maintenance medication? Yes If yes, What medications? As prescribed   If inhaler, ask How many puffs and how often? Note: Review instructions on medication in the chart. Using as prescribed   OXYGEN: Do you wear supplemental oxygen? No If yes, How many liters are you supposed to use?  Do you monitor your oxygen levels? Yes If yes, What is your reading (oxygen level) today? No reading at this time  What is your usual oxygen saturation reading?  (Note: Pulmonary O2 sats should be 90% or greater) 90's  Protocols used: Cough - Acute Productive-A-AH

## 2023-12-11 ENCOUNTER — Encounter: Payer: Self-pay | Admitting: *Deleted

## 2023-12-11 NOTE — Progress Notes (Signed)
 CHANNAH GODEAUX                                          MRN: [id number]   12/11/2023   The [facility] Quality Team Specialist reviewed this patient medical record for the purposes of chart review for care gap closure. The following were reviewed: chart review for care gap closure-breast cancer screening.    [facility] Quality Team

## 2023-12-11 NOTE — Telephone Encounter (Signed)
ATC x2.  LVM to return call.

## 2023-12-14 NOTE — Telephone Encounter (Signed)
 Called the pt and there was no answer- left detailed msg that she needs to call and schedule appt. Closing per protocol.

## 2023-12-15 ENCOUNTER — Ambulatory Visit

## 2023-12-17 ENCOUNTER — Ambulatory Visit

## 2023-12-17 VITALS — Ht 63.0 in | Wt 113.0 lb

## 2023-12-17 DIAGNOSIS — Z0000 Encounter for general adult medical examination without abnormal findings: Secondary | ICD-10-CM

## 2023-12-17 NOTE — Patient Instructions (Signed)
 Kimberly Kemp,  Thank you for taking the time for your Medicare Wellness Visit. I appreciate your continued commitment to your health goals. Please review the care plan we discussed, and feel free to reach out if I can assist you further.  Please note that Annual Wellness Visits do not include a physical exam. Some assessments may be limited, especially if the visit was conducted virtually. If needed, we may recommend an in-person follow-up with your provider.  Ongoing Care Seeing your primary care provider every 3 to 6 months helps us  monitor your health and provide consistent, personalized care.   Referrals If a referral was made during today's visit and you haven't received any updates within two weeks, please contact the referred provider directly to check on the status.  Recommended Screenings:  Health Maintenance  Topic Date Due   COVID-19 Vaccine (1) Never done   Hepatitis B Vaccine (1 of 3 - 19+ 3-dose series) Never done   Zoster (Shingles) Vaccine (1 of 2) Never done   Pap with HPV screening  Never done   Breast Cancer Screening  07/03/2013   Colon Cancer Screening  09/20/2017   Screening for Lung Cancer  05/28/2021   Pneumococcal Vaccine for age over 44 (3 of 3 - PCV20 or PCV21) 10/07/2022   Hepatitis C Screening  03/22/2024*   HIV Screening  03/22/2024*   Flu Shot  05/03/2024*   Medicare Annual Wellness Visit  12/16/2024   DTaP/Tdap/Td vaccine (2 - Td or Tdap) 01/19/2028   HPV Vaccine  Aged Out   Meningitis B Vaccine  Aged Out  *Topic was postponed. The date shown is not the original due date.       12/17/2023    2:41 PM  Advanced Directives  Does Patient Have a Medical Advance Directive? No  Would patient like information on creating a medical advance directive? Yes (MAU/Ambulatory/Procedural Areas - Information given)   Information on Advanced Care Planning can be found at [place]  Secretary of [facility] Advance Health Care Directives Advance Health Care Directives  ([email or web address]   Vision: Annual vision screenings are recommended for early detection of glaucoma, cataracts, and diabetic retinopathy. These exams can also reveal signs of chronic conditions such as diabetes and high blood pressure.  Dental: Annual dental screenings help detect early signs of oral cancer, gum disease, and other conditions linked to overall health, including heart disease and diabetes.  Please see the attached documents for additional preventive care recommendations.

## 2023-12-17 NOTE — Progress Notes (Signed)
 Chief Complaint  Patient presents with   Medicare Wellness     Subjective:   Kimberly Kemp is a 58 y.o. female who presents for a Medicare Annual Wellness Visit.  Allergies (verified) Penicillins, Doxycycline , Penicillins, Omnipaque  [iohexol ], Red dye, and Red dye #40 (allura red)   History: Past Medical History:  Diagnosis Date   ? h/o Paroxysmal atrial fibrillation (HCC)    ? h/o Pulmonary embolism (HCC)    Anxiety    Asthma    Atypical chest pain    a. 09/2018 ETT: poor exercise tolerance; b. 11/2018 Dobutamine  Echo: No wall motion abnormalities, EF 55-60%.   Bronchitis    hx of bronchitis   CAD (coronary artery disease)    a. 02/2019 Cath/PCI: LM nl, LAD nl, LCX nl, RCA 31m (attempted iFR->wire dissection-->3.0x38 Resolute Onyx DES prox, 2.75x38 Resolute Onyx DES mid), RPDA jailed by stent. EF 65%; b. 11/2019 MV: EF 55-65%. medium-sized apical defect - likely diaph atten. No ischemia-->low risk.   Carotid arterial disease    a. 03/2021 [place]: RICA 1-39%, LICA 1-39%. Bilat ECA <50%.   Chronic pain    Cirrhosis (HCC)    Colon cancer (HCC)    COPD (chronic obstructive pulmonary disease) (HCC)    COPD (chronic obstructive pulmonary disease) (HCC)    emphysema. current smoker   Coronary artery disease    has 2 stents   COVID-19 virus infection 11/2019   Depression    Elevated liver enzymes    Emphysema    GERD (gastroesophageal reflux disease)    Heart murmur    Hepatitis B    History of echocardiogram    a. 02/2019 Echo: EF 55-60%. No rwma. No significant valvular disease.   Hyperlipidemia    Hypertension    MI (myocardial infarction) (HCC)    states has had several MI's   PSVT (paroxysmal supraventricular tachycardia)    a. 02/2019 Zio: Sinus, avg 83 (55-135). Rare PACs/PVCs. Four atrial runs, longest 7 beats, fastest 146 bpm. Triggered event->sinus rhythm & artifact; b. 05/2022 Zio: Predominantly sinus rhythm at 73 (50-121).  Rare PACs/PVCs.  2 brief SVT runs.  Brief  Mobitz 1.  No signift arrhyth or prolonged pauses. Triggered events = sinus, PACs, and PVCs.   Stroke [facility])    Tobacco abuse    Past Surgical History:  Procedure Laterality Date   APPENDECTOMY     microscopic    APPENDECTOMY     BRONCHOSCOPY  March 2008, Feb 2012   CARDIAC CATHETERIZATION     CESAREAN SECTION     CORONARY PRESSURE/FFR STUDY N/A 02/22/2019   Procedure: INTRAVASCULAR PRESSURE WIRE/FFR STUDY;  Surgeon: Mady Bruckner, MD;  Location: [facility] INVASIVE CV LAB;  Service: Cardiovascular;  Laterality: N/A;   CORONARY STENT INTERVENTION N/A 02/22/2019   Procedure: CORONARY STENT INTERVENTION;  Surgeon: Mady Bruckner, MD;  Location: [facility] INVASIVE CV LAB;  Service: Cardiovascular;  Laterality: N/A;   RIGHT/LEFT HEART CATH AND CORONARY ANGIOGRAPHY N/A 02/22/2019   Procedure: RIGHT/LEFT HEART CATH AND CORONARY ANGIOGRAPHY;  Surgeon: Mady Bruckner, MD;  Location: [facility] INVASIVE CV LAB;  Service: Cardiovascular;  Laterality: N/A;   Family History  Problem Relation Age of Onset   Emphysema Father    Prostate cancer Father    Liver disease Father    Emphysema Maternal Grandmother    Diabetes Mother    Heart disease Mother 6       CABG and valve replacement   Colon cancer Neg Hx    Social History  Occupational History    Employer: UNEMPLOYED    Comment: on disability  Tobacco Use   Smoking status: Every Day    Current packs/day: 0.50    Average packs/day: 0.5 packs/day for 50.8 years (25.4 ttl pk-yrs)    Types: Cigarettes    Start date: 02/27/1973    Passive exposure: Current   Smokeless tobacco: Never   Tobacco comments:    5 per day-06/06/2021  Vaping Use   Vaping status: Never Used  Substance and Sexual Activity   Alcohol use: Not Currently   Drug use: Never   Sexual activity: Not on file   Tobacco Counseling Ready to quit: Not Answered Counseling given: Not Answered Tobacco comments: 5 per day-06/06/2021  SDOH Screenings   Food Insecurity: No Food Insecurity  (12/17/2023)  Housing: Unknown (12/17/2023)  Transportation Needs: No Transportation Needs (12/17/2023)  Utilities: Not At Risk (12/17/2023)  Depression (PHQ2-9): Low Risk  (12/17/2023)  Physical Activity: Inactive (12/17/2023)  Social Connections: Socially Isolated (12/17/2023)  Stress: No Stress Concern Present (12/17/2023)  Tobacco Use: High Risk (12/17/2023)  Health Literacy: Inadequate Health Literacy (12/17/2023)   See flowsheets for full screening details  Depression Screen PHQ 2 & 9 Depression Scale- Over the past 2 weeks, how often have you been bothered by any of the following problems? Little interest or pleasure in doing things: 0 Feeling down, depressed, or hopeless (PHQ Adolescent also includes...irritable): 0 PHQ-2 Total Score: 0 Trouble falling or staying asleep, or sleeping too much: 0 Feeling tired or having little energy: 2 Poor appetite or overeating (PHQ Adolescent also includes...weight loss): 2 Feeling bad about yourself - or that you are a failure or have let yourself or your family down: 0 Trouble concentrating on things, such as reading the newspaper or watching television (PHQ Adolescent also includes...like school work): 0 Moving or speaking so slowly that other people could have noticed. Or the opposite - being so fidgety or restless that you have been moving around a lot more than usual: 0 Thoughts that you would be better off dead, or of hurting yourself in some way: 0 PHQ-9 Total Score: 4 If you checked off any problems, how difficult have these problems made it for you to do your work, take care of things at home, or get along with other people?: Not difficult at all     Goals Addressed             This Visit's Progress    Improve respiratory health   On track      Visit info / Clinical Intake: Medicare Wellness Visit Type:: Initial Annual Wellness Visit Persons participating in visit:: patient Medicare Wellness Visit Mode:: Telephone If  telephone:: video declined Because this visit was a virtual/telehealth visit:: vitals recorded from last visit If Telephone or Video please confirm:: I connected with the patient using audio enabled telemedicine application and verified that I am speaking with the correct person using two identifiers; I discussed the limitations of evaluation and management by telemedicine; The patient expressed understanding and agreed to proceed Patient Location:: home Provider Location:: home office Information given by:: patient Interpreter Needed?: No Pre-visit prep was completed: yes AWV questionnaire completed by patient prior to visit?: no Living arrangements:: with family/others Patient's Overall Health Status Rating: (!) fair Typical amount of pain: some Does pain affect daily life?: no Are you currently prescribed opioids?: no  Dietary Habits and Nutritional Risks How many meals a day?: 2 Eats fruit and vegetables daily?: yes Most meals are obtained  by: having others provide food Diabetic:: no  Functional Status Activities of Daily Living (to include ambulation/medication): Independent Ambulation: Independent Medication Administration: Independent Home Management: Needs assistance (comment) Manage your own finances?: yes Primary transportation is: family/friends Concerns about vision?: no *vision screening is required for WTM* Concerns about hearing?: no  Fall Screening Falls in the past year?: 0 Number of falls in past year: 0 Was there an injury with Fall?: 0 Fall Risk Category Calculator: 0 Patient Fall Risk Level: Low Fall Risk  Fall Risk Patient at Risk for Falls Due to: Impaired mobility Fall risk Follow up: Falls evaluation completed; Education provided; Falls prevention discussed  Home and Transportation Safety: All rugs have non-skid backing?: yes All stairs or steps have railings?: yes Grab bars in the bathtub or shower?: yes Have non-skid surface in bathtub or  shower?: yes Good home lighting?: yes Regular seat belt use?: yes Hospital stays in the last year:: no  Cognitive Assessment Difficulty concentrating, remembering, or making decisions? : no Will 6CIT or Mini Cog be Completed: no 6CIT or Mini Cog Declined: patient declined  Advance Directives (For Healthcare) Does Patient Have a Medical Advance Directive?: No Type of Advance Directive: Living will Would patient like information on creating a medical advance directive?: Yes (MAU/Ambulatory/Procedural Areas - Information given)  Reviewed/Updated  Reviewed/Updated: Reviewed All (Medical, Surgical, Family, Medications, Allergies, Care Teams, Patient Goals)        Objective:    Today's Vitals   12/17/23 1439  Weight: 113 lb (51.3 kg)  Height: 5' 3 (1.6 m)   Body mass index is 20.02 kg/m.  Current Medications (verified) Outpatient Encounter Medications as of 12/17/2023  Medication Sig   acetaminophen -codeine  (TYLENOL  #3) 300-30 MG tablet Take 1-2 tablets by mouth every 4 (four) hours as needed for moderate pain.   albuterol  (VENTOLIN  HFA) 108 (90 Base) MCG/ACT inhaler Inhale 2 puffs into the lungs every 6 (six) hours as needed for wheezing or shortness of breath.   aspirin  EC 81 MG tablet Take 81 mg by mouth daily.   budesonide  (PULMICORT ) 0.25 MG/2ML nebulizer solution Take 2 mLs (0.25 mg total) by nebulization in the morning and at bedtime.   butalbital -acetaminophen -caffeine  (FIORICET , ESGIC ) 50-325-40 MG tablet Take 1 tablet by mouth 3 (three) times daily as needed for headache.   cetirizine (ZYRTEC) 10 MG tablet Take 10 mg by mouth daily.   clonazePAM  (KLONOPIN ) 0.5 MG tablet Take 1 tablet (0.5 mg total) by mouth 2 (two) times daily as needed for anxiety.   diltiazem  (CARDIZEM  CD) 240 MG 24 hr capsule Take 1 capsule (240 mg total) by mouth daily.   ezetimibe  (ZETIA ) 10 MG tablet TAKE 1 TABLET BY MOUTH EVERY DAY   famotidine  (PEPCID ) 40 MG tablet Take 1 tablet (40 mg total)  by mouth at bedtime.   gabapentin  (NEURONTIN ) 300 MG capsule Take 1 capsule (300 mg total) by mouth at bedtime.   ipratropium-albuterol  (DUONEB) 0.5-2.5 (3) MG/3ML SOLN Take 3 mLs by nebulization every 6 (six) hours as needed.   lidocaine  (LIDODERM ) 5 % Place 1 patch onto the skin daily. Remove & Discard patch within 12 hours or as directed by MD   magic mouthwash w/lidocaine  SOLN Take 10 mLs by mouth 4 (four) times daily as needed for mouth pain.   metoprolol  tartrate (LOPRESSOR ) 25 MG tablet Take 1 tablet (25 mg total) by mouth 2 (two) times daily.   montelukast  (SINGULAIR ) 10 MG tablet Take 1 tablet (10 mg total) by mouth at bedtime.  nitroGLYCERIN  (NITROSTAT ) 0.4 MG SL tablet PLACE 1 TABLET UNDER THE TONGUE EVERY 5 (FIVE) MINUTES AS NEEDED FOR CHEST PAIN. MAXIMUM OF 3 DOSES.   nystatin  (MYCOSTATIN ) 100000 UNIT/ML suspension Take 5 mLs (500,000 Units total) by mouth 4 (four) times daily.   Omega-3 Fatty Acids (FISH OIL PO) Take 1 capsule by mouth 2 (two) times daily.   omeprazole  (PRILOSEC) 40 MG capsule TAKE 1 CAPSULE BY MOUTH EVERY DAY   PAZEO 0.7 % SOLN Place 1 drop into both eyes daily.   pramipexole  (MIRAPEX ) 1 MG tablet TAKE 1 TABLET BY MOUTH AT BEDTIME.   predniSONE  (STERAPRED UNI-PAK 48 TAB) 10 MG (48) TBPK tablet Take by mouth daily. 12-day taper pack, use as directed for taper   promethazine  (PHENERGAN ) 25 MG tablet Take 1 tablet (25 mg total) by mouth every 4 (four) hours as needed.   promethazine -dextromethorphan  (PROMETHAZINE -DM) 6.25-15 MG/5ML syrup Take 5 mLs by mouth 4 (four) times daily as needed for cough.   rosuvastatin  (CRESTOR ) 40 MG tablet Take 1 tablet (40 mg total) by mouth daily.   TIADYLT  ER 180 MG 24 hr capsule Take 180 mg by mouth daily.   montelukast  (SINGULAIR ) 10 MG tablet TAKE 1 TABLET BY MOUTH EVERYDAY AT BEDTIME   sulfamethoxazole -trimethoprim  (BACTRIM  DS) 800-160 MG tablet Take 1 tablet by mouth 2 (two) times daily.   No facility-administered encounter  medications on file as of 12/17/2023.   Hearing/Vision screen Hearing Screening - Comments:: Patient is able to hear conversational tones without difficulty. No issues reported.   Vision Screening - Comments:: No vision problems; will schedule routine eye exam Immunizations and Health Maintenance Health Maintenance  Topic Date Due   COVID-19 Vaccine (1) Never done   Hepatitis B Vaccines 19-59 Average Risk (1 of 3 - 19+ 3-dose series) Never done   Zoster Vaccines- Shingrix (1 of 2) Never done   Cervical Cancer Screening (HPV/Pap Cotest)  Never done   Mammogram  07/03/2013   Colonoscopy  09/20/2017   Lung Cancer Screening  05/28/2021   Pneumococcal Vaccine: 50+ Years (3 of 3 - PCV20 or PCV21) 10/07/2022   Hepatitis C Screening  03/22/2024 (Originally 07/19/1983)   HIV Screening  03/22/2024 (Originally 07/18/1980)   Influenza Vaccine  05/03/2024 (Originally 09/04/2023)   Medicare Annual Wellness (AWV)  12/16/2024   DTaP/Tdap/Td (2 - Td or Tdap) 01/19/2028   HPV VACCINES  Aged Out   Meningococcal B Vaccine  Aged Out        Assessment/Plan:  This is a routine wellness examination for [place].  Patient Care Team: Ziglar, Susan K, MD as PCP - General (Family Medicine) End, Lonni, MD as PCP - Cardiology (Cardiology)  I have personally reviewed and noted the following in the patient's chart:   Medical and social history Use of alcohol, tobacco or illicit drugs  Current medications and supplements including opioid prescriptions. Functional ability and status Nutritional status Physical activity Advanced directives List of other physicians Hospitalizations, surgeries, and ER visits in previous 12 months Vitals Screenings to include cognitive, depression, and falls Referrals and appointments  No orders of the defined types were placed in this encounter.  In addition, I have reviewed and discussed with patient certain preventive protocols, quality metrics, and best practice  recommendations. A written personalized care plan for preventive services as well as general preventive health recommendations were provided to patient.   Lavelle Charmaine [place], LPN   88/86/7974   Return in 1 year (on 12/16/2024).  After Visit Summary: (MyChart) Due to  this being a telephonic visit, the after visit summary with patients personalized plan was offered to patient via MyChart   Nurse Notes: Patient continues to have respiratory illness symptoms; scheduled for an office visit 12/18/23

## 2023-12-18 ENCOUNTER — Ambulatory Visit (INDEPENDENT_AMBULATORY_CARE_PROVIDER_SITE_OTHER): Admitting: Family Medicine

## 2023-12-18 ENCOUNTER — Encounter: Payer: Self-pay | Admitting: Family Medicine

## 2023-12-18 ENCOUNTER — Other Ambulatory Visit: Payer: Self-pay | Admitting: Family Medicine

## 2023-12-18 VITALS — BP 113/76 | HR 67 | Temp 98.2°F | Resp 18 | Ht 63.0 in | Wt 108.0 lb

## 2023-12-18 DIAGNOSIS — B370 Candidal stomatitis: Secondary | ICD-10-CM | POA: Diagnosis not present

## 2023-12-18 DIAGNOSIS — J069 Acute upper respiratory infection, unspecified: Secondary | ICD-10-CM | POA: Diagnosis not present

## 2023-12-18 DIAGNOSIS — J441 Chronic obstructive pulmonary disease with (acute) exacerbation: Secondary | ICD-10-CM | POA: Diagnosis not present

## 2023-12-18 MED ORDER — NYSTATIN 100000 UNIT/ML MT SUSP
5.0000 mL | Freq: Four times a day (QID) | OROMUCOSAL | 0 refills | Status: AC
Start: 2023-12-18 — End: ?

## 2023-12-18 MED ORDER — AZITHROMYCIN 250 MG PO TABS: ORAL_TABLET | ORAL | 0 refills | Status: AC

## 2023-12-18 MED ORDER — PREDNISONE 10 MG (48) PO TBPK: ORAL_TABLET | Freq: Every day | ORAL | 0 refills | Status: DC

## 2023-12-18 NOTE — Progress Notes (Signed)
 Established Patient Office Visit  Subjective   Patient ID: Kimberly Kemp, female    DOB: [birth year]  Age: 58 y.o. MRN: [id number]  Chief Complaint  Patient presents with   URI    HPI Kimberly Kemp. [name] is a delightful 57-yo woman with COPD/emphysema (followed by [facility] pulmonology), continued nicotine addiction, GERD, GAD, HTN, CAD (s/p PCI to RCA iatrogenic RCA dissection 02/22/2019, 12/09/2022 nuclear stress test: Inferior infarct and small amount of peri-infarct ischemia, followed by Dr. Mady), ischemic cardiomyopathy, PVD (subclavian artery stenosis), mixed hyperlipidemia, CVA, questionable PE and atrial fibrillation, hepatitis B, Hx de Quervain's tenosynovitis.  She has a boxer's fracture of the fifth metacarpal neck of her right hand   She was seen 12/04/2023 with an exacerbation of COPD.  She was given a prednisone  Dosepak 12 days and Bactrim  DS.  She reports that she is not any better she still has a cough that is productive of dark sputum she denies a fever but she does have sweats.  She reports that she still wheezing despite using her inhalers.  She did complete 12 days of a prednisone  Dosepak.  She last had azithromycin  on 10/07/2023.  She is still smoking.  She complains of shortness of breath with ambulation.       Objective:     BP 113/76 (BP Location: Left Arm, Patient Position: Sitting, Cuff Size: Normal)   Pulse 67   Temp 98.2 F (36.8 C) (Oral)   Resp 18   Ht 5' 3 (1.6 m)   Wt 108 lb (49 kg)   LMP 07/18/2016   SpO2 95%   BMI 19.13 kg/m    Physical Exam Vitals and nursing note reviewed.  Constitutional:      Appearance: Normal appearance.  HENT:     Head: Normocephalic and atraumatic.     Right Ear: Tympanic membrane, ear canal and external ear normal. There is no impacted cerumen.     Left Ear: Tympanic membrane, ear canal and external ear normal. There is no impacted cerumen.     Nose:     Right Turbinates: Swollen.     Left Turbinates: Swollen.      Mouth/Throat:     Pharynx: Oropharynx is clear. Posterior oropharyngeal erythema present.  Eyes:     Conjunctiva/sclera: Conjunctivae normal.  Cardiovascular:     Rate and Rhythm: Normal rate and regular rhythm.  Pulmonary:     Effort: Pulmonary effort is normal. No respiratory distress.     Breath sounds: Wheezing present. No rales.  Musculoskeletal:     Cervical back: Neck supple. No tenderness.     Right lower leg: No edema.     Left lower leg: No edema.  Lymphadenopathy:     Cervical: No cervical adenopathy.  Skin:    General: Skin is warm and dry.  Neurological:     Mental Status: She is alert and oriented to person, place, and time.  Psychiatric:        Mood and Affect: Mood normal.        Behavior: Behavior normal.        Thought Content: Thought content normal.        Judgment: Judgment normal.          No results found for any visits on 12/18/23.    The ASCVD Risk score (Arnett DK, et al., 2019) failed to calculate for the following reasons:   Risk score cannot be calculated because patient has a medical history suggesting prior/existing ASCVD  Assessment & Plan:  COPD exacerbation (HCC) Assessment & Plan: She did not clear with the prednisone  Dosepak and Bactrim  double strength.  Ask her to check a chest x-ray.  12-day steroid Dosepak and azithromycin .  Follow-up if you are not feeling improved otherwise follow-up for your regular scheduled appointment.  Orders: -     Azithromycin ; Take 2 tablets on day 1, then 1 tablet daily on days 2 through 5  Dispense: 6 tablet; Refill: 0 -     DG Chest 2 View; Future  Upper respiratory tract infection, unspecified type -     predniSONE ; Take by mouth daily. 12-day taper pack, use as directed for taper  Dispense: 1 each; Refill: 0  Thrush Assessment & Plan: She has mild thrush now.  Will cover with nystatin  swish and swallow 4 times a day.  Orders: -     Nystatin ; Take 5 mLs (500,000 Units total) by mouth 4  (four) times daily.  Dispense: 60 mL; Refill: 0     Return if symptoms worsen or fail to improve.    Aditri Louischarles K Dotsie Gillette, MD

## 2023-12-20 NOTE — Assessment & Plan Note (Signed)
 She has mild thrush now.  Will cover with nystatin  swish and swallow 4 times a day.

## 2023-12-20 NOTE — Assessment & Plan Note (Signed)
 She did not clear with the prednisone  Dosepak and Bactrim  double strength.  Ask her to check a chest x-ray.  12-day steroid Dosepak and azithromycin .  Follow-up if you are not feeling improved otherwise follow-up for your regular scheduled appointment.

## 2023-12-23 ENCOUNTER — Other Ambulatory Visit: Payer: Self-pay | Admitting: Family Medicine

## 2023-12-23 DIAGNOSIS — B370 Candidal stomatitis: Secondary | ICD-10-CM

## 2023-12-23 DIAGNOSIS — J441 Chronic obstructive pulmonary disease with (acute) exacerbation: Secondary | ICD-10-CM

## 2023-12-27 ENCOUNTER — Other Ambulatory Visit: Payer: Self-pay | Admitting: Family Medicine

## 2023-12-27 DIAGNOSIS — G2581 Restless legs syndrome: Secondary | ICD-10-CM

## 2024-01-08 ENCOUNTER — Other Ambulatory Visit: Payer: Self-pay | Admitting: Family Medicine

## 2024-01-08 DIAGNOSIS — F419 Anxiety disorder, unspecified: Secondary | ICD-10-CM

## 2024-01-10 ENCOUNTER — Telehealth: Payer: Self-pay | Admitting: Pulmonary Disease

## 2024-01-10 NOTE — Telephone Encounter (Signed)
 Patient called answering service  Need is unclear, had questions about refills  Called patient [phone], the call went to voicemail  Advised to call office tomorrow if need is about medication refill, if feeling unwell, may go to an urgent care

## 2024-01-11 ENCOUNTER — Other Ambulatory Visit: Payer: Self-pay | Admitting: Primary Care

## 2024-01-11 ENCOUNTER — Ambulatory Visit: Payer: Self-pay | Admitting: Primary Care

## 2024-01-11 NOTE — Telephone Encounter (Signed)
 Appt made for 1/29

## 2024-01-11 NOTE — Telephone Encounter (Signed)
 FYI Only or Action Required?: FYI only for provider: appointment scheduled on 01/12/2024 at 3 PM.  Patient is followed in Pulmonology for COPD, last seen on 01/14/2023 by Hope Almarie ORN, NP.  Called Nurse Triage reporting Cough.  Symptoms began several days ago.  Interventions attempted: Rescue inhaler and Nebulizer treatments.  Symptoms are: unchanged.  Triage Disposition: See Physician Within 24 Hours  Patient/caregiver understands and will follow disposition?: Yes  E2C2 Pulmonary Triage - Initial Assessment Questions "Chief Complaint (e.g., cough, sob, wheezing, fever, chills, sweat or additional symptoms) *Go to specific symptom protocol after initial questions. Patient reports productive cough, shortness of breath at night along with wheezing. Patient endorses sweating but no fever or chills. Patient states cough involves thick yellow mucus. Reports good use of breathing treatments. Recommended patient to be seen in the office tomorrow. Scheduled patient for 3 PM in pulmonary office tomorrow. Verbalized understanding and questions answered.  "How long have symptoms been present?" 2-3 days ago  Have you tested for COVID or Flu? Note: If not, ask patient if a home test can be taken. If so, instruct patient to call back for positive results. No  MEDICINES:   "Have you used any OTC meds to help with symptoms?" Yes If yes, ask "What medications?" Mucinex  DM Robitussin DM  "Have you used your inhalers/maintenance medication?" Yes If yes, "What medications?" Albuterol  Pulmicort  Duoneb  If inhaler, ask "How many puffs and how often?" Note: Review instructions on medication in the chart. Albuterol  2 Puffs Q6H  OXYGEN: "Do you wear supplemental oxygen?" No If yes, "How many liters are you supposed to use?"   "Do you monitor your oxygen levels?" No If yes, What is your reading (oxygen level) today?   What is your usual oxygen saturation reading?  (Note: Pulmonary O2  sats should be 90% or greater)    Copied from CRM #[id number]. Topic: Clinical - Red Word Triage >> Jan 11, 2024  4:26 PM Rilla B wrote: [facility] that prompted transfer to Nurse Triage: Patient states coughing, yellow phlem, shortness of breath   ----------------------------------------------------------------------- From previous Reason for Contact - Scheduling: Patient/patient representative is calling to schedule an appointment. Refer to attachments for appointment information. Reason for Disposition  SEVERE coughing spells (e.g., whooping sound after coughing, vomiting after coughing)  Answer Assessment - Initial Assessment Questions 1. ONSET: When did the cough begin?      Started 2-3 days ago 2. SEVERITY: How bad is the cough today?      severe 3. SPUTUM: Describe the color of your sputum (e.g., none, dry cough; clear, white, yellow, green)     Yellow thick sputum 4. HEMOPTYSIS: Are you coughing up any blood? If Yes, ask: How much? (e.g., flecks, streaks, tablespoons, etc.)     no 5. DIFFICULTY BREATHING: Are you having difficulty breathing? If Yes, ask: How bad is it? (e.g., mild, moderate, severe)      moderate 6. FEVER: Do you have a fever? If Yes, ask: What is your temperature, how was it measured, and when did it start?     No but reports sweats 7. CARDIAC HISTORY: Do you have any history of heart disease? (e.g., heart attack, congestive heart failure)      yes 8. LUNG HISTORY: Do you have any history of lung disease?  (e.g., pulmonary embolus, asthma, emphysema)     yes 9. PE RISK FACTORS: Do you have a history of blood clots? (or: recent major surgery, recent prolonged travel, bedridden)  yes 10. OTHER SYMPTOMS: Do you have any other symptoms? (e.g., runny nose, wheezing, chest pain)       Chest tightness 12. TRAVEL: Have you traveled out of the country in the last month? (e.g., travel history, exposures)       no  Protocols used: Cough  - Acute Productive-A-AH

## 2024-01-12 ENCOUNTER — Ambulatory Visit: Admitting: Pulmonary Disease

## 2024-01-12 ENCOUNTER — Other Ambulatory Visit: Payer: Self-pay

## 2024-01-12 ENCOUNTER — Encounter: Payer: Self-pay | Admitting: Pulmonary Disease

## 2024-01-12 VITALS — BP 119/72 | HR 85 | Ht 63.0 in | Wt 106.4 lb

## 2024-01-12 DIAGNOSIS — F17210 Nicotine dependence, cigarettes, uncomplicated: Secondary | ICD-10-CM

## 2024-01-12 DIAGNOSIS — Z87891 Personal history of nicotine dependence: Secondary | ICD-10-CM

## 2024-01-12 DIAGNOSIS — R634 Abnormal weight loss: Secondary | ICD-10-CM | POA: Diagnosis not present

## 2024-01-12 DIAGNOSIS — J441 Chronic obstructive pulmonary disease with (acute) exacerbation: Secondary | ICD-10-CM

## 2024-01-12 DIAGNOSIS — Z122 Encounter for screening for malignant neoplasm of respiratory organs: Secondary | ICD-10-CM

## 2024-01-12 MED ORDER — AZITHROMYCIN 250 MG PO TABS: ORAL_TABLET | ORAL | 0 refills | Status: AC

## 2024-01-12 MED ORDER — PREDNISONE 10 MG PO TABS: ORAL_TABLET | ORAL | 0 refills | Status: AC

## 2024-01-12 MED ORDER — BREZTRI AEROSPHERE 160-9-4.8 MCG/ACT IN AERO: 2.0000 | INHALATION_SPRAY | Freq: Two times a day (BID) | RESPIRATORY_TRACT | 6 refills | Status: AC

## 2024-01-12 MED ORDER — ALBUTEROL SULFATE HFA 108 (90 BASE) MCG/ACT IN AERS: 2.0000 | INHALATION_SPRAY | Freq: Four times a day (QID) | RESPIRATORY_TRACT | 11 refills | Status: AC | PRN

## 2024-01-12 NOTE — Assessment & Plan Note (Signed)
  Orders:   predniSONE  (DELTASONE ) 10 MG tablet; Take 4 tablets (40 mg total) by mouth daily with breakfast for 3 days, THEN 3 tablets (30 mg total) daily with breakfast for 3 days, THEN 2 tablets (20 mg total) daily with breakfast for 3 days, THEN 1 tablet (10 mg total) daily with breakfast for 3 days.   azithromycin  (ZITHROMAX ) 250 MG tablet; Take as directed   budesonide -glycopyrrolate-formoterol (BREZTRI  AEROSPHERE) 160-9-4.8 MCG/ACT AERO inhaler; Inhale 2 puffs into the lungs in the morning and at bedtime.   albuterol  (VENTOLIN  HFA) 108 (90 Base) MCG/ACT inhaler; Inhale 2 puffs into the lungs every 6 (six) hours as needed for wheezing or shortness of breath.

## 2024-01-12 NOTE — Progress Notes (Unsigned)
 Established Patient Pulmonology Office Visit   Subjective:  Patient ID: Kimberly Kemp, female    DOB: [birth year]  MRN: [id number]  CC:  Chief Complaint  Patient presents with   Medical Management of Chronic Issues    Acute- yellowish sputum coming, up lungs hurt she wants to be recheck / states when she eats it gets stuck takes her air away     Discussed the use of AI scribe software for clinical note transcription with the patient, who gave verbal consent to proceed.  History of Present Illness       {PULM QUESTIONNAIRES (Optional):33196}  Review of Systems  Constitutional:  Positive for diaphoresis.  Respiratory:  Positive for cough, shortness of breath and wheezing.     {History (Optional):23778}  Current Outpatient Medications:    acetaminophen -codeine  (TYLENOL  #3) 300-30 MG tablet, Take 1-2 tablets by mouth every 4 (four) hours as needed for moderate pain., Disp: 30 tablet, Rfl: 0   albuterol  (VENTOLIN  HFA) 108 (90 Base) MCG/ACT inhaler, Inhale 2 puffs into the lungs every 6 (six) hours as needed for wheezing or shortness of breath., Disp: 18 each, Rfl: 0   aspirin  EC 81 MG tablet, Take 81 mg by mouth daily., Disp: , Rfl:    budesonide  (PULMICORT ) 0.25 MG/2ML nebulizer solution, Take 2 mLs (0.25 mg total) by nebulization in the morning and at bedtime., Disp: 360 mL, Rfl: 0   butalbital -acetaminophen -caffeine  (FIORICET , ESGIC ) 50-325-40 MG tablet, Take 1 tablet by mouth 3 (three) times daily as needed for headache., Disp: , Rfl: 2   cetirizine (ZYRTEC) 10 MG tablet, Take 10 mg by mouth daily., Disp: , Rfl:    clonazePAM  (KLONOPIN ) 0.5 MG tablet, Take 1 tablet (0.5 mg total) by mouth 2 (two) times daily as needed for anxiety., Disp: 30 tablet, Rfl: 1   diltiazem  (CARDIZEM  CD) 240 MG 24 hr capsule, Take 1 capsule (240 mg total) by mouth daily., Disp: 90 capsule, Rfl: 3   ezetimibe  (ZETIA ) 10 MG tablet, TAKE 1 TABLET BY MOUTH EVERY DAY, Disp: 90 tablet, Rfl: 3   famotidine   (PEPCID ) 40 MG tablet, Take 1 tablet (40 mg total) by mouth at bedtime., Disp: 60 tablet, Rfl: 3   gabapentin  (NEURONTIN ) 300 MG capsule, Take 1 capsule (300 mg total) by mouth at bedtime., Disp: 30 capsule, Rfl: 1   ipratropium-albuterol  (DUONEB) 0.5-2.5 (3) MG/3ML SOLN, Take 3 mLs by nebulization every 6 (six) hours as needed., Disp: 360 mL, Rfl: 1   lidocaine  (LIDODERM ) 5 %, Place 1 patch onto the skin daily. Remove & Discard patch within 12 hours or as directed by MD, Disp: 30 patch, Rfl: 0   magic mouthwash w/lidocaine  SOLN, Take 10 mLs by mouth 4 (four) times daily as needed for mouth pain., Disp: 120 mL, Rfl: 0   metoprolol  tartrate (LOPRESSOR ) 25 MG tablet, Take 1 tablet (25 mg total) by mouth 2 (two) times daily., Disp: 180 tablet, Rfl: 3   montelukast  (SINGULAIR ) 10 MG tablet, Take 1 tablet (10 mg total) by mouth at bedtime., Disp: 90 tablet, Rfl: 0   montelukast  (SINGULAIR ) 10 MG tablet, TAKE 1 TABLET BY MOUTH EVERYDAY AT BEDTIME, Disp: 90 tablet, Rfl: 0   nitroGLYCERIN  (NITROSTAT ) 0.4 MG SL tablet, PLACE 1 TABLET UNDER THE TONGUE EVERY 5 (FIVE) MINUTES AS NEEDED FOR CHEST PAIN. MAXIMUM OF 3 DOSES., Disp: 75 tablet, Rfl: 1   nystatin  (MYCOSTATIN ) 100000 UNIT/ML suspension, Take 5 mLs (500,000 Units total) by mouth 4 (four) times daily., Disp: 60 mL, Rfl:  0   Omega-3 Fatty Acids (FISH OIL PO), Take 1 capsule by mouth 2 (two) times daily., Disp: , Rfl:    omeprazole  (PRILOSEC) 40 MG capsule, TAKE 1 CAPSULE BY MOUTH EVERY DAY, Disp: 90 capsule, Rfl: 3   PAZEO 0.7 % SOLN, Place 1 drop into both eyes daily., Disp: , Rfl:    pramipexole  (MIRAPEX ) 1 MG tablet, TAKE 1 TABLET BY MOUTH AT BEDTIME., Disp: 90 tablet, Rfl: 0   predniSONE  (STERAPRED UNI-PAK 48 TAB) 10 MG (48) TBPK tablet, Take by mouth daily. 12-day taper pack, use as directed for taper, Disp: 1 each, Rfl: 0   promethazine  (PHENERGAN ) 25 MG tablet, Take 1 tablet (25 mg total) by mouth every 4 (four) hours as needed., Disp: 30 tablet,  Rfl: 0   promethazine -dextromethorphan  (PROMETHAZINE -DM) 6.25-15 MG/5ML syrup, TAKE 5 MLS BY MOUTH 4 TIMES A DAY AS NEEDED FOR COUGH, Disp: 240 mL, Rfl: 0   rosuvastatin  (CRESTOR ) 40 MG tablet, Take 1 tablet (40 mg total) by mouth daily., Disp: 90 tablet, Rfl: 1   sulfamethoxazole -trimethoprim  (BACTRIM  DS) 800-160 MG tablet, Take 1 tablet by mouth 2 (two) times daily., Disp: 20 tablet, Rfl: 0   TIADYLT  ER 180 MG 24 hr capsule, Take 180 mg by mouth daily., Disp: , Rfl:       Objective:  BP 119/72   Pulse 85   Ht 5' 3 (1.6 m) Comment: per pt  Wt 106 lb 6.4 oz (48.3 kg)   LMP 07/18/2016   SpO2 98%   BMI 18.85 kg/m   {Pulm Vitals (Optional):32837}  Physical Exam   Diagnostic Review:  {Labs (Optional):32838}     Assessment & Plan:   Assessment & Plan COPD exacerbation (HCC)  Orders:   predniSONE  (DELTASONE ) 10 MG tablet; Take 4 tablets (40 mg total) by mouth daily with breakfast for 3 days, THEN 3 tablets (30 mg total) daily with breakfast for 3 days, THEN 2 tablets (20 mg total) daily with breakfast for 3 days, THEN 1 tablet (10 mg total) daily with breakfast for 3 days.   azithromycin  (ZITHROMAX ) 250 MG tablet; Take as directed   budesonide -glycopyrrolate-formoterol (BREZTRI  AEROSPHERE) 160-9-4.8 MCG/ACT AERO inhaler; Inhale 2 puffs into the lungs in the morning and at bedtime.   albuterol  (VENTOLIN  HFA) 108 (90 Base) MCG/ACT inhaler; Inhale 2 puffs into the lungs every 6 (six) hours as needed for wheezing or shortness of breath.  Cigarette smoker  Orders:   Ambulatory Referral for Lung Cancer Scre   Assessment and Plan Assessment & Plan       No follow-ups on file.   Dorn KATHEE Chill, MD

## 2024-01-12 NOTE — Patient Instructions (Addendum)
 Start breztri  inhaler 2 puffs twice daily - rinse mouth out after each use  Use albuterol  inhaler 1-2 puffs every 4-6 hours  Start prednisone  taper:  40mg  daily x 3 days 30mg  daily x 3 days 20mg  daily x 3 days 10mg  daily x 3 days  Start Zpak antibiotic  Consider quitting smoking use nicotine patches 14mg  daily and use 2mg  mini nicotine lozenges as needed for break through cravings  Follow up in 3 months, call sooner if needed

## 2024-01-17 ENCOUNTER — Other Ambulatory Visit: Payer: Self-pay | Admitting: Primary Care

## 2024-01-19 ENCOUNTER — Telehealth: Payer: Self-pay | Admitting: Acute Care

## 2024-01-19 NOTE — Telephone Encounter (Signed)
 Returned call from VM for LDCT appt.  No answer. Left vm.  Called number provider on Vm which is [phone].   Requested pt call for scheduling at [phone]

## 2024-01-21 ENCOUNTER — Encounter: Payer: Self-pay | Admitting: *Deleted

## 2024-01-21 NOTE — Progress Notes (Signed)
 Kimberly Kemp                                          MRN: [id number]   01/21/2024   The [facility] Quality Team Specialist reviewed this patient medical record for the purposes of chart review for care gap closure. The following were reviewed: chart review for care gap closure-breast cancer screening.    [facility] Quality Team

## 2024-01-22 ENCOUNTER — Encounter: Payer: Self-pay | Admitting: Pulmonary Disease

## 2024-02-01 ENCOUNTER — Other Ambulatory Visit: Payer: Self-pay | Admitting: Family Medicine

## 2024-02-01 DIAGNOSIS — G2581 Restless legs syndrome: Secondary | ICD-10-CM

## 2024-02-01 DIAGNOSIS — F419 Anxiety disorder, unspecified: Secondary | ICD-10-CM

## 2024-02-01 DIAGNOSIS — B370 Candidal stomatitis: Secondary | ICD-10-CM

## 2024-02-01 DIAGNOSIS — K219 Gastro-esophageal reflux disease without esophagitis: Secondary | ICD-10-CM

## 2024-02-01 DIAGNOSIS — R110 Nausea: Secondary | ICD-10-CM

## 2024-02-01 DIAGNOSIS — E78019 Familial hypercholesterolemia, unspecified: Secondary | ICD-10-CM

## 2024-02-01 NOTE — Telephone Encounter (Unsigned)
 Copied from CRM #[id number]. Topic: Clinical - Medication Refill >> Feb 01, 2024 10:28 AM Viola F wrote: Medication: clonazePAM  (KLONOPIN ) 0.5 MG tablet [[id number]] famotidine  (PEPCID ) 40 MG tablet [[id number]] gabapentin  (NEURONTIN ) 300 MG capsule [[id number]] montelukast  (SINGULAIR ) 10 MG tablet [[id number]] nystatin  (MYCOSTATIN ) 100000 UNIT/ML suspension [[id number]] omeprazole  (PRILOSEC) 40 MG capsule [[id number]] pramipexole  (MIRAPEX ) 1 MG tablet [[id number]] promethazine  (PHENERGAN ) 25 MG tablet [[id number]] rosuvastatin  (CRESTOR ) 40 MG tablet [[id number]]  Has the patient contacted their pharmacy? No (Agent: If no, request that the patient contact the pharmacy for the refill. If patient does not wish to contact the pharmacy document the reason why and proceed with request.) (Agent: If yes, when and what did the pharmacy advise?)  This is the patient's preferred pharmacy:   [id number] - , [place] - [address] [[id number]]  Is this the correct pharmacy for this prescription? Yes If no, delete pharmacy and type the correct one.   Has the prescription been filled recently? Yes  Is the patient out of the medication? Yes  Has the patient been seen for an appointment in the last year OR does the patient have an upcoming appointment? Yes  Can we respond through MyChart? Yes  Agent: Please be advised that Rx refills may take up to 3 business days. We ask that you follow-up with your pharmacy.

## 2024-02-09 ENCOUNTER — Telehealth: Payer: Self-pay | Admitting: Family Medicine

## 2024-02-09 ENCOUNTER — Other Ambulatory Visit: Payer: Self-pay | Admitting: Family Medicine

## 2024-02-09 DIAGNOSIS — F419 Anxiety disorder, unspecified: Secondary | ICD-10-CM

## 2024-02-09 MED ORDER — CLONAZEPAM 0.5 MG PO TABS: 0.5000 mg | ORAL_TABLET | Freq: Two times a day (BID) | ORAL | 1 refills | Status: DC | PRN

## 2024-02-09 NOTE — Telephone Encounter (Signed)
 Copied from CRM [id number]. Topic: Clinical - Medication Refill >> Feb 09, 2024  1:18 PM Harlene ORN wrote: Medication: clonazePAM  (KLONOPIN ) 0.5 MG tablet  Has the patient contacted their pharmacy? Yes (Agent: If no, request that the patient contact the pharmacy for the refill. If patient does not wish to contact the pharmacy document the reason why and proceed with request.) (Agent: If yes, when and what did the pharmacy advise?)  This is the patient's preferred pharmacy:   [id number] [place], [place] - [address] [place] [address] Phone: [phone] Fax: [phone]  Is this the correct pharmacy for this prescription? Yes If no, delete pharmacy and type the correct one.   Has the prescription been filled recently? No  Is the patient out of the medication? Yes  Has the patient been seen for an appointment in the last year OR does the patient have an upcoming appointment? Yes  Can we respond through MyChart? No  Agent: Please be advised that Rx refills may take up to 3 business days. We ask that you follow-up with your pharmacy.

## 2024-02-09 NOTE — Telephone Encounter (Signed)
 Will fill clonazepam  for 3 months.

## 2024-02-10 ENCOUNTER — Other Ambulatory Visit: Payer: Self-pay | Admitting: Family Medicine

## 2024-02-10 DIAGNOSIS — F419 Anxiety disorder, unspecified: Secondary | ICD-10-CM

## 2024-02-10 NOTE — Telephone Encounter (Signed)
 Copied from CRM [id number]. Topic: Clinical - Prescription Issue >> Feb 10, 2024 12:10 PM Viola F wrote: Reason for CRM: Patient needs the clonazePAM  (KLONOPIN ) 0.5 MG tablet resent to the [id number] - [place], [place] - [address] - it was sent to the incorrect pharmacy. Please advise.

## 2024-02-11 MED ORDER — CLONAZEPAM 0.5 MG PO TABS: 0.5000 mg | ORAL_TABLET | Freq: Two times a day (BID) | ORAL | 1 refills | Status: AC | PRN

## 2024-02-14 ENCOUNTER — Other Ambulatory Visit: Payer: Self-pay | Admitting: Primary Care

## 2024-02-16 ENCOUNTER — Ambulatory Visit: Admitting: Family Medicine

## 2024-02-16 ENCOUNTER — Other Ambulatory Visit: Payer: Self-pay | Admitting: Family Medicine

## 2024-02-16 MED ORDER — BUTALBITAL-APAP-CAFFEINE 50-325-40 MG PO TABS: 1.0000 | ORAL_TABLET | Freq: Three times a day (TID) | ORAL | 2 refills | Status: AC | PRN

## 2024-02-16 NOTE — Telephone Encounter (Unsigned)
 Copied from CRM #[id number]. Topic: Clinical - Medication Refill >> Feb 16, 2024  8:59 AM Montie POUR wrote: Medication:  butalbital -acetaminophen -caffeine  (FIORICET , ESGIC ) 50-325-40 MG tablet  Has the patient contacted their pharmacy? Yes (Agent: If no, request that the patient contact the pharmacy for the refill. If patient does not wish to contact the pharmacy document the reason why and proceed with request.) (Agent: If yes, when and what did the pharmacy advise?) Pharmacy needs order to refill  This is the patient's preferred pharmacy:  [facility] [address], [place] - [address] [place] [address] Phone: [phone] Fax: [phone]  Is this the correct pharmacy for this prescription? Yes If no, delete pharmacy and type the correct one.   Has the prescription been filled recently? No  Is the patient out of the medication? Yes - She has been out medication for a month  Has the patient been seen for an appointment in the last year OR does the patient have an upcoming appointment? Yes  Can we respond through MyChart? Yes  Agent: Please be advised that Rx refills may take up to 3 business days. We ask that you follow-up with your pharmacy.

## 2024-02-22 ENCOUNTER — Other Ambulatory Visit: Payer: Self-pay | Admitting: Family Medicine

## 2024-02-22 DIAGNOSIS — K219 Gastro-esophageal reflux disease without esophagitis: Secondary | ICD-10-CM

## 2024-03-06 ENCOUNTER — Other Ambulatory Visit: Payer: Self-pay | Admitting: Family Medicine

## 2024-03-06 DIAGNOSIS — E78019 Familial hypercholesterolemia, unspecified: Secondary | ICD-10-CM

## 2024-03-06 DIAGNOSIS — G2581 Restless legs syndrome: Secondary | ICD-10-CM
